# Patient Record
Sex: Female | Born: 1935 | Race: White | Hispanic: No | Smoking: Never smoker
Health system: Southern US, Community
[De-identification: ages and names within clinical notes are randomized; demographics above are authoritative.]

## PROBLEM LIST (undated history)

## (undated) DIAGNOSIS — G459 Transient cerebral ischemic attack, unspecified: Secondary | ICD-10-CM

## (undated) DIAGNOSIS — F32A Depression, unspecified: Secondary | ICD-10-CM

## (undated) DIAGNOSIS — R51 Headache: Secondary | ICD-10-CM

## (undated) DIAGNOSIS — H409 Unspecified glaucoma: Secondary | ICD-10-CM

## (undated) DIAGNOSIS — R413 Other amnesia: Secondary | ICD-10-CM

## (undated) DIAGNOSIS — K219 Gastro-esophageal reflux disease without esophagitis: Secondary | ICD-10-CM

## (undated) DIAGNOSIS — M1990 Unspecified osteoarthritis, unspecified site: Secondary | ICD-10-CM

## (undated) DIAGNOSIS — F419 Anxiety disorder, unspecified: Secondary | ICD-10-CM

## (undated) DIAGNOSIS — R269 Unspecified abnormalities of gait and mobility: Secondary | ICD-10-CM

## (undated) DIAGNOSIS — E669 Obesity, unspecified: Secondary | ICD-10-CM

## (undated) DIAGNOSIS — N809 Endometriosis, unspecified: Secondary | ICD-10-CM

## (undated) DIAGNOSIS — J45909 Unspecified asthma, uncomplicated: Secondary | ICD-10-CM

## (undated) DIAGNOSIS — H3530 Unspecified macular degeneration: Secondary | ICD-10-CM

## (undated) DIAGNOSIS — F329 Major depressive disorder, single episode, unspecified: Secondary | ICD-10-CM

## (undated) DIAGNOSIS — G20A1 Parkinson's disease without dyskinesia, without mention of fluctuations: Secondary | ICD-10-CM

## (undated) DIAGNOSIS — G20 Parkinson's disease: Secondary | ICD-10-CM

## (undated) DIAGNOSIS — I679 Cerebrovascular disease, unspecified: Secondary | ICD-10-CM

## (undated) DIAGNOSIS — I10 Essential (primary) hypertension: Secondary | ICD-10-CM

## (undated) DIAGNOSIS — E785 Hyperlipidemia, unspecified: Secondary | ICD-10-CM

## (undated) HISTORY — DX: Endometriosis, unspecified: N80.9

## (undated) HISTORY — DX: Unspecified glaucoma: H40.9

## (undated) HISTORY — DX: Parkinson's disease: G20

## (undated) HISTORY — PX: ABDOMINAL HYSTERECTOMY: SHX81

## (undated) HISTORY — DX: Major depressive disorder, single episode, unspecified: F32.9

## (undated) HISTORY — PX: CATARACT EXTRACTION, BILATERAL: SHX1313

## (undated) HISTORY — DX: Unspecified macular degeneration: H35.30

## (undated) HISTORY — PX: GALLBLADDER SURGERY: SHX652

## (undated) HISTORY — DX: Cerebrovascular disease, unspecified: I67.9

## (undated) HISTORY — DX: Hyperlipidemia, unspecified: E78.5

## (undated) HISTORY — DX: Gastro-esophageal reflux disease without esophagitis: K21.9

## (undated) HISTORY — DX: Obesity, unspecified: E66.9

## (undated) HISTORY — DX: Other amnesia: R41.3

## (undated) HISTORY — DX: Parkinson's disease without dyskinesia, without mention of fluctuations: G20.A1

## (undated) HISTORY — DX: Unspecified abnormalities of gait and mobility: R26.9

## (undated) HISTORY — PX: OTHER SURGICAL HISTORY: SHX169

## (undated) HISTORY — DX: Depression, unspecified: F32.A

## (undated) HISTORY — PX: TONSILLECTOMY: SUR1361

## (undated) HISTORY — DX: Transient cerebral ischemic attack, unspecified: G45.9

---

## 1998-10-04 ENCOUNTER — Ambulatory Visit (HOSPITAL_COMMUNITY): Admission: RE | Admit: 1998-10-04 | Discharge: 1998-10-04 | Payer: Self-pay | Admitting: Internal Medicine

## 1998-10-04 ENCOUNTER — Encounter: Payer: Self-pay | Admitting: Internal Medicine

## 1998-10-11 ENCOUNTER — Encounter: Payer: Self-pay | Admitting: Surgery

## 1998-10-11 ENCOUNTER — Ambulatory Visit (HOSPITAL_COMMUNITY): Admission: RE | Admit: 1998-10-11 | Discharge: 1998-10-11 | Payer: Self-pay | Admitting: Surgery

## 1998-10-14 ENCOUNTER — Encounter: Payer: Self-pay | Admitting: Surgery

## 1998-10-14 ENCOUNTER — Observation Stay (HOSPITAL_COMMUNITY): Admission: RE | Admit: 1998-10-14 | Discharge: 1998-10-15 | Payer: Self-pay | Admitting: Surgery

## 1998-11-01 ENCOUNTER — Emergency Department (HOSPITAL_COMMUNITY): Admission: EM | Admit: 1998-11-01 | Discharge: 1998-11-01 | Payer: Self-pay | Admitting: Emergency Medicine

## 2000-08-04 ENCOUNTER — Encounter: Payer: Self-pay | Admitting: Internal Medicine

## 2000-08-04 ENCOUNTER — Ambulatory Visit (HOSPITAL_COMMUNITY): Admission: RE | Admit: 2000-08-04 | Discharge: 2000-08-04 | Payer: Self-pay | Admitting: Internal Medicine

## 2000-11-11 ENCOUNTER — Ambulatory Visit (HOSPITAL_COMMUNITY): Admission: RE | Admit: 2000-11-11 | Discharge: 2000-11-11 | Payer: Self-pay | Admitting: Chiropractic Medicine

## 2000-11-11 ENCOUNTER — Encounter: Payer: Self-pay | Admitting: Chiropractic Medicine

## 2001-01-05 ENCOUNTER — Encounter: Payer: Self-pay | Admitting: Neurosurgery

## 2001-01-05 ENCOUNTER — Ambulatory Visit (HOSPITAL_COMMUNITY): Admission: RE | Admit: 2001-01-05 | Discharge: 2001-01-05 | Payer: Self-pay | Admitting: Neurosurgery

## 2001-01-19 ENCOUNTER — Ambulatory Visit (HOSPITAL_COMMUNITY): Admission: RE | Admit: 2001-01-19 | Discharge: 2001-01-19 | Payer: Self-pay | Admitting: Neurosurgery

## 2001-01-19 ENCOUNTER — Encounter: Payer: Self-pay | Admitting: Neurosurgery

## 2001-02-25 ENCOUNTER — Encounter (HOSPITAL_COMMUNITY): Admission: RE | Admit: 2001-02-25 | Discharge: 2001-05-26 | Payer: Self-pay | Admitting: Internal Medicine

## 2001-07-22 ENCOUNTER — Encounter: Admission: RE | Admit: 2001-07-22 | Discharge: 2001-07-22 | Payer: Self-pay | Admitting: Internal Medicine

## 2001-07-22 ENCOUNTER — Encounter: Payer: Self-pay | Admitting: Internal Medicine

## 2001-08-05 ENCOUNTER — Encounter: Payer: Self-pay | Admitting: Internal Medicine

## 2001-08-05 ENCOUNTER — Ambulatory Visit (HOSPITAL_COMMUNITY): Admission: RE | Admit: 2001-08-05 | Discharge: 2001-08-05 | Payer: Self-pay | Admitting: Internal Medicine

## 2001-12-13 ENCOUNTER — Encounter: Payer: Self-pay | Admitting: Internal Medicine

## 2001-12-13 ENCOUNTER — Encounter: Admission: RE | Admit: 2001-12-13 | Discharge: 2001-12-13 | Payer: Self-pay | Admitting: Internal Medicine

## 2002-06-15 ENCOUNTER — Ambulatory Visit (HOSPITAL_COMMUNITY): Admission: RE | Admit: 2002-06-15 | Discharge: 2002-06-15 | Payer: Self-pay | Admitting: Internal Medicine

## 2002-06-15 ENCOUNTER — Encounter: Payer: Self-pay | Admitting: Internal Medicine

## 2003-10-29 ENCOUNTER — Ambulatory Visit (HOSPITAL_COMMUNITY): Admission: RE | Admit: 2003-10-29 | Discharge: 2003-10-29 | Payer: Self-pay | Admitting: Internal Medicine

## 2003-11-06 ENCOUNTER — Encounter: Admission: RE | Admit: 2003-11-06 | Discharge: 2003-11-06 | Payer: Self-pay | Admitting: Internal Medicine

## 2003-11-15 ENCOUNTER — Ambulatory Visit (HOSPITAL_COMMUNITY): Admission: RE | Admit: 2003-11-15 | Discharge: 2003-11-15 | Payer: Self-pay | Admitting: Internal Medicine

## 2004-04-10 ENCOUNTER — Inpatient Hospital Stay (HOSPITAL_COMMUNITY): Admission: EM | Admit: 2004-04-10 | Discharge: 2004-04-14 | Payer: Self-pay | Admitting: Emergency Medicine

## 2004-04-14 ENCOUNTER — Inpatient Hospital Stay (HOSPITAL_COMMUNITY): Admission: RE | Admit: 2004-04-14 | Discharge: 2004-04-18 | Payer: Self-pay | Admitting: Psychiatry

## 2004-04-21 ENCOUNTER — Other Ambulatory Visit (HOSPITAL_COMMUNITY): Admission: RE | Admit: 2004-04-21 | Discharge: 2004-04-21 | Payer: Self-pay | Admitting: Psychiatry

## 2004-10-15 ENCOUNTER — Ambulatory Visit (HOSPITAL_COMMUNITY): Admission: RE | Admit: 2004-10-15 | Discharge: 2004-10-15 | Payer: Self-pay | Admitting: Internal Medicine

## 2004-10-18 ENCOUNTER — Inpatient Hospital Stay (HOSPITAL_COMMUNITY): Admission: EM | Admit: 2004-10-18 | Discharge: 2004-10-20 | Payer: Self-pay

## 2004-12-10 ENCOUNTER — Ambulatory Visit (HOSPITAL_COMMUNITY): Admission: RE | Admit: 2004-12-10 | Discharge: 2004-12-10 | Payer: Self-pay | Admitting: Internal Medicine

## 2005-02-09 ENCOUNTER — Emergency Department (HOSPITAL_COMMUNITY): Admission: EM | Admit: 2005-02-09 | Discharge: 2005-02-09 | Payer: Self-pay | Admitting: Emergency Medicine

## 2005-07-08 ENCOUNTER — Encounter: Admission: RE | Admit: 2005-07-08 | Discharge: 2005-10-06 | Payer: Self-pay | Admitting: Neurology

## 2005-07-16 ENCOUNTER — Ambulatory Visit (HOSPITAL_COMMUNITY): Admission: RE | Admit: 2005-07-16 | Discharge: 2005-07-16 | Payer: Self-pay | Admitting: Internal Medicine

## 2005-10-05 ENCOUNTER — Encounter: Admission: RE | Admit: 2005-10-05 | Discharge: 2005-10-05 | Payer: Self-pay | Admitting: Neurology

## 2006-03-01 ENCOUNTER — Emergency Department (HOSPITAL_COMMUNITY): Admission: EM | Admit: 2006-03-01 | Discharge: 2006-03-01 | Payer: Self-pay | Admitting: Emergency Medicine

## 2006-11-12 ENCOUNTER — Ambulatory Visit (HOSPITAL_COMMUNITY): Admission: RE | Admit: 2006-11-12 | Discharge: 2006-11-12 | Payer: Self-pay | Admitting: Internal Medicine

## 2007-06-04 ENCOUNTER — Encounter: Admission: RE | Admit: 2007-06-04 | Discharge: 2007-06-04 | Payer: Self-pay | Admitting: Neurology

## 2007-12-09 ENCOUNTER — Ambulatory Visit (HOSPITAL_COMMUNITY): Admission: RE | Admit: 2007-12-09 | Discharge: 2007-12-09 | Payer: Self-pay | Admitting: Family Medicine

## 2008-03-03 ENCOUNTER — Emergency Department (HOSPITAL_COMMUNITY): Admission: EM | Admit: 2008-03-03 | Discharge: 2008-03-03 | Payer: Self-pay | Admitting: Emergency Medicine

## 2008-12-19 ENCOUNTER — Ambulatory Visit (HOSPITAL_COMMUNITY): Admission: RE | Admit: 2008-12-19 | Discharge: 2008-12-19 | Payer: Self-pay | Admitting: Family Medicine

## 2010-01-12 ENCOUNTER — Emergency Department (HOSPITAL_COMMUNITY): Admission: EM | Admit: 2010-01-12 | Discharge: 2010-01-12 | Payer: Self-pay | Admitting: Emergency Medicine

## 2010-01-13 ENCOUNTER — Encounter: Admission: RE | Admit: 2010-01-13 | Discharge: 2010-01-13 | Payer: Self-pay | Admitting: Internal Medicine

## 2010-01-14 ENCOUNTER — Inpatient Hospital Stay (HOSPITAL_COMMUNITY): Admission: EM | Admit: 2010-01-14 | Discharge: 2010-01-17 | Payer: Self-pay | Admitting: Emergency Medicine

## 2010-01-15 ENCOUNTER — Encounter (INDEPENDENT_AMBULATORY_CARE_PROVIDER_SITE_OTHER): Payer: Self-pay | Admitting: Internal Medicine

## 2010-01-15 ENCOUNTER — Ambulatory Visit: Payer: Self-pay | Admitting: Vascular Surgery

## 2010-01-16 ENCOUNTER — Encounter (INDEPENDENT_AMBULATORY_CARE_PROVIDER_SITE_OTHER): Payer: Self-pay | Admitting: Internal Medicine

## 2010-11-22 ENCOUNTER — Encounter: Payer: Self-pay | Admitting: Internal Medicine

## 2010-11-23 ENCOUNTER — Encounter: Payer: Self-pay | Admitting: Internal Medicine

## 2011-01-25 LAB — CK TOTAL AND CKMB (NOT AT ARMC)
CK, MB: 2.6 ng/mL (ref 0.3–4.0)
Relative Index: 1.4 (ref 0.0–2.5)
Relative Index: 1.7 (ref 0.0–2.5)

## 2011-01-25 LAB — URINALYSIS, ROUTINE W REFLEX MICROSCOPIC
Bilirubin Urine: NEGATIVE
Leukocytes, UA: NEGATIVE
Nitrite: NEGATIVE
Specific Gravity, Urine: 1.02 (ref 1.005–1.030)
Urobilinogen, UA: 0.2 mg/dL (ref 0.0–1.0)

## 2011-01-25 LAB — PROTIME-INR
INR: 1.01 (ref 0.00–1.49)
Prothrombin Time: 13.2 seconds (ref 11.6–15.2)

## 2011-01-25 LAB — CBC
HCT: 34.7 % — ABNORMAL LOW (ref 36.0–46.0)
MCV: 92.3 fL (ref 78.0–100.0)
Platelets: 323 10*3/uL (ref 150–400)
Platelets: 343 10*3/uL (ref 150–400)
RBC: 3.9 MIL/uL (ref 3.87–5.11)
RDW: 13.5 % (ref 11.5–15.5)
RDW: 13.9 % (ref 11.5–15.5)
RDW: 13.9 % (ref 11.5–15.5)
WBC: 6.8 10*3/uL (ref 4.0–10.5)
WBC: 6.8 10*3/uL (ref 4.0–10.5)

## 2011-01-25 LAB — DIFFERENTIAL
Basophils Absolute: 0 10*3/uL (ref 0.0–0.1)
Basophils Relative: 1 % (ref 0–1)
Eosinophils Absolute: 0.2 10*3/uL (ref 0.0–0.7)
Eosinophils Relative: 3 % (ref 0–5)
Lymphs Abs: 1.7 10*3/uL (ref 0.7–4.0)
Monocytes Absolute: 0.3 10*3/uL (ref 0.1–1.0)
Monocytes Relative: 5 % (ref 3–12)

## 2011-01-25 LAB — COMPREHENSIVE METABOLIC PANEL
ALT: 20 U/L (ref 0–35)
ALT: 24 U/L (ref 0–35)
AST: 29 U/L (ref 0–37)
AST: 31 U/L (ref 0–37)
Albumin: 3.7 g/dL (ref 3.5–5.2)
BUN: 17 mg/dL (ref 6–23)
BUN: 21 mg/dL (ref 6–23)
Calcium: 9.7 mg/dL (ref 8.4–10.5)
Chloride: 106 mEq/L (ref 96–112)
Chloride: 107 mEq/L (ref 96–112)
Creatinine, Ser: 1.18 mg/dL (ref 0.4–1.2)
Creatinine, Ser: 1.23 mg/dL — ABNORMAL HIGH (ref 0.4–1.2)
Creatinine, Ser: 1.29 mg/dL — ABNORMAL HIGH (ref 0.4–1.2)
GFR calc non Af Amer: 40 mL/min — ABNORMAL LOW (ref >60)
GFR calc non Af Amer: 44 mL/min — ABNORMAL LOW (ref >60)
Glucose, Bld: 116 mg/dL — ABNORMAL HIGH (ref 70–99)
Potassium: 3.7 mEq/L (ref 3.5–5.1)
Potassium: 3.9 mEq/L (ref 3.5–5.1)
Potassium: 4.2 mEq/L (ref 3.5–5.1)
Sodium: 140 mEq/L (ref 135–145)
Sodium: 141 mEq/L (ref 135–145)
Total Bilirubin: 0.8 mg/dL (ref 0.3–1.2)
Total Protein: 6.4 g/dL (ref 6.0–8.3)
Total Protein: 6.7 g/dL (ref 6.0–8.3)

## 2011-01-25 LAB — TROPONIN I
Troponin I: 0.01 ng/mL (ref 0.00–0.06)
Troponin I: 0.01 ng/mL (ref 0.00–0.06)

## 2011-01-25 LAB — LIPID PANEL
LDL Cholesterol: 81 mg/dL (ref 0–99)
Total CHOL/HDL Ratio: 3.6 RATIO
Triglycerides: 108 mg/dL (ref <150)
VLDL: 22 mg/dL (ref 0–40)

## 2011-01-25 LAB — URINE CULTURE

## 2011-01-25 LAB — POCT CARDIAC MARKERS

## 2011-01-25 LAB — CARDIAC PANEL(CRET KIN+CKTOT+MB+TROPI)
Relative Index: 1.5 (ref 0.0–2.5)
Relative Index: 1.6 (ref 0.0–2.5)
Total CK: 131 U/L (ref 7–177)
Troponin I: 0.01 ng/mL (ref 0.00–0.06)

## 2011-01-25 LAB — BASIC METABOLIC PANEL
Chloride: 110 mEq/L (ref 96–112)
GFR calc Af Amer: >60 mL/min (ref >60)
Potassium: 3.4 mEq/L — ABNORMAL LOW (ref 3.5–5.1)

## 2011-01-25 LAB — URINE MICROSCOPIC-ADD ON

## 2011-01-25 LAB — APTT: aPTT: 23 seconds — ABNORMAL LOW (ref 24–37)

## 2011-03-02 ENCOUNTER — Other Ambulatory Visit (HOSPITAL_COMMUNITY): Payer: Self-pay | Admitting: Neurology

## 2011-03-02 ENCOUNTER — Other Ambulatory Visit (HOSPITAL_COMMUNITY): Payer: Self-pay

## 2011-03-02 DIAGNOSIS — R531 Weakness: Secondary | ICD-10-CM

## 2011-03-10 ENCOUNTER — Ambulatory Visit (HOSPITAL_COMMUNITY): Payer: Medicare Other | Attending: Neurology

## 2011-03-10 DIAGNOSIS — R5381 Other malaise: Secondary | ICD-10-CM | POA: Insufficient documentation

## 2011-03-10 DIAGNOSIS — G459 Transient cerebral ischemic attack, unspecified: Secondary | ICD-10-CM

## 2011-03-10 DIAGNOSIS — R531 Weakness: Secondary | ICD-10-CM

## 2011-03-11 ENCOUNTER — Encounter (HOSPITAL_COMMUNITY): Payer: Self-pay | Admitting: Neurology

## 2011-03-20 NOTE — Discharge Summary (Signed)
NAME:  [name], [name]                     ACCOUNT NO.:  [id number]   MEDICAL RECORD NO.:  [id number]                   PATIENT TYPE:  IPS   LOCATION:  0508                                 FACILITY:  [facility]   PHYSICIAN:  Geoffery Lyons, M.D.                   DATE OF BIRTH:  12/22/1929   DATE OF ADMISSION:  04/14/2004  DATE OF DISCHARGE:  04/18/2004                                 DISCHARGE SUMMARY   CHIEF COMPLAINT AND PRESENT ILLNESS:  This was the first admission to [facility] for this 77 year old married white female  voluntarily admitted.  She was transferred from [facility].  She spent five  days after intentional overdose on Xanax, her own pills, took 100.  She  stated that she was very impulsive, she acted out of stupidity.  She left  a note to family so I didn't screw up my life.  She has no specific  stressors.  She was hoping to be home with Jesus.  Husband found her.  There  were some financial worries.   PAST PSYCHIATRIC HISTORY:  This was the first time at [facility].  No other treatment.   SUBSTANCE ABUSE HISTORY:  She denied the use or abuse of any substances.   PAST MEDICAL HISTORY:  1. CVA.  2. Arterial hypertension.  3. Asthma.  4. Hypercholesterolemia.   MEDICATIONS:  1. Allegra 60 mg daily.  2. Aspirin 81 mg.  3. Clonidine 0.1 mg.  4. Combivent two puffs twice a day.  5. Lasix 20 mg daily.  6. Plavix 75 mg daily.  7. Zocor 80 mg daily.  8. Xanax 1 mg three times a day for five days.  9. Prilosec 40 mg per day.   PHYSICAL EXAMINATION:  Physical examination was performed, failed to show  any acute findings.   LABORATORY DATA:  Laboratory workup upon transfer was within normal limits.   MENTAL STATUS EXAM:  Mental status exam revealed an alert female,  cooperative.  Speech was clear.  Mood was feeling guilty, ashamed.  Affect  was sad.  Thought processes: Logical, coherent, relevant; no evidence of  delusional  ideas, no evidence of hallucinations, ashamed of what she did, no  active suicidal ideation.  Cognitive: Cognition was well preserved.   ADMISSION DIAGNOSES:   AXIS I:  Major depression.   AXIS II:  No diagnosis.   AXIS III:  1. Status post overdose.  2. Arterial hypertension.  3. Status post cerebral vascular accident.  4. Asthma.  5. Hypercholesterolemia.   AXIS IV:  Moderate.   AXIS V:  Global assessment of functioning upon admission 35, highest global  assessment of functioning in the last year 70.   HOSPITAL COURSE:  She was admitted and started in intensive individual and  group psychotherapy.  She was given Ambien for sleep.  She was maintained on  her medications.  She was  given some Librium 25 mg every six hours as  needed.  Ambien was discontinued and she was started on Lexapro 5 mg per  day.  She endorsed that she did not know why she took the overdose, could  not identify any one stressors.  She endorsed down mood, especially in the  afternoon, difficulty with sleep.  She was being given Xanax three times a  day.  She tried taking it for sleep every night.  She claimed that she would  take it a couple of times a day most of the days.  She felt that she  recovered fully after the CVA, mobility, etc., but she had experienced some  mood swings after the stroke.  She was started on Lexapro 5 mg per day.  She  was stomach sensitive, endorsing some anxiety, possibility of some  withdrawal was considered.  She was placed on taper from Librium.  Family  session she refused to attend.  She reported to be happy at times, depressed  at times, withdrawn at times, outgoing and quiet.  Apparently, the family  felt that she had been unstable and that she would not be compliant if she  did not pursue the Lexapro.  But by June 17, she was better, no further of  the GI symptoms, felt that she would not be progressing as well if she was  going to stay on the unit.  She was willing to  pursue outpatient treatment  but felt that staying on the unit was not going to help her out anymore.  She felt that she got a lot of help but it was time to go.  She endorsed no  suicidal ideas, no homicidal ideations, was willing to pursue further  outpatient treatment as well as possible medication changes.   DISCHARGE DIAGNOSES:   AXIS I:  Depressive disorder, not otherwise specified.   AXIS II:  No diagnosis.   AXIS III:  1. Status post benzodiazepine overdose.  2. Arterial hypertension.  3. Hypercholesterolemia.  4. Asthma.   AXIS IV:  Moderate.   AXIS V:  Global assessment of functioning upon discharge 50.   DISCHARGE MEDICATIONS:  1. Plavix 75 mg per day.  2. Protonix 40 mg per day.  3. Catapres 0.1 mg daily.  4. Lasix 20 mg daily.  5. Combivent inhaler two puffs three times a day.  6. Aspirin 81 mg daily.  7. Allegra 60 mg daily.  8. Zocor 40 mg two daily.  9. Librium taper.  10.      Phenergan 25 mg one half to one every six hours as needed.   FOLLOW UP:  She was to follow up with [facility].                                               Geoffery Lyons, M.D.    IL/MEDQ  D:  05/27/2004  T:  05/28/2004  Job:  [id number]

## 2011-03-20 NOTE — Discharge Summary (Signed)
NAME:  [name], [name]           ACCOUNT NO.:  [id number]   MEDICAL RECORD NO.:  [id number]          PATIENT TYPE:  INP   LOCATION:  3737                         FACILITY:  [facility]   PHYSICIAN:  Michaelyn Barter, M.D. DATE OF BIRTH:  June 21, 1930   DATE OF ADMISSION:  10/17/2004  DATE OF DISCHARGE:  10/20/2004                                 DISCHARGE SUMMARY   FINAL DIAGNOSES AT THE TIME OF DISCHARGE:  1.  Frequent falling.  2.  Hypertension.  3.  Cough.  4.  Sinus disease.   PRIMARY CARE PHYSICIAN:  Lucky Cowboy, M.D.   HISTORY OF PRESENT ILLNESS:  Ms. Sturgess is a 77 year old female who  arrived at the hospital with a chief complaint of multiple falls.  She went  on to state that she had been experiencing progressive weakness and falls  which had been increasing over the last two years, following a previous CVA  that she had experienced.  She otherwise had no complaints.  She denied  feeling weak.  No chest pain, no muscle pain, no numbness or weakness.   PAST MEDICAL HISTORY:  1.  CVA.  2.  Depression.  3.  History of asthma.  4.  Back pain - chronic.  5.  Hypercholesterolemia.  6.  Hypertension.  7.  Xanax overdose for which she ended up being admitted to [facility].  8.  Rib fracture secondary to a recent fall.   PAST SURGICAL HISTORY:  1.  Cholecystectomy.  2.  Hysterectomy.  3.  Cervical disk disease.   SOCIAL HISTORY:  Cigarettes:  The patient denied.  Alcohol:  The patient  denied.   FAMILY HISTORY:  Neurologic and cardiac diseases negative.   ALLERGIES:  No known drug allergies.   HOSPITAL COURSE:  1.  The patient was admitted to telemetry for further evaluation.  She      underwent a portable chest x-ray the results of which were 1.  Stable      cardiomegaly.  2.  Stable linear atelectasis or scarring, left lower      lung.  She also had a CT scan completed of the head with the following      impression was observed, 1.  Negative for  bleed or other acute      intracranial process.  2.  Atrophy and nonspecific white matter changes.      3.  Ethmoid and right maxillary sinus disease.  Following the CT scan of      the head, she underwent an MRI of the brain.  The results of the MRI      revealed no acute intracranial abnormalities.  Involutional changes as      described above.  Air/fluid level in the right maxillary sinus which may      be due to sinusitis or potentially a result of trauma, opacification of      the right lateral recess of the sphenoid sinus, mucosal thickening and      opacification of the multiple ethmoid sinus air cells, mild mucosal      thickening frontal sinus.  Likewise  an MRA of the brain was completed.      The final impression was overall unremarkable MRA of the brain, however,      note that there is suspicion for a very small aneurysm arising from the      proximal aspect of the right MCA branch.  Likewise the patient also had      cardiac enzymes completed.  Her troponin were 0.01, less than 0.01, and      the third troponin was less than 0.01.  Physical therapy evaluation was      placed for the evaluation of the falls.  It was believed that the      falling may have been secondary to weakness and/or deconditioning and      fall precautions were instituted.  Physical therapy recommended home or      outpatient physical therapy.  The patient was agreeable to this,      therefore, care management was consulted to help to arrange for this.  2.  Changes on CT consistent with chronic plus acute sinusitis.  The patient      subsequently was started on Augmentin for which she would take p.o.  3.  Hypertension.  The patient's blood pressure was monitored over the      course of her hospitalization and it remained stable over the course of      her hospitalization.  4.  Cough.  The patient had a repeat chest x-ray completed, on October 19, 2004, which revealed cardiomegaly, resolving left base  atelectasis.  She      was continued on her antibiotics.  On October 20, 2004, the patient      stated that she felt better and said she would like to go home today.      Her vitals at that time were a temperature of 98.5, heart rate of 86,      respirations of 20, blood pressure 152/88, and she was sating 97% on      room air.  The decision was made to discharge the patient home.   The patient was discharged home on the following medications:  1.  Augmentin 875 mg one tablet twice a day.  2.  Combivent MDI two puffs q.6h. for shortness of breath.  3.  Aspirin 81 mg one tablet every day.  4.  Wellbutrin 150 mg one tablet every 12 hours.  5.  Catapres 0.1 mg one tablet twice a day for high blood pressure.  6.  Depakote 1,000 mg one tablet once a day.  7.  Lasix 40 mg one tablet once a day for high blood pressure.  8.  Quinapril 40 mg one tablet once a day.  9.  Claritin 10 mg one tablet once a day.  10. Zocor 40 mg one tablet once a day.  11. Coumadin 5 mg one tablet once a day.  12. Xanax 1 mg one p.o. once a day.  She was instructed to take all of her medications as prescribed.   She was told to not walk without assistance, i.e., a cane or walker or other  person and to follow up with her primary care physician within 1-2 weeks.      OR/MEDQ  D:  01/09/2005  T:  01/10/2005  Job:  [id number]   cc:   Lucky Cowboy, M.D.  [address], [address]  [place], [place] [address]  Fax: [phone]

## 2011-03-20 NOTE — H&P (Signed)
NAME:  [name], [name]                     ACCOUNT NO.:  [id number]   MEDICAL RECORD NO.:  [id number]                   PATIENT TYPE:  INP   LOCATION:  2112                                 FACILITY:  [facility]   PHYSICIAN:  Burnice Logan, M.D.               DATE OF BIRTH:  Mar 16, 1930   DATE OF ADMISSION:  04/10/2004  DATE OF DISCHARGE:                                HISTORY & PHYSICAL   PRIMARY CARE PHYSICIAN:  Lucky Cowboy, M.D.   CHIEF COMPLAINT:  Brought in unresponsive.   HISTORY OF PRESENT ILLNESS:  The patient is a 77 year old Caucasian lady who  allegedly overdosed on Xanax.  She was brought in unresponsive to the  emergency room.  She was breathing spontaneously and maintaining good O2  saturation on room air.  She received Narcan in the emergency room and  seemed to wake up a little bit but remains very somnolent and unable to give  any medical history.  Details obtained from her current ED chart.  A review  of her computer records are negative for any previous transcription notes.   The patient apparently has become increasingly depressed lately, and she had  a [place] note, a copy of which is in her current ED chart.  Again, she  wakes up for brief moments and drifts back to sleep and does not give any  meaningful history.   PAST MEDICAL HISTORY PER ED CHART:  1. CVA.  2. Depression.  3. History of asthma.   CURRENT MEDICATIONS:  Per medication list in her chart:  1. Allegra 60 mg.  2. Plavix 75 mg.  3. Combivent.  4. Singulair 10 mg.  5. Zocor 40 mg.  6. Xanax 0.25 mg.  7. Prilosec 20 mg.  8. Clonidine.  9. Furosemide 40 mg.  10.      Aspirin 81 mg.   ALLERGIES:  No allergies are listed.   FAMILY HISTORY, SOCIAL HISTORY, REVIEW OF SYSTEMS:  Details unavailable.   PHYSICAL EXAMINATION:  GENERAL:  Obese Caucasian lady who appears  comfortable with spontaneous unlabored breathing and good O2 saturation.  VITAL SIGNS: Temperature 97, blood pressure  125/65, heart rate 66,  respiratory rate 18 to 20 per minute.  HEENT:  Normocephalic with no facial asymmetry.  She has round, equal size,  reactive pupils.  Conjunctivae pink.  Mucous membranes are moist.  Oropharynx unremarkable.  NECK:  Obese without adenopathy or thyromegaly.  LUNGS:  Good bilateral air movement, clear, with no added sounds.  CARDIOVASCULAR:  Heart sounds 1 and 2 regular, no murmur.  ABDOMEN:  Obese, soft, nontender with few bowel sounds heard in all  quadrants.  CNS:  Somnolent, arousable for only brief periods.  Withdraws extremities to  noxious stimuli.  EXTREMITIES:  No edema.   LABORATORY AND X-RAY DATA:  Hemoglobin 14.  Potassium 3.3, sodium 136, BUN  21.  The pH is 7.37 with PCO2 of 44.  O2 saturation on  room air 97%.  Urine  drug screen was positive for benzodiazepines.   IMPRESSION:  Benzodiazepine overdose.   PLAN:  1. The patient will be admitted to a monitored bed.  2. [place] as well as seizure precautions will be observed.  3. Will replete her marginally low potassium and hydrate her with IV fluids.  4. Once she is more awake, will allow liquids by mouth and advance as     tolerated.  5. Will consult psychiatry for management of depression and suicidal     tendencies once she is medically stable.  6. Will also request any medical records from Dr. Kathryne Sharper office to     enhance the database.                                                Burnice Logan, M.D.    ES/MEDQ  D:  04/10/2004  T:  04/11/2004  Job:  [id number]   cc:   Lucky Cowboy, M.D.  [address]  [place], [place] [address]  Fax: [phone]

## 2011-03-20 NOTE — Discharge Summary (Signed)
NAME:  [name], [name]                     ACCOUNT NO.:  [id number]   MEDICAL RECORD NO.:  [id number]                   PATIENT TYPE:  INP   LOCATION:  3702                                 FACILITY:  [facility]   PHYSICIAN:  Elliot Cousin, M.D.                 DATE OF BIRTH:  [birth year]   DATE OF ADMISSION:  04/10/2004  DATE OF DISCHARGE:  04/14/2004                           DISCHARGE SUMMARY - REFERRING   DISCHARGE DIAGNOSES:  1. Intentional drug overdose, secondary to Xanax     a. Suicide attempt.  2. Major depression.  3. Transient left-sided weakness with a history of right brain stroke in     June 2004.  4. Low back pain.  5. Hypertension.  6. Asthma.  7. Chronic peripheral edema.  8. Mild anxiety.  9. Gastroesophageal reflux disease.  10.      Obesity.   DISCHARGE MEDICATIONS:  1. Allegra 60 mg daily.  2. Plavix 75 mg daily.  3. Aspirin 81 mg daily.  4. Combivent MDI, two puffs q.4h. p.r.n.  5. Singulair 10 mg q.h.s.  6. Zocor 40 mg daily.  7. Prilosec 20 mg daily.  8. Clonidine 0.1 mg daily.  9. Furosemide 40 mg, 1/2 tab to 1 tab daily.  10.      Potassium chloride 20 mEq, 1/2 tab to 1 tab daily.   DISPOSITION:  The patient will be discharged to the [facility].  The receiving physician is Dr.  Jeanice Lim.  The patient is in an improved and stable condition  medically.   PROCEDURE PERFORMED:  1. MRI and MRA of the brain.  The results revealed mild chronic small vessel     changes.  No sign of acute stroke.  No signs of a reversible process.     The MRA revealed normal intracranial MR angiography of the large and     medium-sized vessels.  2. A CT scan of the head without contrast:  No evidence of acute     intracranial abnormality.  Mild cerebral atrophy and small vessel disease     chronically.   CONSULTATIONS:  Dr. Antonietta Breach.   HISTORY OF PRESENT ILLNESS:  The patient is a 76 year old lady with a  past  medical history significant for depression, anxiety and stroke, who  presented to the emergency room on April 10, 2004, after being found by her  husband.  The husband provided the history.  The patient's husband stated  that he went out to the store and was gone approximately one hour or two.  When he came back to the house, the patient was found unresponsive on the  floor.  She was breathing spontaneously.  When the patient arrived to the  emergency room she was given Narcan.  The Narcan appeared to decrease the  patient's somnolence just only a little.  Per the husband's account, the  patient had been depressed.  She  apparently had taken approximately #100  tablets of 1 mg Xanax.  The patient also left a suicide note which read I  love you very much all my life.  I have been a screw up.  Go on with your  life.  Remember how much I love you.  Mom   HOSPITAL COURSE:  #1 - INTENTIONAL DRUG OVERDOSE, SECONDARY TO XANAX/SUICIDE  ATTEMPT:  The initial management included supportive care with IV fluids,  oxygen therapy and neuro checks every four hours.  The patient was admitted  to the intensive care unit and a 24-hour sitter was placed in her room  throughout the hospital course.  The patient was lethargic during the first  24 hours of hospitalization.  At the time of admission the patient was able  to maintain her airway.  She was breathing spontaneously.  She was  hemodynamically stable on admission, and throughout the hospital course.  Her vital signs on admission revealed a temperature of 97 degrees, blood  pressure 125/65, heart rate 66, respirations between 18-20 per minute.  The  patient's CBC was within normal limits, with a WBC of 8.0, hemoglobin 13.8,  hematocrit 40.6, platelet count 413.  The basic metabolic panel was within  normal limits as well with a sodium of 141, potassium 3.5, chloride 106, CO2  of 26, glucose 95, BUN 17, creatinine 1.0, calcium 9.3.  For further   evaluation in the initial 24 hours of hospitalization, a CT scan of the head  was ordered.  The CT scan of the head was negative for any acute findings.  A salicylate level was ordered and found to be less than 4.  An  acetaminophen level was less than 10.  The tricyclic level was not detected.  The urine drug screen was positive for benzodiazepines only.  On the following morning the patient became much more alert.  She was  conversant.  She was oriented to herself and to the hospital; however, she  was not quite oriented to the month, the day and the year.  Throughout the  day on hospital day number two, she became much more alert, enough to eat;  however, the 24-hour sitter was still ordered for monitoring.  A psychiatric  consultation was ordered.  Dr. Jeanie Sewer provided the psychiatric  consultation.  Dr. Jeanie Sewer recommended inpatient treatment for depression  and the attempted suicide.  The patient agreed with voluntary admission to  [facility]. [facility].  Dr. Jeanie Sewer  deferred treatment with an antidepressant until the patient was further  evaluated at the [facility]. [facility].  At the time of discharge the patient was alert and oriented x3.  She was  quite appropriate.  She did have a flat affect initially; however, on the  day of discharge she had somewhat of a pleasant more appropriate affect.  She continued to agree with inpatient treatment for depression.  The patient  will be transferred to the [facility] today.  The accepting  physician will be Dr. Jeanice Lim.  The patient is medically ready for  discharge.  #2 - TRANSIENT LET-SIDED WEAKNESS:  When the patient finally became more  alert, she complained of acute left-sided weakness.  The patient had no  other neurological deficits or findings.  For further evaluation, an MRI and MRA of the brain were ordered.  The MRA was completely  negative for any type  of atherosclerotic or stenotic disease in the  large vessels.  The MRA was  negative for any acute or reversible process.  There was evidence of mild  chronic small vessel changes.  The patient has a history of an acute stroke  in June 2004.  She received therapy, and the left-sided weakness totally  resolved.  The patient did mention several times during the hospital course  that June was the anniversary of her stroke.  After the patient was informed  that the MRI and the MRA were negative, the left-sided weakness resolved.  The etiology of the patient's left-sided weakness is unclear; however, it  may have been psychosomatic in origin.  The patient was maintained on Plavix  75 mg daily during her hospital course.  The patient was maintained on  Plavix 75 mg daily during her hospital course.  #3 - ACUTE ON CHRONIC LOW BACK PAIN:  The patient complained of mild to  moderate low back pain during the hospital course.  The patient felt that it  was exacerbated by the hospital bed.  The patient does have chronic low back  pain which she treats with as-needed Tylenol and ibuprofen.  The patient was  treated with Tylenol and with ibuprofen during the hospital course.  She was  also treated with a heating pad.  Both therapies provided  good results, and the patient was free of back pain at the time of hospital  discharge.  #4 - HYPERTENSION:  The patient was maintained on clonidine 0.1 mg daily.  Her blood pressures were well-controlled during the hospital course.                                                Elliot Cousin, M.D.    DF/MEDQ  D:  04/14/2004  T:  04/14/2004  Job:  [id number]   cc:   Lucky Cowboy, M.D.  [address]  [place], [place] [address]  Fax: [phone]

## 2011-03-20 NOTE — H&P (Signed)
NAME:  [name], [name]           ACCOUNT NO.:  [id number]   MEDICAL RECORD NO.:  [id number]          PATIENT TYPE:  INP   LOCATION:  3737                         FACILITY:  [facility]   PHYSICIAN:  Renato Battles, M.D.     DATE OF BIRTH:  [birth year]   DATE OF ADMISSION:  10/17/2004  DATE OF DISCHARGE:                                HISTORY & PHYSICAL   REASON FOR ADMISSION:  Multiple falls.   HISTORY OF PRESENT ILLNESS:  The patient is a very pleasant, 77 year old,  white female who had progressive weakness and multiple falls increasing for  the last two years since her CVA from which she recovered fully.  Her  husband reported that for the last few weeks this problem has gotten so bad  that she can not safely get out of a chair without help.  The patient  presented to the emergency room for evaluation for any medical reason for  this deterioration.  The patient at this time reports no complaints.  She  says she does not feel weak but does admit to having the falls.  She denies  chest pain, denies muscle pain, denies any numbness or weakness.   PAST MEDICAL HISTORY:  1.  CVA.  2.  Depression.  3.  History of asthma.  4.  Back pain, chronic.  5.  Hypercholesterolemia.  6.  Hypertension.  7.  History of Xanax overdose for which she was admitted to [facility] and transferred to [facility], however, she was      discharged from [facility] on Xanax.  8.  Rib fracture secondary to a recent fall.   PAST SURGICAL HISTORY:  1.  Cholecystectomy.  2.  Hysterectomy.  3.  Cervical disk surgery.   SOCIAL HISTORY:  The patient does not smoke or drink.  She denies using  drugs.  She lives with her husband.  She is retired.   FAMILY HISTORY:  Negative for neurologic and cardiologic diseases.   ALLERGIES:  No known drug allergies.   CURRENT MEDICATIONS:  1.  Claritin 10 mg p.o. every day.  2.  Aspirin 81 mg p.o. every day.  3.  Clonidine 0.1 mg p.o. b.i.d.  4.   Wellbutrin SR 150 mg p.o. b.i.d.  5.  Combivent.  6.  Lasix 40 mg p.o. every day.  7.  Depakote 1,000 mg p.o. q.h.s.  8.  Zocor 40 mg p.o. every day.  9.  Quinapril 40 mg p.o. every day.  10. Xanax 1 mg p.o. b.i.d.  11. Sonata 10 mg p.o. q.h.s.  12. Warfarin 5 mg p.o. every day.  13. Vicodin 5/500 tablets, take 1-2 tablets p.o. q.4h. p.r.n.   PHYSICAL EXAMINATION:  GENERAL:  The patient is alert and oriented x 3.  No  distress.  VITAL SIGNS:  Temperature 98.5, heart rate 101, respiratory rate 24, blood  pressure 130/42, O2 sat 97% on room air.  HEENT:  Atraumatic and normocephalic.  Pupils equal, round, reactive to  light and accommodation.  Extraocular movements intact bilaterally.  NECK:  No lymphadenopathy.  No thyromegaly.  No JVD.  No carotid bruits.  CHEST:  Positive for bilateral crackles and rhonchi.  However, they mostly  cleared after multiple coughs and chest became clear.  HEART:  Regular rhythm.  Tachycardic.  No murmurs.  ABDOMEN:  Soft, nontender, nondistended.  Normoactive bowel sounds.  EXTREMITIES:  Positive for lower extremity edema bilaterally.  NEUROLOGIC:  Grossly within normal limits.  The patient was observed  walking.  She uses small steps but no ataxia.  No tendency to fall to one  side or the other.   STUDIES:  UA was negative.  Cardiac enzymes, in the emergency room, were  negative.  Valproic acid was therapeutic at 67.  Electrolytes were within  normal, except for marginally low sodium at 132.  Liver functions showed low  albumin of 3.3, otherwise normal.  CBC showed a normal white count and  platelets, hemoglobin was 11.2 with normal MCV.   Head CT showed sinus disease but no acute changes.   IMPRESSION:  1.  Weakness.  2.  Frequent falls.  3.  Hypertension.  4.  Depression.  5.  Hypercholesterolemia.  6.  Sinus disease.  7.  Multiple medications.   PLAN:  1.  Rule out MI with cardiac enzymes.  2.  Admit to telemetry to rule out  arrhythmia.  3.  Check head MRI, rule out neurologic deficits.  4.  Check TSH and vitamin B-12 to rule out causes for dementia.  5.  Check magnesium and phosphorous and CK.  6.  Hold Lasix.  7.  Adjust medications, decrease as much as possible.       SA/MEDQ  D:  10/18/2004  T:  10/18/2004  Job:  [id number]   cc:   Lucky Cowboy, M.D.  [address]  [place], [place] [address]  Fax: [phone]
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
## 2013-03-14 ENCOUNTER — Telehealth: Payer: Self-pay | Admitting: Neurology

## 2013-03-14 NOTE — Telephone Encounter (Signed)
error 

## 2013-08-24 ENCOUNTER — Telehealth: Payer: Self-pay | Admitting: Cardiology

## 2013-08-24 NOTE — Telephone Encounter (Signed)
New problem  Patients daughter would like a call back regarding medication ( Lasix ). Her father claims he was instructed to quit taking the Lasix and she would like clarification.

## 2013-08-25 ENCOUNTER — Encounter: Payer: Self-pay | Admitting: Neurology

## 2013-08-31 ENCOUNTER — Ambulatory Visit (INDEPENDENT_AMBULATORY_CARE_PROVIDER_SITE_OTHER): Payer: Medicare Other | Admitting: Neurology

## 2013-08-31 ENCOUNTER — Encounter: Payer: Self-pay | Admitting: Neurology

## 2013-08-31 VITALS — BP 146/70 | HR 82 | Wt 190.0 lb

## 2013-08-31 DIAGNOSIS — G20A1 Parkinson's disease without dyskinesia, without mention of fluctuations: Secondary | ICD-10-CM

## 2013-08-31 DIAGNOSIS — R42 Dizziness and giddiness: Secondary | ICD-10-CM

## 2013-08-31 DIAGNOSIS — G20 Parkinson's disease: Secondary | ICD-10-CM

## 2013-08-31 HISTORY — DX: Parkinson's disease: G20

## 2013-08-31 HISTORY — DX: Parkinson's disease without dyskinesia, without mention of fluctuations: G20.A1

## 2013-08-31 NOTE — Patient Instructions (Signed)
Parkinson's Disease Parkinson's disease is a disorder of the central nervous system, which includes the brain and spinal cord. A person with this disease slowly loses the ability to completely control body movements. Within the brain, there is a group of nerve cells (basal ganglia) that help control movement. The basal ganglia are damaged and do not work properly in a person with Parkinson's disease. In addition, the basal ganglia produce and use a brain chemical called dopamine. The dopamine chemical sends messages to other parts of the body to control and coordinate body movements. Dopamine levels are low in a person with Parkinson's disease. If the dopamine levels are low, then the body does not receive the correct messages it needs to move normally.  CAUSES  The exact reason why the basal ganglia get damaged is not known. Some medical researchers have thought that infection, genes, environment, and certain medicines may contribute to the cause.  SYMPTOMS   An early symptom of Parkinson's disease is often an uncontrolled shaking (tremor) of the hands. The tremor will often disappear when the affected hand is consciously used.  As the disease progresses, walking, talking, getting out of a chair, and new movements become more difficult.  Muscles get stiff and movements become slower.  Balance and coordination become harder.  Depression, trouble swallowing, urinary problems, constipation, and sleep problems can occur.  Later in the disease, memory and thought processes may deteriorate. DIAGNOSIS  There are no specific tests to diagnose Parkinson's disease. You may be referred to a neurologist for evaluation. Your caregiver will ask about your medical history, symptoms, and perform a physical exam. Blood tests and imaging tests of your brain may be performed to rule out other diseases. The imaging tests may include an MRI or a CT scan. TREATMENT  The goal of treatment is to relieve symptoms.  Medicines may be prescribed once the symptoms become troublesome. Medicine will not stop the progression of the disease, but medicine can make movement and balance better and help control tremors. Speech and occupational therapy may also be prescribed. Sometimes, surgical treatment of the brain can be done in young people. HOME CARE INSTRUCTIONS  Get regular exercise and rest periods during the day to help prevent exhaustion and depression.  If getting dressed becomes difficult, replace buttons and zippers with Velcro and elastic on your clothing.  Take all medicine as directed by your caregiver.  Install grab bars or railings in your home to prevent falls.  Go to speech or occupational therapy as directed.  Keep all follow-up visits as directed by your caregiver. SEEK MEDICAL CARE IF:  Your symptoms are not controlled with your medicine.  You fall.  You have trouble swallowing or choke on your food. MAKE SURE YOU:  Understand these instructions.  Will watch your condition.  Will get help right away if you are not doing well or get worse. Document Released: 10/16/2000 Document Revised: 04/19/2012 Document Reviewed: 11/18/2011 [employer] Patient Information 2014 [employer].  

## 2013-08-31 NOTE — Progress Notes (Signed)
Reason for visit: Parkinson's disease  [name] is an 79 y.o. female  History of present illness:  [name] is an 79 year old right-handed white female with a history of Parkinson's disease. The patient has remained on Sinemet taking the 25/250 mg tablets, one tablet 3 times daily. The patient has not been seen through this office since 2012. The patient is using a walker for ambulation, and she indicates that she has been using this for about 3 years. The patient denies any recent falls. The patient indicates that one year ago, she began having episodes of onset of dizziness that will come on with standing only, never with sitting or lying down. The patient has a lightheaded, floating sensation, with the sensation of near-syncope. The patient denies any chest pain or palpitations of the heart. The patient feels as if the legs will collapse out from under her, and she will go and sit down. The patient then notes that the sensation goes down her body into her legs, with discomfort in the legs. This will last for about 1 hour, and then she will have some mild left-sided weakness that persists for several days affecting the left arm and leg. The patient denies any numbness with the events. The patient does have slurred speech during the events, but she denies any headaches. All events are stereotypic, with the last event occurring 2 weeks ago. The patient has had 3 events within the last 6 months. The patient denies any jerking of the arms or legs, or problems controlling the bowels or the bladder. The patient denies severe neck or low back pain. The patient is sent to this office for an evaluation.  Past Medical History  Diagnosis Date  . Parkinson disease   . Cerebrovascular disease   . TIA (transient ischemic attack)   . Depression   . Dyslipidemia   . Glaucoma   . GERD (gastroesophageal reflux disease)   . Obesity   . Endometriosis   . Paralysis agitans 08/31/2013  . Macular  degeneration     bilateral    Past Surgical History  Procedure Laterality Date  . Gallbladder surgery    . Abdominal hysterectomy    . Tonsillectomy    . Cataract extraction, bilateral    . Pilonidal cyst resection      Family History  Problem Relation Age of Onset  . Arthritis Brother   . Skin cancer Brother     Social history:  reports that she has never smoked. She has never used smokeless tobacco. She reports that she does not drink alcohol or use illicit drugs.    Allergies  Allergen Reactions  . Comtan [Entacapone]   . Mirapex [Pramipexole Dihydrochloride]   . Sulfa Antibiotics     Medications:  Current Outpatient Prescriptions on File Prior to Visit  Medication Sig Dispense Refill  . ALPRAZolam (XANAX) 0.5 MG tablet Take 0.5 mg by mouth 3 (three) times daily as needed for sleep.      Marland Kitchen aspirin 81 MG tablet Take 81 mg by mouth daily.      . budesonide-formoterol (SYMBICORT) 80-4.5 MCG/ACT inhaler Inhale 2 puffs into the lungs 2 (two) times daily.      . clopidogrel (PLAVIX) 75 MG tablet Take 75 mg by mouth daily.      Marland Kitchen dicyclomine (BENTYL) 20 MG tablet Take 20 mg by mouth every 6 (six) hours.      . enalapril (VASOTEC) 10 MG tablet Take 10 mg by mouth daily.      Marland Kitchen  fenofibrate 160 MG tablet Take 160 mg by mouth daily.      . fluticasone (FLONASE) 50 MCG/ACT nasal spray Place 2 sprays into the nose daily.      . folic acid-vitamin b complex-vitamin c-selenium-zinc (DIALYVITE) 3 MG TABS tablet Take 1 tablet by mouth daily.      . Multiple Vitamin (MULTIVITAMIN) tablet Take 1 tablet by mouth daily.      . pantoprazole (PROTONIX) 40 MG tablet Take 40 mg by mouth daily.      . travoprost, benzalkonium, (TRAVATAN) 0.004 % ophthalmic solution Place 1 drop into both eyes at bedtime.       No current facility-administered medications on file prior to visit.    ROS:  Out of a complete 14 system review of symptoms, the patient complains only of the following symptoms,  and all other reviewed systems are negative.  Dizziness Gait disorder  Blood pressure 146/70, pulse 82, weight 190 lb (86.183 kg).  Blood pressure standing, right arm, is 146/80. Blood pressure sitting, right arm, is 130/82.  Physical Exam  General: The patient is alert and cooperative at the time of the examination. The patient is moderately obese.  Skin: 1+ edema of ankles is seen bilaterally.   Neurologic Exam  Mental status: The patient is oriented x 3.  Cranial nerves: Facial symmetry is present. Speech is normal, no aphasia or dysarthria is noted. Extraocular movements are full. Visual fields are full. Mild masking of the face is seen.  Motor: The patient has good strength in all 4 extremities.  Sensory examination: Soft touch sensation is symmetric on the face, arms, and legs.  Coordination: The patient has good finger-nose-finger and heel-to-shin bilaterally. No significant tremors are noted.  Gait and station: The patient has a wide-based gait. The patient is able to rise with the arms crossed. With walking, the patient has arm swing on the right, slightly decreased on the left. The patient walks with a walker. Tandem gait is slightly unsteady. Romberg is negative. No drift is seen.  Reflexes: Deep tendon reflexes are symmetric.   Assessment/Plan:  1. History of Parkinson's disease  2. Gait disorder  3. Episodes of near-syncope, left hemiparesis  4. History of cerebrovascular disease, history of pontine stroke  This patient does have a history of cerebrovascular disease, and further workup is indicated. The patient is having unusual events of dizziness, and residual left hemiparesis lasting several days. The patient has had episodes over one year, which would be unusual for true TIA events. At any rate, further evaluation will be done. The patient is not orthostatic on this evaluation. The patient has minimal symptoms of parkinsonism, with a mild masked face, and  the patient actually has a wide-based gait, unusual for Parkinson's disease. The patient will be set up for MRI of the brain, MRA of the head, and a carotid Doppler study. An EEG study will be done. The patient will followup in 3-4 months. The patient apparently is already on aspirin and Plavix combination.  Marlan Palau MD 08/31/2013 6:58 PM  [facility] [address] [place], [place] [address]  Phone [phone] Fax [phone]

## 2013-09-04 ENCOUNTER — Telehealth: Payer: Self-pay | Admitting: Neurology

## 2013-09-04 NOTE — Telephone Encounter (Signed)
Called to schedule EEG appointment with patient, she stated that her husband was diagnose with cancer and, she will call back when, things get a little better.

## 2013-09-08 ENCOUNTER — Other Ambulatory Visit: Payer: Medicare Other | Admitting: Radiology

## 2013-09-14 ENCOUNTER — Ambulatory Visit
Admission: RE | Admit: 2013-09-14 | Discharge: 2013-09-14 | Disposition: A | Discharge status: Home / Self Care | Payer: Medicare Other | Source: Ambulatory Visit | Attending: Neurology | Admitting: Neurology

## 2013-09-14 DIAGNOSIS — G20 Parkinson's disease: Secondary | ICD-10-CM

## 2013-09-14 DIAGNOSIS — R42 Dizziness and giddiness: Secondary | ICD-10-CM

## 2013-09-14 DIAGNOSIS — G459 Transient cerebral ischemic attack, unspecified: Secondary | ICD-10-CM

## 2013-09-15 ENCOUNTER — Telehealth: Payer: Self-pay | Admitting: Neurology

## 2013-09-15 NOTE — Telephone Encounter (Signed)
I called the patient. The MRI the brain shows mild small vessel disease, MRA of the head was unremarkable. There are some herniated disc centrally at C3-4 and C4-5 levels. The patient is having significant neck discomfort or arm pain, we may pursue MRI evaluation of the cervical spine as well.

## 2013-09-26 ENCOUNTER — Ambulatory Visit (INDEPENDENT_AMBULATORY_CARE_PROVIDER_SITE_OTHER): Payer: Medicare Other

## 2013-09-26 DIAGNOSIS — R42 Dizziness and giddiness: Secondary | ICD-10-CM

## 2013-09-26 DIAGNOSIS — G20 Parkinson's disease: Secondary | ICD-10-CM

## 2013-10-02 ENCOUNTER — Telehealth: Payer: Self-pay | Admitting: Neurology

## 2013-10-02 NOTE — Telephone Encounter (Signed)
I called the patient to discuss the results of the carotid Doppler study, no answer noted. We'll call back later. The carotid Doppler study did not show any significant stenosis of the carotid vessels on either side.

## 2013-10-03 NOTE — Telephone Encounter (Signed)
Left message for patient with carotid doppler results, per Dr. Anne Hahn.  Patient cancelled her EEG appointment because her husband has cancer.  She stated she will call back to reschedule.

## 2013-10-09 ENCOUNTER — Telehealth: Payer: Self-pay | Admitting: Neurology

## 2013-10-09 NOTE — Telephone Encounter (Signed)
Patient left message that she wanted to schedule her EEG.  I left message that I would forward message for her to be scheduled.

## 2013-10-31 NOTE — Telephone Encounter (Signed)
Denese Killings can schedule this patient's EEG.  I thought it was already done.

## 2013-11-09 ENCOUNTER — Telehealth: Payer: Self-pay | Admitting: Neurology

## 2013-11-09 ENCOUNTER — Ambulatory Visit (INDEPENDENT_AMBULATORY_CARE_PROVIDER_SITE_OTHER): Payer: Medicare Other | Admitting: Radiology

## 2013-11-09 DIAGNOSIS — R42 Dizziness and giddiness: Secondary | ICD-10-CM

## 2013-11-09 DIAGNOSIS — G20 Parkinson's disease: Secondary | ICD-10-CM

## 2013-11-09 NOTE — Telephone Encounter (Signed)
Patient has been scheduled

## 2013-11-09 NOTE — Procedures (Signed)
    History:  [name] is an 80 year old patient with a history of Parkinson's disease with a one-year history of episodes of dizziness, lightheaded sensations and near-syncope while standing. No definite orthostatic hypotension has been documented. The patient may have occasional episodes of slurred speech. The patient is being evaluated for these episodes.  This is a routine EEG. No skull defects are noted. Medications include alprazolam, aspirin, Sinemet, Celexa, Plavix, Bentyl, fenofibrate, Flonase, Cozaar, Singulair, multivitamins, Protonix, and Symbicort.   EEG classification: Normal awake  Description of the recording: The background rhythms of this recording consists of a fairly well modulated medium amplitude alpha rhythm of 10 Hz that is reactive to eye opening and closure. As the record progresses, the patient appears to remain in the waking state throughout the recording. Photic stimulation was performed, resulting in a bilateral and symmetric photic driving response. Hyperventilation was not performed. At no time during the recording does there appear to be evidence of spike or spike wave discharges or evidence of focal slowing. EKG monitor shows no evidence of cardiac rhythm abnormalities with a heart rate of 78.  Impression: This is a normal EEG recording in the waking state. No evidence of ictal or interictal discharges are seen.

## 2013-11-09 NOTE — Telephone Encounter (Signed)
I called patient. The patient had an EEG study today that was normal. The patient still having some events of dizziness with standing, but this is less frequent. The patient indicates that she sits down quickly, she can get over the episode very quickly. I'll see the patient in the future for a revisit.

## 2014-03-09 ENCOUNTER — Ambulatory Visit: Payer: Medicare Other | Admitting: Neurology

## 2014-04-25 ENCOUNTER — Ambulatory Visit: Payer: Medicare Other | Admitting: Neurology

## 2014-05-14 ENCOUNTER — Ambulatory Visit (INDEPENDENT_AMBULATORY_CARE_PROVIDER_SITE_OTHER): Payer: Medicare Other | Admitting: Neurology

## 2014-05-14 ENCOUNTER — Encounter: Payer: Self-pay | Admitting: Neurology

## 2014-05-14 ENCOUNTER — Encounter (INDEPENDENT_AMBULATORY_CARE_PROVIDER_SITE_OTHER): Payer: Self-pay

## 2014-05-14 VITALS — BP 116/78 | HR 70 | Wt 201.0 lb

## 2014-05-14 DIAGNOSIS — R42 Dizziness and giddiness: Secondary | ICD-10-CM

## 2014-05-14 DIAGNOSIS — G20 Parkinson's disease: Secondary | ICD-10-CM

## 2014-05-14 DIAGNOSIS — R55 Syncope and collapse: Secondary | ICD-10-CM

## 2014-05-14 NOTE — Patient Instructions (Signed)

## 2014-05-14 NOTE — Progress Notes (Signed)
Reason for visit: Parkinson's disease  [name] is an 80 y.o. female  History of present illness:  [name] is an 80 year old right-handed white female with a history of Parkinson's disease. The patient has been on Sinemet 25/250 mg tablets 3 times daily for several years. The patient has had very minimal progression over time, but she does have some increased difficulty with arising from a seated position. She is having episodes of near-syncope, and these episodes are becoming more frequent over time. She has a history of cerebrovascular disease with left-sided weakness. Recent evaluation has included MRI of the brain that shows relatively extensive small vessel ischemic changes that are stable, and these changes also affect the brainstem. MRA of the head was unremarkable, and carotid Doppler study was unremarkable. An EEG study was normal. The patient describes the events as coming on relatively suddenly, she will have to sit down or else she feels as if she might faint or fall. The patient has a pressure sensation in the chest, but no palpitations of the heart. She then gets a discomfort that spreads down into the legs. The patient indicates that she may feel spacey or cloudy headed. The patient will feel weak for about 3 days following an event. The last such episode was one month ago. The patient has never sustained a true blackout episode. She returns to this office for an evaluation.  Past Medical History  Diagnosis Date  . Parkinson disease   . Cerebrovascular disease   . TIA (transient ischemic attack)   . Depression   . Dyslipidemia   . Glaucoma   . GERD (gastroesophageal reflux disease)   . Obesity   . Endometriosis   . Paralysis agitans 08/31/2013  . Macular degeneration     bilateral    Past Surgical History  Procedure Laterality Date  . Gallbladder surgery    . Abdominal hysterectomy    . Tonsillectomy    . Cataract extraction, bilateral    .  Pilonidal cyst resection      Family History  Problem Relation Age of Onset  . Arthritis Brother   . Skin cancer Brother     Social history:  reports that she has never smoked. She has never used smokeless tobacco. She reports that she does not drink alcohol or use illicit drugs.    Allergies  Allergen Reactions  . Comtan [Entacapone]   . Mirapex [Pramipexole Dihydrochloride]   . Sulfa Antibiotics     Medications:  Current Outpatient Prescriptions on File Prior to Visit  Medication Sig Dispense Refill  . ALPRAZolam (XANAX) 0.5 MG tablet Take 0.5 mg by mouth 3 (three) times daily as needed for sleep.      Marland Kitchen aspirin 81 MG tablet Take 81 mg by mouth daily.      . budesonide-formoterol (SYMBICORT) 80-4.5 MCG/ACT inhaler Inhale 2 puffs into the lungs 2 (two) times daily.      . carbidopa-levodopa (SINEMET IR) 25-250 MG per tablet Take 1 tablet by mouth 3 (three) times daily.       . citalopram (CELEXA) 20 MG tablet Take 20 mg by mouth daily.      . clopidogrel (PLAVIX) 75 MG tablet Take 75 mg by mouth daily.      Marland Kitchen dicyclomine (BENTYL) 20 MG tablet Take 20 mg by mouth every 6 (six) hours.      . enalapril (VASOTEC) 10 MG tablet Take 10 mg by mouth daily.      . fenofibrate  160 MG tablet Take 160 mg by mouth daily.      . fluticasone (FLONASE) 50 MCG/ACT nasal spray Place 2 sprays into the nose daily.      . folic acid-vitamin b complex-vitamin c-selenium-zinc (DIALYVITE) 3 MG TABS tablet Take 1 tablet by mouth daily.      Marland Kitchen losartan (COZAAR) 25 MG tablet Take 25 mg by mouth daily.      . montelukast (SINGULAIR) 10 MG tablet Take 10 mg by mouth daily.      . Multiple Vitamin (MULTIVITAMIN) tablet Take 1 tablet by mouth daily.      . pantoprazole (PROTONIX) 40 MG tablet Take 40 mg by mouth daily.      . travoprost, benzalkonium, (TRAVATAN) 0.004 % ophthalmic solution Place 1 drop into both eyes at bedtime.       No current facility-administered medications on file prior to visit.     ROS:  Out of a complete 14 system review of symptoms, the patient complains only of the following symptoms, and all other reviewed systems are negative.  Wheezing Environmental allergies Dizziness  Blood pressure 116/78, pulse 70, weight 201 lb (91.173 kg).  Blood pressure, right arm, standing is 122/74. Blood pressure, sitting, right arm is 140/78.  Physical Exam  General: The patient is alert and cooperative at the time of the examination. The patient is moderately obese.  Skin: No significant peripheral edema is noted.   Neurologic Exam  Mental status: The patient is oriented x 3.  Cranial nerves: Facial symmetry is present. Speech is normal, no aphasia or dysarthria is noted. Extraocular movements are full. Visual fields are full. Masking of the face is seen.  Motor: The patient has good strength in all 4 extremities.  Sensory examination: Soft touch sensation is symmetric of the face, arms, and legs.  Coordination: The patient has good finger-nose-finger and heel-to-shin bilaterally.  Gait and station: The patient takes short steps, walks with a walker. Tandem gait was not attempted. Romberg is negative. No drift is seen.  Reflexes: Deep tendon reflexes are symmetric.    MRI brain 09/15/13:  IMPRESSION: Abnormal MRI scan of the brain showing moderate degree of changes of chronic microvascular ischemia and generalized cerebral atrophy. Overall minimal progression compared with previous MRI scan dated 01/13/2010    Assessment/Plan:  1. Parkinson's disease  2. Cerebrovascular disease  3. Episodic near syncope, leg discomfort , etiology unclear  The patient does have a moderate level of small vessel disease that could offer some impact on the balance with walking. The patient is having episodes of sensations of near-syncope, however. She also describes a chest pressure, cognitive alteration, and leg discomfort with these events with sensations of weakness  lasting 3 days following the event. The etiology of these episodes is unclear. The patient will undergo a prolonged cardiac monitor for about 3 weeks. She will followup through this office in about 4 months. She will continue on Sinemet 25/250 tablets 3 times daily.  Jill Alexanders MD 05/14/2014 7:49 PM  [facility] [address] [place], [place] [address]  Phone [phone] Fax [phone]

## 2014-05-16 ENCOUNTER — Telehealth: Payer: Self-pay | Admitting: Neurology

## 2014-05-16 ENCOUNTER — Encounter: Payer: Self-pay | Admitting: *Deleted

## 2014-05-16 NOTE — Telephone Encounter (Signed)
done

## 2014-05-16 NOTE — Progress Notes (Addendum)
Patient ID: [name], female   DOB: 10-Jun-1930, 80 y.o.   MRN: [id number] Patient refused 30 day cardiac event monitor.  She feels this would be too much for her to handle at this time. She is currently  caring for her husband who has dementia.

## 2014-05-17 ENCOUNTER — Telehealth: Payer: Self-pay | Admitting: Neurology

## 2014-05-17 NOTE — Telephone Encounter (Signed)
Called [name] at [facility] stated that pt refuse to have the event monitor, because of pt having a lot on her plate. [name] just wanted to make Dr. Jannifer Franklin aware. FYI

## 2014-05-17 NOTE — Telephone Encounter (Signed)
Events noted

## 2014-05-17 NOTE — Telephone Encounter (Signed)
Recent orders were not visibly clear and need to be faxed back over to Fax # [phone]  Attention Erline Levine

## 2014-05-25 ENCOUNTER — Emergency Department (HOSPITAL_COMMUNITY): Payer: Medicare Other

## 2014-05-25 ENCOUNTER — Encounter (HOSPITAL_COMMUNITY): Payer: Self-pay | Admitting: Emergency Medicine

## 2014-05-25 ENCOUNTER — Observation Stay (HOSPITAL_COMMUNITY)
Admission: EM | Admit: 2014-05-25 | Discharge: 2014-05-28 | Disposition: A | Discharge status: Nursing Home (Includes ICF) | Payer: Medicare Other | Attending: Internal Medicine | Admitting: Internal Medicine

## 2014-05-25 ENCOUNTER — Observation Stay (HOSPITAL_COMMUNITY): Payer: Medicare Other

## 2014-05-25 DIAGNOSIS — G20 Parkinson's disease: Secondary | ICD-10-CM

## 2014-05-25 DIAGNOSIS — R55 Syncope and collapse: Secondary | ICD-10-CM | POA: Insufficient documentation

## 2014-05-25 DIAGNOSIS — I10 Essential (primary) hypertension: Secondary | ICD-10-CM | POA: Insufficient documentation

## 2014-05-25 DIAGNOSIS — F3289 Other specified depressive episodes: Secondary | ICD-10-CM | POA: Insufficient documentation

## 2014-05-25 DIAGNOSIS — K219 Gastro-esophageal reflux disease without esophagitis: Secondary | ICD-10-CM | POA: Diagnosis not present

## 2014-05-25 DIAGNOSIS — I679 Cerebrovascular disease, unspecified: Secondary | ICD-10-CM | POA: Diagnosis present

## 2014-05-25 DIAGNOSIS — G459 Transient cerebral ischemic attack, unspecified: Secondary | ICD-10-CM | POA: Diagnosis not present

## 2014-05-25 DIAGNOSIS — H3530 Unspecified macular degeneration: Secondary | ICD-10-CM | POA: Diagnosis not present

## 2014-05-25 DIAGNOSIS — J45909 Unspecified asthma, uncomplicated: Secondary | ICD-10-CM | POA: Diagnosis not present

## 2014-05-25 DIAGNOSIS — F329 Major depressive disorder, single episode, unspecified: Secondary | ICD-10-CM | POA: Diagnosis not present

## 2014-05-25 DIAGNOSIS — G20A1 Parkinson's disease without dyskinesia, without mention of fluctuations: Secondary | ICD-10-CM | POA: Diagnosis present

## 2014-05-25 DIAGNOSIS — Z7982 Long term (current) use of aspirin: Secondary | ICD-10-CM | POA: Insufficient documentation

## 2014-05-25 DIAGNOSIS — R002 Palpitations: Secondary | ICD-10-CM

## 2014-05-25 DIAGNOSIS — F411 Generalized anxiety disorder: Secondary | ICD-10-CM | POA: Insufficient documentation

## 2014-05-25 DIAGNOSIS — E669 Obesity, unspecified: Secondary | ICD-10-CM | POA: Insufficient documentation

## 2014-05-25 DIAGNOSIS — R5381 Other malaise: Secondary | ICD-10-CM | POA: Diagnosis present

## 2014-05-25 DIAGNOSIS — E1169 Type 2 diabetes mellitus with other specified complication: Secondary | ICD-10-CM | POA: Diagnosis present

## 2014-05-25 DIAGNOSIS — IMO0002 Reserved for concepts with insufficient information to code with codable children: Secondary | ICD-10-CM | POA: Diagnosis not present

## 2014-05-25 DIAGNOSIS — Z7902 Long term (current) use of antithrombotics/antiplatelets: Secondary | ICD-10-CM | POA: Diagnosis not present

## 2014-05-25 DIAGNOSIS — H409 Unspecified glaucoma: Secondary | ICD-10-CM | POA: Insufficient documentation

## 2014-05-25 DIAGNOSIS — E785 Hyperlipidemia, unspecified: Secondary | ICD-10-CM

## 2014-05-25 DIAGNOSIS — Z8673 Personal history of transient ischemic attack (TIA), and cerebral infarction without residual deficits: Secondary | ICD-10-CM

## 2014-05-25 HISTORY — DX: Unspecified osteoarthritis, unspecified site: M19.90

## 2014-05-25 HISTORY — DX: Headache: R51

## 2014-05-25 HISTORY — DX: Unspecified asthma, uncomplicated: J45.909

## 2014-05-25 HISTORY — DX: Essential (primary) hypertension: I10

## 2014-05-25 HISTORY — DX: Anxiety disorder, unspecified: F41.9

## 2014-05-25 LAB — DIFFERENTIAL
Basophils Absolute: 0.1 10*3/uL (ref 0.0–0.1)
Basophils Relative: 1 % (ref 0–1)
Eosinophils Absolute: 0.2 10*3/uL (ref 0.0–0.7)
Eosinophils Relative: 2 % (ref 0–5)
LYMPHS ABS: 3.2 10*3/uL (ref 0.7–4.0)
Lymphocytes Relative: 34 % (ref 12–46)
MONOS PCT: 7 % (ref 3–12)
Monocytes Absolute: 0.7 10*3/uL (ref 0.1–1.0)
NEUTROS ABS: 5.3 10*3/uL (ref 1.7–7.7)
NEUTROS PCT: 56 % (ref 43–77)

## 2014-05-25 LAB — CBC
HCT: 39.5 % (ref 36.0–46.0)
HEMOGLOBIN: 12.7 g/dL (ref 12.0–15.0)
MCH: 30 pg (ref 26.0–34.0)
MCHC: 32.2 g/dL (ref 30.0–36.0)
MCV: 93.2 fL (ref 78.0–100.0)
PLATELETS: 354 10*3/uL (ref 150–400)
RBC: 4.24 MIL/uL (ref 3.87–5.11)
RDW: 13.5 % (ref 11.5–15.5)
WBC: 9.5 10*3/uL (ref 4.0–10.5)

## 2014-05-25 LAB — COMPREHENSIVE METABOLIC PANEL
ALT: 9 U/L (ref 0–35)
ANION GAP: 13 (ref 5–15)
AST: 26 U/L (ref 0–37)
Albumin: 4 g/dL (ref 3.5–5.2)
Alkaline Phosphatase: 40 U/L (ref 39–117)
BILIRUBIN TOTAL: 0.3 mg/dL (ref 0.3–1.2)
BUN: 25 mg/dL — AB (ref 6–23)
CHLORIDE: 102 meq/L (ref 96–112)
CO2: 26 mEq/L (ref 19–32)
Calcium: 9.9 mg/dL (ref 8.4–10.5)
Creatinine, Ser: 0.89 mg/dL (ref 0.50–1.10)
GFR calc Af Amer: 68 mL/min — ABNORMAL LOW (ref >90)
GFR calc non Af Amer: 59 mL/min — ABNORMAL LOW (ref >90)
GLUCOSE: 89 mg/dL (ref 70–99)
Potassium: 4.1 mEq/L (ref 3.7–5.3)
SODIUM: 141 meq/L (ref 137–147)
Total Protein: 7.3 g/dL (ref 6.0–8.3)

## 2014-05-25 LAB — URINALYSIS, ROUTINE W REFLEX MICROSCOPIC
Bilirubin Urine: NEGATIVE
GLUCOSE, UA: NEGATIVE mg/dL
KETONES UR: NEGATIVE mg/dL
NITRITE: NEGATIVE
Protein, ur: NEGATIVE mg/dL
Specific Gravity, Urine: 1.02 (ref 1.005–1.030)
UROBILINOGEN UA: 0.2 mg/dL (ref 0.0–1.0)
pH: 6 (ref 5.0–8.0)

## 2014-05-25 LAB — RAPID URINE DRUG SCREEN, HOSP PERFORMED
Amphetamines: NOT DETECTED
Barbiturates: NOT DETECTED
Benzodiazepines: POSITIVE — AB
Cocaine: NOT DETECTED
Opiates: NOT DETECTED
Tetrahydrocannabinol: NOT DETECTED

## 2014-05-25 LAB — URINE MICROSCOPIC-ADD ON

## 2014-05-25 LAB — I-STAT CHEM 8, ED
BUN: 26 mg/dL — AB (ref 6–23)
CALCIUM ION: 1.25 mmol/L (ref 1.13–1.30)
CHLORIDE: 103 meq/L (ref 96–112)
CREATININE: 1.1 mg/dL (ref 0.50–1.10)
GLUCOSE: 91 mg/dL (ref 70–99)
HCT: 40 % (ref 36.0–46.0)
Hemoglobin: 13.6 g/dL (ref 12.0–15.0)
Potassium: 3.9 mEq/L (ref 3.7–5.3)
Sodium: 140 mEq/L (ref 137–147)
TCO2: 25 mmol/L (ref 0–100)

## 2014-05-25 LAB — ETHANOL: Alcohol, Ethyl (B): <11 mg/dL (ref 0–11)

## 2014-05-25 LAB — PROTIME-INR
INR: 1.02 (ref 0.00–1.49)
PROTHROMBIN TIME: 13.4 s (ref 11.6–15.2)

## 2014-05-25 LAB — APTT: APTT: 22 s — AB (ref 24–37)

## 2014-05-25 LAB — I-STAT TROPONIN, ED: Troponin i, poc: 0 ng/mL (ref 0.00–0.08)

## 2014-05-25 LAB — TROPONIN I: Troponin I: <0.3 ng/mL (ref <0.30)

## 2014-05-25 MED ORDER — ASPIRIN EC 325 MG PO TBEC
325.0000 mg | DELAYED_RELEASE_TABLET | Freq: Every day | ORAL | Status: DC
  Administered 2014-05-25 – 2014-05-28 (×4): 325 mg via ORAL
  Filled 2014-05-25 (×4): qty 1

## 2014-05-25 MED ORDER — CLOPIDOGREL BISULFATE 75 MG PO TABS
75.0000 mg | ORAL_TABLET | Freq: Every day | ORAL | Status: DC
  Administered 2014-05-25 – 2014-05-27 (×3): 75 mg via ORAL
  Filled 2014-05-25 (×3): qty 1

## 2014-05-25 MED ORDER — ATORVASTATIN CALCIUM 10 MG PO TABS
20.0000 mg | ORAL_TABLET | Freq: Every day | ORAL | Status: DC
  Administered 2014-05-25: 20 mg via ORAL
  Filled 2014-05-25: qty 2

## 2014-05-25 MED ORDER — SENNOSIDES-DOCUSATE SODIUM 8.6-50 MG PO TABS
1.0000 | ORAL_TABLET | Freq: Every evening | ORAL | Status: DC | PRN
  Administered 2014-05-26: 1 via ORAL
  Filled 2014-05-25: qty 1

## 2014-05-25 MED ORDER — ALPRAZOLAM 0.5 MG PO TABS
0.5000 mg | ORAL_TABLET | Freq: Three times a day (TID) | ORAL | Status: DC
  Administered 2014-05-25 – 2014-05-28 (×9): 0.5 mg via ORAL
  Filled 2014-05-25 (×9): qty 1

## 2014-05-25 MED ORDER — PANTOPRAZOLE SODIUM 40 MG PO TBEC
40.0000 mg | DELAYED_RELEASE_TABLET | Freq: Every day | ORAL | Status: DC
  Administered 2014-05-25 – 2014-05-28 (×4): 40 mg via ORAL
  Filled 2014-05-25 (×4): qty 1

## 2014-05-25 MED ORDER — STROKE: EARLY STAGES OF RECOVERY BOOK
Freq: Once | Status: AC
  Administered 2014-05-26: 07:00:00
  Filled 2014-05-25 (×2): qty 1

## 2014-05-25 MED ORDER — CITALOPRAM HYDROBROMIDE 10 MG PO TABS
30.0000 mg | ORAL_TABLET | Freq: Every day | ORAL | Status: DC
  Administered 2014-05-25 – 2014-05-27 (×3): 30 mg via ORAL
  Filled 2014-05-25 (×3): qty 3

## 2014-05-25 MED ORDER — HEPARIN SODIUM (PORCINE) 5000 UNIT/ML IJ SOLN
5000.0000 [IU] | Freq: Three times a day (TID) | INTRAMUSCULAR | Status: DC
  Administered 2014-05-25 – 2014-05-28 (×9): 5000 [IU] via SUBCUTANEOUS
  Filled 2014-05-25 (×7): qty 1

## 2014-05-25 MED ORDER — CARBIDOPA-LEVODOPA 25-250 MG PO TABS
1.0000 | ORAL_TABLET | Freq: Three times a day (TID) | ORAL | Status: DC
  Administered 2014-05-25 – 2014-05-28 (×9): 1 via ORAL
  Filled 2014-05-25 (×9): qty 1

## 2014-05-25 NOTE — ED Notes (Signed)
Md at bedside , report called to floor

## 2014-05-25 NOTE — H&P (Signed)
Date: 05/25/2014               Patient Name:  [name] MRN: [id number]  DOB: [birth year] Age / Sex: 80 y.o., female   PCP: No primary provider on file.         Medical Service: Internal Medicine Teaching Service         Attending Physician: Dr. Karren Cobble, MD    First Contact: Dr. Lottie Mussel Pager: [phone]  Second Contact: Dr. 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 She was last seen normal around 11:30am. The patient says she remembers being on the phone with her daughter and became confused, thinking it was her sister-in-law.  She began to slur her words. EMS was called and on arrival to the scene they noted left sided hemiplegia. A code stroke was called and on arrival to the ED, she was unable to lift her left arm; however she was noted to raise her left arm and leg while undressing in the ED, and her strength seemed dependent on effort. He speech at that time was noted to be muffled but the patient was not opening her mouth fully to speak. A CT head was performed and was negative. Per report from the ED physician and from Neurology, the patient's symptoms were improving after arrival to the ED. She denies any loss of consciousness, fecal or urinary incontinence, changes in vision or hearing, chest pain, SOB, abdominal or flank pain, nausea, emesis, diarrhea, constipation, fevers, chills, or changes in weight.  Meds: Current Facility-Administered Medications  Medication Dose Route Frequency Provider Last Rate Last Dose  .   stroke: mapping our early stages of recovery book   Does not apply Once Otho Bellows, MD      . ALPRAZolam Duanne Moron) tablet 0.5 mg  0.5 mg Oral TID Otho Bellows, MD      . aspirin EC tablet 325 mg  325 mg Oral Daily Otho Bellows, MD      . atorvastatin (LIPITOR) tablet 20 mg  20 mg Oral q1800 Otho Bellows, MD      . carbidopa-levodopa (SINEMET IR) 25-250 MG per tablet immediate release 1 tablet  1 tablet Oral TID AC Otho Bellows, MD      . citalopram (CELEXA) tablet 30 mg  30 mg Oral QHS Otho Bellows, MD      . clopidogrel (PLAVIX) tablet 75 mg  75 mg Oral QHS Otho Bellows, MD      . heparin injection 5,000 Units  5,000 Units Subcutaneous 3 times per day Otho Bellows, MD      . pantoprazole (PROTONIX) EC tablet 40 mg  40 mg Oral Daily Otho Bellows, MD      . senna-docusate (Senokot-S) tablet 1 tablet  1 tablet Oral QHS PRN Otho Bellows, MD        Allergies: Allergies as of 05/25/2014 - Review Complete 05/25/2014  Allergen Reaction Noted  . Comtan [entacapone]  08/25/2013  . Mirapex [pramipexole dihydrochloride]  08/25/2013  .  Sulfa antibiotics  08/25/2013   Past Medical History  Diagnosis Date  . Parkinson disease   . Cerebrovascular disease     CVA '03  . TIA (transient ischemic attack)     Multiple  . Depression   . Dyslipidemia   . Glaucoma   . GERD (gastroesophageal reflux disease)   . Obesity   . Endometriosis   . Paralysis agitans 08/31/2013  . Macular degeneration     bilateral  . Hypertension   . Asthma   . Anxiety   . Headache(784.0)   . Arthritis    Past Surgical History  Procedure Laterality Date  . Gallbladder surgery    . Abdominal hysterectomy    . Tonsillectomy    . Cataract extraction, bilateral    . Pilonidal cyst resection     Family History  Problem Relation Age of Onset  . Arthritis Brother   . Skin cancer Brother    History   Social History  . Marital Status: Married    Spouse Name: N/A    Number of Children:  4  . Years of Education: HS   Occupational History  . Retired    Social History Main Topics  . Smoking status: Never Smoker   . Smokeless tobacco: Never Used  . Alcohol Use: No  . Drug Use: No  . Sexual Activity: No   Other Topics Concern  . Not on file   Social History Narrative  . No narrative on file    Review of Systems: A comprehensive review of systems was negative except for: as noted above in HPI  Physical Exam: Blood pressure 143/62, pulse 95, temperature 97.3 F (36.3 C), temperature source Oral, resp. rate 16, height 5' (1.524 m), weight 201 lb (91.173 kg), SpO2 95.00%. BP 143/62  Pulse 95  Temp(Src) 97.3 F (36.3 C) (Oral)  Resp 16  Ht 5' (1.524 m)  Wt 201 lb (91.173 kg)  BMI 39.26 kg/m2  SpO2 95%  General Appearance:    Alert, cooperative, no distress, appears stated age  7:    Normocephalic, without obvious abnormality, atraumatic, no droop but did not open her mouth wide when speaking, PERRL, EOMI, MMM, no sublingual icterus  Neck:   No carotit bruit or JVD  Back:     Symmetric, no curvature  Lungs:     Clear to auscultation bilaterally, respirations unlabored  Chest Wall:    No tenderness or deformity   Heart:    Regular rate and rhythm, S1 and S2 normal, no murmur, rub   or gallop  Abdomen:     Soft, non-tender, bowel sounds active all four quadrants,    no masses, no organomegaly  Extremities:   Extremities normal, atraumatic, no cyanosis or edema, mild cogwheel rigidity in upper extremities  Pulses:   2+ and symmetric radial and DP pulses  Neurologic:   CNII-XII intact, 5/5 strength on the R and L upper and lower extremities, sensation intact and symmetric, no Babinskis, intention tremor bilaterally    Lab results: Basic Metabolic Panel:  Recent Labs  05/25/14 1204 05/25/14 1216  NA 141 140  K 4.1 3.9  CL 102 103  CO2 26  --   GLUCOSE 89 91  BUN 25* 26*  CREATININE 0.89 1.10  CALCIUM 9.9  --    Liver Function Tests:  Recent  Labs  05/25/14 1204  AST 26  ALT 9  ALKPHOS 40  BILITOT 0.3  PROT 7.3  ALBUMIN 4.0   CBC:  Recent Labs  05/25/14 1204 05/25/14 1216  WBC 9.5  --   NEUTROABS 5.3  --   HGB 12.7 13.6  HCT 39.5 40.0  MCV 93.2  --   PLT 354  --    Coagulation:  Recent Labs  05/25/14 1204  LABPROT 13.4  INR 1.02   Alcohol Level:  Recent Labs  05/25/14 1204  ETH <11    Imaging results:  Ct Head Wo Contrast  05/25/2014   CLINICAL DATA:  Code stroke, left-sided paralysis  EXAM: CT HEAD WITHOUT CONTRAST  TECHNIQUE: Contiguous axial images were obtained from the base of the skull through the vertex without intravenous contrast.  COMPARISON:  MRI brain 09/14/2013  FINDINGS: There is no evidence of mass effect, midline shift, or extra-axial fluid collections. There is no evidence of a space-occupying lesion or intracranial hemorrhage. There is no evidence of a cortical-based area of acute infarction. There is generalized cerebral atrophy. There is periventricular white matter low attenuation likely secondary to microangiopathy.  The ventricles and sulci are appropriate for the patient's age. The basal cisterns are patent.  Visualized portions of the orbits are unremarkable. The visualized portions of the paranasal sinuses and mastoid air cells are unremarkable. Cerebrovascular atherosclerotic calcifications are noted.  The osseous structures are unremarkable.  IMPRESSION: No acute intracranial pathology.   Electronically Signed   By: Kathreen Devoid   On: 05/25/2014 12:20    Other results: EKG: poor quality, PR interval 224, left anterior fascicular block  Assessment & Plan by Problem: Pt is an 80yo F with PMH Parkinson's disease, Depression, grade 1 diastolic dysfunction, dyslipidemia, and previous TIA and CVA who presents from her assisted living facility with acute onset weakness and dysarthria.   #TIA (transient ischemic attack): Pt with h/o TIAs and CVA '03 with residual left sided weakness  and decreased sensation. She has had several TIA's in the past and notes the last was a "couple months ago."  She presents with acute onset weakness with dysarthria that was improving once in the ED. ED staff noted to have continued L pronator drift. Per the pt, she has had multiple TIAs and when they occur, the affect her left side causing increased weakness. CT head negative for acute intracranial process, but pt with risk factors for CVA, including previous CVA and dyslipidemia. She is currently taking ASA and Plavix due to her previous CVA history. With her improving speech and weakness, her symptoms seem to be related to a TIA vs full CVA; however we have to r/o CVA. Bedside swallow ok - Admit to IMTS to tele - MRI/MRA - 2D ECHO - Carotid dopplers - Checking A1c and lipid panel - Continue ASA and Plavix - Starting low dose statin - PT/OT/SLP - Neuro checks q2h x12h, then q4h -heart healthy diet  #Dyslipidemia: last lipid panel in the system is from 2011 with normal values. She is not on a statin at home. Repeat lipid panel on admission.  -atorvastatin 20 mg po daily  #Parkinson disease: Stable. She is followed by Dr Jannifer Franklin from neurology. Last seen on 05/14/14 with normal neuro exam but notes difficulty rising from seated position. She also reported episodes of presyncope that have gotten more frequent over time. I appreciated some mild cogwheel rigidity on exam. Pt on carbidopa-levodopa at home, which was continued on admission. -sinemet 25-250 po TIDAC  #Depression/Anxiety: Stable. Pt on Celexa and Xanax at home, which were held on admission while NPO waiting for swallow. Passed swallow test -restarted celexa  and xanax  #GERD: Stable. Pt on Protonix at home which was held on admission while NPO. Restarted once cleared after swallow. -protonix 40 mg po daily  #DVT PPx: Talent Heaprin  Dispo: Disposition is deferred at this time, awaiting improvement of current medical problems. Anticipated  discharge in approximately 1-2 day(s).   The patient does not have a current PCP (No primary provider on file.) and does not need an [facility] hospital follow-up appointment after discharge.  The patient does not have transportation limitations that hinder transportation to clinic appointments.  Signed: Kelby Aline, MD 05/25/2014, 4:44 PM

## 2014-05-25 NOTE — Code Documentation (Signed)
80yo female arriving to [facility] via [place] at 1203.  Patient from assisted living, was on the phone with her son when witnessed by nurse to have slurred speech.  LKW 1130.  EMS reports sudden onset slurred speech and left sided hemiplegia.  Code stroke activated.  Patient cleared at the bridge on arrival, labs drawn, CT completed.  NIHSS 3 on arrival, see documentation for details and times.  Patient with questionable effort on exam.  Patient will not lift arm off bed, but lifted arm when staff put on her gown.  When arm lifted for the patient, drift noted.  Patient with slow speech, but able to answer questions appropriately, name objects, read words/phrases.  Patient reports decreased sensation in left arm and leg are from previous stroke.  Dr. Nicole Kindred at bedside for assessment.  Patient is too mild to treat at this time per MD, but remains in the window to treat with tPA until 1430 should symptoms worsen.  Patient to be admitted for workup per MD.  Bedside handoff with ED RN [place].

## 2014-05-25 NOTE — Progress Notes (Signed)
MD paged and notified that patient has arrived on the unit.  Also, notified that patient has DNR in the chart. Will continue to monitor patient.

## 2014-05-25 NOTE — ED Notes (Signed)
Cbg from EMS was 109

## 2014-05-25 NOTE — Consult Note (Signed)
Referring Physician: Christy Gentles    Chief Complaint: Code stroke  HPI:                                                                                                                                         [name] is an 80 y.o. female who lives in a assisted living. Patient was on the phone with her son when staff noted she was slurring her words. EMS was called.  On scene they noted left sided hemiplegia. Code stroke was activated. On arrival to ED patient initially would not lift arm off the bed but when staff was placing her gown on, she was able to lift the left arm and leg. Speech was muffled but patient would not fully open her mouth to articulate. Initial CT head was negative for acute stroke or bleed. As patient was further examined patient showed effort dependent strength. Patient states her left arm and leg decreased sensation is not new.   Date last known well: Date: 05/25/2014 Time last known well: Time: 11:30 tPA Given: No: resolving and minimal symptoms  Past Medical History  Diagnosis Date  . Parkinson disease   . Cerebrovascular disease   . TIA (transient ischemic attack)   . Depression   . Dyslipidemia   . Glaucoma   . GERD (gastroesophageal reflux disease)   . Obesity   . Endometriosis   . Paralysis agitans 08/31/2013  . Macular degeneration     bilateral    Past Surgical History  Procedure Laterality Date  . Gallbladder surgery    . Abdominal hysterectomy    . Tonsillectomy    . Cataract extraction, bilateral    . Pilonidal cyst resection      Family History  Problem Relation Age of Onset  . Arthritis Brother   . Skin cancer Brother    Social History:  reports that she has never smoked. She has never used smokeless tobacco. She reports that she does not drink alcohol or use illicit drugs.  Allergies:  Allergies  Allergen Reactions  . Comtan [Entacapone]   . Mirapex [Pramipexole Dihydrochloride]   . Sulfa Antibiotics     Medications:                                                                                                                            No current facility-administered  medications for this encounter.   Current Outpatient Prescriptions  Medication Sig Dispense Refill  . ALPRAZolam (XANAX) 0.5 MG tablet Take 0.5 mg by mouth 3 (three) times daily.       Marland Kitchen aspirin EC 325 MG tablet Take 325 mg by mouth daily.      . carbidopa-levodopa (SINEMET IR) 25-250 MG per tablet Take 1 tablet by mouth 3 (three) times daily before meals. Patient takes at 7am, 12noon, and 5pm      . Cholecalciferol (VITAMIN D-3) 5000 UNITS TABS Take 5,000 Units by mouth daily at 12 noon.       . citalopram (CELEXA) 20 MG tablet Take 30 mg by mouth at bedtime.       . clopidogrel (PLAVIX) 75 MG tablet Take 75 mg by mouth at bedtime.       . dicyclomine (BENTYL) 20 MG tablet Take 20 mg by mouth 4 (four) times daily as needed (for IBS symptoms).       . fenofibrate 160 MG tablet Take 160 mg by mouth daily.      . fluticasone (FLONASE) 50 MCG/ACT nasal spray Place 2 sprays into the nose daily.      . folic acid-vitamin b complex-vitamin c-selenium-zinc (DIALYVITE) 3 MG TABS tablet Take 1 tablet by mouth daily.      Marland Kitchen losartan (COZAAR) 25 MG tablet Take 25 mg by mouth daily.      . montelukast (SINGULAIR) 10 MG tablet Take 10 mg by mouth daily.      . pantoprazole (PROTONIX) 40 MG tablet Take 40 mg by mouth daily.      Marland Kitchen talc (ZEASORB) powder Apply 1 application topically 2 (two) times daily as needed (for irritation).      . travoprost, benzalkonium, (TRAVATAN) 0.004 % ophthalmic solution Place 1 drop into both eyes at bedtime.         ROS:                                                                                                                                       History obtained from the patient  General ROS: negative for - chills, fatigue, fever, night sweats, weight gain or weight loss Psychological ROS: negative for - behavioral  disorder, hallucinations, memory difficulties, mood swings or suicidal ideation Ophthalmic ROS: negative for - blurry vision, double vision, eye pain or loss of vision ENT ROS: negative for - epistaxis, nasal discharge, oral lesions, sore throat, tinnitus or vertigo Allergy and Immunology ROS: negative for - hives or itchy/watery eyes Hematological and Lymphatic ROS: negative for - bleeding problems, bruising or swollen lymph nodes Endocrine ROS: negative for - galactorrhea, hair pattern changes, polydipsia/polyuria or temperature intolerance Respiratory ROS: negative for - cough, hemoptysis, shortness of breath or wheezing Cardiovascular ROS: negative for - chest pain, dyspnea on exertion, edema or irregular heartbeat Gastrointestinal ROS: negative for - abdominal pain, diarrhea, hematemesis, nausea/vomiting or  stool incontinence Genito-Urinary ROS: negative for - dysuria, hematuria, incontinence or urinary frequency/urgency Musculoskeletal ROS: negative for - joint swelling or muscular weakness Neurological ROS: as noted in HPI Dermatological ROS: negative for rash and skin lesion changes  Neurologic Examination:                                                                                                      Blood pressure 145/64, pulse 73, temperature 98.6 F (37 C), temperature source Oral, resp. rate 16, SpO2 96.00%.   General: NAD Mental Status: Alert, oriented, thought content appropriate.  Speech muffled (pateint will not open mouth fully when talking) without evidence of aphasia.  Able to follow 3 step commands without difficulty. Cranial Nerves: II: Discs flat bilaterally; Visual fields grossly normal, pupils equal, round, reactive to light and accommodation III,IV, VI: ptosis not present, extra-ocular motions intact bilaterally V,VII: smile symmetric, facial light touch sensation normal bilaterally VIII: hearing normal bilaterally IX,X: gag reflex present XI: bilateral  shoulder shrug XII: midline tongue extension without atrophy or fasciculations  Motor: Right : Upper extremity   5/5    Left:     Upper extremity   4-/5  Lower extremity   5/5     Lower extremity   4-/5 --left arm and leg was effort dependent Tone and bulk:normal tone throughout; no atrophy noted Sensory: Pinprick and light touch decreased on the left arm and leg--patient states this is old.  Deep Tendon Reflexes:  Right: Upper Extremity   Left: Upper extremity   biceps (C-5 to C-6) 2/4   biceps (C-5 to C-6) 2/4 tricep (C7) 2/4    triceps (C7) 2/4 Brachioradialis (C6) 2/4  Brachioradialis (C6) 2/4  Lower Extremity Lower Extremity  quadriceps (L-2 to L-4) 1/4   quadriceps (L-2 to L-4) 1/4 Achilles (S1) 1/4   Achilles (S1) 1/4  Plantars: Right: downgoing   Left: downgoing Cerebellar: normal finger-to-nose,  normal heel-to-shin test Gait: not tested CV: pulses palpable throughout    Lab Results: Basic Metabolic Panel:  Recent Labs Lab 05/25/14 1204 05/25/14 1216  NA 141 140  K 4.1 3.9  CL 102 103  CO2 26  --   GLUCOSE 89 91  BUN 25* 26*  CREATININE 0.89 1.10  CALCIUM 9.9  --     Liver Function Tests:  Recent Labs Lab 05/25/14 1204  AST 26  ALT 9  ALKPHOS 40  BILITOT 0.3  PROT 7.3  ALBUMIN 4.0   No results found for this basename: LIPASE, AMYLASE,  in the last 168 hours No results found for this basename: AMMONIA,  in the last 168 hours  CBC:  Recent Labs Lab 05/25/14 1204 05/25/14 1216  WBC 9.5  --   NEUTROABS 5.3  --   HGB 12.7 13.6  HCT 39.5 40.0  MCV 93.2  --   PLT 354  --     Cardiac Enzymes: No results found for this basename: CKTOTAL, CKMB, CKMBINDEX, TROPONINI,  in the last 168 hours  Lipid Panel: No results found for this basename: CHOL, TRIG, HDL, CHOLHDL, VLDL, LDLCALC,  in the last 168 hours  CBG: No results found for this basename: GLUCAP,  in the last 168 hours  Microbiology: Results for orders placed during the hospital  encounter of 01/14/10  URINE CULTURE     Status: None   Collection Time    01/14/10  3:21 PM      Result Value Ref Range Status   Specimen Description URINE, CATHETERIZED   Final   Special Requests NONE   Final   Colony Count NO GROWTH   Final   Culture NO GROWTH   Final   Report Status 01/16/2010 FINAL   Final    Coagulation Studies:  Recent Labs  05/25/14 1204  LABPROT 13.4  INR 1.02    Imaging: Ct Head Wo Contrast  05/25/2014   CLINICAL DATA:  Code stroke, left-sided paralysis  EXAM: CT HEAD WITHOUT CONTRAST  TECHNIQUE: Contiguous axial images were obtained from the base of the skull through the vertex without intravenous contrast.  COMPARISON:  MRI brain 09/14/2013  FINDINGS: There is no evidence of mass effect, midline shift, or extra-axial fluid collections. There is no evidence of a space-occupying lesion or intracranial hemorrhage. There is no evidence of a cortical-based area of acute infarction. There is generalized cerebral atrophy. There is periventricular white matter low attenuation likely secondary to microangiopathy.  The ventricles and sulci are appropriate for the patient's age. The basal cisterns are patent.  Visualized portions of the orbits are unremarkable. The visualized portions of the paranasal sinuses and mastoid air cells are unremarkable. Cerebrovascular atherosclerotic calcifications are noted.  The osseous structures are unremarkable.  IMPRESSION: No acute intracranial pathology.   Electronically Signed   By: Kathreen Devoid   On: 05/25/2014 12:20       Assessment and plan discussed with with attending physician and they are in agreement.    Etta Quill PA-C [facility] [phone]  05/25/2014, 12:55 PM   Assessment: 80 y.o. female with new onset of slurred speech and increased left sided weakness. On arrival patients symptoms were improving and NIHSS was 3.  tPA was not given due to low NIHSS and improving symptoms.  With risk factors for  stroke as well as history of CVA, recurrent stroke will need to be ruled out, as well as assessment of overall stroke risk.  Stroke Risk Factors - hyperlipidemia and CVA  1. HgbA1c, fasting lipid panel 2. MRI, MRA  of the brain without contrast 3. PT consult, OT consult, Speech consult 4. Echocardiogram 5. Carotid dopplers 6. Prophylactic therapy-current dose of ASA and Plavix 7. Risk factor modification 8. Telemetry monitoring 9. Frequent neuro checks  I personally participated in this patient's evaluation and management, including avoiding above clinical impression and management recommendations.  Rush Farmer M.D. [facility] [phone]

## 2014-05-25 NOTE — ED Provider Notes (Signed)
CSN: [id number]     Arrival date & time 05/25/14  1203 History   First MD Initiated Contact with Patient 05/25/14 1211     Chief Complaint  Patient presents with  . Code Stroke    An emergency department physician performed an initial assessment on this suspected stroke patient at 1203.  Patient is a 80 y.o. female presenting with weakness. The history is provided by the patient and the EMS personnel. The history is limited by the condition of the patient.  Weakness This is a new problem. The current episode started less than 1 hour ago. The problem occurs constantly. The problem has not changed since onset.Pertinent negatives include no chest pain, no abdominal pain and no headaches. Nothing aggravates the symptoms. Nothing relieves the symptoms. She has tried rest for the symptoms. The treatment provided no relief.  Pt presents for onset of dysarthria and left sided weakness, last known well approximately 1130am.  This was noticed by nursing home staff.  She had otherwise been at her baseline prior to this episode  Initial history limited due to acuity of condition   Past Medical History  Diagnosis Date  . Parkinson disease   . Cerebrovascular disease   . TIA (transient ischemic attack)   . Depression   . Dyslipidemia   . Glaucoma   . GERD (gastroesophageal reflux disease)   . Obesity   . Endometriosis   . Paralysis agitans 08/31/2013  . Macular degeneration     bilateral   Past Surgical History  Procedure Laterality Date  . Gallbladder surgery    . Abdominal hysterectomy    . Tonsillectomy    . Cataract extraction, bilateral    . Pilonidal cyst resection     Family History  Problem Relation Age of Onset  . Arthritis Brother   . Skin cancer Brother    History  Substance Use Topics  . Smoking status: Never Smoker   . Smokeless tobacco: Never Used  . Alcohol Use: No   OB History   Grav Para Term Preterm Abortions TAB SAB Ect Mult Living                 Review  of Systems  Unable to perform ROS: Acuity of condition  Cardiovascular: Negative for chest pain.  Gastrointestinal: Negative for abdominal pain.  Neurological: Positive for weakness. Negative for headaches.      Allergies  Comtan; Mirapex; and Sulfa antibiotics  Home Medications   Prior to Admission medications   Medication Sig Start Date End Date Taking? Authorizing Provider  ALPRAZolam Duanne Moron) 0.5 MG tablet Take 0.5 mg by mouth 3 (three) times daily as needed for sleep.    Historical Provider, MD  aspirin 81 MG tablet Take 81 mg by mouth daily.    Historical Provider, MD  budesonide-formoterol (SYMBICORT) 80-4.5 MCG/ACT inhaler Inhale 2 puffs into the lungs 2 (two) times daily.    Historical Provider, MD  carbidopa-levodopa (SINEMET IR) 25-250 MG per tablet Take 1 tablet by mouth 3 (three) times daily.  08/16/13   Historical Provider, MD  Cholecalciferol (VITAMIN D-3) 5000 UNITS TABS Take 1 tablet by mouth daily.    Historical Provider, MD  citalopram (CELEXA) 20 MG tablet Take 20 mg by mouth daily. 08/16/13   Historical Provider, MD  clopidogrel (PLAVIX) 75 MG tablet Take 75 mg by mouth daily.    Historical Provider, MD  dicyclomine (BENTYL) 20 MG tablet Take 20 mg by mouth every 6 (six) hours.    Historical  Provider, MD  enalapril (VASOTEC) 10 MG tablet Take 10 mg by mouth daily.    Historical Provider, MD  fenofibrate 160 MG tablet Take 160 mg by mouth daily.    Historical Provider, MD  fluticasone (FLONASE) 50 MCG/ACT nasal spray Place 2 sprays into the nose daily.    Historical Provider, MD  folic acid-vitamin b complex-vitamin c-selenium-zinc (DIALYVITE) 3 MG TABS tablet Take 1 tablet by mouth daily.    Historical Provider, MD  losartan (COZAAR) 25 MG tablet Take 25 mg by mouth daily. 08/16/13   Historical Provider, MD  montelukast (SINGULAIR) 10 MG tablet Take 10 mg by mouth daily. 08/16/13   Historical Provider, MD  Multiple Vitamin (MULTIVITAMIN) tablet Take 1 tablet by mouth  daily.    Historical Provider, MD  pantoprazole (PROTONIX) 40 MG tablet Take 40 mg by mouth daily.    Historical Provider, MD  travoprost, benzalkonium, (TRAVATAN) 0.004 % ophthalmic solution Place 1 drop into both eyes at bedtime.    Historical Provider, MD   BP 162/66  Pulse 78  Temp(Src) 98.6 F (37 C) (Oral)  Resp 16  SpO2 95% Physical Exam CONSTITUTIONAL: Well developed/well nourished HEAD: Normocephalic/atraumatic EYES: EOMI/PERRL ENMT: Mucous membranes moist NECK: supple no meningeal signs SPINE:entire spine nontender CV: S1/S2 noted, no murmurs/rubs/gallops noted LUNGS: Lungs are clear to auscultation bilaterally, no apparent distress ABDOMEN: soft, nontender, no rebound or guarding GU:no cva tenderness NEURO: Pt is awake/alert,mild dysarthria noted.  Mild left UE drift noted.   EXTREMITIES: pulses normal, full ROM SKIN: warm, color normal PSYCH: no abnormalities of mood noted   ED Course  Procedures 12:40 PM Pt was seen on arrival for concern for acute CVA After CT scan, pt seen by neurology dr Nicole Kindred He has cancelled code stroke (symptoms improving) and wishes to have her admitted to medicine for TIA Initial NIHSS at 3 (dysarthria, left UE drift and mild sensory deficit) Pt feels improved Will admit for stroke rule out per neurology  tPA in stroke considered but not given due to:  Rapid improvement/severity mild Code stroke cancelled by dr Wallie Char   12:56 PM D/w internal medicine service, pt will be admitted to their service for TIA Labs Review Labs Reviewed  APTT - Abnormal; Notable for the following:    aPTT 22 (*)    All other components within normal limits  I-STAT CHEM 8, ED - Abnormal; Notable for the following:    BUN 26 (*)    All other components within normal limits  PROTIME-INR  CBC  DIFFERENTIAL  ETHANOL  COMPREHENSIVE METABOLIC PANEL  URINE RAPID DRUG SCREEN ([place])  [place], ROUTINE W REFLEX MICROSCOPIC  I-STAT  [place], ED  I-STAT [place], ED    Imaging Review Ct Head Wo Contrast  05/25/2014   CLINICAL DATA:  Code stroke, left-sided paralysis  EXAM: CT HEAD WITHOUT CONTRAST  TECHNIQUE: Contiguous axial images were obtained from the base of the skull through the vertex without intravenous contrast.  COMPARISON:  MRI brain 09/14/2013  FINDINGS: There is no evidence of mass effect, midline shift, or extra-axial fluid collections. There is no evidence of a space-occupying lesion or intracranial hemorrhage. There is no evidence of a cortical-based area of acute infarction. There is generalized cerebral atrophy. There is periventricular white matter low attenuation likely secondary to microangiopathy.  The ventricles and sulci are appropriate for the patient's age. The basal cisterns are patent.  Visualized portions of the orbits are unremarkable. The visualized portions of the paranasal sinuses and mastoid  air cells are unremarkable. Cerebrovascular atherosclerotic calcifications are noted.  The osseous structures are unremarkable.  IMPRESSION: No acute intracranial pathology.   Electronically Signed   By: Kathreen Devoid   On: 05/25/2014 12:20     EKG Interpretation   Date/Time:  Friday May 25 2014 12:16:57 EDT Ventricular Rate:  78 PR Interval:  224 QRS Duration: 105 QT Interval:  424 QTC Calculation: 483 R Axis:   -62 Text Interpretation:  Sinus rhythm Prolonged PR interval Left anterior  fascicular block Consider anterior infarct No significant change since  last tracing Confirmed by Christy Gentles  MD, Elenore Rota ([id number]) on 05/25/2014  12:37:15 PM      MDM   Final diagnoses:  Transient cerebral ischemia, unspecified transient cerebral ischemia type    Nursing notes including past medical history and social history reviewed and considered in documentation Labs/vital reviewed and considered     Sharyon Cable, MD 05/25/14 1257

## 2014-05-25 NOTE — ED Notes (Signed)
Stroke team at bedside

## 2014-05-25 NOTE — Progress Notes (Signed)
Pt. Has arrived on the unit.

## 2014-05-25 NOTE — ED Notes (Signed)
Pt here from [facility] as a code stroke , pt was LSN at 44 , pt presents with left sided weakness and slurred speech , pt has hx of stroke

## 2014-05-26 ENCOUNTER — Observation Stay (HOSPITAL_COMMUNITY): Payer: Medicare Other

## 2014-05-26 DIAGNOSIS — I519 Heart disease, unspecified: Secondary | ICD-10-CM

## 2014-05-26 DIAGNOSIS — E785 Hyperlipidemia, unspecified: Secondary | ICD-10-CM

## 2014-05-26 DIAGNOSIS — R5381 Other malaise: Secondary | ICD-10-CM

## 2014-05-26 DIAGNOSIS — R5383 Other fatigue: Secondary | ICD-10-CM

## 2014-05-26 DIAGNOSIS — K219 Gastro-esophageal reflux disease without esophagitis: Secondary | ICD-10-CM

## 2014-05-26 DIAGNOSIS — F341 Dysthymic disorder: Secondary | ICD-10-CM

## 2014-05-26 LAB — BASIC METABOLIC PANEL
Anion gap: 9 (ref 5–15)
BUN: 26 mg/dL — ABNORMAL HIGH (ref 6–23)
CALCIUM: 10 mg/dL (ref 8.4–10.5)
CO2: 28 mEq/L (ref 19–32)
Chloride: 102 mEq/L (ref 96–112)
Creatinine, Ser: 1.07 mg/dL (ref 0.50–1.10)
GFR calc non Af Amer: 47 mL/min — ABNORMAL LOW (ref >90)
GFR, EST AFRICAN AMERICAN: 54 mL/min — AB (ref >90)
Glucose, Bld: 128 mg/dL — ABNORMAL HIGH (ref 70–99)
POTASSIUM: 4.6 meq/L (ref 3.7–5.3)
Sodium: 139 mEq/L (ref 137–147)

## 2014-05-26 LAB — LIPID PANEL
CHOL/HDL RATIO: 5.3 ratio
Cholesterol: 176 mg/dL (ref 0–200)
HDL: 33 mg/dL — ABNORMAL LOW (ref >39)
LDL CALC: 99 mg/dL (ref 0–99)
Triglycerides: 220 mg/dL — ABNORMAL HIGH (ref <150)
VLDL: 44 mg/dL — ABNORMAL HIGH (ref 0–40)

## 2014-05-26 LAB — VITAMIN B12: Vitamin B-12: 813 pg/mL (ref 211–911)

## 2014-05-26 LAB — TROPONIN I: Troponin I: <0.3 ng/mL (ref <0.30)

## 2014-05-26 LAB — HEMOGLOBIN A1C
Hgb A1c MFr Bld: 6.2 % — ABNORMAL HIGH (ref <5.7)
Mean Plasma Glucose: 131 mg/dL — ABNORMAL HIGH (ref <117)

## 2014-05-26 LAB — T4, FREE: FREE T4: 1.16 ng/dL (ref 0.80–1.80)

## 2014-05-26 LAB — TSH: TSH: 2.77 u[IU]/mL (ref 0.350–4.500)

## 2014-05-26 MED ORDER — FENOFIBRATE 160 MG PO TABS
160.0000 mg | ORAL_TABLET | Freq: Every day | ORAL | Status: DC
  Administered 2014-05-26 – 2014-05-28 (×3): 160 mg via ORAL
  Filled 2014-05-26 (×3): qty 1

## 2014-05-26 MED ORDER — ATORVASTATIN CALCIUM 80 MG PO TABS
80.0000 mg | ORAL_TABLET | Freq: Every day | ORAL | Status: DC
  Administered 2014-05-26: 80 mg via ORAL
  Filled 2014-05-26: qty 1

## 2014-05-26 MED ORDER — LATANOPROST 0.005 % OP SOLN
1.0000 [drp] | Freq: Every day | OPHTHALMIC | Status: DC
  Administered 2014-05-26 – 2014-05-27 (×2): 1 [drp] via OPHTHALMIC
  Filled 2014-05-26: qty 2.5

## 2014-05-26 MED ORDER — GEMFIBROZIL 600 MG PO TABS
600.0000 mg | ORAL_TABLET | Freq: Two times a day (BID) | ORAL | Status: DC
  Filled 2014-05-26 (×2): qty 1

## 2014-05-26 MED ORDER — GEMFIBROZIL 600 MG PO TABS: 600.0000 mg | ORAL_TABLET | Freq: Two times a day (BID) | ORAL | Status: DC

## 2014-05-26 NOTE — Progress Notes (Signed)
Echo Lab  2D Echocardiogram completed.  [place], RDCS 05/26/2014 10:09 AM

## 2014-05-26 NOTE — Evaluation (Addendum)
Occupational Therapy Evaluation Patient Details Name: [name] MRN: [id number] DOB: July 29, 1930 Today's Date: 05/26/2014    History of Present Illness Admitted with dysarthria and worsening L sided weakness.  PMHx-- parkinsons, CVA '03, multiple TIA's   Clinical Impression   Pt admitted with above. Pt independent with ADLs, PTA. Feel pt will benefit from acute OT to increase independence and safety prior to d/c. Recommending HHOT for d/c.     Follow Up Recommendations  Home health OT    Equipment Recommendations  None recommended by OT    Recommendations for Other Services       Precautions / Restrictions Precautions Precautions: Fall Restrictions Weight Bearing Restrictions: No      Mobility Bed Mobility             General bed mobility comments: sitting EOB  Transfers Overall transfer level: Needs assistance Equipment used: Rolling walker (2 wheeled) Transfers: Sit to/from Stand Sit to Stand: Supervision         General transfer comment: used good technique,  Mild struggle         ADL Overall ADL's : Needs assistance/impaired                     Lower Body Dressing: Supervision/safety;Sit to/from stand   Toilet Transfer: Min guard;Ambulation;RW;Comfort height toilet   Toileting- Clothing Manipulation and Hygiene: Supervision/safety;Sit to/from stand       Functional mobility during ADLs: Min guard;Rolling walker General ADL Comments: Educated on signs/symptoms of stroke and importance of getting help right away. Recommended to avoid canned foods/increased sodium as it increases chance for stroke. Educated on safety tips for home (using bag/basket on walker, sitting for LB ADLs). Recommended pt practice shower transfer with [facility] therapist as she typically goes in bathroom at home without walker. Educated on fine motor exercise she can be doing with left hand (opposition) and encouraged pt to be using left hand for activities and listed  fine motor activities she could be doing. Told pt to be careful around hot surfaces/sharp or dangerous objects due to decreased sensation in LUE. Educated on dressing technique.     Vision                     Perception     Praxis      Pertinent Vitals/Pain No pain reported.      Hand Dominance     Extremity/Trunk Assessment Upper Extremity Assessment Upper Extremity Assessment: LUE deficits/detail LUE Deficits / Details: 2+/5 shoulder flexion-residual weakness from prior stroke, but worse than normal LUE Sensation: decreased light touch LUE Coordination: decreased fine motor   Lower Extremity Assessment Lower Extremity Assessment: Defer to PT evaluation RLE Deficits / Details: general quad weakness, ham and hip flexor weakness on the "good" side. LLE Deficits / Details: quads 4-/5, hams 4-/5 and hip flexors 3+/5 LLE Sensation: decreased light touch LLE Coordination: decreased fine motor       Communication Communication Communication: No difficulties   Cognition Arousal/Alertness: Awake/alert Behavior During Therapy: WFL for tasks assessed/performed Overall Cognitive Status: Within Functional Limits for tasks assessed                     General Comments       Exercises       Shoulder Instructions      Home Living Family/patient expects to be discharged to:: Assisted living Living Arrangements: Spouse/significant other  Home Equipment: Walker - 2 wheels   Additional Comments: walk in shower, shower chair      Prior Functioning/Environment Level of Independence: Independent with assistive device(s)             OT Diagnosis: Generalized weakness; hemiparesis non-dominant side   OT Problem List: Decreased strength;Impaired sensation;Decreased knowledge of precautions;Decreased knowledge of use of DME or AE;Decreased safety awareness;Decreased coordination;Impaired UE functional use;Impaired balance  (sitting and/or standing)   OT Treatment/Interventions: Self-care/ADL training;Therapeutic exercise;DME and/or AE instruction;Therapeutic activities;Neuromuscular education;Patient/family education;Balance training    OT Goals(Current goals can be found in the care plan section) Acute Rehab OT Goals Patient Stated Goal: not stated OT Goal Formulation: With patient Time For Goal Achievement: 06/02/14 Potential to Achieve Goals: Good ADL Goals Pt Will Perform Lower Body Bathing: with modified independence;sit to/from stand Pt Will Perform Lower Body Dressing: with modified independence;sit to/from stand (including gathering supplies) Pt Will Transfer to Toilet: with modified independence;ambulating  OT Frequency: Min 2X/week   Barriers to D/C:            Co-evaluation              End of Session Equipment Utilized During Treatment: Rolling walker  Activity Tolerance: Patient tolerated treatment well Patient left: with call bell/phone within reach;in chair;with chair alarm set;with family/visitor present   Time: 6269-4854 OT Time Calculation (min): 27 min Charges:  OT General Charges $OT Visit: 1 Procedure OT Evaluation $Initial OT Evaluation Tier I: 1 Procedure OT Treatments $Therapeutic Activity: 8-22 mins G-Codes: OT G-codes **NOT FOR INPATIENT CLASS** Functional Assessment Tool Used: clinical judgment Functional Limitation: Self care Self Care Current Status (O2703): At least 1 percent but less than 20 percent impaired, limited or restricted Self Care Goal Status (J0093): 0 percent impaired, limited or restricted  Benito Mccreedy OTR/L [phone] 05/26/2014, 6:27 PM

## 2014-05-26 NOTE — Progress Notes (Signed)
SLP Cancellation Note  Patient Details Name: [name] MRN: [id number] DOB: 04/23/1930   Cancelled treatment:       Reason Eval/Treat Not Completed: SLP screened, no needs identified, will sign off. MRI without acute infarct, and pt reports her symptoms have primarily resolved except for her weakness. Husband and son present both confirm that she is at her cognitive-linguistic baseline.     Germain Osgood, M.A. CCC-SLP [phone]  Germain Osgood 05/26/2014, 11:44 AM

## 2014-05-26 NOTE — Progress Notes (Signed)
Internal Medicine Attending Admission Note Date: 05/26/2014  Patient name: [name] Medical record number: [id number] Date of birth: 03/04/1930 Age: 80 y.o. Gender: female  I saw and evaluated the patient. I reviewed the resident's note and I agree with the resident's findings and plan as documented in the resident's note.  Chief Complaint(s): Slurred speech and left upper extremity weakness  History - key components related to admission:  Ms. Batta is an 80 year old woman with a history of previous cerebrovascular accident in 2003, multiple TIAs, Parkinson's disease, depression, hyperlipidemia, and grade 1 diastolic dysfunction who presents to the emergency department with the acute onset of left-sided weakness and dysarthria. Apparently, she was talking to her daughter on the phone and became confused as well as began slurring her speech. Staff called EMS and on their arrival noted left-sided weakness. She was brought to the [facility] emergency department where she was noted to have normalized her speech and left-sided strength. She was admitted to the internal medicine teaching service for further evaluation and care.  On rounds this morning she was without complaints stating that her strength had returned to normal and she had no difficulty with her speech.  Physical Exam - key components related to admission:  Filed Vitals:   05/25/14 2200 05/26/14 0000 05/26/14 0213 05/26/14 0418  BP: 129/71 125/58 123/59 122/63  Pulse: 84 65 64 63  Temp: 97.7 F (36.5 C) 98.8 F (37.1 C) 98.6 F (37 C) 97.7 F (36.5 C)  TempSrc: Oral Oral Oral Oral  Resp: 16 14 14 16   Height:      Weight:      SpO2: 93% 92% 92% 90%   General: Well-developed, well-nourished, woman lying comfortably in bed in no acute distress. Neuro: No slurring of her speech. Cranial nerve II through XII intact. Sensation intact. Strength in all 4 extremities was 5/5 although grip in the left hand was mildly decreased  and possibly related to effort.  Lab results:  Basic Metabolic Panel:  Recent Labs  05/25/14 1204 05/25/14 1216 05/26/14 0011  NA 141 140 139  K 4.1 3.9 4.6  CL 102 103 102  CO2 26  --  28  GLUCOSE 89 91 128*  BUN 25* 26* 26*  CREATININE 0.89 1.10 1.07  CALCIUM 9.9  --  10.0   Liver Function Tests:  Recent Labs  05/25/14 1204  AST 26  ALT 9  ALKPHOS 40  BILITOT 0.3  PROT 7.3  ALBUMIN 4.0   CBC:  Recent Labs  05/25/14 1204 05/25/14 1216  WBC 9.5  --   NEUTROABS 5.3  --   HGB 12.7 13.6  HCT 39.5 40.0  MCV 93.2  --   PLT 354  --    Cardiac Enzymes:  Recent Labs  05/25/14 1828 05/26/14 0011 05/26/14 0638  TROPONINI <0.30 <0.30 <0.30   Fasting Lipid Panel:  Recent Labs  05/26/14 0011  CHOL 176  HDL 33*  LDLCALC 99  TRIG 220*  CHOLHDL 5.3   Coagulation:  Recent Labs  05/25/14 1204  INR 1.02   Urine Drug Screen:  Positive for benzodiazepines otherwise negative  Alcohol Level:  Recent Labs  05/25/14 1204  ETH <11   Urinalysis:  Moderate hemoglobin, moderate leukocytes, 3-6 red blood cells per high-power field, 7-10 white blood cells per high-power field.  Misc. Labs:  Hemoglobin A1c pending  Imaging results:  Dg Chest 2 View  05/26/2014   CLINICAL DATA:  Stroke.  EXAM: CHEST  2 VIEW  COMPARISON:  Chest x-ray 01/14/2010.  FINDINGS: Lung volumes are normal. No consolidative airspace disease. No pleural effusions. No pneumothorax. No pulmonary nodule or mass noted. Pulmonary vasculature and the cardiomediastinal silhouette are within normal limits. Atherosclerosis in the thoracic aorta.  IMPRESSION: 1.  No radiographic evidence of acute cardiopulmonary disease. 2. Atherosclerosis.   Electronically Signed   By: Vinnie Langton M.D.   On: 05/26/2014 09:39   Ct Head Wo Contrast  05/25/2014   CLINICAL DATA:  Code stroke, left-sided paralysis  EXAM: CT HEAD WITHOUT CONTRAST  TECHNIQUE: Contiguous axial images were obtained from the base  of the skull through the vertex without intravenous contrast.  COMPARISON:  MRI brain 09/14/2013  FINDINGS: There is no evidence of mass effect, midline shift, or extra-axial fluid collections. There is no evidence of a space-occupying lesion or intracranial hemorrhage. There is no evidence of a cortical-based area of acute infarction. There is generalized cerebral atrophy. There is periventricular white matter low attenuation likely secondary to microangiopathy.  The ventricles and sulci are appropriate for the patient's age. The basal cisterns are patent.  Visualized portions of the orbits are unremarkable. The visualized portions of the paranasal sinuses and mastoid air cells are unremarkable. Cerebrovascular atherosclerotic calcifications are noted.  The osseous structures are unremarkable.  IMPRESSION: No acute intracranial pathology.   Electronically Signed   By: Kathreen Devoid   On: 05/25/2014 12:20   Mr Brain Wo Contrast  05/25/2014   CLINICAL DATA:  Slurred speech. Left-sided hemiplegia. Evaluate for stroke.  EXAM: MRI HEAD WITHOUT CONTRAST  MRA HEAD WITHOUT CONTRAST  TECHNIQUE: Multiplanar, multiecho pulse sequences of the brain and surrounding structures were obtained without intravenous contrast. Angiographic images of the head were obtained using MRA technique without contrast.  COMPARISON:  Prior CT from earlier the same day as well as prior MRI from 09/14/2013  FINDINGS: MRI HEAD FINDINGS  Diffuse prominence of the CSF containing spaces is compatible with generalized cerebral atrophy. Extensive scattered and confluent T2/FLAIR hyperintensity within the periventricular and deep white matter both cerebral hemispheres is most consistent with chronic small vessel ischemic changes. Similar changes are seen within the pons.  No mass lesion, midline shift, or extra-axial fluid collection. Ventricles are normal in size without evidence of hydrocephalus.  No diffusion-weighted signal abnormality is identified  to suggest acute intracranial infarct. Gray-white matter differentiation is maintained. Normal flow voids are seen within the intracranial vasculature. No intracranial hemorrhage identified.  The cervicomedullary junction is normal. Grade 1 anterolisthesis of C3 on C4 and C4 on C5 with associated degenerative changes noted within the partially visualized upper cervical spine.  Pituitary gland is within normal limits. Pituitary stalk is midline. The globes and optic nerves demonstrate a normal appearance with normal signal intensity. The  The bone marrow signal intensity is normal. Calvarium is intact. Visualized upper cervical spine is within normal limits.  Scalp soft tissues are unremarkable.  Paranasal sinuses are clear.  No mastoid effusion.  MRA HEAD FINDINGS  The visualized portions of the distal cervical segments of the internal carotid arteries are widely patent with antegrade flow. Mild multi focal atherosclerotic irregularity present within the cavernous segments of the internal carotid arteries bilaterally without hemodynamically significant stenosis. Supra clinoid segments are widely patent and normal in appearance. The A1 segments are symmetric bilaterally. Anterior communicating artery is normal. Anterior cerebral arteries are well opacified.  M1 segments are with widely patent bilaterally without proximal branch occlusion. Short-segment stenosis of approximately 30% present within the mid left M1 segment (  series 503, image 10). MCA branches are well opacified distally. No proximal branch occlusion or aneurysm within the anterior circulation.  The right vertebral artery is dominant. The right posterior inferior cerebral artery is patent. The left posterior inferior cerebral artery is not well visualized. Vertebrobasilar junction and basilar artery are within normal limits. No basilar tip stenosis or aneurysm. Superior cerebellar arteries are symmetric bilaterally and well opacified. Fetal origin of the  right posterior cerebral artery noted. There is a short-segment stenosis of at approximately 70% within the distal right P2 segment (series 506, image 12). Left P1 and P2 segments are well opacified. No occlusive thrombus or hemodynamically significant stenosis seen within the posterior circulation.  IMPRESSION: MRI HEAD IMPRESSION:  1. No acute intracranial infarct or other abnormality identified. 2. Generalized age-related atrophy with advanced chronic microvascular ischemic changes involving the supratentorial white matter and pons.  MRA HEAD IMPRESSION:  1. No proximal branch occlusion identified within the intracranial circulation. 2. Short segment stenosis of approximately 30% within the mid -distal left M1 segment. 3. Short segment stenosis of approximately 70% within the distal right P2 segment. No other hemodynamically significant stenosis identified within the intracranial circulation. 4. Fetal origin of the right PCA.   Electronically Signed   By: Jeannine Boga M.D.   On: 05/25/2014 22:15   Mr [name] Head/brain Wo Cm  05/25/2014   CLINICAL DATA:  Slurred speech. Left-sided hemiplegia. Evaluate for stroke.  EXAM: MRI HEAD WITHOUT CONTRAST  MRA HEAD WITHOUT CONTRAST  TECHNIQUE: Multiplanar, multiecho pulse sequences of the brain and surrounding structures were obtained without intravenous contrast. Angiographic images of the head were obtained using MRA technique without contrast.  COMPARISON:  Prior CT from earlier the same day as well as prior MRI from 09/14/2013  FINDINGS: MRI HEAD FINDINGS  Diffuse prominence of the CSF containing spaces is compatible with generalized cerebral atrophy. Extensive scattered and confluent T2/FLAIR hyperintensity within the periventricular and deep white matter both cerebral hemispheres is most consistent with chronic small vessel ischemic changes. Similar changes are seen within the pons.  No mass lesion, midline shift, or extra-axial fluid collection. Ventricles  are normal in size without evidence of hydrocephalus.  No diffusion-weighted signal abnormality is identified to suggest acute intracranial infarct. Gray-white matter differentiation is maintained. Normal flow voids are seen within the intracranial vasculature. No intracranial hemorrhage identified.  The cervicomedullary junction is normal. Grade 1 anterolisthesis of C3 on C4 and C4 on C5 with associated degenerative changes noted within the partially visualized upper cervical spine.  Pituitary gland is within normal limits. Pituitary stalk is midline. The globes and optic nerves demonstrate a normal appearance with normal signal intensity. The  The bone marrow signal intensity is normal. Calvarium is intact. Visualized upper cervical spine is within normal limits.  Scalp soft tissues are unremarkable.  Paranasal sinuses are clear.  No mastoid effusion.  MRA HEAD FINDINGS  The visualized portions of the distal cervical segments of the internal carotid arteries are widely patent with antegrade flow. Mild multi focal atherosclerotic irregularity present within the cavernous segments of the internal carotid arteries bilaterally without hemodynamically significant stenosis. Supra clinoid segments are widely patent and normal in appearance. The A1 segments are symmetric bilaterally. Anterior communicating artery is normal. Anterior cerebral arteries are well opacified.  M1 segments are with widely patent bilaterally without proximal branch occlusion. Short-segment stenosis of approximately 30% present within the mid left M1 segment (series 503, image 10). MCA branches are well opacified distally. No proximal branch  occlusion or aneurysm within the anterior circulation.  The right vertebral artery is dominant. The right posterior inferior cerebral artery is patent. The left posterior inferior cerebral artery is not well visualized. Vertebrobasilar junction and basilar artery are within normal limits. No basilar tip  stenosis or aneurysm. Superior cerebellar arteries are symmetric bilaterally and well opacified. Fetal origin of the right posterior cerebral artery noted. There is a short-segment stenosis of at approximately 70% within the distal right P2 segment (series 506, image 12). Left P1 and P2 segments are well opacified. No occlusive thrombus or hemodynamically significant stenosis seen within the posterior circulation.  IMPRESSION: MRI HEAD IMPRESSION:  1. No acute intracranial infarct or other abnormality identified. 2. Generalized age-related atrophy with advanced chronic microvascular ischemic changes involving the supratentorial white matter and pons.  MRA HEAD IMPRESSION:  1. No proximal branch occlusion identified within the intracranial circulation. 2. Short segment stenosis of approximately 30% within the mid -distal left M1 segment. 3. Short segment stenosis of approximately 70% within the distal right P2 segment. No other hemodynamically significant stenosis identified within the intracranial circulation. 4. Fetal origin of the right PCA.   Electronically Signed   By: Jeannine Boga M.D.   On: 05/25/2014 22:15   Other results:  EKG: Normal sinus rhythm, first degree AV block, left anterior hemiblock, left axis deviation, poor R wave progression, no significant Q waves or LVH, no ST segment changes, T wave flattening in leads V2 through V3. No comparisons immediately available.  Assessment & Plan by Problem:  Ms. Giese is an 80 year old woman with a history of CVA, TIAs, Parkinson's disease, depression, dyslipidemia, and grade 1 diastolic dysfunction who presents with the acute onset of slurring of speech and left-sided weakness. These symptoms resolved by the time she reached the emergency department. Imaging, including MRI, failed to demonstrate an acute cerebrovascular accident. Therefore, she likely suffered a transient ischemic attack. Of note, she is on aspirin 325 mg by mouth daily and  Plavix 75 mg by mouth daily. She apparently has not been on a statin. She is currently admitted for observation and completion of her stroke workup.  1) Transient ischemic attack: No residual neurologic deficits on examination. She will be maintained on her aspirin 325 mg by mouth daily and Plavix 75 mg by mouth daily. We will add atorvastatin in order to stabilize any atherosclerotic plaques that may have contributed to the TIA. We will also assess for diabetes and a hemoglobin A1c is pending at this time. Finally, we will continue to manage her chronic hypertension. An echocardiogram has been completed and the read is pending at this time. We are also waiting for carotid Dopplers to be completed. Once this is done she is stable for discharge back to her assisted living facility as early as today. We can do so pending the results of the echo and carotid Dopplers and follow these up if they are not back by the time of discharge.  2) Disposition: Awaiting completion of the carotid Dopplers. Otherwise I anticipate transfer back to her assisted living facility today.

## 2014-05-26 NOTE — Care Management Utilization Note (Signed)
UR completed.    Saniah Schroeter [place] Theiss, RN, BSN Phone #[phone]  

## 2014-05-26 NOTE — Progress Notes (Signed)
Stroke Team Progress Note  HISTORY [name] is an 80 y.o. female who lives in assisted living. Patient was on the phone with her son when staff noted she was slurring her words. EMS was called. On scene they noted left sided hemiplegia. Code stroke was activated. On arrival to ED patient initially would not lift arm off the bed but when staff was placing her gown on, she was able to lift the left arm and leg. Speech was muffled but patient would not fully open her mouth to articulate. Initial CT head was negative for acute stroke or bleed. As patient was further examined patient showed effort dependent strength. Patient states her left arm and leg decreased sensation is not new.    Date last known well: Date: 05/25/2014  Time last known well: Time: 11:30  tPA Given: No: resolving and minimal symptoms   SUBJECTIVE The patient's son and husband are at the bedside. The patient's husband has mild to moderate dementia. The patient admits to being under a lot of stress as the primary caregiver. Her husband is having back surgery in the near future. She reports that she had a previous stroke in 2003 with residual left hemiparesis.  The patient has followed by Dr. Floyde Parkins for Parkinson's disease. She has described presyncope and TIA like symptoms to Dr. Jannifer Franklin in the past. She was sent to cardiology to have a cardionet monitor placed; however, the patient declined as she felt the wiring was too complicated.   OBJECTIVE Most recent Vital Signs: Filed Vitals:   05/26/14 0000 05/26/14 0213 05/26/14 0418 05/26/14 1050  BP: 125/58 123/59 122/63 130/50  Pulse: 65 64 63 76  Temp: 98.8 F (37.1 C) 98.6 F (37 C) 97.7 F (36.5 C) 98.8 F (37.1 C)  TempSrc: Oral Oral Oral Oral  Resp: 14 14 16 18   Height:      Weight:      SpO2: 92% 92% 90% 92%   CBG (last 3)  No results found for this basename: GLUCAP,  in the last 72 hours  IV Fluid Intake:     MEDICATIONS  . ALPRAZolam  0.5 mg  Oral TID  . aspirin EC  325 mg Oral Daily  . atorvastatin  80 mg Oral q1800  . carbidopa-levodopa  1 tablet Oral TID AC  . citalopram  30 mg Oral QHS  . clopidogrel  75 mg Oral QHS  . fenofibrate  160 mg Oral Daily  . heparin  5,000 Units Subcutaneous 3 times per day  . pantoprazole  40 mg Oral Daily   PRN:  senna-docusate  Diet:  Cardiac thin liquids Activity:  Up with assistance DVT Prophylaxis:  Subcutaneous heparin  CLINICALLY SIGNIFICANT STUDIES Basic Metabolic Panel:  Recent Labs Lab 05/25/14 1204 05/25/14 1216 05/26/14 0011  NA 141 140 139  K 4.1 3.9 4.6  CL 102 103 102  CO2 26  --  28  GLUCOSE 89 91 128*  BUN 25* 26* 26*  CREATININE 0.89 1.10 1.07  CALCIUM 9.9  --  10.0   Liver Function Tests:  Recent Labs Lab 05/25/14 1204  AST 26  ALT 9  ALKPHOS 40  BILITOT 0.3  PROT 7.3  ALBUMIN 4.0   CBC:  Recent Labs Lab 05/25/14 1204 05/25/14 1216  WBC 9.5  --   NEUTROABS 5.3  --   HGB 12.7 13.6  HCT 39.5 40.0  MCV 93.2  --   PLT 354  --    Coagulation:  Recent  Labs Lab 05/25/14 1204  LABPROT 13.4  INR 1.02   Cardiac Enzymes:  Recent Labs Lab 05/25/14 1828 05/26/14 0011 05/26/14 0638  TROPONINI <0.30 <0.30 <0.30   Urinalysis:  Recent Labs Lab 05/25/14 1300  COLORURINE YELLOW  LABSPEC 1.020  PHURINE 6.0  GLUCOSEU NEGATIVE  HGBUR MODERATE*  BILIRUBINUR NEGATIVE  KETONESUR NEGATIVE  PROTEINUR NEGATIVE  UROBILINOGEN 0.2  NITRITE NEGATIVE  LEUKOCYTESUR MODERATE*   Lipid Panel    Component Value Date/Time   CHOL 176 05/26/2014 0011   TRIG 220* 05/26/2014 0011   HDL 33* 05/26/2014 0011   CHOLHDL 5.3 05/26/2014 0011   VLDL 44* 05/26/2014 0011   LDLCALC 99 05/26/2014 0011   HgbA1C  Lab Results  Component Value Date   HGBA1C  Value: 6.7 (NOTE) The ADA recommends the following therapeutic goal for glycemic control related to Hgb A1c measurement: Goal of therapy: <6.5 Hgb A1c  Reference: American Diabetes Association: Clinical Practice  Recommendations 2010, Diabetes Care, 2010, 33: (Suppl  1).* 01/14/2010    Urine Drug Screen:     Component Value Date/Time   LABOPIA NONE DETECTED 05/25/2014 1300   COCAINSCRNUR NONE DETECTED 05/25/2014 1300   LABBENZ POSITIVE* 05/25/2014 1300   AMPHETMU NONE DETECTED 05/25/2014 1300   THCU NONE DETECTED 05/25/2014 1300   LABBARB NONE DETECTED 05/25/2014 1300    Alcohol Level:  Recent Labs Lab 05/25/14 [place] <11    Dg Chest 2 View 05/26/2014    1.  No radiographic evidence of acute cardiopulmonary disease.  2. Atherosclerosis.     Ct Head Wo Contrast 05/25/2014    No acute intracranial pathology.     Mr Brain Wo Contrast 05/25/2014    1. No acute intracranial infarct or other abnormality identified.  2. Generalized age-related atrophy with advanced chronic microvascular ischemic changes involving the supratentorial white matter and pons.    MRA HEAD IMPRESSION:   1. No proximal branch occlusion identified within the intracranial circulation.  2. Short segment stenosis of approximately 30% within the mid -distal left M1 segment.  3. Short segment stenosis of approximately 70% within the distal right P2 segment. No other hemodynamically significant stenosis identified within the intracranial circulation.  4. Fetal origin of the right PCA.    Carotid Doppler  pending  2D Echocardiogram  - Left ventricle: The cavity size was normal. Wall thickness was normal. Systolic function was normal. The estimated ejection fraction was in the range of 55% to 60%. Wall motion was normal; there were no regional wall motion abnormalities. Doppler parameters are consistent with abnormal left ventricular relaxation (grade 1 diastolic dysfunction). - Right ventricle: The cavity size was normal. Wall thickness was mildly increased. - Pulmonary arteries: Systolic pressure was mildly increased.  Impressions:  - Normal LV function; prominent epicardial fat pad, mildly elevated pulmonary  pressures.  CXR  1. No radiographic evidence of acute cardiopulmonary disease.  2. Atherosclerosis.  EKG - Sinus rhythm rate 73 beats per minute. For complete results please see formal report.   Therapy Recommendations home PT/OT  Physical Exam     Mental Status:  Alert, oriented, thought content appropriate. Speech without evidence of aphasia. Able to follow 3 step commands without difficulty.  Cranial Nerves:  II: Discs flat bilaterally; Visual fields grossly normal, pupils equal, round, reactive to light and accommodation  III,IV, VI: ptosis not present, extra-ocular motions intact bilaterally  V,VII: mild left nasolabial fold flattening, facial light touch sensation normal bilaterally  VIII: hearing normal bilaterally  IX,X: gag  reflex present  XI: bilateral shoulder shrug  XII: midline tongue extension without atrophy or fasciculations  Motor:  Right : Upper extremity 5/5 Left: Upper extremity 4/5 - drift but not pronator drift, with distraction, no drift, indicating functional component Lower extremity 5/5 Lower extremity 4/5, hoover's sign positive --left arm and leg was effort dependent, indicating functional component Tone and bulk:normal tone throughout; no atrophy noted  Sensory: Pinprick and light touch decreased on the left arm and leg--patient states this is old.  Deep Tendon Reflexes:  Right: Upper Extremity Left: Upper extremity  biceps (C-5 to C-6) 2/4 biceps (C-5 to C-6) 2/4  tricep (C7) 2/4 triceps (C7) 2/4  Brachioradialis (C6) 2/4 Brachioradialis (C6) 2/4  Lower Extremity Lower Extremity  quadriceps (L-2 to L-4) 1/4 quadriceps (L-2 to L-4) 1/4  Achilles (S1) 1/4 Achilles (S1) 1/4  Plantars:  Right: downgoing Left: downgoing  Cerebellar:  normal finger-to-nose, normal heel-to-shin test  Gait: not tested  CV: pulses palpable throughout   ASSESSMENT Ms. [name] is a 80 y.o. female presenting with transient left hemiparesis and dysarthria. T-PA  therapy was not initiated as the patient's deficits were resolving. A CT of the head and an MRI were both negative for acute findings. There is functional component on neuro exam, she also has a lot of stress recently. Felt like this episode is more stress related but not able to completely rule out TIA. Will continue stroke work up and stroke risk factor modification as well as stroke prevention.  On aspirin 325 mg orally every day and clopidogrel 75 mg orally every day prior to admission. Now on aspirin 325 mg orally every day and clopidogrel 75 mg orally every day for secondary stroke prevention. Patient with resultant resolution of deficits. Stroke work up underway.    Previous stroke in 2003 with mild residual left hemiparesis.  History of presyncope and questionable TIAs.  Dyslipidemia - currently on Lipitor 80 mg daily - cholesterol 176; LDL 99  Parkinson's disease followed by Dr. Floyde Parkins  Hypertension history  Hemoglobin A1c pending  Recent increased stress  Hospital day # 1  TREATMENT/PLAN  Continue aspirin 325 mg orally every day and clopidogrel 75 mg orally every day for secondary stroke and cardiac prevention.  Recommend loop recorder implant per cardiology Monday.  Check orthostatic blood pressures.  Continue statin for HLD and stroke prevention  Await hemoglobin A1c, and carotid Dopplers.  Await therapy evaluations.  Mikey Bussing PA-C [facility] Pager [phone] 05/26/2014, 12:11 PM   SIGNED I, the attending vascular neurologist, have personally obtained a history, examined the patient, evaluated laboratory data, individually viewed imaging studies, and formulated the assessment and plan of care.  I have made any additions or clarifications directly to the above note and agree with the findings and plan as currently documented.   Rosalin Hawking, MD PhD 05/26/2014 8:37 PM   To contact Stroke Continuity provider, please refer to  [email or web address] After hours, contact General Neurology

## 2014-05-26 NOTE — Evaluation (Signed)
Physical Therapy Evaluation Patient Details Name: [name] MRN: [id number] DOB: 29-Dec-1929 Today's Date: 05/26/2014   History of Present Illness  Admitted with dysarthria and worsening L sided weakness.  PMHx-- parkinsons, CVA '03, multiple TIA's  Clinical Impression  Pt admitted with/for s/s of stroke--MRI (-).  Pt currently limited functionally due to the problems listed below.  (see problems list.)  Pt will benefit from PT to maximize function and safety to be able to get home safely with caregiver assist .     Follow Up Recommendations Home health PT    Equipment Recommendations  None recommended by PT    Recommendations for Other Services       Precautions / Restrictions Precautions Precautions: Fall      Mobility  Bed Mobility Overal bed mobility: Modified Independent             General bed mobility comments: moved slowly and used rail, but has rail on her bed.  Transfers Overall transfer level: Needs assistance Equipment used: Rolling walker (2 wheeled) Transfers: Sit to/from Stand Sit to Stand: Supervision         General transfer comment: used good technique,  Mild struggle  Ambulation/Gait Ambulation/Gait assistance: Min guard Ambulation Distance (Feet): 100 Feet Assistive device: Rolling walker (2 wheeled) Gait Pattern/deviations: Step-through pattern   Gait velocity interpretation: Below normal speed for age/gender General Gait Details: slow, but generally steady with safe use of the RW  Stairs            Wheelchair Mobility    Modified Rankin (Stroke Patients Only) Modified Rankin (Stroke Patients Only) Pre-Morbid Rankin Score: No significant disability Modified Rankin: Moderately severe disability     Balance Overall balance assessment: Needs assistance Sitting-balance support: Feet supported;Bilateral upper extremity supported Sitting balance-Leahy Scale: Good                                        Pertinent Vitals/Pain     Home Living Family/patient expects to be discharged to:: Assisted living Living Arrangements: Spouse/significant other             Home Equipment: Walker - 2 wheels      Prior Function Level of Independence: Independent with assistive device(s)               Hand Dominance        Extremity/Trunk Assessment   Upper Extremity Assessment: Defer to OT evaluation           Lower Extremity Assessment: Overall WFL for tasks assessed;Generalized weakness;RLE deficits/detail;LLE deficits/detail RLE Deficits / Details: general quad weakness, ham and hip flexor weakness on the "good" side. LLE Deficits / Details: quads 4-/5, hams 4-/5 and hip flexors 3+/5     Communication   Communication: No difficulties  Cognition Arousal/Alertness: Awake/alert Behavior During Therapy: WFL for tasks assessed/performed Overall Cognitive Status: Within Functional Limits for tasks assessed                      General Comments      Exercises        Assessment/Plan    PT Assessment Patient needs continued PT services  PT Diagnosis Generalized weakness   PT Problem List Decreased strength;Decreased activity tolerance;Decreased balance;Decreased mobility;Decreased coordination  PT Treatment Interventions Gait training;Functional mobility training;Therapeutic activities;Cognitive remediation;Neuromuscular re-education;Balance training   PT Goals (Current goals can be found in the Care  Plan section) Acute Rehab PT Goals Patient Stated Goal: just be able to help my husband as I need to PT Goal Formulation: With patient Time For Goal Achievement: 06/02/14 Potential to Achieve Goals: Good    Frequency Min 2X/week   Barriers to discharge        Co-evaluation               End of Session   Activity Tolerance: Patient tolerated treatment well;Patient limited by fatigue Patient left: in bed;with call bell/phone within reach;with  bed alarm set Nurse Communication: Mobility status    Functional Assessment Tool Used: clinical judgement Functional Limitation: Mobility: Walking and moving around Mobility: Walking and Moving Around Current Status ([id number]): At least 1 percent but less than 20 percent impaired, limited or restricted Mobility: Walking and Moving Around Goal Status [phone]): At least 1 percent but less than 20 percent impaired, limited or restricted    Time: 1545-1610 PT Time Calculation (min): 25 min   Charges:   PT Evaluation $Initial PT Evaluation Tier I: 1 Procedure PT Treatments $Gait Training: 8-22 mins   PT G Codes:   Functional Assessment Tool Used: clinical judgement Functional Limitation: Mobility: Walking and moving around    Laurelyn Terrero, Eliseo Gum 05/26/2014, 4:24 PM 05/26/2014  [place], PT [phone]  (pager)

## 2014-05-26 NOTE — Progress Notes (Signed)
Subjective: Ms. [name] had NAEON. She complained that she got a poor night of sleep and does not like being in the hospital. She denied any recurring dysarthria, weakness, or presyncope.  Objective: Vital signs in last 24 hours: Filed Vitals:   05/25/14 2200 05/26/14 0000 05/26/14 0213 05/26/14 0418  BP: 129/71 125/58 123/59 122/63  Pulse: 84 65 64 63  Temp: 97.7 F (36.5 C) 98.8 F (37.1 C) 98.6 F (37 C) 97.7 F (36.5 C)  TempSrc: Oral Oral Oral Oral  Resp: 16 14 14 16   Height:      Weight:      SpO2: 93% 92% 92% 90%   Weight change:   Intake/Output Summary (Last 24 hours) at 05/26/14 0936 Last data filed at 05/26/14 7939  Gross per 24 hour  Intake    240 ml  Output      0 ml  Net    240 ml   BP 122/63  Pulse 63  Temp(Src) 97.7 F (36.5 C) (Oral)  Resp 16  Ht 5' (1.524 m)  Wt 201 lb (91.173 kg)  BMI 39.26 kg/m2  SpO2 90%  General Appearance:    Alert, cooperative, no distress, appears stated age  Head:    Normocephalic, without obvious abnormality, atraumatic, PERRL, EOMI, MMM  Neck:   No carotid bruit or JVD  Back:     Symmetric, no curvature  Lungs:     Clear to auscultation bilaterally, respirations unlabored  Chest Wall:    No tenderness or deformity   Heart:    Regular rate and rhythm, S1 and S2 normal, no murmur, rub   or gallop  Abdomen:     Soft, non-tender, bowel sounds active all four quadrants,    no masses, no organomegaly  Extremities:   Extremities normal, atraumatic, no cyanosis or edema  Pulses:   2+ and symmetric radial and DP pulses  Neurologic:   CNII-XII intact, decreased grip on L compared to R hand, otherwise L and R extremity strength symmetric, no Babinkski   Lab Results: Basic Metabolic Panel:  Recent Labs Lab 05/25/14 1204 05/25/14 1216 05/26/14 0011  NA 141 140 139  K 4.1 3.9 4.6  CL 102 103 102  CO2 26  --  28  GLUCOSE 89 91 128*  BUN 25* 26* 26*  CREATININE 0.89 1.10 1.07  CALCIUM 9.9  --  10.0   Liver Function  Tests:  Recent Labs Lab 05/25/14 1204  AST 26  ALT 9  ALKPHOS 40  BILITOT 0.3  PROT 7.3  ALBUMIN 4.0   No results found for this basename: LIPASE, AMYLASE,  in the last 168 hours No results found for this basename: AMMONIA,  in the last 168 hours CBC:  Recent Labs Lab 05/25/14 1204 05/25/14 1216  WBC 9.5  --   NEUTROABS 5.3  --   HGB 12.7 13.6  HCT 39.5 40.0  MCV 93.2  --   PLT 354  --    Cardiac Enzymes:  Recent Labs Lab 05/25/14 1828 05/26/14 0011 05/26/14 0638  TROPONINI <0.30 <0.30 <0.30   BNP: No results found for this basename: PROBNP,  in the last 168 hours D-Dimer: No results found for this basename: DDIMER,  in the last 168 hours CBG: No results found for this basename: GLUCAP,  in the last 168 hours Hemoglobin A1C: No results found for this basename: HGBA1C,  in the last 168 hours Fasting Lipid Panel:  Recent Labs Lab 05/26/14 0011  CHOL 176  HDL 33*  LDLCALC 99  TRIG 220*  CHOLHDL 5.3   Thyroid Function Tests: No results found for this basename: TSH, T4TOTAL, FREET4, T3FREE, THYROIDAB,  in the last 168 hours Coagulation:  Recent Labs Lab 05/25/14 1204  LABPROT 13.4  INR 1.02   Anemia Panel: No results found for this basename: VITAMINB12, FOLATE, FERRITIN, TIBC, IRON, RETICCTPCT,  in the last 168 hours Urine Drug Screen: Drugs of Abuse     Component Value Date/Time   LABOPIA NONE DETECTED 05/25/2014 1300   COCAINSCRNUR NONE DETECTED 05/25/2014 1300   LABBENZ POSITIVE* 05/25/2014 1300   AMPHETMU NONE DETECTED 05/25/2014 1300   THCU NONE DETECTED 05/25/2014 1300   LABBARB NONE DETECTED 05/25/2014 1300    Alcohol Level:  Recent Labs Lab 05/25/14 1204  ETH <11   Urinalysis:  Recent Labs Lab 05/25/14 1300  COLORURINE YELLOW  LABSPEC 1.020  PHURINE 6.0  GLUCOSEU NEGATIVE  HGBUR MODERATE*  BILIRUBINUR NEGATIVE  KETONESUR NEGATIVE  PROTEINUR NEGATIVE  UROBILINOGEN 0.2  NITRITE NEGATIVE  LEUKOCYTESUR MODERATE*   Micro  Results: No results found for this or any previous visit (from the past 240 hour(s)). Studies/Results: Ct Head Wo Contrast  05/25/2014   CLINICAL DATA:  Code stroke, left-sided paralysis  EXAM: CT HEAD WITHOUT CONTRAST  TECHNIQUE: Contiguous axial images were obtained from the base of the skull through the vertex without intravenous contrast.  COMPARISON:  MRI brain 09/14/2013  FINDINGS: There is no evidence of mass effect, midline shift, or extra-axial fluid collections. There is no evidence of a space-occupying lesion or intracranial hemorrhage. There is no evidence of a cortical-based area of acute infarction. There is generalized cerebral atrophy. There is periventricular white matter low attenuation likely secondary to microangiopathy.  The ventricles and sulci are appropriate for the patient's age. The basal cisterns are patent.  Visualized portions of the orbits are unremarkable. The visualized portions of the paranasal sinuses and mastoid air cells are unremarkable. Cerebrovascular atherosclerotic calcifications are noted.  The osseous structures are unremarkable.  IMPRESSION: No acute intracranial pathology.   Electronically Signed   By: Kathreen Devoid   On: 05/25/2014 12:20   Mr Brain Wo Contrast  05/25/2014   CLINICAL DATA:  Slurred speech. Left-sided hemiplegia. Evaluate for stroke.  EXAM: MRI HEAD WITHOUT CONTRAST  MRA HEAD WITHOUT CONTRAST  TECHNIQUE: Multiplanar, multiecho pulse sequences of the brain and surrounding structures were obtained without intravenous contrast. Angiographic images of the head were obtained using MRA technique without contrast.  COMPARISON:  Prior CT from earlier the same day as well as prior MRI from 09/14/2013  FINDINGS: MRI HEAD FINDINGS  Diffuse prominence of the CSF containing spaces is compatible with generalized cerebral atrophy. Extensive scattered and confluent T2/FLAIR hyperintensity within the periventricular and deep white matter both cerebral hemispheres is  most consistent with chronic small vessel ischemic changes. Similar changes are seen within the pons.  No mass lesion, midline shift, or extra-axial fluid collection. Ventricles are normal in size without evidence of hydrocephalus.  No diffusion-weighted signal abnormality is identified to suggest acute intracranial infarct. Gray-white matter differentiation is maintained. Normal flow voids are seen within the intracranial vasculature. No intracranial hemorrhage identified.  The cervicomedullary junction is normal. Grade 1 anterolisthesis of C3 on C4 and C4 on C5 with associated degenerative changes noted within the partially visualized upper cervical spine.  Pituitary gland is within normal limits. Pituitary stalk is midline. The globes and optic nerves demonstrate a normal appearance with normal signal intensity. The  The  bone marrow signal intensity is normal. Calvarium is intact. Visualized upper cervical spine is within normal limits.  Scalp soft tissues are unremarkable.  Paranasal sinuses are clear.  No mastoid effusion.  MRA HEAD FINDINGS  The visualized portions of the distal cervical segments of the internal carotid arteries are widely patent with antegrade flow. Mild multi focal atherosclerotic irregularity present within the cavernous segments of the internal carotid arteries bilaterally without hemodynamically significant stenosis. Supra clinoid segments are widely patent and normal in appearance. The A1 segments are symmetric bilaterally. Anterior communicating artery is normal. Anterior cerebral arteries are well opacified.  M1 segments are with widely patent bilaterally without proximal branch occlusion. Short-segment stenosis of approximately 30% present within the mid left M1 segment (series 503, image 10). MCA branches are well opacified distally. No proximal branch occlusion or aneurysm within the anterior circulation.  The right vertebral artery is dominant. The right posterior inferior cerebral  artery is patent. The left posterior inferior cerebral artery is not well visualized. Vertebrobasilar junction and basilar artery are within normal limits. No basilar tip stenosis or aneurysm. Superior cerebellar arteries are symmetric bilaterally and well opacified. Fetal origin of the right posterior cerebral artery noted. There is a short-segment stenosis of at approximately 70% within the distal right P2 segment (series 506, image 12). Left P1 and P2 segments are well opacified. No occlusive thrombus or hemodynamically significant stenosis seen within the posterior circulation.  IMPRESSION: MRI HEAD IMPRESSION:  1. No acute intracranial infarct or other abnormality identified. 2. Generalized age-related atrophy with advanced chronic microvascular ischemic changes involving the supratentorial white matter and pons.  MRA HEAD IMPRESSION:  1. No proximal branch occlusion identified within the intracranial circulation. 2. Short segment stenosis of approximately 30% within the mid -distal left M1 segment. 3. Short segment stenosis of approximately 70% within the distal right P2 segment. No other hemodynamically significant stenosis identified within the intracranial circulation. 4. Fetal origin of the right PCA.   Electronically Signed   By: Jeannine Boga M.D.   On: 05/25/2014 22:15   Mr [name] Head/brain Wo Cm  05/25/2014   CLINICAL DATA:  Slurred speech. Left-sided hemiplegia. Evaluate for stroke.  EXAM: MRI HEAD WITHOUT CONTRAST  MRA HEAD WITHOUT CONTRAST  TECHNIQUE: Multiplanar, multiecho pulse sequences of the brain and surrounding structures were obtained without intravenous contrast. Angiographic images of the head were obtained using MRA technique without contrast.  COMPARISON:  Prior CT from earlier the same day as well as prior MRI from 09/14/2013  FINDINGS: MRI HEAD FINDINGS  Diffuse prominence of the CSF containing spaces is compatible with generalized cerebral atrophy. Extensive scattered and  confluent T2/FLAIR hyperintensity within the periventricular and deep white matter both cerebral hemispheres is most consistent with chronic small vessel ischemic changes. Similar changes are seen within the pons.  No mass lesion, midline shift, or extra-axial fluid collection. Ventricles are normal in size without evidence of hydrocephalus.  No diffusion-weighted signal abnormality is identified to suggest acute intracranial infarct. Gray-white matter differentiation is maintained. Normal flow voids are seen within the intracranial vasculature. No intracranial hemorrhage identified.  The cervicomedullary junction is normal. Grade 1 anterolisthesis of C3 on C4 and C4 on C5 with associated degenerative changes noted within the partially visualized upper cervical spine.  Pituitary gland is within normal limits. Pituitary stalk is midline. The globes and optic nerves demonstrate a normal appearance with normal signal intensity. The  The bone marrow signal intensity is normal. Calvarium is intact. Visualized upper cervical spine  is within normal limits.  Scalp soft tissues are unremarkable.  Paranasal sinuses are clear.  No mastoid effusion.  MRA HEAD FINDINGS  The visualized portions of the distal cervical segments of the internal carotid arteries are widely patent with antegrade flow. Mild multi focal atherosclerotic irregularity present within the cavernous segments of the internal carotid arteries bilaterally without hemodynamically significant stenosis. Supra clinoid segments are widely patent and normal in appearance. The A1 segments are symmetric bilaterally. Anterior communicating artery is normal. Anterior cerebral arteries are well opacified.  M1 segments are with widely patent bilaterally without proximal branch occlusion. Short-segment stenosis of approximately 30% present within the mid left M1 segment (series 503, image 10). MCA branches are well opacified distally. No proximal branch occlusion or aneurysm  within the anterior circulation.  The right vertebral artery is dominant. The right posterior inferior cerebral artery is patent. The left posterior inferior cerebral artery is not well visualized. Vertebrobasilar junction and basilar artery are within normal limits. No basilar tip stenosis or aneurysm. Superior cerebellar arteries are symmetric bilaterally and well opacified. Fetal origin of the right posterior cerebral artery noted. There is a short-segment stenosis of at approximately 70% within the distal right P2 segment (series 506, image 12). Left P1 and P2 segments are well opacified. No occlusive thrombus or hemodynamically significant stenosis seen within the posterior circulation.  IMPRESSION: MRI HEAD IMPRESSION:  1. No acute intracranial infarct or other abnormality identified. 2. Generalized age-related atrophy with advanced chronic microvascular ischemic changes involving the supratentorial white matter and pons.  MRA HEAD IMPRESSION:  1. No proximal branch occlusion identified within the intracranial circulation. 2. Short segment stenosis of approximately 30% within the mid -distal left M1 segment. 3. Short segment stenosis of approximately 70% within the distal right P2 segment. No other hemodynamically significant stenosis identified within the intracranial circulation. 4. Fetal origin of the right PCA.   Electronically Signed   By: Jeannine Boga M.D.   On: 05/25/2014 22:15   Medications: I have reviewed the patient's current medications. Scheduled Meds: . ALPRAZolam  0.5 mg Oral TID  . aspirin EC  325 mg Oral Daily  . atorvastatin  80 mg Oral q1800  . carbidopa-levodopa  1 tablet Oral TID AC  . citalopram  30 mg Oral QHS  . clopidogrel  75 mg Oral QHS  . gemfibrozil  600 mg Oral BID AC  . heparin  5,000 Units Subcutaneous 3 times per day  . pantoprazole  40 mg Oral Daily   Continuous Infusions:  PRN Meds:.senna-docusate Assessment/Plan: Principal Problem:   TIA (transient  ischemic attack) Active Problems:   Dyslipidemia   Parkinson disease   H/O: CVA (cerebrovascular accident)  1. TIA (transient ischemic attack): Pt with h/o TIAs and CVA '03 with residual left sided weakness and decreased sensation. She has had several TIA's in the past. Her acute onset weakness has resolved and her exam is only notable at this time for decreased L hand grip strength. Her MRI/MRA was notable for no acute intracranial infarct or other abnormality; short segment stenosis of approx 30% within mid distal L M1 segment, short segment stenosis of approx 70% within distal R P2 segment. She is currently taking ASA and Plavix due to her previous CVA history. Lipid panel notable for hypertriglyceridemia. - 2D ECHO  - Carotid dopplers  - f/u HgbA1c - Continue ASA, Plavix , atorvastatin - PT/OT/SLP  - Neuro checks q2h x12h, then q4h  -heart healthy diet  -telemetry  2. Parkinson disease:  Stable. She is followed by Dr Jannifer Franklin from neurology. Last seen on 05/14/14 with normal neuro exam but notes difficulty rising from seated position. She also reported episodes of presyncope that have gotten more frequent over time. I appreciated some mild cogwheel rigidity on admission exam. Pt on carbidopa-levodopa at home, which was continued on admission.  -cont sinemet 25-250 po TIDAC   3. Dyslipidemia: Current lipid panel notable for triglycerides 220.  She is not on a statin at home.  -increase atorvastatin to 80 mg po daily  -start gemfibrozil 600 mg po BIDAC  4. Depression/Anxiety: No current complaints. Pt on Celexa and Xanax at home -cont celexa and xanax   #GERD: Stable. Pt on Protonix at home which was held on admission while NPO. -protonix 40 mg po daily  Dispo: Disposition is deferred at this time, awaiting improvement of current medical problems.  Anticipated discharge in approximately 0-1 day(s).   The patient does not know have a current PCP (Dr Jannifer Franklin neurology) (No primary provider  on file.) and does need an [facility] hospital follow-up appointment after discharge.  The patient does not know have transportation limitations that hinder transportation to clinic appointments.  .Services Needed at time of discharge: Y = Yes, Blank = No PT:   OT:   RN:   Equipment:   Other:     LOS: 1 day   Kelby Aline, MD 05/26/2014, 9:36 AM

## 2014-05-27 LAB — CBC
HEMATOCRIT: 37.7 % (ref 36.0–46.0)
Hemoglobin: 12.1 g/dL (ref 12.0–15.0)
MCH: 29.8 pg (ref 26.0–34.0)
MCHC: 32.1 g/dL (ref 30.0–36.0)
MCV: 92.9 fL (ref 78.0–100.0)
PLATELETS: 349 10*3/uL (ref 150–400)
RBC: 4.06 MIL/uL (ref 3.87–5.11)
RDW: 13.5 % (ref 11.5–15.5)
WBC: 6.8 10*3/uL (ref 4.0–10.5)

## 2014-05-27 LAB — BASIC METABOLIC PANEL
ANION GAP: 12 (ref 5–15)
BUN: 24 mg/dL — AB (ref 6–23)
CHLORIDE: 104 meq/L (ref 96–112)
CO2: 28 meq/L (ref 19–32)
Calcium: 9.7 mg/dL (ref 8.4–10.5)
Creatinine, Ser: 1.09 mg/dL (ref 0.50–1.10)
GFR calc non Af Amer: 46 mL/min — ABNORMAL LOW (ref >90)
GFR, EST AFRICAN AMERICAN: 53 mL/min — AB (ref >90)
Glucose, Bld: 104 mg/dL — ABNORMAL HIGH (ref 70–99)
Potassium: 4.4 mEq/L (ref 3.7–5.3)
Sodium: 144 mEq/L (ref 137–147)

## 2014-05-27 MED ORDER — ATORVASTATIN CALCIUM 40 MG PO TABS
40.0000 mg | ORAL_TABLET | Freq: Every day | ORAL | Status: DC
  Administered 2014-05-27: 40 mg via ORAL
  Filled 2014-05-27: qty 1

## 2014-05-27 MED ORDER — POLYETHYLENE GLYCOL 3350 17 G PO PACK
17.0000 g | PACK | Freq: Every day | ORAL | Status: DC
  Administered 2014-05-27 – 2014-05-28 (×2): 17 g via ORAL
  Filled 2014-05-27 (×2): qty 1

## 2014-05-27 NOTE — Progress Notes (Signed)
Subjective: NAEON. Pt very much wanting to go home today. Having no new neurological symptoms including new weakness, dysarthria, dysphagia, HA, or blurry vision.   Objective: Vital signs in last 24 hours: Filed Vitals:   05/26/14 1733 05/26/14 2134 05/27/14 0215 05/27/14 0623  BP: 117/50 133/57 118/52 144/58  Pulse: 76 73 67 68  Temp: 99.1 F (37.3 C) 97.9 F (36.6 C) 98.1 F (36.7 C) 97.7 F (36.5 C)  TempSrc: Oral Oral Oral Oral  Resp: 18 18 18 18   Height:      Weight:      SpO2: 94% 97% 93% 95%   Weight change:   Intake/Output Summary (Last 24 hours) at 05/27/14 0715 Last data filed at 05/26/14 1841  Gross per 24 hour  Intake    960 ml  Output      0 ml  Net    960 ml   BP 144/58  Pulse 68  Temp(Src) 97.7 F (36.5 C) (Oral)  Resp 18  Ht 5' (1.524 m)  Wt 201 lb (91.173 kg)  BMI 39.26 kg/m2  SpO2 95%  General Appearance:    Alert, cooperative, no distress, appears stated age  Head:    Normocephalic, without obvious abnormality, atraumatic, PERRL, EOMI, MMM  Neck:   No carotid bruit or JVD  Back:     Symmetric, no curvature  Lungs:     Clear to auscultation bilaterally, no crackles/wheezes/rhonchi, respirations unlabored  Chest Wall:    No tenderness or deformity   Heart:    Regular rate and rhythm, S1 and S2 normal, no murmur, rub   or gallop  Abdomen:     Soft, non-tender, bowel sounds active all four quadrants,    no masses, no organomegaly  Extremities:   Extremities normal, atraumatic, no cyanosis or edema  Pulses:   2+ and symmetric radial and DP pulses  Neurologic:   CNII-XII intact, decreased grip on L compared to R hand, otherwise L and R extremity strength symmetric, no Babinkski   Lab Results: Basic Metabolic Panel:  Recent Labs Lab 05/26/14 0011 05/27/14 0605  NA 139 144  K 4.6 4.4  CL 102 104  CO2 28 28  GLUCOSE 128* 104*  BUN 26* 24*  CREATININE 1.07 1.09  CALCIUM 10.0 9.7   Liver Function Tests:  Recent Labs Lab 05/25/14 1204   AST 26  ALT 9  ALKPHOS 40  BILITOT 0.3  PROT 7.3  ALBUMIN 4.0   CBC:  Recent Labs Lab 05/25/14 1204 05/25/14 1216 05/27/14 0605  WBC 9.5  --  6.8  NEUTROABS 5.3  --   --   HGB 12.7 13.6 12.1  HCT 39.5 40.0 37.7  MCV 93.2  --  92.9  PLT 354  --  349   Cardiac Enzymes:  Recent Labs Lab 05/25/14 1828 05/26/14 0011 05/26/14 0638  TROPONINI <0.30 <0.30 <0.30   Hemoglobin A1C:  Recent Labs Lab 05/26/14 0011  HGBA1C 6.2*   Fasting Lipid Panel:  Recent Labs Lab 05/26/14 0011  CHOL 176  HDL 33*  LDLCALC 99  TRIG 220*  CHOLHDL 5.3   Thyroid Function Tests:  Recent Labs Lab 05/26/14 0800  TSH 2.770  FREET4 1.16   Coagulation:  Recent Labs Lab 05/25/14 1204  LABPROT 13.4  INR 1.02   Anemia Panel:  Recent Labs Lab 05/26/14 0800  VITAMINB12 813   Urine Drug Screen: Drugs of Abuse     Component Value Date/Time   LABOPIA NONE DETECTED 05/25/2014 1300  COCAINSCRNUR NONE DETECTED 05/25/2014 1300   LABBENZ POSITIVE* 05/25/2014 1300   AMPHETMU NONE DETECTED 05/25/2014 1300   THCU NONE DETECTED 05/25/2014 1300   LABBARB NONE DETECTED 05/25/2014 1300    Alcohol Level:  Recent Labs Lab 05/25/14 1204  ETH <11   Urinalysis:  Recent Labs Lab 05/25/14 1300  COLORURINE YELLOW  LABSPEC 1.020  PHURINE 6.0  GLUCOSEU NEGATIVE  HGBUR MODERATE*  BILIRUBINUR NEGATIVE  KETONESUR NEGATIVE  PROTEINUR NEGATIVE  UROBILINOGEN 0.2  NITRITE NEGATIVE  LEUKOCYTESUR MODERATE*   Studies/Results: Dg Chest 2 View  05/26/2014   CLINICAL DATA:  Stroke.  EXAM: CHEST  2 VIEW  COMPARISON:  Chest x-ray 01/14/2010.  FINDINGS: Lung volumes are normal. No consolidative airspace disease. No pleural effusions. No pneumothorax. No pulmonary nodule or mass noted. Pulmonary vasculature and the cardiomediastinal silhouette are within normal limits. Atherosclerosis in the thoracic aorta.  IMPRESSION: 1.  No radiographic evidence of acute cardiopulmonary disease. 2.  Atherosclerosis.   Electronically Signed   By: Vinnie Langton M.D.   On: 05/26/2014 09:39   Ct Head Wo Contrast  05/25/2014   CLINICAL DATA:  Code stroke, left-sided paralysis  EXAM: CT HEAD WITHOUT CONTRAST  TECHNIQUE: Contiguous axial images were obtained from the base of the skull through the vertex without intravenous contrast.  COMPARISON:  MRI brain 09/14/2013  FINDINGS: There is no evidence of mass effect, midline shift, or extra-axial fluid collections. There is no evidence of a space-occupying lesion or intracranial hemorrhage. There is no evidence of a cortical-based area of acute infarction. There is generalized cerebral atrophy. There is periventricular white matter low attenuation likely secondary to microangiopathy.  The ventricles and sulci are appropriate for the patient's age. The basal cisterns are patent.  Visualized portions of the orbits are unremarkable. The visualized portions of the paranasal sinuses and mastoid air cells are unremarkable. Cerebrovascular atherosclerotic calcifications are noted.  The osseous structures are unremarkable.  IMPRESSION: No acute intracranial pathology.   Electronically Signed   By: Kathreen Devoid   On: 05/25/2014 12:20   Mr Brain Wo Contrast  05/25/2014   CLINICAL DATA:  Slurred speech. Left-sided hemiplegia. Evaluate for stroke.  EXAM: MRI HEAD WITHOUT CONTRAST  MRA HEAD WITHOUT CONTRAST  TECHNIQUE: Multiplanar, multiecho pulse sequences of the brain and surrounding structures were obtained without intravenous contrast. Angiographic images of the head were obtained using MRA technique without contrast.  COMPARISON:  Prior CT from earlier the same day as well as prior MRI from 09/14/2013  FINDINGS: MRI HEAD FINDINGS  Diffuse prominence of the CSF containing spaces is compatible with generalized cerebral atrophy. Extensive scattered and confluent T2/FLAIR hyperintensity within the periventricular and deep white matter both cerebral hemispheres is most  consistent with chronic small vessel ischemic changes. Similar changes are seen within the pons.  No mass lesion, midline shift, or extra-axial fluid collection. Ventricles are normal in size without evidence of hydrocephalus.  No diffusion-weighted signal abnormality is identified to suggest acute intracranial infarct. Gray-white matter differentiation is maintained. Normal flow voids are seen within the intracranial vasculature. No intracranial hemorrhage identified.  The cervicomedullary junction is normal. Grade 1 anterolisthesis of C3 on C4 and C4 on C5 with associated degenerative changes noted within the partially visualized upper cervical spine.  Pituitary gland is within normal limits. Pituitary stalk is midline. The globes and optic nerves demonstrate a normal appearance with normal signal intensity. The  The bone marrow signal intensity is normal. Calvarium is intact. Visualized upper cervical spine is within  normal limits.  Scalp soft tissues are unremarkable.  Paranasal sinuses are clear.  No mastoid effusion.  MRA HEAD FINDINGS  The visualized portions of the distal cervical segments of the internal carotid arteries are widely patent with antegrade flow. Mild multi focal atherosclerotic irregularity present within the cavernous segments of the internal carotid arteries bilaterally without hemodynamically significant stenosis. Supra clinoid segments are widely patent and normal in appearance. The A1 segments are symmetric bilaterally. Anterior communicating artery is normal. Anterior cerebral arteries are well opacified.  M1 segments are with widely patent bilaterally without proximal branch occlusion. Short-segment stenosis of approximately 30% present within the mid left M1 segment (series 503, image 10). MCA branches are well opacified distally. No proximal branch occlusion or aneurysm within the anterior circulation.  The right vertebral artery is dominant. The right posterior inferior cerebral  artery is patent. The left posterior inferior cerebral artery is not well visualized. Vertebrobasilar junction and basilar artery are within normal limits. No basilar tip stenosis or aneurysm. Superior cerebellar arteries are symmetric bilaterally and well opacified. Fetal origin of the right posterior cerebral artery noted. There is a short-segment stenosis of at approximately 70% within the distal right P2 segment (series 506, image 12). Left P1 and P2 segments are well opacified. No occlusive thrombus or hemodynamically significant stenosis seen within the posterior circulation.  IMPRESSION: MRI HEAD IMPRESSION:  1. No acute intracranial infarct or other abnormality identified. 2. Generalized age-related atrophy with advanced chronic microvascular ischemic changes involving the supratentorial white matter and pons.  MRA HEAD IMPRESSION:  1. No proximal branch occlusion identified within the intracranial circulation. 2. Short segment stenosis of approximately 30% within the mid -distal left M1 segment. 3. Short segment stenosis of approximately 70% within the distal right P2 segment. No other hemodynamically significant stenosis identified within the intracranial circulation. 4. Fetal origin of the right PCA.   Electronically Signed   By: Jeannine Boga M.D.   On: 05/25/2014 22:15   Mr [name] Head/brain Wo Cm  05/25/2014   CLINICAL DATA:  Slurred speech. Left-sided hemiplegia. Evaluate for stroke.  EXAM: MRI HEAD WITHOUT CONTRAST  MRA HEAD WITHOUT CONTRAST  TECHNIQUE: Multiplanar, multiecho pulse sequences of the brain and surrounding structures were obtained without intravenous contrast. Angiographic images of the head were obtained using MRA technique without contrast.  COMPARISON:  Prior CT from earlier the same day as well as prior MRI from 09/14/2013  FINDINGS: MRI HEAD FINDINGS  Diffuse prominence of the CSF containing spaces is compatible with generalized cerebral atrophy. Extensive scattered and  confluent T2/FLAIR hyperintensity within the periventricular and deep white matter both cerebral hemispheres is most consistent with chronic small vessel ischemic changes. Similar changes are seen within the pons.  No mass lesion, midline shift, or extra-axial fluid collection. Ventricles are normal in size without evidence of hydrocephalus.  No diffusion-weighted signal abnormality is identified to suggest acute intracranial infarct. Gray-white matter differentiation is maintained. Normal flow voids are seen within the intracranial vasculature. No intracranial hemorrhage identified.  The cervicomedullary junction is normal. Grade 1 anterolisthesis of C3 on C4 and C4 on C5 with associated degenerative changes noted within the partially visualized upper cervical spine.  Pituitary gland is within normal limits. Pituitary stalk is midline. The globes and optic nerves demonstrate a normal appearance with normal signal intensity. The  The bone marrow signal intensity is normal. Calvarium is intact. Visualized upper cervical spine is within normal limits.  Scalp soft tissues are unremarkable.  Paranasal sinuses are clear.  No mastoid effusion.  MRA HEAD FINDINGS  The visualized portions of the distal cervical segments of the internal carotid arteries are widely patent with antegrade flow. Mild multi focal atherosclerotic irregularity present within the cavernous segments of the internal carotid arteries bilaterally without hemodynamically significant stenosis. Supra clinoid segments are widely patent and normal in appearance. The A1 segments are symmetric bilaterally. Anterior communicating artery is normal. Anterior cerebral arteries are well opacified.  M1 segments are with widely patent bilaterally without proximal branch occlusion. Short-segment stenosis of approximately 30% present within the mid left M1 segment (series 503, image 10). MCA branches are well opacified distally. No proximal branch occlusion or aneurysm  within the anterior circulation.  The right vertebral artery is dominant. The right posterior inferior cerebral artery is patent. The left posterior inferior cerebral artery is not well visualized. Vertebrobasilar junction and basilar artery are within normal limits. No basilar tip stenosis or aneurysm. Superior cerebellar arteries are symmetric bilaterally and well opacified. Fetal origin of the right posterior cerebral artery noted. There is a short-segment stenosis of at approximately 70% within the distal right P2 segment (series 506, image 12). Left P1 and P2 segments are well opacified. No occlusive thrombus or hemodynamically significant stenosis seen within the posterior circulation.  IMPRESSION: MRI HEAD IMPRESSION:  1. No acute intracranial infarct or other abnormality identified. 2. Generalized age-related atrophy with advanced chronic microvascular ischemic changes involving the supratentorial white matter and pons.  MRA HEAD IMPRESSION:  1. No proximal branch occlusion identified within the intracranial circulation. 2. Short segment stenosis of approximately 30% within the mid -distal left M1 segment. 3. Short segment stenosis of approximately 70% within the distal right P2 segment. No other hemodynamically significant stenosis identified within the intracranial circulation. 4. Fetal origin of the right PCA.   Electronically Signed   By: Jeannine Boga M.D.   On: 05/25/2014 22:15   Medications: I have reviewed the patient's current medications. Scheduled Meds: . ALPRAZolam  0.5 mg Oral TID  . aspirin EC  325 mg Oral Daily  . atorvastatin  80 mg Oral q1800  . carbidopa-levodopa  1 tablet Oral TID AC  . citalopram  30 mg Oral QHS  . clopidogrel  75 mg Oral QHS  . fenofibrate  160 mg Oral Daily  . heparin  5,000 Units Subcutaneous 3 times per day  . latanoprost  1 drop Both Eyes QHS  . 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 She has had several TIA's in the past. Her acute onset weakness has resolved and her exam is only notable at this time for decreased L hand grip strength. Her MRI/MRA was notable for no acute intracranial infarct or other abnormality; short segment stenosis of approx 30% within mid distal L M1 segment, short segment stenosis of approx 70% within distal R P2 segment. She is currently taking ASA and Plavix due to her previous CVA history. . Risk stratification included: 2D ECHO showed normal EF 55-60% and no WMA or PFO, Lipid panel notable for hypertriglyceridemia - Carotid dopplers still pending - Continue ASA, Plavix , atorvastatin -heart healthy diet  -telemetry -neurology following greatly appreciate recs (anticipate loop recorder placement at some point)  # Parkinson disease: Stable. She is followed by Dr Jannifer Franklin from neurology. Last seen on 05/14/14 with normal neuro exam but notes difficulty rising from seated position. She  also reported episodes of presyncope that have gotten more frequent over time. I appreciated some mild cogwheel rigidity on admission exam. Pt on carbidopa-levodopa at home, which was continued on admission.  -cont sinemet 25-250 po TIDAC   # Dyslipidemia: Current lipid panel notable for triglycerides 220.  She is not on a statin at home.  -increase atorvastatin to 80 mg po daily  -start gemfibrozil 600 mg po BIDAC  # Depression/Anxiety: No current complaints. Pt on Celexa and Xanax at home -cont celexa and xanax   #GERD: Stable. Pt on Protonix at home which was held on admission while NPO. -protonix 40 mg po daily  Dispo: Disposition is deferred at this time, awaiting improvement of current medical problems.  Anticipated discharge in approximately 0-1 day(s).   The patient does not know have a current PCP (Dr Jannifer Franklin neurology) and does  need an [facility] hospital follow-up appointment after discharge.  The patient does not know have transportation limitations that hinder transportation to clinic appointments.  .Services Needed at time of discharge: Y = Yes, Blank = No PT:   OT:   RN:   Equipment:   Other:     LOS: 2 days   Clinton Gallant, MD 05/27/2014, 7:15 AM

## 2014-05-27 NOTE — Progress Notes (Signed)
Utilization review completed.  

## 2014-05-27 NOTE — Progress Notes (Signed)
VASCULAR LAB PRELIMINARY  PRELIMINARY  PRELIMINARY  PRELIMINARY  Carotid Dopplers completed.    Preliminary report:  1-39% ICA stenosis.  Vertebral artery flow is antegrade.   Cleveland Paiz, RVT 05/27/2014, 11:47 AM

## 2014-05-27 NOTE — Progress Notes (Signed)
Stroke Team Progress Note  HISTORY [name] is an 80 y.o. female who lives in assisted living. Patient was on the phone with her son when staff noted she was slurring her words. EMS was called. On scene they noted left sided hemiplegia. Code stroke was activated. On arrival to ED patient initially would not lift arm off the bed but when staff was placing her gown on, she was able to lift the left arm and leg. Speech was muffled but patient would not fully open her mouth to articulate. Initial CT head was negative for acute stroke or bleed. As patient was further examined patient showed effort dependent strength. Patient states her left arm and leg decreased sensation is not new.    Date last known well: Date: 05/25/2014  Time last known well: Time: 11:30  tPA Given: No: resolving and minimal symptoms   SUBJECTIVE No family members present today. The patient voices no complaints. She agrees with loop recorder placement.  OBJECTIVE Most recent Vital Signs: Filed Vitals:   05/27/14 1003 05/27/14 1006 05/27/14 1116 05/27/14 1358  BP: 143/58 132/58 122/72 145/63  Pulse: 80 91 67 69  Temp:    98 F (36.7 C)  TempSrc:    Oral  Resp:      Height:      Weight:      SpO2:    97%   CBG (last 3)  No results found for this basename: GLUCAP,  in the last 72 hours  IV Fluid Intake:     MEDICATIONS  . ALPRAZolam  0.5 mg Oral TID  . aspirin EC  325 mg Oral Daily  . atorvastatin  40 mg Oral q1800  . carbidopa-levodopa  1 tablet Oral TID AC  . citalopram  30 mg Oral QHS  . clopidogrel  75 mg Oral QHS  . fenofibrate  160 mg Oral Daily  . heparin  5,000 Units Subcutaneous 3 times per day  . latanoprost  1 drop Both Eyes QHS  . pantoprazole  40 mg Oral Daily   PRN:  senna-docusate  Diet:  Cardiac thin liquids Activity:  Up with assistance DVT Prophylaxis:  Subcutaneous heparin  CLINICALLY SIGNIFICANT STUDIES Basic Metabolic Panel:   Recent Labs Lab 05/26/14 0011  05/27/14 0605  NA 139 144  K 4.6 4.4  CL 102 104  CO2 28 28  GLUCOSE 128* 104*  BUN 26* 24*  CREATININE 1.07 1.09  CALCIUM 10.0 9.7   Liver Function Tests:   Recent Labs Lab 05/25/14 1204  AST 26  ALT 9  ALKPHOS 40  BILITOT 0.3  PROT 7.3  ALBUMIN 4.0   CBC:   Recent Labs Lab 05/25/14 1204 05/25/14 1216 05/27/14 0605  WBC 9.5  --  6.8  NEUTROABS 5.3  --   --   HGB 12.7 13.6 12.1  HCT 39.5 40.0 37.7  MCV 93.2  --  92.9  PLT 354  --  349   Coagulation:   Recent Labs Lab 05/25/14 1204  LABPROT 13.4  INR 1.02   Cardiac Enzymes:   Recent Labs Lab 05/25/14 1828 05/26/14 0011 05/26/14 0638  TROPONINI <0.30 <0.30 <0.30   Urinalysis:   Recent Labs Lab 05/25/14 1300  COLORURINE YELLOW  LABSPEC 1.020  PHURINE 6.0  GLUCOSEU NEGATIVE  HGBUR MODERATE*  BILIRUBINUR NEGATIVE  KETONESUR NEGATIVE  PROTEINUR NEGATIVE  UROBILINOGEN 0.2  NITRITE NEGATIVE  LEUKOCYTESUR MODERATE*   Lipid Panel    Component Value Date/Time   CHOL 176 05/26/2014 0011  TRIG 220* 05/26/2014 0011   HDL 33* 05/26/2014 0011   CHOLHDL 5.3 05/26/2014 0011   VLDL 44* 05/26/2014 0011   LDLCALC 99 05/26/2014 0011   HgbA1C  Lab Results  Component Value Date   HGBA1C 6.2* 05/26/2014    Urine Drug Screen:     Component Value Date/Time   LABOPIA NONE DETECTED 05/25/2014 1300   COCAINSCRNUR NONE DETECTED 05/25/2014 1300   LABBENZ POSITIVE* 05/25/2014 1300   AMPHETMU NONE DETECTED 05/25/2014 1300   THCU NONE DETECTED 05/25/2014 1300   LABBARB NONE DETECTED 05/25/2014 1300    Alcohol Level:   Recent Labs Lab 05/25/14 [place] <11    Dg Chest 2 View 05/26/2014    1.  No radiographic evidence of acute cardiopulmonary disease.  2. Atherosclerosis.     Ct Head Wo Contrast 05/25/2014    No acute intracranial pathology.     Mr Brain Wo Contrast 05/25/2014    1. No acute intracranial infarct or other abnormality identified.  2. Generalized age-related atrophy with advanced chronic  microvascular ischemic changes involving the supratentorial white matter and pons.    MRA HEAD IMPRESSION:   1. No proximal branch occlusion identified within the intracranial circulation.  2. Short segment stenosis of approximately 30% within the mid -distal left M1 segment.  3. Short segment stenosis of approximately 70% within the distal right P2 segment. No other hemodynamically significant stenosis identified within the intracranial circulation.  4. Fetal origin of the right PCA.    Carotid Doppler - Preliminary report: 1-39% ICA stenosis. Vertebral artery flow is antegrade.   2D Echocardiogram  - Left ventricle: The cavity size was normal. Wall thickness was normal. Systolic function was normal. The estimated ejection fraction was in the range of 55% to 60%. Wall motion was normal; there were no regional wall motion abnormalities. Doppler parameters are consistent with abnormal left ventricular relaxation (grade 1 diastolic dysfunction). - Right ventricle: The cavity size was normal. Wall thickness was mildly increased. - Pulmonary arteries: Systolic pressure was mildly increased.  Impressions:  - Normal LV function; prominent epicardial fat pad, mildly elevated pulmonary pressures.  CXR  1. No radiographic evidence of acute cardiopulmonary disease.  2. Atherosclerosis.  EKG - Sinus rhythm rate 73 beats per minute. For complete results please see formal report.   Therapy Recommendations home PT/OT  Physical Exam     Mental Status:  Alert, oriented, thought content appropriate. Speech without evidence of aphasia. Able to follow 3 step commands without difficulty.  Cranial Nerves:  II: Discs flat bilaterally; Visual fields grossly normal, pupils equal, round, reactive to light and accommodation  III,IV, VI: ptosis not present, extra-ocular motions intact bilaterally  V,VII: mild left nasolabial fold flattening, facial light touch sensation normal bilaterally  VIII: hearing  normal bilaterally  IX,X: gag reflex present  XI: bilateral shoulder shrug  XII: midline tongue extension without atrophy or fasciculations  Motor:  Right : Upper extremity 5/5 Left: Upper extremity 4/5 - drift but not pronator drift, with distraction, no drift, indicating functional component Lower extremity 5/5 Lower extremity 4/5, hoover's sign positive --left arm and leg was effort dependent, indicating functional component Tone and bulk:normal tone throughout; no atrophy noted  Sensory: Pinprick and light touch decreased on the left arm and leg--patient states this is old.  Deep Tendon Reflexes:  Right: Upper Extremity Left: Upper extremity  biceps (C-5 to C-6) 2/4 biceps (C-5 to C-6) 2/4  tricep (C7) 2/4 triceps (C7) 2/4  Brachioradialis (C6) 2/4  Brachioradialis (C6) 2/4  Lower Extremity Lower Extremity  quadriceps (L-2 to L-4) 1/4 quadriceps (L-2 to L-4) 1/4  Achilles (S1) 1/4 Achilles (S1) 1/4  Plantars:  Right: downgoing Left: downgoing  Cerebellar:  normal finger-to-nose, normal heel-to-shin test  Gait: not tested  CV: pulses palpable throughout   ASSESSMENT Ms. [name] is a 80 y.o. female presenting with transient left hemiparesis and dysarthria. T-PA therapy was not initiated as the patient's deficits were resolving. A CT of the head and an MRI were both negative for acute findings. There is functional component on neuro exam, she also has a lot of stress recently. Felt like this episode is more stress related but not able to completely rule out TIA. Will continue stroke work up and stroke risk factor modification as well as stroke prevention.  On aspirin 325 mg orally every day and clopidogrel 75 mg orally every day prior to admission. Now on aspirin 325 mg orally every day and clopidogrel 75 mg orally every day for secondary stroke prevention. Patient with resultant resolution of deficits. Stroke work up underway.    Previous stroke in 2003 with mild residual  left hemiparesis.  History of presyncope and questionable TIAs.  Dyslipidemia - currently on Lipitor 80 mg daily - cholesterol 176; LDL 99  Parkinson's disease followed by Dr. Floyde Parkins  Hypertension history  Hemoglobin A1c 6.2  Recent increased stress  Hospital day # 2  TREATMENT/PLAN  Continue aspirin 325 mg orally every day and clopidogrel 75 mg orally every day for secondary stroke and cardiac prevention.  Pt refused cardionet earlier this month.   Recommend loop recorder implant per cardiology Monday. Pt and teaching service are in agreement.  Orthostatic blood pressures documented - no dramatic changes.  Continue statin for HLD and stroke prevention  Home health PT and OT recommended  Continue to follow up with Dr Jannifer Franklin in [facility] clinic.  Mikey Bussing PA-C [facility] Pager [phone] 05/27/2014, 2:15 PM   SIGNED I, the attending vascular neurologist, have personally obtained a history, examined the patient, evaluated laboratory data, individually viewed imaging studies, and formulated the assessment and plan of care.  I have made any additions or clarifications directly to the above note and agree with the findings and plan as currently documented.   Rosalin Hawking, MD PhD 05/27/2014 10:03 PM  To contact Stroke Continuity provider, please refer to [email or web address] After hours, contact General Neurology

## 2014-05-27 NOTE — Care Management Note (Addendum)
  Page 2 of 2   05/28/2014     3:37:42 PM CARE MANAGEMENT NOTE 05/28/2014  Patient:  [name]   Account Number:  [id number]  Date Initiated:  05/27/2014  Documentation initiated by:  [facility]  Subjective/Objective Assessment:   adm: Acute dysarthria with weakness     Action/Plan:   discharge planning   Anticipated DC Date:  05/27/2014   Anticipated DC Plan:  ASSISTED LIVING / [place]  CM consult      Choice offered to / List presented to:          [facility] arranged  HH-2 PT  HH-3 OT  HH-1 RN  [place]   Status of service:  Completed, signed off Medicare Important Message given?   (If response is "NO", the following Medicare IM given date fields will be blank) Date Medicare IM given:   Medicare IM given by:   Date Additional Medicare IM given:   Additional Medicare IM given by:    Discharge Disposition:  ASSISTED LIVING  Per UR Regulation:  Reviewed for med. necessity/level of care/duration of stay  If discussed at [place] of Stay Meetings, dates discussed:    Comments:  05/28/14 Leslie, MSN, CM- Spoke with [name] with [name] to notify that home health orders have placed and patient will be discharging back to ALF today.   05/27/14 09:00 CM received call from [name] stating pt to Abbott back to ALF  and please arrange PT/OT.  CM texted [name] notification of need for HHPT/OT.  CM called [name] (PT/OT tech) who requested CM fax Orders to [phone].  Request for orders have been made.  CM waiting for orders.  [name], BSN, [place].

## 2014-05-27 NOTE — Progress Notes (Signed)
Internal Medicine Attending  Date: 05/27/2014  Patient name: [name] Medical record number: [id number] Date of birth: [birth year] Age: 80 y.o. Gender: female  I saw and evaluated the patient. I developed the assessment and plan with the housestaff and reviewed the resident's note by [name].  I agree with the resident's findings and plans as documented in her progress note.  [name] was without complaints this morning when I saw her on rounds. She still had a mild decrease in her left hand grip strength but was otherwise unchanged. Neurology would like implantation of a loop recorder which is scheduled for tomorrow. Once this is done she should be ready for discharge back to her assisted living facility. We will continue chronic comorbidity medical management as we have been doing.

## 2014-05-28 ENCOUNTER — Encounter (HOSPITAL_COMMUNITY): Payer: Self-pay | Admitting: *Deleted

## 2014-05-28 ENCOUNTER — Encounter (HOSPITAL_COMMUNITY)
Admission: EM | Disposition: A | Discharge status: Nursing Home (Includes ICF) | Payer: Self-pay | Source: Home / Self Care | Attending: Internal Medicine

## 2014-05-28 DIAGNOSIS — R55 Syncope and collapse: Secondary | ICD-10-CM

## 2014-05-28 DIAGNOSIS — R002 Palpitations: Secondary | ICD-10-CM

## 2014-05-28 HISTORY — PX: TILT TABLE STUDY: SHX6124

## 2014-05-28 HISTORY — PX: LOOP RECORDER IMPLANT: SHX5954

## 2014-05-28 HISTORY — PX: TILT TABLE STUDY: SHX5493

## 2014-05-28 HISTORY — PX: LOOP RECORDER IMPLANT: SHX5477

## 2014-05-28 SURGERY — TILT TABLE STUDY
Anesthesia: LOCAL

## 2014-05-28 MED ORDER — SODIUM CHLORIDE 0.9 % IV SOLN
INTRAVENOUS | Status: DC
  Administered 2014-05-28: 10:00:00 via INTRAVENOUS

## 2014-05-28 MED ORDER — LIDOCAINE-EPINEPHRINE 1 %-1:100000 IJ SOLN
INTRAMUSCULAR | Status: AC
  Filled 2014-05-28: qty 1

## 2014-05-28 MED ORDER — LABETALOL HCL 5 MG/ML IV SOLN
INTRAVENOUS | Status: AC
  Filled 2014-05-28: qty 4

## 2014-05-28 MED ORDER — ATORVASTATIN CALCIUM 40 MG PO TABS: 40.0000 mg | ORAL_TABLET | Freq: Every day | ORAL | Status: DC

## 2014-05-28 NOTE — CV Procedure (Signed)
Pre op Dx  palpiations and presyncope Post op Dx  same  Procedure  Loop Recorder implantation  After routine prep and drape of the left parasternal area, a small incision was created. A [employer]onic LINQ Reveal Loop Recorder  Serial Number  [id number] was inserted.    SteriStrip dressing was  applied.  The patient tolerated the procedure without apparent complication.

## 2014-05-28 NOTE — Progress Notes (Signed)
Stroke Team Progress Note  HISTORY [name] is an 80 y.o. female who lives in assisted living. Patient was on the phone with her son when staff noted she was slurring her words. EMS was called. On scene they noted left sided hemiplegia. Code stroke was activated. On arrival to ED patient initially would not lift arm off the bed but when staff was placing her gown on, she was able to lift the left arm and leg. Speech was muffled but patient would not fully open her mouth to articulate. Initial CT head was negative for acute stroke or bleed. As patient was further examined patient showed effort dependent strength. Patient states her left arm and leg decreased sensation is not new.   Date last known well: Date: 05/25/2014  Time last known well: Time: 11:30  tPA Given: No: resolving and minimal symptoms  SUBJECTIVE Patient just finished working with PT. She feels she did well. She shared that she and her husband live at [facility] in [facility]. Her husband has dementia for the past 5-10 years. They moved there for him/them to get nursing help. They have a condo locally that is being rented. She agrees stress may have played a large part of her current hospitalization. She is currently feeling less stress and is anxious to get back to her husband.   OBJECTIVE Most recent Vital Signs: Filed Vitals:   05/27/14 1658 05/27/14 2217 05/28/14 0044 05/28/14 0515  BP: 150/61 145/66 148/68 129/64  Pulse: 67 75 82 67  Temp: 97.8 F (36.6 C) 97.4 F (36.3 C) 97.7 F (36.5 C) 97.5 F (36.4 C)  TempSrc: Oral Oral Oral Oral  Resp: 18 18 18 18   Height:      Weight:      SpO2: 100% 97% 96% 96%   CBG (last 3)  No results found for this basename: GLUCAP,  in the last 72 hours  IV Fluid Intake:   . sodium chloride 100 mL/hr at 05/28/14 1020    MEDICATIONS  . ALPRAZolam  0.5 mg Oral TID  . aspirin EC  325 mg Oral Daily  . atorvastatin  40 mg Oral q1800  . carbidopa-levodopa  1  tablet Oral TID AC  . citalopram  30 mg Oral QHS  . clopidogrel  75 mg Oral QHS  . fenofibrate  160 mg Oral Daily  . heparin  5,000 Units Subcutaneous 3 times per day  . latanoprost  1 drop Both Eyes QHS  . pantoprazole  40 mg Oral Daily  . polyethylene glycol  17 g Oral Daily   PRN:  senna-docusate  Diet:  NPO  Activity:  Up with assistance DVT Prophylaxis:  Subcutaneous heparin  CLINICALLY SIGNIFICANT STUDIES Basic Metabolic Panel:   Recent Labs Lab 05/26/14 0011 05/27/14 0605  NA 139 144  K 4.6 4.4  CL 102 104  CO2 28 28  GLUCOSE 128* 104*  BUN 26* 24*  CREATININE 1.07 1.09  CALCIUM 10.0 9.7   Liver Function Tests:   Recent Labs Lab 05/25/14 1204  AST 26  ALT 9  ALKPHOS 40  BILITOT 0.3  PROT 7.3  ALBUMIN 4.0   CBC:   Recent Labs Lab 05/25/14 1204 05/25/14 1216 05/27/14 0605  WBC 9.5  --  6.8  NEUTROABS 5.3  --   --   HGB 12.7 13.6 12.1  HCT 39.5 40.0 37.7  MCV 93.2  --  92.9  PLT 354  --  349   Coagulation:  Recent Labs Lab 05/25/14 1204  LABPROT 13.4  INR 1.02   Cardiac Enzymes:   Recent Labs Lab 05/25/14 1828 05/26/14 0011 05/26/14 0638  TROPONINI <0.30 <0.30 <0.30   Urinalysis:   Recent Labs Lab 05/25/14 1300  COLORURINE YELLOW  LABSPEC 1.020  PHURINE 6.0  GLUCOSEU NEGATIVE  HGBUR MODERATE*  BILIRUBINUR NEGATIVE  KETONESUR NEGATIVE  PROTEINUR NEGATIVE  UROBILINOGEN 0.2  NITRITE NEGATIVE  LEUKOCYTESUR MODERATE*   Lipid Panel    Component Value Date/Time   CHOL 176 05/26/2014 0011   TRIG 220* 05/26/2014 0011   HDL 33* 05/26/2014 0011   CHOLHDL 5.3 05/26/2014 0011   VLDL 44* 05/26/2014 0011   LDLCALC 99 05/26/2014 0011   HgbA1C  Lab Results  Component Value Date   HGBA1C 6.2* 05/26/2014    Urine Drug Screen:     Component Value Date/Time   LABOPIA NONE DETECTED 05/25/2014 1300   COCAINSCRNUR NONE DETECTED 05/25/2014 1300   LABBENZ POSITIVE* 05/25/2014 1300   AMPHETMU NONE DETECTED 05/25/2014 1300   THCU NONE  DETECTED 05/25/2014 1300   LABBARB NONE DETECTED 05/25/2014 1300    Alcohol Level:   Recent Labs Lab 05/25/14 [place] <11    Dg Chest 2 View 05/26/2014    1.  No radiographic evidence of acute cardiopulmonary disease.  2. Atherosclerosis.     Ct Head Wo Contrast 05/25/2014    No acute intracranial pathology.     Mr Brain Wo Contrast 05/25/2014    1. No acute intracranial infarct or other abnormality identified.  2. Generalized age-related atrophy with advanced chronic microvascular ischemic changes involving the supratentorial white matter and pons.    MRA HEAD    1. No proximal branch occlusion identified within the intracranial circulation.  2. Short segment stenosis of approximately 30% within the mid -distal left M1 segment.  3. Short segment stenosis of approximately 70% within the distal right P2 segment. No other hemodynamically significant stenosis identified within the intracranial circulation.  4. Fetal origin of the right PCA.    Carotid Doppler - Preliminary report: 1-39% ICA stenosis. Vertebral artery flow is antegrade.   2D Echocardiogram  - Left ventricle: The cavity size was normal. Wall thickness was normal. Systolic function was normal. The estimated ejection fraction was in the range of 55% to 60%. Wall motion was normal; there were no regional wall motion abnormalities. Doppler parameters are consistent with abnormal left ventricular relaxation (grade 1 diastolic dysfunction). - Right ventricle: The cavity size was normal. Wall thickness was mildly increased. - Pulmonary arteries: Systolic pressure was mildly increased. Impressions: - Normal LV function; prominent epicardial fat pad, mildly elevated pulmonary pressures.  CXR  1. No radiographic evidence of acute cardiopulmonary disease. 2. Atherosclerosis.  EKG - Sinus rhythm rate 73 beats per minute. For complete results please see formal report.   Therapy Recommendations home PT/OT  Physical Exam     Mental Status:  Alert, oriented, thought content appropriate. Speech without evidence of aphasia. Able to follow 3 step commands without difficulty.  Cranial Nerves:  II: Can count fingers in each visual field. pupils equal, round III,IV, VI: ptosis not present, extra-ocular motions intact bilaterally  V,VII: mild left nasolabial fold flattening, facial light touch sensation normal bilaterally  VIII: hearing normal bilaterally  XII: midline tongue extension without atrophy or fasciculations  Motor:  Right : Upper extremity 5/5. Lower extremity 5/5  Left: Upper extremity 5/5. Lower extremity 4/5  No upper drift  Tone and bulk:normal tone throughout; no  atrophy noted  Sensory: light touch decreased on the left arm and leg--patient states this is old.  Cerebellar:  normal finger-to-nose CV: pulses palpable throughout   ASSESSMENT Ms. [name] is a 80 y.o. female presenting with transient left hemiparesis and dysarthria. T-PA therapy was not initiated as the patient's deficits were resolving. A CT of the head and an MRI were both negative for acute findings. There is functional component on neuro exam, she also has a lot of stress recently. Feel this episode is more stress related but not able to completely rule out right brain TIA. On aspirin 325 mg orally every day and clopidogrel 75 mg orally every day prior to admission. Now on aspirin 325 mg orally every day and clopidogrel 75 mg orally every day for secondary stroke prevention. Patient with resultant resolution of deficits.    Previous stroke in 2003 with mild residual left hemiparesis.  History of presyncope and questionable TIAs.  Dyslipidemia - currently on Lipitor 80 mg daily - cholesterol 176; LDL 99  Parkinson's disease followed by Dr. Floyde Parkins  Hypertension history  Hemoglobin A1c 6.2  Recent increased stress due to husband's dementia  Dr. Caryl Comes saw this am. Plans tilt table, loop. [place] for discharge  following.  Hospital day # 3  TREATMENT/PLAN  Continue aspirin 325 mg orally every day and clopidogrel 75 mg orally every day for secondary stroke and cardiac prevention.  Patient for tilt table and loop implant per cardiology today.   Continue statin for HLD and stroke prevention  [facility] health PT and OT recommended at [facility] for discharge from neuro standpoint  Follow up with Dr Jannifer Franklin in [facility] clinic in 2 months or as scheduled.  Burnetta Sabin, MSN, RN, ANVP-BC, ANP-BC, [facility] Pager: [phone] 05/28/2014 10:42 AM  SIGNED Antony Contras, MD  To contact Stroke Continuity provider, please refer to [email or web address] After hours, contact General Neurology

## 2014-05-28 NOTE — Progress Notes (Signed)
Utilization review completed.  

## 2014-05-28 NOTE — Progress Notes (Signed)
  PROGRESS NOTE MEDICINE TEACHING ATTENDING   Day 3 of stay Patient name: [name]   Medical record number: [id number] Date of birth: 28-Sep-1930    I met with [name] to evaluate her on the day of possible discharge. She appears to be doing well and has no new complaints. Her vitals are stable. I have reviewed the exam findings of Dr Ethelene Hal in his note from today and I concur with his documentation. The patient has been evaluated by cardiology and neurology both this admission. Appreciate inputs. She will be set up for a loop implant per cardiology. She is working with PT. Agree with overall management.   I have discussed the care of this patient with my IM team residents. Please see the resident note for details.  Madilyn Fireman 05/28/2014, 2:38 PM.

## 2014-05-28 NOTE — CV Procedure (Signed)
[name] [id number]  [id number]  Preop Dx: palpitations and presyncope Postop Dx same/   Procedure:tILT TABLE TEST AND LOOP RECORDER ISNERTION   Cx: None pT WAS TILTED UP FOR 20 MIN BP 160>>220 with prolonged standing  Imprression Orthostatic HYPERTENSION  [name] [id number]  [id number]  .

## 2014-05-28 NOTE — Progress Notes (Signed)
Physical Therapy Treatment Patient Details Name: [name] MRN: [id number] DOB: [birth year] Today's Date: 05/28/2014    History of Present Illness Admitted with dysarthria and worsening L sided weakness.  PMHx-- parkinsons, CVA '03, multiple TIA's    PT Comments    Progressing well.  Noted start of shuffled steps on L with fatigue.  Emphasized transfer safety from chair, toilet and generally better use of the RW.  Follow Up Recommendations  Home health PT     Equipment Recommendations  None recommended by PT    Recommendations for Other Services       Precautions / Restrictions Precautions Precautions: Fall    Mobility  Bed Mobility                  Transfers     Transfers: Sit to/from Stand;Stand Pivot Transfers Sit to Stand: Supervision Stand pivot transfers: Supervision       General transfer comment: Needed to reinforce hand placement and keeping the RW closer by and my rationale.  Pt though is going to do as she wants  Ambulation/Gait Ambulation/Gait assistance: Supervision Ambulation Distance (Feet): 540 Feet Assistive device: Rolling walker (2 wheeled) Gait Pattern/deviations: Step-through pattern Gait velocity: can increase speed to cue, but prefers slower   General Gait Details: slow and steady, but starts shuffling on Left with fatigue after about 300 feet.  Notice a slowing of transition with directional or activity changes/   Stairs            Wheelchair Mobility    Modified Rankin (Stroke Patients Only) Modified Rankin (Stroke Patients Only) Pre-Morbid Rankin Score: No significant disability Modified Rankin: Moderate disability     Balance Overall balance assessment: Needs assistance Sitting-balance support: Feet supported;No upper extremity supported Sitting balance-Leahy Scale: Good     Standing balance support: No upper extremity supported Standing balance-Leahy Scale: Fair                       Cognition Arousal/Alertness: Awake/alert Behavior During Therapy: WFL for tasks assessed/performed Overall Cognitive Status: Within Functional Limits for tasks assessed                      Exercises      General Comments General comments (skin integrity, edema, etc.): Emphasized transfer safety today      Pertinent Vitals/Pain     Home Living                      Prior Function            PT Goals (current goals can now be found in the care plan section) Acute Rehab PT Goals PT Goal Formulation: With patient Time For Goal Achievement: 06/02/14 Potential to Achieve Goals: Good Progress towards PT goals: Progressing toward goals    Frequency  Min 2X/week    PT Plan Current plan remains appropriate    Co-evaluation             End of Session   Activity Tolerance: Patient tolerated treatment well Patient left: in chair;with call bell/phone within reach     Time: 1025-1048 PT Time Calculation (min): 23 min  Charges:  $Gait Training: 8-22 mins $Therapeutic Activity: 8-22 mins                    G Codes:      Khyle Goodell, Eliseo Gum 05/28/2014, 11:03 AM 05/28/2014  [place] Bing, PT [phone]  (  pager)

## 2014-05-28 NOTE — Progress Notes (Signed)
Report given to [place].

## 2014-05-28 NOTE — Progress Notes (Signed)
Subjective: [name]. Pt expressing that she wants to leave to care for her husband with dementia but knows she cant take care of him if she doesn't take care of herself. She denies any new neurological symptoms including new weakness, dysarthria, dysphagia, HA, or blurry vision.   Objective: Vital signs in last 24 hours: Filed Vitals:   05/27/14 1658 05/27/14 2217 05/28/14 0044 05/28/14 0515  BP: 150/61 145/66 148/68 129/64  Pulse: 67 75 82 67  Temp: 97.8 F (36.6 C) 97.4 F (36.3 C) 97.7 F (36.5 C) 97.5 F (36.4 C)  TempSrc: Oral Oral Oral Oral  Resp: 18 18 18 18   Height:      Weight:      SpO2: 100% 97% 96% 96%   Weight change:  No intake or output data in the 24 hours ending 05/28/14 1039 BP 129/64  Pulse 67  Temp(Src) 97.5 F (36.4 C) (Oral)  Resp 18  Ht 5' (1.524 m)  Wt 201 lb (91.173 kg)  BMI 39.26 kg/m2  SpO2 96%  General Appearance:    Alert, cooperative, no distress, appears stated age  Head:    Normocephalic, without obvious abnormality, atraumatic, PERRL, EOMI, MMM  Neck:   No carotid bruit or JVD  Back:     Symmetric, no curvature  Lungs:     Clear to auscultation bilaterally, no crackles/wheezes/rhonchi, respirations unlabored  Chest Wall:    No tenderness or deformity   Heart:    Regular rate and rhythm, S1 and S2 normal, no murmur, rub   or gallop  Abdomen:     Soft, non-tender, bowel sounds active all four quadrants,    no masses, no organomegaly  Extremities:   Extremities normal, atraumatic, no cyanosis or edema  Pulses:   2+ and symmetric radial and DP pulses  Neurologic:   CNII-XII intact, decreased grip on L compared to R hand (stable), otherwise L and R extremity strength symmetric, no Babinkski   Lab Results: Basic Metabolic Panel:  Recent Labs Lab 05/26/14 0011 05/27/14 0605  NA 139 144  K 4.6 4.4  CL 102 104  CO2 28 28  GLUCOSE 128* 104*  BUN 26* 24*  CREATININE 1.07 1.09  CALCIUM 10.0 9.7   Liver Function Tests:  Recent  Labs Lab 05/25/14 1204  AST 26  ALT 9  ALKPHOS 40  BILITOT 0.3  PROT 7.3  ALBUMIN 4.0   CBC:  Recent Labs Lab 05/25/14 1204 05/25/14 1216 05/27/14 0605  WBC 9.5  --  6.8  NEUTROABS 5.3  --   --   HGB 12.7 13.6 12.1  HCT 39.5 40.0 37.7  MCV 93.2  --  92.9  PLT 354  --  349   Cardiac Enzymes:  Recent Labs Lab 05/25/14 1828 05/26/14 0011 05/26/14 0638  TROPONINI <0.30 <0.30 <0.30   Hemoglobin A1C:  Recent Labs Lab 05/26/14 0011  HGBA1C 6.2*   Fasting Lipid Panel:  Recent Labs Lab 05/26/14 0011  CHOL 176  HDL 33*  LDLCALC 99  TRIG 220*  CHOLHDL 5.3   Thyroid Function Tests:  Recent Labs Lab 05/26/14 0800  TSH 2.770  FREET4 1.16   Coagulation:  Recent Labs Lab 05/25/14 1204  LABPROT 13.4  INR 1.02   Anemia Panel:  Recent Labs Lab 05/26/14 0800  VITAMINB12 813   Urine Drug Screen: Drugs of Abuse     Component Value Date/Time   LABOPIA NONE DETECTED 05/25/2014 1300   COCAINSCRNUR NONE DETECTED 05/25/2014 1300   LABBENZ POSITIVE*  05/25/2014 1300   AMPHETMU NONE DETECTED 05/25/2014 1300   THCU NONE DETECTED 05/25/2014 1300   LABBARB NONE DETECTED 05/25/2014 1300    Alcohol Level:  Recent Labs Lab 05/25/14 1204  ETH <11   Urinalysis:  Recent Labs Lab 05/25/14 1300  COLORURINE YELLOW  LABSPEC 1.020  PHURINE 6.0  GLUCOSEU NEGATIVE  HGBUR MODERATE*  BILIRUBINUR NEGATIVE  KETONESUR NEGATIVE  PROTEINUR NEGATIVE  UROBILINOGEN 0.2  NITRITE NEGATIVE  LEUKOCYTESUR MODERATE*   7/25 Echo Study Conclusions  - Left ventricle: The cavity size was normal. Wall thickness was normal. Systolic function was normal. The estimated ejection fraction was in the range of 55% to 60%. Wall motion was normal; there were no regional wall motion abnormalities. Doppler parameters are consistent with abnormal left ventricular relaxation (grade 1 diastolic dysfunction). - Right ventricle: The cavity size was normal. Wall thickness was mildly  increased. - Pulmonary arteries: Systolic pressure was mildly increased.    Studies/Results: No results found. Medications: I have reviewed the patient's current medications. Scheduled Meds: . ALPRAZolam  0.5 mg Oral TID  . aspirin EC  325 mg Oral Daily  . atorvastatin  40 mg Oral q1800  . carbidopa-levodopa  1 tablet Oral TID AC  . citalopram  30 mg Oral QHS  . clopidogrel  75 mg Oral QHS  . fenofibrate  160 mg Oral Daily  . heparin  5,000 Units Subcutaneous 3 times per day  . latanoprost  1 drop Both Eyes QHS  . pantoprazole  40 mg Oral Daily  . polyethylene glycol  17 g Oral Daily   Continuous Infusions: . sodium chloride 100 mL/hr at 05/28/14 1020   PRN Meds:.senna-docusate Assessment/Plan: # TIA: Pt with h/o TIAs and CVA '03 with residual left sided weakness and decreased sensation. She has had several TIA's in the past. Her acute onset weakness has resolved and her exam is only notable at this time for decreased L hand grip strength. Her MRI/MRA was notable for no acute intracranial infarct or other abnormality; short segment stenosis of approx 30% within mid distal L M1 segment, short segment stenosis of approx 70% within distal R P2 segment. She is currently taking ASA and Plavix due to her previous CVA history. . Risk stratification included: 2D ECHO showed normal EF 55-60% and no WMA or PFO, Lipid panel notable for hypertriglyceridemia. Neurology would like loop recorder implanted. This was discussed w patient as outpatient but previously deferred. She has agreed to placement today. -appreciate neurology, EP -tilt testing, loop recorder implantation per EP/neuro -carotid doppler pending -Continue ASA, Plavix , atorvastatin -heart healthy diet  -telemetry  # Parkinson disease: Stable. She is followed by Dr Jannifer Franklin from neurology. Last seen on 05/14/14 with normal neuro exam but notes difficulty rising from seated position. She also reported episodes of presyncope that have  gotten more frequent over time. I appreciated some mild cogwheel rigidity on admission exam. Pt on carbidopa-levodopa at home, which was continued on admission.  -cont sinemet 25-250 po TIDAC   # Dyslipidemia: Current lipid panel notable for triglycerides 220.  She is not on a statin at home.  -increase atorvastatin to 80 mg po daily  -start gemfibrozil 600 mg po BIDAC  # Depression/Anxiety: No current complaints. Pt on Celexa and Xanax at home -cont celexa and xanax   #GERD: Stable. Pt on Protonix at home which was held on admission while NPO. -protonix 40 mg po daily  Dispo: Disposition is deferred at this time, awaiting improvement of current medical  problems.  Anticipated discharge in approximately 0-1 day(s).   The patient does not know have a current PCP (Dr Jannifer Franklin neurology) and does need an [facility] hospital follow-up appointment after discharge.  The patient does not know have transportation limitations that hinder transportation to clinic appointments.  .Services Needed at time of discharge: Y = Yes, Blank = No PT:   OT:   RN:   Equipment:   Other:     LOS: 3 days   Kelby Aline, MD 05/28/2014, 10:39 AM

## 2014-05-28 NOTE — Discharge Summary (Signed)
Name: [name] MRN: [id number] DOB: 1930/03/08 80 y.o. PCP: No primary provider on file.  Date of Admission: 05/25/2014 12:03 PM Date of Discharge: 05/28/2014 Attending Physician: Madilyn Fireman, MD  Discharge Diagnosis:  Principal Problem:   TIA (transient ischemic attack) Active Problems:   Dyslipidemia   Parkinson disease   H/O: CVA (cerebrovascular accident)   Palpitations  Discharge Medications:   Medication List    ASK your doctor about these medications       ALPRAZolam 0.5 MG tablet  Commonly known as:  XANAX  Take 0.5 mg by mouth 3 (three) times daily.     aspirin EC 325 MG tablet  Take 325 mg by mouth daily.     carbidopa-levodopa 25-250 MG per tablet  Commonly known as:  SINEMET IR  Take 1 tablet by mouth 3 (three) times daily before meals. Patient takes at 7am, 12noon, and 5pm     citalopram 20 MG tablet  Commonly known as:  CELEXA  Take 30 mg by mouth at bedtime.     clopidogrel 75 MG tablet  Commonly known as:  PLAVIX  Take 75 mg by mouth at bedtime.     dicyclomine 20 MG tablet  Commonly known as:  BENTYL  Take 20 mg by mouth 4 (four) times daily as needed (for IBS symptoms).     fenofibrate 160 MG tablet  Take 160 mg by mouth daily.     fluticasone 50 MCG/ACT nasal spray  Commonly known as:  FLONASE  Place 2 sprays into the nose daily.     folic acid-vitamin b complex-vitamin c-selenium-zinc 3 MG Tabs tablet  Take 1 tablet by mouth daily.     losartan 25 MG tablet  Commonly known as:  COZAAR  Take 25 mg by mouth daily.     montelukast 10 MG tablet  Commonly known as:  SINGULAIR  Take 10 mg by mouth daily.     pantoprazole 40 MG tablet  Commonly known as:  PROTONIX  Take 40 mg by mouth daily.     travoprost (benzalkonium) 0.004 % ophthalmic solution  Commonly known as:  TRAVATAN  Place 1 drop into both eyes at bedtime.     Vitamin D-3 5000 UNITS Tabs  Take 5,000 Units by mouth daily at 12 noon.     ZEASORB powder    Generic drug:  talc  Apply 1 application topically 2 (two) times daily as needed (for irritation).        Disposition and follow-up:   Ms.[name] was discharged from [facility] in Good condition.  At the hospital follow up visit please address:  1.  Loop recorder, any new neurologic symptoms or deficits  2.  Labs / imaging needed at time of follow-up: none  3.  Pending labs/ test needing follow-up: loop recorder, table tilt results  Follow-up Appointments: Follow-up Information   Follow up with Xu,Jindong, MD. Schedule an appointment as soon as possible for a visit in 2 months.   Specialty:  Neurology   Contact information:   [address] [place] [address] [phone]       Follow up with [facility]. Schedule an appointment as soon as possible for a visit in 1 week.   Specialty:  Family Medicine   Contact information:   [place] [address] [phone]     Cardiology will schedule your wound check and follow-up appointment.   Consultations: Treatment Team:  Md Stroke, MD Deboraha Sprang, MD  Procedures Performed:  Dg Chest 2 View  05/26/2014   CLINICAL DATA:  Stroke.  EXAM: CHEST  2 VIEW  COMPARISON:  Chest x-ray 01/14/2010.  FINDINGS: Lung volumes are normal. No consolidative airspace disease. No pleural effusions. No pneumothorax. No pulmonary nodule or mass noted. Pulmonary vasculature and the cardiomediastinal silhouette are within normal limits. Atherosclerosis in the thoracic aorta.  IMPRESSION: 1.  No radiographic evidence of acute cardiopulmonary disease. 2. Atherosclerosis.   Electronically Signed   By: Vinnie Langton M.D.   On: 05/26/2014 09:39   Ct Head Wo Contrast  05/25/2014   CLINICAL DATA:  Code stroke, left-sided paralysis  EXAM: CT HEAD WITHOUT CONTRAST  TECHNIQUE: Contiguous axial images were obtained from the base of the skull through the vertex  without intravenous contrast.  COMPARISON:  MRI brain 09/14/2013  FINDINGS: There is no evidence of mass effect, midline shift, or extra-axial fluid collections. There is no evidence of a space-occupying lesion or intracranial hemorrhage. There is no evidence of a cortical-based area of acute infarction. There is generalized cerebral atrophy. There is periventricular white matter low attenuation likely secondary to microangiopathy.  The ventricles and sulci are appropriate for the patient's age. The basal cisterns are patent.  Visualized portions of the orbits are unremarkable. The visualized portions of the paranasal sinuses and mastoid air cells are unremarkable. Cerebrovascular atherosclerotic calcifications are noted.  The osseous structures are unremarkable.  IMPRESSION: No acute intracranial pathology.   Electronically Signed   By: Kathreen Devoid   On: 05/25/2014 12:20   Mr [name] Head/brain Wo Cm  05/25/2014   CLINICAL DATA:  Slurred speech. Left-sided hemiplegia. Evaluate for stroke.  EXAM: MRI HEAD WITHOUT CONTRAST  MRA HEAD WITHOUT CONTRAST  TECHNIQUE: Multiplanar, multiecho pulse sequences of the brain and surrounding structures were obtained without intravenous contrast. Angiographic images of the head were obtained using MRA technique without contrast.  COMPARISON:  Prior CT from earlier the same day as well as prior MRI from 09/14/2013  FINDINGS: MRI HEAD FINDINGS  Diffuse prominence of the CSF containing spaces is compatible with generalized cerebral atrophy. Extensive scattered and confluent T2/FLAIR hyperintensity within the periventricular and deep white matter both cerebral hemispheres is most consistent with chronic small vessel ischemic changes. Similar changes are seen within the pons.  No mass lesion, midline shift, or extra-axial fluid collection. Ventricles are normal in size without evidence of hydrocephalus.  No diffusion-weighted signal abnormality is identified to suggest acute  intracranial infarct. Gray-white matter differentiation is maintained. Normal flow voids are seen within the intracranial vasculature. No intracranial hemorrhage identified.  The cervicomedullary junction is normal. Grade 1 anterolisthesis of C3 on C4 and C4 on C5 with associated degenerative changes noted within the partially visualized upper cervical spine.  Pituitary gland is within normal limits. Pituitary stalk is midline. The globes and optic nerves demonstrate a normal appearance with normal signal intensity. The  The bone marrow signal intensity is normal. Calvarium is intact. Visualized upper cervical spine is within normal limits.  Scalp soft tissues are unremarkable.  Paranasal sinuses are clear.  No mastoid effusion.  MRA HEAD FINDINGS  The visualized portions of the distal cervical segments of the internal carotid arteries are widely patent with antegrade flow. Mild multi focal atherosclerotic irregularity present within the cavernous segments of the internal carotid arteries bilaterally without hemodynamically significant stenosis. Supra clinoid segments are widely patent and normal in appearance. The A1 segments are symmetric bilaterally. Anterior communicating  artery is normal. Anterior cerebral arteries are well opacified.  M1 segments are with widely patent bilaterally without proximal branch occlusion. Short-segment stenosis of approximately 30% present within the mid left M1 segment (series 503, image 10). MCA branches are well opacified distally. No proximal branch occlusion or aneurysm within the anterior circulation.  The right vertebral artery is dominant. The right posterior inferior cerebral artery is patent. The left posterior inferior cerebral artery is not well visualized. Vertebrobasilar junction and basilar artery are within normal limits. No basilar tip stenosis or aneurysm. Superior cerebellar arteries are symmetric bilaterally and well opacified. Fetal origin of the right posterior  cerebral artery noted. There is a short-segment stenosis of at approximately 70% within the distal right P2 segment (series 506, image 12). Left P1 and P2 segments are well opacified. No occlusive thrombus or hemodynamically significant stenosis seen within the posterior circulation.  IMPRESSION: MRI HEAD IMPRESSION:  1. No acute intracranial infarct or other abnormality identified. 2. Generalized age-related atrophy with advanced chronic microvascular ischemic changes involving the supratentorial white matter and pons.  MRA HEAD IMPRESSION:  1. No proximal branch occlusion identified within the intracranial circulation. 2. Short segment stenosis of approximately 30% within the mid -distal left M1 segment. 3. Short segment stenosis of approximately 70% within the distal right P2 segment. No other hemodynamically significant stenosis identified within the intracranial circulation. 4. Fetal origin of the right PCA.   Electronically Signed   By: Jeannine Boga M.D.   On: 05/25/2014 22:15    2D Echo 05/26/14: Study Conclusions  - Left ventricle: The cavity size was normal. Wall thickness was normal. Systolic function was normal. The estimated ejection fraction was in the range of 55% to 60%. Wall motion was normal; there were no regional wall motion abnormalities. Doppler parameters are consistent with abnormal left ventricular relaxation (grade 1 diastolic dysfunction). - Right ventricle: The cavity size was normal. Wall thickness was mildly increased. - Pulmonary arteries: Systolic pressure was mildly increased.  Carotid Doppler 05/27/14: Preliminary report: 1-39% ICA stenosis. Vertebral artery flow is antegrade.  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 She was last seen normal around 11:30am. The patient says she remembers being on the phone with her daughter and became confused, thinking it was her sister-in-law. She began to slur her words. EMS was called and on arrival to the scene they noted left sided hemiplegia. A code stroke was called and on arrival to the ED, she was unable to lift her left arm; however she was noted to raise her left arm and leg while undressing in the ED, and her strength seemed dependent on effort. He speech at that time was noted to be muffled but the patient was not opening her mouth fully to speak. A CT head was performed and was negative. Per report from the ED physician and from Neurology, the patient's symptoms were improving after arrival to the ED. She denies any loss of consciousness, fecal or urinary incontinence, changes in vision or hearing, chest pain, SOB, abdominal or flank pain, nausea, emesis, diarrhea, constipation, fevers, chills, or changes in weight.  Hospital Course by problem list: Principal Problem:   TIA (transient ischemic attack) Active Problems:  Dyslipidemia   Parkinson disease   H/O: CVA (cerebrovascular accident)   Palpitations   # TIA: Mrs. Breault has a history of TIAs and CVA '03 with residual left sided weakness and decreased sensation. She has had several TIA's in the past. Her acute onset weakness resolved in the ED and her exam is only notable for decreased L hand grip strength and mild bilateral cogwheel rigidity. Her CT was negative and MRI/MRA was notable for no acute intracranial infarct or other abnormality; short segment stenosis of approx 30% within mid distal L M1 segment, short segment stenosis of approx 70% within distal R P2 segment.Risk stratification included: 2D ECHO showed normal EF 55-60% and no WMA or PFO, carotid dopplers 1-39% ICA stenosis, vertebral artery flow is antegrade, lipid  panel notable for hypertriglyceridemia, normal vitamin B12, TSH, free T4, HgbA1c 6.2.  She is currently taking ASA and Plavix due to her previous CVA history. She was kept on cardiac monitoring during her stay. She was started on atorvastatin 40 mg po daily and fenofibrate 160 mg po daily. She was seen by neurology who recommended cardiology evaluation for loop recorder implantation. This was discussed with Mrs. Alford Highland as outpatient but previously deferred as it sounded complicated. The morning of discharge she was seen by electrophysiology. Electrophysiology planned to implant a loop recorder and perform a tilt test. They will schedule a wound check in ~10 days and a second follow-up appointment where the results will be discussed.  She has agreed to placement today.    # Parkinson disease: Mrs. Axelson is followed by Dr Jannifer Franklin from neurology. She was last seen on 05/14/14 with normal neuro exam but difficulty rising from seated position noted in the record. She also reported episodes of presyncope that have gotten more frequent over time. I appreciated some mild cogwheel rigidity on admission exam. Pt on carbidopa-levodopa 25-250 po TIDAC at home, which was continued on admission. Her Parkinson disease was stable during her stay. She was followed by neurology.  # Dyslipidemia: Mrs. Arico's present lipid panel was notable for triglycerides of 220. She is not on a statin at home so she was started on atorvastatin 40 mg po daily and gemfibrozil 600 mg po BIDAC.  # Depression/Anxiety: Mrs. Kronk was maintained on her home Celexa 30 mg po qhs and Xanax 0.5 mg po TID. She shared with [place] several times that she felt caregiver burden as her husband has severe dementia. She felt she had to leave here as soon as medically safe to provide care to him but also acknowledged she can't take care of him unless she takes care of herself.  #GERD: This issue was stable and she was maintained on her home protonix  40 mg po daily  Discharge Vitals:   BP 134/65  Pulse 74  Temp(Src) 97.9 F (36.6 C) (Oral)  Resp 18  Ht 5' (1.524 m)  Wt 201 lb (91.173 kg)  BMI 39.26 kg/m2  SpO2 93%  Discharge Physical Exam: General Appearance: Alert, cooperative, no distress, appears stated age  Head: Normocephalic, without obvious abnormality, atraumatic, PERRL, EOMI, MMM  Neck: No carotid bruit or JVD  Back: Symmetric, no curvature  Lungs: Clear to auscultation bilaterally, no crackles/wheezes/rhonchi, respirations unlabored  Chest Wall: No tenderness or deformity  Heart: Regular rate and rhythm, S1 and S2 normal, no murmur, rub or gallop  Abdomen: Soft, non-tender, bowel sounds active all four quadrants, no masses, no organomegaly  Extremities: Extremities normal, atraumatic, no cyanosis or edema  Pulses: 2+  and symmetric radial and DP pulses  Neurologic: A&O x 3, CNII-XII intact, decreased grip on L compared to R hand (stable), otherwise L and R extremity strength symmetric, no Babinkski    Discharge Labs:  No results found for this or any previous visit (from the past 24 hour(s)). Basic Metabolic Panel:  Recent Labs Lab 05/26/14 0011 05/27/14 0605  NA 139 144  K 4.6 4.4  CL 102 104  CO2 28 28  GLUCOSE 128* 104*  BUN 26* 24*  CREATININE 1.07 1.09  CALCIUM 10.0 9.7   Liver Function Tests:  Recent Labs Lab 05/25/14 1204  AST 26  ALT 9  ALKPHOS 40  BILITOT 0.3  PROT 7.3  ALBUMIN 4.0   No results found for this basename: LIPASE, AMYLASE,  in the last 168 hours No results found for this basename: AMMONIA,  in the last 168 hours CBC:  Recent Labs Lab 05/25/14 1204 05/25/14 1216 05/27/14 0605  WBC 9.5  --  6.8  NEUTROABS 5.3  --   --   HGB 12.7 13.6 12.1  HCT 39.5 40.0 37.7  MCV 93.2  --  92.9  PLT 354  --  349   Cardiac Enzymes:  Recent Labs Lab 05/25/14 1828 05/26/14 0011 05/26/14 0638  TROPONINI <0.30 <0.30 <0.30   BNP: No results found for this basename: PROBNP,   in the last 168 hours D-Dimer: No results found for this basename: DDIMER,  in the last 168 hours CBG: No results found for this basename: GLUCAP,  in the last 168 hours Hemoglobin A1C:  Recent Labs Lab 05/26/14 0011  HGBA1C 6.2*   Fasting Lipid Panel:  Recent Labs Lab 05/26/14 0011  CHOL 176  HDL 33*  LDLCALC 99  TRIG 220*  CHOLHDL 5.3   Thyroid Function Tests:  Recent Labs Lab 05/26/14 0800  TSH 2.770  FREET4 1.16   Coagulation:  Recent Labs Lab 05/25/14 1204  LABPROT 13.4  INR 1.02   Anemia Panel:  Recent Labs Lab 05/26/14 0800  VITAMINB12 813   Urine Drug Screen: Drugs of Abuse     Component Value Date/Time   LABOPIA NONE DETECTED 05/25/2014 1300   COCAINSCRNUR NONE DETECTED 05/25/2014 1300   LABBENZ POSITIVE* 05/25/2014 1300   AMPHETMU NONE DETECTED 05/25/2014 1300   THCU NONE DETECTED 05/25/2014 1300   LABBARB NONE DETECTED 05/25/2014 1300    Alcohol Level:  Recent Labs Lab 05/25/14 1204  ETH <11   Urinalysis:  Recent Labs Lab 05/25/14 1300  COLORURINE YELLOW  LABSPEC 1.020  PHURINE 6.0  GLUCOSEU NEGATIVE  HGBUR MODERATE*  BILIRUBINUR NEGATIVE  KETONESUR NEGATIVE  PROTEINUR NEGATIVE  UROBILINOGEN 0.2  NITRITE NEGATIVE  LEUKOCYTESUR MODERATE*  few bacteria on microscopy  Signed: Kelby Aline, MD 05/28/2014, 2:06 PM    Services Ordered on Discharge: none Equipment Ordered on Discharge: none

## 2014-05-28 NOTE — Clinical Social Work Note (Signed)
Previous covering CSW completed assessment. This CSW assisting with discharge.   CSW has faxed FL2 to [facility]. Discharge packet is complete and placed on pt's shadow chart. CSW left message with pt's husband, Zeta Bucy, regarding pt's discharge. EMS [facility]) has been arranged. Covering RN updated and provided with number for report.  Lubertha Sayres, MSW, [facility] Licensed Clinical Social Worker [phone] and [phone]

## 2014-05-28 NOTE — Consult Note (Signed)
ELECTROPHYSIOLOGY CONSULT NOTE    Patient ID: [name] MRN: [id number], DOB/AGE: Mar 17, 1930 80 y.o.  Admit date: 05/25/2014 Date of Consult: 05-28-14  Primary Physician: No primary provider on file. Primary Cardiologist: new to [facility]  Reason for Consultation: TIA - evaluate for implantable loop recorder  HPI:  [name] is a 80 y.o. female with a past medical history significant for hyperlipidemia, hypertension, Parkinson's disease, prior CVA, and multiple subsequent TIA's.  She was admitted on 05-25-14 with slurred speech and left sided hemiplegia.  Imaging did not demonstrate acute findings.  She has been monitored on telemetry which has demonstrated sinus rhythm with PAC's, but no sustained atrial arrhythmias.  EP has been asked to evaluate for placement of implantable loop recorder.   She also describes episodes of abrupt onset pre-syncope associated with lower extremity weakness and leg pain. These episodes occur every few weeks in various positions.  The majority of them are while standing, but they have also occurred seated.  She has not had frank syncope.  She denies palpitations, chest pain, recent fevers or chills, nausea or vomiting. She has chronic dependent lower extremity edema and shortness of breath with exertion.    Sje notes spells of palpitations which are assoc with dizziness which in her mind is the same dizziness as her primary complaint  An event monitor had been ordered as an outpatient by neurology to evaluate pre-syncopal/TIA spells, but the patient did not feel that she could handle the monitor as she cares for her husband with dementia.   Echo this admission demonstrated EF 40-98%, grade 1 diastolic dysfunction, prominent epicardial fat pad, mildly elevated pulmonary pressures, LA 25.   Past Medical History  Diagnosis Date  . Parkinson disease   . Cerebrovascular disease     CVA '03  . TIA (transient ischemic attack)     Multiple    . Depression   . Dyslipidemia   . Glaucoma   . GERD (gastroesophageal reflux disease)   . Obesity   . Endometriosis   . Paralysis agitans 08/31/2013  . Macular degeneration     bilateral  . Hypertension   . Asthma   . Anxiety   . Headache(784.0)   . Arthritis      Surgical History:  Past Surgical History  Procedure Laterality Date  . Gallbladder surgery    . Abdominal hysterectomy    . Tonsillectomy    . Cataract extraction, bilateral    . Pilonidal cyst resection       Prescriptions prior to admission  Medication Sig Dispense Refill  . ALPRAZolam (XANAX) 0.5 MG tablet Take 0.5 mg by mouth 3 (three) times daily.       Marland Kitchen aspirin EC 325 MG tablet Take 325 mg by mouth daily.      . carbidopa-levodopa (SINEMET IR) 25-250 MG per tablet Take 1 tablet by mouth 3 (three) times daily before meals. Patient takes at 7am, 12noon, and 5pm      . Cholecalciferol (VITAMIN D-3) 5000 UNITS TABS Take 5,000 Units by mouth daily at 12 noon.       . citalopram (CELEXA) 20 MG tablet Take 30 mg by mouth at bedtime.       . clopidogrel (PLAVIX) 75 MG tablet Take 75 mg by mouth at bedtime.       . dicyclomine (BENTYL) 20 MG tablet Take 20 mg by mouth 4 (four) times daily as needed (for IBS symptoms).       Marland Kitchen  fenofibrate 160 MG tablet Take 160 mg by mouth daily.      . fluticasone (FLONASE) 50 MCG/ACT nasal spray Place 2 sprays into the nose daily.      . folic acid-vitamin b complex-vitamin c-selenium-zinc (DIALYVITE) 3 MG TABS tablet Take 1 tablet by mouth daily.      Marland Kitchen losartan (COZAAR) 25 MG tablet Take 25 mg by mouth daily.      . montelukast (SINGULAIR) 10 MG tablet Take 10 mg by mouth daily.      . pantoprazole (PROTONIX) 40 MG tablet Take 40 mg by mouth daily.      Marland Kitchen talc (ZEASORB) powder Apply 1 application topically 2 (two) times daily as needed (for irritation).      . travoprost, benzalkonium, (TRAVATAN) 0.004 % ophthalmic solution Place 1 drop into both eyes at bedtime.         Inpatient Medications:  . ALPRAZolam  0.5 mg Oral TID  . aspirin EC  325 mg Oral Daily  . atorvastatin  40 mg Oral q1800  . carbidopa-levodopa  1 tablet Oral TID AC  . citalopram  30 mg Oral QHS  . clopidogrel  75 mg Oral QHS  . fenofibrate  160 mg Oral Daily  . heparin  5,000 Units Subcutaneous 3 times per day  . latanoprost  1 drop Both Eyes QHS  . pantoprazole  40 mg Oral Daily  . polyethylene glycol  17 g Oral Daily    Allergies:  Allergies  Allergen Reactions  . Comtan [Entacapone]   . Mirapex [Pramipexole Dihydrochloride]   . Sulfa Antibiotics     History   Social History  . Marital Status: Married    Spouse Name: N/A    Number of Children: 4  . Years of Education: HS   Occupational History  . Retired    Social History Main Topics  . Smoking status: Never Smoker   . Smokeless tobacco: Never Used  . Alcohol Use: No  . Drug Use: No  . Sexual Activity: No   Other Topics Concern  . Not on file   Social History Narrative  . No narrative on file     Family History  Problem Relation Age of Onset  . Arthritis Brother   . Skin cancer Brother   . Heart attack Mother     BP 129/64  Pulse 67  Temp(Src) 97.5 F (36.4 C) (Oral)  Resp 18  Ht 5' (1.524 m)  Wt 201 lb (91.173 kg)  BMI 39.26 kg/m2  SpO2 96%  Physical Exam: Alert and oriented in no acute distress HENT- normal Eyes- EOMI, without scleral icterus Skin- warm and dry; without rashes LN-neg Neck- supple without thyromegaly, JVP-flat, carotids brisk and full without bruits Back-without CVAT or kyphosis Lungs-clear to auscultation CV-Regular rate and rhythm, nl S1 and S2, no murmurs gallops or rubs, S4-absent Abd-soft with active bowel sounds; no midline pulsation or hepatomegaly Pulses-intact femoral and distal MKS-without gross deformity Neuro- Ax O, CN3-12 intact, grossly normal motor and sensory function Affect engaging  Labs:   Lab Results  Component Value Date   WBC 6.8  05/27/2014   HGB 12.1 05/27/2014   HCT 37.7 05/27/2014   MCV 92.9 05/27/2014   PLT 349 05/27/2014    Recent Labs Lab 05/25/14 1204  05/27/14 0605  NA 141  < > 144  K 4.1  < > 4.4  CL 102  < > 104  CO2 26  < > 28  BUN 25*  < >  24*  CREATININE 0.89  < > 1.09  CALCIUM 9.9  < > 9.7  PROT 7.3  --   --   BILITOT 0.3  --   --   ALKPHOS 40  --   --   ALT 9  --   --   AST 26  --   --   GLUCOSE 89  < > 104*  < > = values in this interval not displayed.   Radiology/Studies: Dg Chest 2 View 05/26/2014   CLINICAL DATA:  Stroke.  EXAM: CHEST  2 VIEW  COMPARISON:  Chest x-ray 01/14/2010.  FINDINGS: Lung volumes are normal. No consolidative airspace disease. No pleural effusions. No pneumothorax. No pulmonary nodule or mass noted. Pulmonary vasculature and the cardiomediastinal silhouette are within normal limits. Atherosclerosis in the thoracic aorta.  IMPRESSION: 1.  No radiographic evidence of acute cardiopulmonary disease. 2. Atherosclerosis.   Electronically Signed   By: Vinnie Langton M.D.   On: 05/26/2014 09:39   Ct Head Wo Contrast 05/25/2014   CLINICAL DATA:  Code stroke, left-sided paralysis  EXAM: CT HEAD WITHOUT CONTRAST  TECHNIQUE: Contiguous axial images were obtained from the base of the skull through the vertex without intravenous contrast.  COMPARISON:  MRI brain 09/14/2013  FINDINGS: There is no evidence of mass effect, midline shift, or extra-axial fluid collections. There is no evidence of a space-occupying lesion or intracranial hemorrhage. There is no evidence of a cortical-based area of acute infarction. There is generalized cerebral atrophy. There is periventricular white matter low attenuation likely secondary to microangiopathy.  The ventricles and sulci are appropriate for the patient's age. The basal cisterns are patent.  Visualized portions of the orbits are unremarkable. The visualized portions of the paranasal sinuses and mastoid air cells are unremarkable. Cerebrovascular  atherosclerotic calcifications are noted.  The osseous structures are unremarkable.  IMPRESSION: No acute intracranial pathology.   Electronically Signed   By: Kathreen Devoid   On: 05/25/2014 12:20   Mr Brain Wo Contrast 05/25/2014   CLINICAL DATA:  Slurred speech. Left-sided hemiplegia. Evaluate for stroke.  EXAM: MRI HEAD WITHOUT CONTRAST  MRA HEAD WITHOUT CONTRAST  TECHNIQUE: Multiplanar, multiecho pulse sequences of the brain and surrounding structures were obtained without intravenous contrast. Angiographic images of the head were obtained using MRA technique without contrast.  COMPARISON:  Prior CT from earlier the same day as well as prior MRI from 09/14/2013  FINDINGS: MRI HEAD FINDINGS  Diffuse prominence of the CSF containing spaces is compatible with generalized cerebral atrophy. Extensive scattered and confluent T2/FLAIR hyperintensity within the periventricular and deep white matter both cerebral hemispheres is most consistent with chronic small vessel ischemic changes. Similar changes are seen within the pons.  No mass lesion, midline shift, or extra-axial fluid collection. Ventricles are normal in size without evidence of hydrocephalus.  No diffusion-weighted signal abnormality is identified to suggest acute intracranial infarct. Gray-white matter differentiation is maintained. Normal flow voids are seen within the intracranial vasculature. No intracranial hemorrhage identified.  The cervicomedullary junction is normal. Grade 1 anterolisthesis of C3 on C4 and C4 on C5 with associated degenerative changes noted within the partially visualized upper cervical spine.  Pituitary gland is within normal limits. Pituitary stalk is midline. The globes and optic nerves demonstrate a normal appearance with normal signal intensity. The  The bone marrow signal intensity is normal. Calvarium is intact. Visualized upper cervical spine is within normal limits.  Scalp soft tissues are unremarkable.  Paranasal sinuses  are clear.  No mastoid effusion.  MRA HEAD FINDINGS  The visualized portions of the distal cervical segments of the internal carotid arteries are widely patent with antegrade flow. Mild multi focal atherosclerotic irregularity present within the cavernous segments of the internal carotid arteries bilaterally without hemodynamically significant stenosis. Supra clinoid segments are widely patent and normal in appearance. The A1 segments are symmetric bilaterally. Anterior communicating artery is normal. Anterior cerebral arteries are well opacified.  M1 segments are with widely patent bilaterally without proximal branch occlusion. Short-segment stenosis of approximately 30% present within the mid left M1 segment (series 503, image 10). MCA branches are well opacified distally. No proximal branch occlusion or aneurysm within the anterior circulation.  The right vertebral artery is dominant. The right posterior inferior cerebral artery is patent. The left posterior inferior cerebral artery is not well visualized. Vertebrobasilar junction and basilar artery are within normal limits. No basilar tip stenosis or aneurysm. Superior cerebellar arteries are symmetric bilaterally and well opacified. Fetal origin of the right posterior cerebral artery noted. There is a short-segment stenosis of at approximately 70% within the distal right P2 segment (series 506, image 12). Left P1 and P2 segments are well opacified. No occlusive thrombus or hemodynamically significant stenosis seen within the posterior circulation.  IMPRESSION: MRI HEAD IMPRESSION:  1. No acute intracranial infarct or other abnormality identified. 2. Generalized age-related atrophy with advanced chronic microvascular ischemic changes involving the supratentorial white matter and pons.  MRA HEAD IMPRESSION:  1. No proximal branch occlusion identified within the intracranial circulation. 2. Short segment stenosis of approximately 30% within the mid -distal left M1  segment. 3. Short segment stenosis of approximately 70% within the distal right P2 segment. No other hemodynamically significant stenosis identified within the intracranial circulation. 4. Fetal origin of the right PCA.   Electronically Signed   By: Jeannine Boga M.D.   On: 05/25/2014 22:15   EKG: sinus rhythm, PR 224, otherwise normal intervals  TELEMETRY:  Sinus rhythm with PAC's   Principal Problem:   TIA (transient ischemic attack) Active Problems:   Dyslipidemia   Parkinson disease   H/O: CVA (cerebrovascular accident)   Palpitations   The pts spells are unusual with their post event confusion and fatigue that lasts a few days.  The fogginess of thought may be a clue as to a neurological event   Vasomotor events are also frequently assoc with recovery fatiguew that can last days and, esp in the context of her parkinsons, raises the possiblity as to an autonomic cause  The palpitations- irregular and not fast-- assoc with dizziness also raise a different potential triggering mechanism  I would favor tilt testing and then loop recorder insertion to look at these different possibilities   Should be able to go home from my point of view later today

## 2014-05-28 NOTE — Progress Notes (Signed)
Discharge order in. Pt will be discharged after loop recorder and tilt table testing.

## 2014-05-29 ENCOUNTER — Encounter (HOSPITAL_COMMUNITY): Payer: Self-pay | Admitting: *Deleted

## 2014-06-01 ENCOUNTER — Other Ambulatory Visit: Payer: Self-pay | Admitting: *Deleted

## 2014-06-01 NOTE — Discharge Summary (Signed)
INTERNAL MEDICINE ATTENDING DISCHARGE COSIGN   I evaluated the patient on the day of discharge and discussed the discharge plan with my resident team. I agree with the discharge documentation and disposition.   Madilyn Fireman 06/01/2014, 12:56 PM

## 2014-06-01 NOTE — Telephone Encounter (Signed)
[name] with [facility] called to get ok to start 5 weeks OT teaching. Talked with Dr Lynnae January - approved due to hospital notes. PCP none at this time. VO given to start home OT per Dr Lynnae January. Hilda Blades Sirius Woodford RN 06/01/14 4:25PM

## 2014-06-06 ENCOUNTER — Ambulatory Visit: Payer: Medicare Other

## 2014-06-06 ENCOUNTER — Ambulatory Visit (INDEPENDENT_AMBULATORY_CARE_PROVIDER_SITE_OTHER): Payer: Medicare Other | Admitting: *Deleted

## 2014-06-06 DIAGNOSIS — G459 Transient cerebral ischemic attack, unspecified: Secondary | ICD-10-CM

## 2014-06-06 LAB — MDC_IDC_ENUM_SESS_TYPE_INCLINIC
Date Time Interrogation Session: 20150805103816
MDC IDC SET ZONE DETECTION INTERVAL: 410 ms
Zone Setting Detection Interval: 2000 ms
Zone Setting Detection Interval: 3000 ms

## 2014-06-06 NOTE — Progress Notes (Signed)
Wound check in clinic s/p linq implant.  Wound well healed without redness or edema. Pt with 0 tachy episodes; 0 brady episodes; 0 asystole; 3 symptom episodes when pt felt that her legs were "giving out on her"---SR for all. Plan to follow up via [facility] and with SK on 10-1 @ 945am.

## 2014-06-27 ENCOUNTER — Ambulatory Visit (INDEPENDENT_AMBULATORY_CARE_PROVIDER_SITE_OTHER): Payer: Medicare Other | Admitting: *Deleted

## 2014-06-27 DIAGNOSIS — G459 Transient cerebral ischemic attack, unspecified: Secondary | ICD-10-CM

## 2014-06-28 ENCOUNTER — Encounter: Payer: Self-pay | Admitting: Internal Medicine

## 2014-06-29 ENCOUNTER — Encounter: Payer: Self-pay | Admitting: Internal Medicine

## 2014-07-05 NOTE — Progress Notes (Signed)
Loop recorder 

## 2014-07-10 ENCOUNTER — Encounter: Payer: Self-pay | Admitting: Internal Medicine

## 2014-07-17 LAB — MDC_IDC_ENUM_SESS_TYPE_REMOTE
Date Time Interrogation Session: 20150822144114
MDC IDC SET ZONE DETECTION INTERVAL: 3000 ms
Zone Setting Detection Interval: 2000 ms
Zone Setting Detection Interval: 410 ms

## 2014-07-27 ENCOUNTER — Ambulatory Visit (INDEPENDENT_AMBULATORY_CARE_PROVIDER_SITE_OTHER): Payer: Medicare Other | Admitting: *Deleted

## 2014-07-27 DIAGNOSIS — R55 Syncope and collapse: Secondary | ICD-10-CM

## 2014-07-31 ENCOUNTER — Encounter: Payer: Self-pay | Admitting: *Deleted

## 2014-08-02 ENCOUNTER — Encounter: Payer: Medicare Other | Admitting: Internal Medicine

## 2014-08-10 LAB — MDC_IDC_ENUM_SESS_TYPE_REMOTE
Date Time Interrogation Session: 20150927123342
MDC IDC SET ZONE DETECTION INTERVAL: 2000 ms
Zone Setting Detection Interval: 3000 ms
Zone Setting Detection Interval: 410 ms

## 2014-08-10 NOTE — Progress Notes (Signed)
Loop recorder 

## 2014-08-17 ENCOUNTER — Encounter: Payer: Self-pay | Admitting: Internal Medicine

## 2014-08-27 ENCOUNTER — Ambulatory Visit (INDEPENDENT_AMBULATORY_CARE_PROVIDER_SITE_OTHER): Payer: Medicare Other | Admitting: *Deleted

## 2014-08-27 DIAGNOSIS — R55 Syncope and collapse: Secondary | ICD-10-CM

## 2014-08-31 NOTE — Progress Notes (Signed)
Loop recorder 

## 2014-09-06 LAB — MDC_IDC_ENUM_SESS_TYPE_REMOTE
Date Time Interrogation Session: 20150928123451
Zone Setting Detection Interval: 2000 ms
Zone Setting Detection Interval: 3000 ms
Zone Setting Detection Interval: 410 ms

## 2014-09-13 ENCOUNTER — Encounter: Payer: Self-pay | Admitting: Internal Medicine

## 2014-09-19 ENCOUNTER — Ambulatory Visit (INDEPENDENT_AMBULATORY_CARE_PROVIDER_SITE_OTHER): Payer: Medicare Other | Admitting: Neurology

## 2014-09-19 ENCOUNTER — Encounter: Payer: Self-pay | Admitting: Neurology

## 2014-09-19 VITALS — BP 134/73 | HR 74 | Ht 60.0 in | Wt 203.8 lb

## 2014-09-19 DIAGNOSIS — G20 Parkinson's disease: Secondary | ICD-10-CM

## 2014-09-19 DIAGNOSIS — R42 Dizziness and giddiness: Secondary | ICD-10-CM

## 2014-09-19 NOTE — Progress Notes (Signed)
Reason for visit: parkinsonism  [name] is an 80 y.o. 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 The patient is on Sinemet, and she seems to be relatively stable with her ability to ambulate. The patient denies any falls, and she walks with a walker. The patient is having a lot of shortness of breath with her walking, and she may have to sit down and rest if she is walking long distances. She tries to exercise regularly. She remains on Sinemet, tolerating the medication well. She denies any alteration in her functional level since last seen. She was admitted to the hospital in July 2015 with an episode of left-sided weakness. Evaluation for cerebrovascular disease did not reveal evidence of a stroke. A loop recorder was placed, and she is followed by Dr. Caryl Comes for this. The patient returns to this office for an evaluation.  Past Medical History  Diagnosis Date  . Parkinson disease   . Cerebrovascular disease     CVA '03  . TIA (transient ischemic attack)     Multiple  . Depression   . Dyslipidemia   . Glaucoma   . GERD (gastroesophageal reflux disease)   . Obesity   . Endometriosis   . Paralysis agitans 08/31/2013  . Macular degeneration     bilateral  . Hypertension   . Asthma   . Anxiety   . Headache(784.0)   . Arthritis     Past Surgical History  Procedure Laterality Date  . Gallbladder surgery    . Abdominal hysterectomy    . Tonsillectomy    . Cataract extraction, bilateral    . Pilonidal cyst resection    . Tilt table study  05-28-14    orthostatic hypertension  . Loop recorder implant  05-28-14    MDT LINQ implanted by Dr Caryl Comes for cryptogenic stroke/palpitations    Family History  Problem Relation Age of Onset  . Arthritis Brother   . Skin cancer Brother   . Heart attack Mother     Social history:  reports that she  has never smoked. She has never used smokeless tobacco. She reports that she does not drink alcohol or use illicit drugs.    Allergies  Allergen Reactions  . Comtan [Entacapone]   . Mirapex [Pramipexole Dihydrochloride]   . Sulfa Antibiotics     Medications:  Current Outpatient Prescriptions on File Prior to Visit  Medication Sig Dispense Refill  . ALPRAZolam (XANAX) 0.5 MG tablet Take 0.5 mg by mouth 3 (three) times daily.     . Alum & Mag Hydroxide-Simeth (ANTACID ANTI-GAS PO) Take by mouth as needed.    Marland Kitchen aspirin EC 325 MG tablet Take 325 mg by mouth daily.    . carbidopa-levodopa (SINEMET IR) 25-250 MG per tablet Take 1 tablet by mouth 3 (three) times daily before meals. Patient takes at 7am, 12noon, and 5pm    . Cholecalciferol (VITAMIN D-3) 5000 UNITS TABS Take 5,000 Units by mouth daily at 12 noon.     . citalopram (CELEXA) 20 MG tablet Take 30 mg by mouth at bedtime.     . clopidogrel (PLAVIX) 75 MG tablet Take 75 mg by mouth at bedtime.     Marland Kitchen dextromethorphan-guaiFENesin (ROBITUSSIN-DM) 10-100 MG/5ML liquid Take 10 mLs by mouth every 4 (four) hours as needed for cough.    . dicyclomine (BENTYL) 20 MG tablet Take 20  mg by mouth 4 (four) times daily as needed (for IBS symptoms).     . fenofibrate 160 MG tablet Take 160 mg by mouth daily.    . fluticasone (FLONASE) 50 MCG/ACT nasal spray Place 2 sprays into the nose daily.    . folic acid-vitamin b complex-vitamin c-selenium-zinc (DIALYVITE) 3 MG TABS tablet Take 1 tablet by mouth daily.    Marland Kitchen guaiFENesin (MUCINEX) 600 MG 12 hr tablet Take 600 mg by mouth 2 (two) times daily as needed.    Marland Kitchen losartan (COZAAR) 25 MG tablet Take 25 mg by mouth daily.    . montelukast (SINGULAIR) 10 MG tablet Take 10 mg by mouth daily.    . naproxen sodium (ANAPROX) 220 MG tablet Take 220 mg by mouth 2 (two) times daily with a meal.    . pantoprazole (PROTONIX) 40 MG tablet Take 40 mg by mouth daily.    . polyethylene glycol (MIRALAX / GLYCOLAX) packet  Take 17 g by mouth as needed.    . talc (ZEASORB) powder Apply 1 application topically 2 (two) times daily as needed (for irritation).    . travoprost, benzalkonium, (TRAVATAN) 0.004 % ophthalmic solution Place 1 drop into both eyes at bedtime.     No current facility-administered medications on file prior to visit.    ROS:  Out of a complete 14 system review of symptoms, the patient complains only of the following symptoms, and all other reviewed systems are negative.  Excessive sweating Ringing in the ears, runny nose, drooling Light sensitivity Cough, wheezing Restless legs Environmental allergies Joint pain, joint swelling, muscle cramps Moles, itching  Blood pressure 134/73, pulse 74, height 5' (1.524 m), weight 203 lb 12.8 oz (92.443 kg).  Physical Exam  General: The patient is alert and cooperative at the time of the examination. The patient is moderately to markedly obese.  Skin: No significant peripheral edema is noted.   Neurologic Exam  Mental status: The patient is oriented x 3.  Cranial nerves: Facial symmetry is present. Speech is normal, no aphasia or dysarthria is noted. Extraocular movements are full. Visual fields are full. Mild masking of the face is seen.  Motor: The patient has good strength in all 4 extremities.  Sensory examination: soft touch sensation on the face, arms, and legs is symmetric.  Coordination: The patient has good finger-nose-finger and heel-to-shin bilaterally.  Gait and station: The patient has a some difficulty arising from a seated position. The patient usually uses a walker for ambulation. Gait is slightly wide-based, and the patient does have some arm swing bilaterally that appears to be symmetric. No tremor is seen. Romberg is negative. No drift is seen.  Reflexes: Deep tendon reflexes are symmetric.   MRI brain, MRA head 05/25/2014:  IMPRESSION: MRI HEAD IMPRESSION:  1. No acute intracranial infarct or other  abnormality identified. 2. Generalized age-related atrophy with advanced chronic microvascular ischemic changes involving the supratentorial white matter and pons.  MRA HEAD IMPRESSION:  1. No proximal branch occlusion identified within the intracranial circulation. 2. Short segment stenosis of approximately 30% within the mid -distal left M1 segment. 3. Short segment stenosis of approximately 70% within the distal right P2 segment. No other hemodynamically significant stenosis identified within the intracranial circulation. 4. Fetal origin of the right PCA.   Assessment/Plan:  1. Parkinsonism  2. Gait disorder  The patient appears to have some features of Parkinson's disease, but her walking pattern does not appear to be completely consistent with true Parkinson's disease. The  patient has parkinsonism. She is on Sinemet, and she has remained fairly stable, I will not alter treatment at this time. The patient will follow-up in 4 or 5 months. The patient does have a moderate level small vessel disease by MRI, some of her gait issues may be associated with this.  Jill Alexanders MD 09/19/2014 6:32 PM  [facility] [address] [place],  [address]  Phone [phone] Fax [phone]

## 2014-09-19 NOTE — Patient Instructions (Signed)
Parkinson Disease Parkinson disease is a disorder of the central nervous system, which includes the brain and spinal cord. A person with this disease slowly loses the ability to completely control body movements. Within the brain, there is a group of nerve cells (basal ganglia) that help control movement. The basal ganglia are damaged and do not work properly in a person with Parkinson disease. In addition, the basal ganglia produce and use a brain chemical called dopamine. The dopamine chemical sends messages to other parts of the body to control and coordinate body movements. Dopamine levels are low in a person with Parkinson disease. If the dopamine levels are low, then the body does not receive the correct messages it needs to move normally.  CAUSES  The exact reason why the basal ganglia get damaged is not known. Some medical researchers have thought that infection, genes, environment, and certain medicines may contribute to the cause.  SYMPTOMS   An early symptom of Parkinson disease is often an uncontrolled shaking (tremor) of the hands. The tremor will often disappear when the affected hand is consciously used.  As the disease progresses, walking, talking, getting out of a chair, and new movements become more difficult.  Muscles get stiff and movements become slower.  Balance and coordination become harder.  Depression, trouble swallowing, urinary problems, constipation, and sleep problems can occur.  Later in the disease, memory and thought processes may deteriorate. DIAGNOSIS  There are no specific tests to diagnose Parkinson disease. You may be referred to a neurologist for evaluation. Your caregiver will ask about your medical history, symptoms, and perform a physical exam. Blood tests and imaging tests of your brain may be performed to rule out other diseases. The imaging tests may include an MRI or a CT scan. TREATMENT  The goal of treatment is to relieve symptoms. Medicines may be  prescribed once the symptoms become troublesome. Medicine will not stop the progression of the disease, but medicine can make movement and balance better and help control tremors. Speech and occupational therapy may also be prescribed. Sometimes, surgical treatment of the brain can be done in young people. HOME CARE INSTRUCTIONS  Get regular exercise and rest periods during the day to help prevent exhaustion and depression.  If getting dressed becomes difficult, replace buttons and zippers with Velcro and elastic on your clothing.  Take all medicine as directed by your caregiver.  Install grab bars or railings in your home to prevent falls.  Go to speech or occupational therapy as directed.  Keep all follow-up visits as directed by your caregiver. SEEK MEDICAL CARE IF:  Your symptoms are not controlled with your medicine.  You fall.  You have trouble swallowing or choke on your food. MAKE SURE YOU:  Understand these instructions.  Will watch your condition.  Will get help right away if you are not doing well or get worse. Document Released: 10/16/2000 Document Revised: 02/13/2013 Document Reviewed: 11/18/2011 [employer] Patient Information 2015 [employer]. This information is not intended to replace advice given to you by your health care provider. Make sure you discuss any questions you have with your health care provider.  

## 2014-09-24 ENCOUNTER — Ambulatory Visit (INDEPENDENT_AMBULATORY_CARE_PROVIDER_SITE_OTHER): Payer: Medicare Other | Admitting: *Deleted

## 2014-09-24 DIAGNOSIS — R55 Syncope and collapse: Secondary | ICD-10-CM

## 2014-09-24 LAB — MDC_IDC_ENUM_SESS_TYPE_INCLINIC

## 2014-09-24 NOTE — Progress Notes (Signed)
Loop check in clinic.  Pt with 0 tachy episodes; 0 brady episodes; 0 asystole.  1 accidental patient recording.  Follow up via [facility] and with Dr. Caryl Comes in July

## 2014-09-25 ENCOUNTER — Ambulatory Visit (INDEPENDENT_AMBULATORY_CARE_PROVIDER_SITE_OTHER): Payer: Medicare Other | Admitting: *Deleted

## 2014-09-25 DIAGNOSIS — R55 Syncope and collapse: Secondary | ICD-10-CM

## 2014-09-25 LAB — MDC_IDC_ENUM_SESS_TYPE_REMOTE

## 2014-09-26 ENCOUNTER — Encounter: Payer: Self-pay | Admitting: Cardiology

## 2014-10-03 NOTE — Progress Notes (Signed)
Loop recorder 

## 2014-10-11 ENCOUNTER — Encounter (HOSPITAL_COMMUNITY): Payer: Self-pay | Admitting: Internal Medicine

## 2014-10-25 ENCOUNTER — Ambulatory Visit (INDEPENDENT_AMBULATORY_CARE_PROVIDER_SITE_OTHER): Payer: Medicare Other | Admitting: *Deleted

## 2014-10-25 DIAGNOSIS — R55 Syncope and collapse: Secondary | ICD-10-CM

## 2014-10-25 LAB — MDC_IDC_ENUM_SESS_TYPE_REMOTE

## 2014-10-31 ENCOUNTER — Encounter: Payer: Self-pay | Admitting: Internal Medicine

## 2014-10-31 NOTE — Progress Notes (Signed)
Loop recorder 

## 2014-11-23 ENCOUNTER — Ambulatory Visit (INDEPENDENT_AMBULATORY_CARE_PROVIDER_SITE_OTHER): Payer: Self-pay | Admitting: *Deleted

## 2014-11-23 DIAGNOSIS — R55 Syncope and collapse: Secondary | ICD-10-CM

## 2014-11-29 ENCOUNTER — Encounter: Payer: Self-pay | Admitting: Internal Medicine

## 2014-11-29 NOTE — Progress Notes (Signed)
Loop recorder 

## 2014-12-10 ENCOUNTER — Encounter: Payer: Self-pay | Admitting: Internal Medicine

## 2014-12-16 LAB — MDC_IDC_ENUM_SESS_TYPE_REMOTE
Date Time Interrogation Session: 20160126150745
MDC IDC SET ZONE DETECTION INTERVAL: 2000 ms
Zone Setting Detection Interval: 3000 ms
Zone Setting Detection Interval: 410 ms

## 2014-12-24 ENCOUNTER — Ambulatory Visit (INDEPENDENT_AMBULATORY_CARE_PROVIDER_SITE_OTHER): Payer: Medicare Other | Admitting: *Deleted

## 2014-12-24 DIAGNOSIS — R55 Syncope and collapse: Secondary | ICD-10-CM

## 2014-12-25 NOTE — Progress Notes (Signed)
Loop recorder 

## 2015-01-09 LAB — MDC_IDC_ENUM_SESS_TYPE_REMOTE
Date Time Interrogation Session: 20160210101945
MDC IDC SET ZONE DETECTION INTERVAL: 2000 ms
MDC IDC SET ZONE DETECTION INTERVAL: 410 ms
Zone Setting Detection Interval: 3000 ms

## 2015-01-18 ENCOUNTER — Encounter: Payer: Self-pay | Admitting: Internal Medicine

## 2015-01-23 ENCOUNTER — Ambulatory Visit (INDEPENDENT_AMBULATORY_CARE_PROVIDER_SITE_OTHER): Payer: Medicare Other | Admitting: *Deleted

## 2015-01-23 DIAGNOSIS — R55 Syncope and collapse: Secondary | ICD-10-CM | POA: Diagnosis not present

## 2015-01-23 LAB — MDC_IDC_ENUM_SESS_TYPE_REMOTE
MDC IDC SESS DTM: 20160321231818
MDC IDC SET ZONE DETECTION INTERVAL: 3000 ms
Zone Setting Detection Interval: 2000 ms
Zone Setting Detection Interval: 410 ms

## 2015-01-29 ENCOUNTER — Encounter: Payer: Self-pay | Admitting: Internal Medicine

## 2015-01-29 NOTE — Progress Notes (Signed)
Loop recorder 

## 2015-02-05 ENCOUNTER — Ambulatory Visit (INDEPENDENT_AMBULATORY_CARE_PROVIDER_SITE_OTHER): Payer: Medicare Other | Admitting: Neurology

## 2015-02-05 ENCOUNTER — Encounter: Payer: Self-pay | Admitting: Neurology

## 2015-02-05 VITALS — BP 133/67 | HR 78 | Ht 60.0 in | Wt 205.8 lb

## 2015-02-05 DIAGNOSIS — R42 Dizziness and giddiness: Secondary | ICD-10-CM | POA: Diagnosis not present

## 2015-02-05 DIAGNOSIS — R269 Unspecified abnormalities of gait and mobility: Secondary | ICD-10-CM | POA: Diagnosis not present

## 2015-02-05 DIAGNOSIS — G20 Parkinson's disease: Secondary | ICD-10-CM

## 2015-02-05 HISTORY — DX: Unspecified abnormalities of gait and mobility: R26.9

## 2015-02-05 NOTE — Progress Notes (Signed)
Reason for visit: Parkinson's disease  [name] is an 81 y.o. female  History of present illness:  [name] is an 81 year old right-handed white female with a history of parkinsonism. The patient has been placed on Sinemet, and she believes that she has gained some benefit with the medication. She was falling frequently prior to going on the drug, but now she has not fallen any since being on the Sinemet. The patient is remaining active, she walks on a daily basis, and she uses a stationary bike for exercise. She is trying to lose weight. She has a husband with dementia, and she indicates that this is getting worse over time, and it is taking more of her energy to care for him. The patient denies any significant memory issues per se. She returns to this office for an evaluation. Since last seen, she does not believe that she has lost any physical abilities.  Past Medical History  Diagnosis Date  . Parkinson disease   . Cerebrovascular disease     CVA '03  . TIA (transient ischemic attack)     Multiple  . Depression   . Dyslipidemia   . Glaucoma   . GERD (gastroesophageal reflux disease)   . Obesity   . Endometriosis   . Paralysis agitans 08/31/2013  . Macular degeneration     bilateral  . Hypertension   . Asthma   . Anxiety   . Headache(784.0)   . Arthritis     Past Surgical History  Procedure Laterality Date  . Gallbladder surgery    . Abdominal hysterectomy    . Tonsillectomy    . Cataract extraction, bilateral    . Pilonidal cyst resection    . Tilt table study  05-28-14    orthostatic hypertension  . Loop recorder implant  05-28-14    MDT LINQ implanted by Dr Caryl Comes for cryptogenic stroke/palpitations  . Tilt table study N/A 05/28/2014    Procedure: TILT TABLE STUDY;  Surgeon: Deboraha Sprang, MD;  Location[facility] CATH LAB;  Service: Cardiovascular;  Laterality: N/A;  . Loop recorder implant N/A 05/28/2014    Procedure: LOOP RECORDER IMPLANT;  Surgeon:  Deboraha Sprang, MD;  Location: [facility] CATH LAB;  Service: Cardiovascular;  Laterality: N/A;    Family History  Problem Relation Age of Onset  . Arthritis Brother   . Skin cancer Brother   . Heart attack Mother     Social history:  reports that she has never smoked. She has never used smokeless tobacco. She reports that she does not drink alcohol or use illicit drugs.    Allergies  Allergen Reactions  . Comtan [Entacapone]   . Mirapex [Pramipexole Dihydrochloride]   . Sulfa Antibiotics     Medications:  Prior to Admission medications   Medication Sig Start Date End Date Taking? Authorizing Provider  ALPRAZolam Duanne Moron) 0.5 MG tablet Take 0.5 mg by mouth 3 (three) times daily.    Yes Historical Provider, MD  alum & mag hydroxide-simeth (GELUSIL) 200-200-25 MG suspension Chew 1 tablet by mouth every 6 (six) hours as needed for indigestion or heartburn.   Yes Historical Provider, MD  aspirin EC 325 MG tablet Take 325 mg by mouth daily.   Yes Historical Provider, MD  busPIRone (BUSPAR) 5 MG tablet Take 5 mg by mouth every 6 (six) hours as needed.   Yes Historical Provider, MD  carbidopa-levodopa (SINEMET IR) 25-250 MG per tablet Take 1 tablet by mouth 3 (three) times daily  before meals. Patient takes at 7am, 12noon, and 5pm 08/16/13  Yes Historical Provider, MD  Cholecalciferol (VITAMIN D-3) 5000 UNITS TABS Take 5,000 Units by mouth daily at 12 noon.    Yes Historical Provider, MD  citalopram (CELEXA) 20 MG tablet Take 30 mg by mouth at bedtime.  08/16/13  Yes Historical Provider, MD  clopidogrel (PLAVIX) 75 MG tablet Take 75 mg by mouth at bedtime.    Yes Historical Provider, MD  dextromethorphan-guaiFENesin (ROBITUSSIN-DM) 10-100 MG/5ML liquid Take 10 mLs by mouth every 4 (four) hours as needed for cough.   Yes Historical Provider, MD  dicyclomine (BENTYL) 20 MG tablet Take 20 mg by mouth 4 (four) times daily as needed (for IBS symptoms).    Yes Historical Provider, MD  fenofibrate 160 MG  tablet Take 160 mg by mouth daily.   Yes Historical Provider, MD  fluticasone (FLONASE) 50 MCG/ACT nasal spray Place 2 sprays into the nose daily.   Yes Historical Provider, MD  folic acid-vitamin b complex-vitamin c-selenium-zinc (DIALYVITE) 3 MG TABS tablet Take 1 tablet by mouth daily.   Yes Historical Provider, MD  guaiFENesin (MUCINEX) 600 MG 12 hr tablet Take 600 mg by mouth 2 (two) times daily as needed.   Yes Historical Provider, MD  guaiFENesin (MUCINEX) 600 MG 12 hr tablet Take 600 mg by mouth 2 (two) times daily.   Yes Historical Provider, MD  losartan (COZAAR) 25 MG tablet Take 25 mg by mouth daily. 08/16/13  Yes Historical Provider, MD  miconazole (MICOTIN) 2 % powder Apply topically as needed for itching.   Yes Historical Provider, MD  montelukast (SINGULAIR) 10 MG tablet Take 10 mg by mouth daily. 08/16/13  Yes Historical Provider, MD  Multiple Vitamin (MULTIVITAMIN) tablet Take 1 tablet by mouth daily.   Yes Historical Provider, MD  naproxen sodium (ANAPROX) 220 MG tablet Take 220 mg by mouth 2 (two) times daily as needed.    Yes Historical Provider, MD  pantoprazole (PROTONIX) 40 MG tablet Take 40 mg by mouth daily.   Yes Historical Provider, MD  polyethylene glycol (MIRALAX / GLYCOLAX) packet Take 17 g by mouth as needed.   Yes Historical Provider, MD  Propylhexedrine [facility] NA) Place into the nose every 6 (six) hours as needed.   Yes Historical Provider, MD  simethicone (MYLICON) 237 MG chewable tablet Chew 125 mg by mouth every 6 (six) hours as needed for flatulence.   Yes Historical Provider, MD  travoprost, benzalkonium, (TRAVATAN) 0.004 % ophthalmic solution Place 1 drop into both eyes at bedtime.   Yes Historical Provider, MD    ROS:  Out of a complete 14 system review of symptoms, the patient complains only of the following symptoms, and all other reviewed systems are negative.  Ringing in the ears Wheezing Swollen abdomen Frequent infections  Blood pressure  133/67, pulse 78, height 5' (1.524 m), weight 205 lb 12.8 oz (93.35 kg).  Physical Exam  General: The patient is alert and cooperative at the time of the examination. The patient is markedly obese.  Skin: No significant peripheral edema is noted.   Neurologic Exam  Mental status: The patient is alert and oriented x 3 at the time of the examination. The patient has apparent normal recent and remote memory, with an apparently normal attention span and concentration ability. Mini-Mental Status Examination done today shows a total score of 30/30.   Cranial nerves: Facial symmetry is present. Speech is normal, no aphasia or dysarthria is noted. Extraocular movements are full. Visual  fields are full.  Motor: The patient has good strength in all 4 extremities.  Sensory examination: Soft touch sensation is symmetric on the face, arms, and legs.  Coordination: The patient has good finger-nose-finger and heel-to-shin bilaterally. No tremors are seen.  Gait and station: The patient has a good stride with walking, using a walker. She has good turns. Tandem gait is unsteady. Romberg is negative. No drift is seen.  Reflexes: Deep tendon reflexes are symmetric.   Assessment/Plan:  1. Parkinsonism  2. Gait disorder  The patient is stable with her underlying neurologic conditions. She is trying to exercise on a regular basis. I have encouraged this. She will continue her current Sinemet dosing taking 1 tablet 3 times daily. The patient believes that she has gained benefit with the medication, this would lead one towards the diagnosis of true Parkinson's disease. She will follow-up in about 5 months.  Jill Alexanders MD 02/05/2015 8:57 PM  [facility] [address] [place], [place] [address]  Phone [phone] Fax [phone]

## 2015-02-05 NOTE — Patient Instructions (Signed)
Parkinson Disease Parkinson disease is a disorder of the central nervous system, which includes the brain and spinal cord. A person with this disease slowly loses the ability to completely control body movements. Within the brain, there is a group of nerve cells (basal ganglia) that help control movement. The basal ganglia are damaged and do not work properly in a person with Parkinson disease. In addition, the basal ganglia produce and use a brain chemical called dopamine. The dopamine chemical sends messages to other parts of the body to control and coordinate body movements. Dopamine levels are low in a person with Parkinson disease. If the dopamine levels are low, then the body does not receive the correct messages it needs to move normally.  CAUSES  The exact reason why the basal ganglia get damaged is not known. Some medical researchers have thought that infection, genes, environment, and certain medicines may contribute to the cause.  SYMPTOMS   An early symptom of Parkinson disease is often an uncontrolled shaking (tremor) of the hands. The tremor will often disappear when the affected hand is consciously used.  As the disease progresses, walking, talking, getting out of a chair, and new movements become more difficult.  Muscles get stiff and movements become slower.  Balance and coordination become harder.  Depression, trouble swallowing, urinary problems, constipation, and sleep problems can occur.  Later in the disease, memory and thought processes may deteriorate. DIAGNOSIS  There are no specific tests to diagnose Parkinson disease. You may be referred to a neurologist for evaluation. Your caregiver will ask about your medical history, symptoms, and perform a physical exam. Blood tests and imaging tests of your brain may be performed to rule out other diseases. The imaging tests may include an MRI or a CT scan. TREATMENT  The goal of treatment is to relieve symptoms. Medicines may be  prescribed once the symptoms become troublesome. Medicine will not stop the progression of the disease, but medicine can make movement and balance better and help control tremors. Speech and occupational therapy may also be prescribed. Sometimes, surgical treatment of the brain can be done in young people. HOME CARE INSTRUCTIONS  Get regular exercise and rest periods during the day to help prevent exhaustion and depression.  If getting dressed becomes difficult, replace buttons and zippers with Velcro and elastic on your clothing.  Take all medicine as directed by your caregiver.  Install grab bars or railings in your home to prevent falls.  Go to speech or occupational therapy as directed.  Keep all follow-up visits as directed by your caregiver. SEEK MEDICAL CARE IF:  Your symptoms are not controlled with your medicine.  You fall.  You have trouble swallowing or choke on your food. MAKE SURE YOU:  Understand these instructions.  Will watch your condition.  Will get help right away if you are not doing well or get worse. Document Released: 10/16/2000 Document Revised: 02/13/2013 Document Reviewed: 11/18/2011 [employer] Patient Information 2015 [employer]. This information is not intended to replace advice given to you by your health care provider. Make sure you discuss any questions you have with your health care provider.  

## 2015-02-22 ENCOUNTER — Ambulatory Visit (INDEPENDENT_AMBULATORY_CARE_PROVIDER_SITE_OTHER): Payer: Medicare Other | Admitting: *Deleted

## 2015-02-22 DIAGNOSIS — R55 Syncope and collapse: Secondary | ICD-10-CM | POA: Diagnosis not present

## 2015-02-27 NOTE — Progress Notes (Signed)
Loop recorder 

## 2015-03-05 ENCOUNTER — Encounter: Payer: Self-pay | Admitting: Internal Medicine

## 2015-03-25 ENCOUNTER — Ambulatory Visit (INDEPENDENT_AMBULATORY_CARE_PROVIDER_SITE_OTHER): Payer: Medicare Other | Admitting: *Deleted

## 2015-03-25 DIAGNOSIS — R55 Syncope and collapse: Secondary | ICD-10-CM

## 2015-03-26 LAB — CUP PACEART REMOTE DEVICE CHECK
MDC IDC SESS DTM: 20160424172411
MDC IDC SET ZONE DETECTION INTERVAL: 2000 ms
MDC IDC SET ZONE DETECTION INTERVAL: 3000 ms
MDC IDC SET ZONE DETECTION INTERVAL: 410 ms

## 2015-03-26 NOTE — Progress Notes (Signed)
Loop recorder 

## 2015-04-05 LAB — CUP PACEART REMOTE DEVICE CHECK: MDC IDC SESS DTM: 20160603122559

## 2015-04-23 ENCOUNTER — Ambulatory Visit (INDEPENDENT_AMBULATORY_CARE_PROVIDER_SITE_OTHER): Payer: Medicare Other | Admitting: *Deleted

## 2015-04-23 ENCOUNTER — Encounter: Payer: Self-pay | Admitting: Internal Medicine

## 2015-04-23 DIAGNOSIS — R55 Syncope and collapse: Secondary | ICD-10-CM | POA: Diagnosis not present

## 2015-04-24 NOTE — Progress Notes (Signed)
Loop recorder 

## 2015-04-29 ENCOUNTER — Other Ambulatory Visit: Payer: Self-pay

## 2015-04-30 LAB — CUP PACEART REMOTE DEVICE CHECK: MDC IDC SESS DTM: 20160628102417

## 2015-05-23 ENCOUNTER — Ambulatory Visit: Payer: Medicare Other

## 2015-05-28 ENCOUNTER — Encounter: Payer: Self-pay | Admitting: Internal Medicine

## 2015-06-05 ENCOUNTER — Emergency Department (HOSPITAL_COMMUNITY): Payer: Medicare Other

## 2015-06-05 ENCOUNTER — Emergency Department (HOSPITAL_COMMUNITY)
Admission: EM | Admit: 2015-06-05 | Discharge: 2015-06-05 | Disposition: A | Discharge status: Home / Self Care | Payer: Medicare Other | Attending: Emergency Medicine | Admitting: Emergency Medicine

## 2015-06-05 ENCOUNTER — Encounter (HOSPITAL_COMMUNITY): Payer: Self-pay

## 2015-06-05 DIAGNOSIS — E785 Hyperlipidemia, unspecified: Secondary | ICD-10-CM | POA: Insufficient documentation

## 2015-06-05 DIAGNOSIS — E669 Obesity, unspecified: Secondary | ICD-10-CM | POA: Diagnosis not present

## 2015-06-05 DIAGNOSIS — W01198A Fall on same level from slipping, tripping and stumbling with subsequent striking against other object, initial encounter: Secondary | ICD-10-CM | POA: Diagnosis not present

## 2015-06-05 DIAGNOSIS — I10 Essential (primary) hypertension: Secondary | ICD-10-CM | POA: Insufficient documentation

## 2015-06-05 DIAGNOSIS — Z8673 Personal history of transient ischemic attack (TIA), and cerebral infarction without residual deficits: Secondary | ICD-10-CM | POA: Diagnosis not present

## 2015-06-05 DIAGNOSIS — Y939 Activity, unspecified: Secondary | ICD-10-CM | POA: Diagnosis not present

## 2015-06-05 DIAGNOSIS — G20 Parkinson's disease: Secondary | ICD-10-CM | POA: Diagnosis not present

## 2015-06-05 DIAGNOSIS — Y999 Unspecified external cause status: Secondary | ICD-10-CM | POA: Insufficient documentation

## 2015-06-05 DIAGNOSIS — M1990 Unspecified osteoarthritis, unspecified site: Secondary | ICD-10-CM | POA: Insufficient documentation

## 2015-06-05 DIAGNOSIS — Y92199 Unspecified place in other specified residential institution as the place of occurrence of the external cause: Secondary | ICD-10-CM | POA: Diagnosis not present

## 2015-06-05 DIAGNOSIS — F419 Anxiety disorder, unspecified: Secondary | ICD-10-CM | POA: Insufficient documentation

## 2015-06-05 DIAGNOSIS — Z79899 Other long term (current) drug therapy: Secondary | ICD-10-CM | POA: Diagnosis not present

## 2015-06-05 DIAGNOSIS — H409 Unspecified glaucoma: Secondary | ICD-10-CM | POA: Insufficient documentation

## 2015-06-05 DIAGNOSIS — J45909 Unspecified asthma, uncomplicated: Secondary | ICD-10-CM | POA: Insufficient documentation

## 2015-06-05 DIAGNOSIS — S4991XA Unspecified injury of right shoulder and upper arm, initial encounter: Secondary | ICD-10-CM | POA: Diagnosis present

## 2015-06-05 DIAGNOSIS — Z7982 Long term (current) use of aspirin: Secondary | ICD-10-CM | POA: Diagnosis not present

## 2015-06-05 DIAGNOSIS — Z8742 Personal history of other diseases of the female genital tract: Secondary | ICD-10-CM | POA: Diagnosis not present

## 2015-06-05 DIAGNOSIS — F329 Major depressive disorder, single episode, unspecified: Secondary | ICD-10-CM | POA: Insufficient documentation

## 2015-06-05 DIAGNOSIS — K219 Gastro-esophageal reflux disease without esophagitis: Secondary | ICD-10-CM | POA: Diagnosis not present

## 2015-06-05 DIAGNOSIS — S42301A Unspecified fracture of shaft of humerus, right arm, initial encounter for closed fracture: Secondary | ICD-10-CM | POA: Diagnosis not present

## 2015-06-05 DIAGNOSIS — Z7902 Long term (current) use of antithrombotics/antiplatelets: Secondary | ICD-10-CM | POA: Diagnosis not present

## 2015-06-05 DIAGNOSIS — W19XXXA Unspecified fall, initial encounter: Secondary | ICD-10-CM

## 2015-06-05 LAB — CBC WITH DIFFERENTIAL/PLATELET
BASOS PCT: 0 % (ref 0–1)
Basophils Absolute: 0 10*3/uL (ref 0.0–0.1)
EOS PCT: 1 % (ref 0–5)
Eosinophils Absolute: 0.1 10*3/uL (ref 0.0–0.7)
HCT: 38.9 % (ref 36.0–46.0)
Hemoglobin: 12.6 g/dL (ref 12.0–15.0)
LYMPHS PCT: 16 % (ref 12–46)
Lymphs Abs: 1.8 10*3/uL (ref 0.7–4.0)
MCH: 30.4 pg (ref 26.0–34.0)
MCHC: 32.4 g/dL (ref 30.0–36.0)
MCV: 93.7 fL (ref 78.0–100.0)
Monocytes Absolute: 0.3 10*3/uL (ref 0.1–1.0)
Monocytes Relative: 3 % (ref 3–12)
NEUTROS ABS: 8.9 10*3/uL — AB (ref 1.7–7.7)
Neutrophils Relative %: 80 % — ABNORMAL HIGH (ref 43–77)
Platelets: 378 10*3/uL (ref 150–400)
RBC: 4.15 MIL/uL (ref 3.87–5.11)
RDW: 13.4 % (ref 11.5–15.5)
WBC: 11.2 10*3/uL — AB (ref 4.0–10.5)

## 2015-06-05 LAB — PROTIME-INR
INR: 1.13 (ref 0.00–1.49)
PROTHROMBIN TIME: 14.6 s (ref 11.6–15.2)

## 2015-06-05 LAB — BASIC METABOLIC PANEL
ANION GAP: 8 (ref 5–15)
BUN: 31 mg/dL — AB (ref 6–20)
CALCIUM: 9.9 mg/dL (ref 8.9–10.3)
CHLORIDE: 105 mmol/L (ref 101–111)
CO2: 26 mmol/L (ref 22–32)
Creatinine, Ser: 1.17 mg/dL — ABNORMAL HIGH (ref 0.44–1.00)
GFR calc non Af Amer: 42 mL/min — ABNORMAL LOW (ref >60)
GFR, EST AFRICAN AMERICAN: 49 mL/min — AB (ref >60)
GLUCOSE: 155 mg/dL — AB (ref 65–99)
POTASSIUM: 4.3 mmol/L (ref 3.5–5.1)
Sodium: 139 mmol/L (ref 135–145)

## 2015-06-05 LAB — APTT: aPTT: 23 seconds — ABNORMAL LOW (ref 24–37)

## 2015-06-05 MED ORDER — FENTANYL CITRATE (PF) 100 MCG/2ML IJ SOLN
25.0000 ug | Freq: Once | INTRAMUSCULAR | Status: AC
  Administered 2015-06-05: 25 ug via INTRAVENOUS
  Filled 2015-06-05: qty 2

## 2015-06-05 MED ORDER — HYDROCODONE-ACETAMINOPHEN 5-325 MG PO TABS: 1.0000 | ORAL_TABLET | Freq: Four times a day (QID) | ORAL | Status: DC | PRN

## 2015-06-05 NOTE — ED Provider Notes (Signed)
CSN: [id number]     Arrival date & time 06/05/15  0901 History   First MD Initiated Contact with Patient 06/05/15 [phone]     Chief Complaint  Patient presents with  . Fall    HPI 81 year old female who presents with fall. She has previous history of CVA with residual left-sided weakness and takes Plavix. She also has a history of Parkinson's disease and at times have gait instability secondary to this. She was doing her laundry this morning, small hang up her laundry, she turned, lost balance, and fell against the wall and onto the floor. She did hit her head but did not have loss of consciousness. She she attempted to break her fall with her right hand, in sustained a severe right arm pain after her fall. She denies any headache, vision changes, speech changes, new focal weakness or numbness, chest pain, difficulty breathing, abdominal pain, or any other injuries.    Past Medical History  Diagnosis Date  . Parkinson disease   . Cerebrovascular disease     CVA '03  . TIA (transient ischemic attack)     Multiple  . Depression   . Dyslipidemia   . Glaucoma   . GERD (gastroesophageal reflux disease)   . Obesity   . Endometriosis   . Paralysis agitans 08/31/2013  . Macular degeneration     bilateral  . Hypertension   . Asthma   . Anxiety   . Headache(784.0)   . Arthritis   . Abnormality of gait 02/05/2015   Past Surgical History  Procedure Laterality Date  . Gallbladder surgery    . Abdominal hysterectomy    . Tonsillectomy    . Cataract extraction, bilateral    . Pilonidal cyst resection    . Tilt table study  05-28-14    orthostatic hypertension  . Loop recorder implant  05-28-14    MDT LINQ implanted by Dr Caryl Comes for cryptogenic stroke/palpitations  . Tilt table study N/A 05/28/2014    Procedure: TILT TABLE STUDY;  Surgeon: Deboraha Sprang, MD;  Location: [facility] CATH LAB;  Service: Cardiovascular;  Laterality: N/A;  . Loop recorder implant N/A 05/28/2014    Procedure: LOOP RECORDER  IMPLANT;  Surgeon: Deboraha Sprang, MD;  Location: [facility] CATH LAB;  Service: Cardiovascular;  Laterality: N/A;   Family History  Problem Relation Age of Onset  . Arthritis Brother   . Skin cancer Brother   . Heart attack Mother    History  Substance Use Topics  . Smoking status: Never Smoker   . Smokeless tobacco: Never Used  . Alcohol Use: No   OB History    No data available     Review of Systems 10/14 systems reviewed and are negative other than those stated in the HPI  Allergies  Comtan; Mirapex; and Sulfa antibiotics  Home Medications   Prior to Admission medications   Medication Sig Start Date End Date Taking? Authorizing Provider  ALPRAZolam Duanne Moron) 0.5 MG tablet Take 0.5 mg by mouth 3 (three) times daily.    Yes Historical Provider, MD  alum & mag hydroxide-simeth (MINTOX) 200-200-20 MG/5ML suspension Take 30 mLs by mouth 4 (four) times daily as needed for indigestion or heartburn.   Yes Historical Provider, MD  aspirin EC 325 MG tablet Take 325 mg by mouth daily.   Yes Historical Provider, MD  Calcium Carbonate Antacid (CALCIUM ANTACID EXTRA STRENGTH PO) Take 300-600 mg by mouth daily as needed (for heartburn).   Yes Historical Provider, MD  carbidopa-levodopa (SINEMET IR) 25-250 MG per tablet Take 1 tablet by mouth 3 (three) times daily before meals. Patient takes at 7am, 12noon, and 5pm 08/16/13  Yes Historical Provider, MD  Cholecalciferol (VITAMIN D-3) 5000 UNITS TABS Take 5,000 Units by mouth daily at 12 noon.    Yes Historical Provider, MD  citalopram (CELEXA) 20 MG tablet Take 30 mg by mouth at bedtime.  08/16/13  Yes Historical Provider, MD  clopidogrel (PLAVIX) 75 MG tablet Take 75 mg by mouth at bedtime.    Yes Historical Provider, MD  dicyclomine (BENTYL) 20 MG tablet Take 20 mg by mouth 4 (four) times daily as needed (for IBS symptoms).    Yes Historical Provider, MD  fenofibrate 160 MG tablet Take 160 mg by mouth daily.   Yes Historical Provider, MD   fluticasone (FLONASE) 50 MCG/ACT nasal spray Place 2 sprays into the nose daily.   Yes Historical Provider, MD  folic acid-vitamin b complex-vitamin c-selenium-zinc (DIALYVITE) 3 MG TABS tablet Take 1 tablet by mouth daily.   Yes Historical Provider, MD  guaiFENesin (MUCINEX) 600 MG 12 hr tablet Take 600 mg by mouth 2 (two) times daily.   Yes Historical Provider, MD  hydrocortisone cream (HYDROCORTISONE MAX ST) 1 % Apply 1 application topically 2 (two) times daily as needed for itching.   Yes Historical Provider, MD  losartan (COZAAR) 25 MG tablet Take 25 mg by mouth daily. 08/16/13  Yes Historical Provider, MD  montelukast (SINGULAIR) 10 MG tablet Take 10 mg by mouth daily. 08/16/13  Yes Historical Provider, MD  Multiple Vitamin (TAB-A-VITE) TABS Take 1 tablet by mouth daily.   Yes Historical Provider, MD  naproxen sodium (ANAPROX) 220 MG tablet Take 220 mg by mouth 2 (two) times daily as needed (for pain).    Yes Historical Provider, MD  pantoprazole (PROTONIX) 40 MG tablet Take 40 mg by mouth daily.   Yes Historical Provider, MD  polyethylene glycol (MIRALAX / GLYCOLAX) packet Take 17 g by mouth daily.    Yes Historical Provider, MD  promethazine (PHENERGAN) 12.5 MG suppository Place 12.5 mg rectally every 6 (six) hours as needed for nausea or vomiting.   Yes Historical Provider, MD  talc (ZEASORB) powder Apply 1 application topically 2 (two) times daily as needed (for irritation).   Yes Historical Provider, MD  travoprost, benzalkonium, (TRAVATAN) 0.004 % ophthalmic solution Place 1 drop into both eyes at bedtime.   Yes Historical Provider, MD  busPIRone (BUSPAR) 5 MG tablet Take 5 mg by mouth every 6 (six) hours as needed.    Historical Provider, MD  dextromethorphan-guaiFENesin (ROBITUSSIN-DM) 10-100 MG/5ML liquid Take 10 mLs by mouth every 4 (four) hours as needed for cough.    Historical Provider, MD  HYDROcodone-acetaminophen (NORCO/VICODIN) 5-325 MG per tablet Take 1 tablet by mouth every  6 (six) hours as needed for severe pain. 06/05/15   Forde Dandy, MD  Propylhexedrine [facility] NA) Place into the nose every 6 (six) hours as needed.    Historical Provider, MD  simethicone (MYLICON) 683 MG chewable tablet Chew 125 mg by mouth every 6 (six) hours as needed for flatulence.    Historical Provider, MD   BP 103/57 mmHg  Pulse 77  Temp(Src) 98.2 F (36.8 C) (Oral)  Resp 20  SpO2 93% Physical Exam Physical Exam  Constitutional: Well developed, well nourished, non-toxic, and in no acute distress. Comfortably conversant at bedside. Head: Normocephalic and atraumatic.  Mouth/Throat: Oropharynx is clear and moist.  Neck: Normal range of motion.  Neck supple. No cervical spine tenderness Cardiovascular: Normal rate and regular rhythm.   +2 radial and DP pulses bilaterally. Pulmonary/Chest: Effort normal and breath sounds normal. 6 no chest wall tenderness Abdominal: Soft. There is no tenderness. There is no rebound and no guarding.  Musculoskeletal: Mild deformity of the right upper humerus with inability to range of motion due to pain. No pelvic tenderness. No tenderness of the CTLS spine. Neurological: Alert, oriented to person, place, time, and situation, no facial droop, fluent speech, moves all extremities symmetrically, full strength dorsi/plantar flexion, knee and hip flexion/extension, and hand grip bilaterally. In tact sensation in all four extremities. Focused exam and of the right upper extremity reveals intact innervation involving the axillary, radial, median, and ulnar nerves. Skin: Skin is warm and dry. Small hematoma noted over forehead. Psychiatric: Cooperative  ED Course  Procedures (including critical care time) Labs Review Labs Reviewed  CBC WITH DIFFERENTIAL/PLATELET - Abnormal; Notable for the following:    WBC 11.2 (*)    Neutrophils Relative % 80 (*)    Neutro Abs 8.9 (*)    All other components within normal limits  BASIC METABOLIC PANEL - Abnormal;  Notable for the following:    Glucose, Bld 155 (*)    BUN 31 (*)    Creatinine, Ser 1.17 (*)    GFR calc non Af Amer 42 (*)    GFR calc Af Amer 49 (*)    All other components within normal limits  APTT - Abnormal; Notable for the following:    aPTT 23 (*)    All other components within normal limits  PROTIME-INR    Imaging Review Dg Shoulder Right  06/05/2015   CLINICAL DATA:  Fall today.  Shoulder pain  EXAM: RIGHT SHOULDER - 2+ VIEW  COMPARISON:  None.  FINDINGS: Comminuted fracture right humerus. Fracture involves the greater tuberosity as well as the femoral neck. There is comminuted fracture of the proximal humeral shaft with angulation and displacement. No dislocation. AC joint intact. No rib fracture  IMPRESSION: Comminuted displaced and angulated fracture proximal humerus.   Electronically Signed   By: Franchot Gallo M.D.   On: 06/05/2015 10:40   Dg Elbow 2 Views Right  06/05/2015   CLINICAL DATA:  Pain following fall  EXAM: RIGHT ELBOW - 2 VIEW  COMPARISON:  None.  FINDINGS: Frontal and lateral views were obtained. There is no demonstrable fracture or dislocation. No joint effusion. No appreciable joint space narrowing.  IMPRESSION: No demonstrable fracture or dislocation. No appreciable arthropathic change.   Electronically Signed   By: Lowella Grip III M.D.   On: 06/05/2015 10:39   Ct Head Wo Contrast  06/05/2015   CLINICAL DATA:  Pain following fall  EXAM: CT HEAD WITHOUT CONTRAST  CT CERVICAL SPINE WITHOUT CONTRAST  TECHNIQUE: Multidetector CT imaging of the head and cervical spine was performed following the standard protocol without intravenous contrast. Multiplanar CT image reconstructions of the cervical spine were also generated.  COMPARISON:  Cervical spine CT Mar 03, 2008; head CT May 25, 2014; brain MRI May 25, 2014  FINDINGS: CT HEAD FINDINGS  Moderate diffuse atrophy is stable. There is no intracranial mass, hemorrhage, extra-axial fluid collection, or midline shift.  There is stable small vessel disease throughout the centra semiovale bilaterally. No new gray-white compartment lesions are identified. No acute infarct is evident. The bony calvarium appears intact. The mastoid air cells are clear. There is a right frontal scalp hematoma.  CT CERVICAL SPINE FINDINGS  There is a stable apparent postsurgical defect involving the lamina of C6 on the left. There is no demonstrable acute fracture. There is 2 mm of anterolisthesis of C3 on C4. There is 3 mm of anterolisthesis of C4 on C5. No other spondylolisthesis is appreciable. Prevertebral soft tissues and predental space regions are normal. There is severe disc space narrowing at C5-6 and C6-7. There is moderate narrowing at C3-4 and mild narrowing at C4-5. There is a stable focus of concavity along the anterior superior aspect of the T2 vertebral body.  There is facet osteoarthritic change at multiple levels. No frank disc extrusion or stenosis. Thyroid appears normal. Lung apices are clear. There are foci of carotid artery calcification bilaterally, more on the left than on the right.  IMPRESSION: CT head: Atrophy with mild periventricular small vessel disease. No intracranial mass, hemorrhage, or extra-axial fluid collection. No acute infarct evident. Right frontal scalp hematoma present.  CT cervical spine: No acute fracture. Stable mild concavity along the superior anterior aspect of the T2 vertebral body, likely due to old trauma. Areas of mild spondylolisthesis are felt to be due to underlying spondylosis. There is multilevel arthropathy. There is an apparent postoperative defect in the lamina of C6 on the left. There are foci of carotid artery calcification, more on the left than on the right.   Electronically Signed   By: Lowella Grip III M.D.   On: 06/05/2015 10:25   Ct Cervical Spine Wo Contrast  06/05/2015   CLINICAL DATA:  Pain following fall  EXAM: CT HEAD WITHOUT CONTRAST  CT CERVICAL SPINE WITHOUT CONTRAST   TECHNIQUE: Multidetector CT imaging of the head and cervical spine was performed following the standard protocol without intravenous contrast. Multiplanar CT image reconstructions of the cervical spine were also generated.  COMPARISON:  Cervical spine CT Mar 03, 2008; head CT May 25, 2014; brain MRI May 25, 2014  FINDINGS: CT HEAD FINDINGS  Moderate diffuse atrophy is stable. There is no intracranial mass, hemorrhage, extra-axial fluid collection, or midline shift. There is stable small vessel disease throughout the centra semiovale bilaterally. No new gray-white compartment lesions are identified. No acute infarct is evident. The bony calvarium appears intact. The mastoid air cells are clear. There is a right frontal scalp hematoma.  CT CERVICAL SPINE FINDINGS  There is a stable apparent postsurgical defect involving the lamina of C6 on the left. There is no demonstrable acute fracture. There is 2 mm of anterolisthesis of C3 on C4. There is 3 mm of anterolisthesis of C4 on C5. No other spondylolisthesis is appreciable. Prevertebral soft tissues and predental space regions are normal. There is severe disc space narrowing at C5-6 and C6-7. There is moderate narrowing at C3-4 and mild narrowing at C4-5. There is a stable focus of concavity along the anterior superior aspect of the T2 vertebral body.  There is facet osteoarthritic change at multiple levels. No frank disc extrusion or stenosis. Thyroid appears normal. Lung apices are clear. There are foci of carotid artery calcification bilaterally, more on the left than on the right.  IMPRESSION: CT head: Atrophy with mild periventricular small vessel disease. No intracranial mass, hemorrhage, or extra-axial fluid collection. No acute infarct evident. Right frontal scalp hematoma present.  CT cervical spine: No acute fracture. Stable mild concavity along the superior anterior aspect of the T2 vertebral body, likely due to old trauma. Areas of mild spondylolisthesis  are felt to be due to underlying spondylosis. There is multilevel arthropathy. There is an  apparent postoperative defect in the lamina of C6 on the left. There are foci of carotid artery calcification, more on the left than on the right.   Electronically Signed   By: Lowella Grip III M.D.   On: 06/05/2015 10:25   Dg Humerus Right  06/05/2015   CLINICAL DATA:  [place] today at assisted living.  Pain.  EXAM: RIGHT HUMERUS - 2+ VIEW  COMPARISON:  Shoulder and elbow reported separately.  FINDINGS: There is a comminuted fracture involving much of the upper 1/2 of the humerus. The fracture begins across the humeral neck, and extends downward along the humeral shaft. There is moderate angulation.  IMPRESSION: Comminuted and angulated fracture involving much of the upper half humerus.   Electronically Signed   By: Staci Righter M.D.   On: 06/05/2015 10:43     EKG Interpretation None      MDM   Final diagnoses:  Fall  Humerus fracture, right, closed, initial encounter  Parkinson's disease  Antiplatelet or antithrombotic long-term use    In short this is an 81 year old female with history of CVA on Plavix and Parkinson's disease who presents with right shoulder pain after mechanical fall. She is nontoxic and in no acute distress on presentation. She had received 150 g of fentanyl prior to arrival was noted to have decreased SPO2 in the 80s requiring nasal cannula. When I saw this patient she was alert, oriented, neurologically intact. She had evidence of a small hematoma noted over her forehead as well as significant pain in her right upper arm with notable deformity. The remainder of her exam revealed no additional traumatic injuries. Given her antiplatelet usage, a CT of her head and cervical spine were performed. These imaging studies are visualized and revealed no acute intracranial or cervical spine injuries. X-ray of her right shoulder, humerus, elbows were obtained and visualized. There is  evidence of a comminuted extra-axial right proximal humerus fracture with angulation. Given the complex nature of this fracture I discussed this patient with orthopedic surgery, Dr Rhona Raider who recommended one week outpatient follow-up with sling immobilization.   On my reevaluation of this patient, she is able to be weaned off of nasal cannula on to room air with normal oxygenation. She is able to ambulate with assistance after a sling was applied to her right upper extremity. Husband was at bedside and reports that him and his son's eye can take care of her in her assisted living facility and that there are also certified nursing assistant and I will be able to assist this patient at home. Her primary care physician was also notified who will arrange this patient with a scooter at home as well as home physical therapy. I feel the patient has adequate resources at home and feels comfortable managing her injury from home. Strict return follow-up instructions were reviewed. She and her family express understanding of all discharge instructions and felt comfortable with the plan of care.  Forde Dandy, MD 06/05/15 [phone]

## 2015-06-05 NOTE — ED Notes (Signed)
After assuring that pt. B/p did not drop after Fentanyl administration; pt. Is d/c'd with her son and husband.

## 2015-06-05 NOTE — ED Notes (Signed)
Bed: MD47 Expected date:  Expected time:  Means of arrival:  Comments: EMS- 81yo F, fall, humeral fracture

## 2015-06-05 NOTE — Discharge Instructions (Signed)
Fall Prevention and Home Safety Falls cause injuries and can affect all age groups. It is possible to use preventive measures to significantly decrease the likelihood of falls. There are many simple measures which can make your home safer and prevent falls. OUTDOORS  Repair cracks and edges of walkways and driveways.  Remove high doorway thresholds.  Trim shrubbery on the main path into your home.  Have good outside lighting.  Clear walkways of tools, rocks, debris, and clutter.  Check that handrails are not broken and are securely fastened. Both sides of steps should have handrails.  Have leaves, snow, and ice cleared regularly.  Use sand or salt on walkways during winter months.  In the garage, clean up grease or oil spills. BATHROOM  Install night lights.  Install grab bars by the toilet and in the tub and shower.  Use non-skid mats or decals in the tub or shower.  Place a plastic non-slip stool in the shower to sit on, if needed.  Keep floors dry and clean up all water on the floor immediately.  Remove soap buildup in the tub or shower on a regular basis.  Secure bath mats with non-slip, double-sided rug tape.  Remove throw rugs and tripping hazards from the floors. BEDROOMS  Install night lights.  Make sure a bedside light is easy to reach.  Do not use oversized bedding.  Keep a telephone by your bedside.  Have a firm chair with side arms to use for getting dressed.  Remove throw rugs and tripping hazards from the floor. KITCHEN  Keep handles on pots and pans turned toward the center of the stove. Use back burners when possible.  Clean up spills quickly and allow time for drying.  Avoid walking on wet floors.  Avoid hot utensils and knives.  Position shelves so they are not too high or low.  Place commonly used objects within easy reach.  If necessary, use a sturdy step stool with a grab bar when reaching.  Keep electrical cables out of the  way.  Do not use floor polish or wax that makes floors slippery. If you must use wax, use non-skid floor wax.  Remove throw rugs and tripping hazards from the floor. STAIRWAYS  Never leave objects on stairs.  Place handrails on both sides of stairways and use them. Fix any loose handrails. Make sure handrails on both sides of the stairways are as long as the stairs.  Check carpeting to make sure it is firmly attached along stairs. Make repairs to worn or loose carpet promptly.  Avoid placing throw rugs at the top or bottom of stairways, or properly secure the rug with carpet tape to prevent slippage. Get rid of throw rugs, if possible.  Have an electrician put in a light switch at the top and bottom of the stairs. OTHER FALL PREVENTION TIPS  Wear low-heel or rubber-soled shoes that are supportive and fit well. Wear closed toe shoes.  When using a stepladder, make sure it is fully opened and both spreaders are firmly locked. Do not climb a closed stepladder.  Add color or contrast paint or tape to grab bars and handrails in your home. Place contrasting color strips on first and last steps.  Learn and use mobility aids as needed. Install an electrical emergency response system.  Turn on lights to avoid dark areas. Replace light bulbs that burn out immediately. Get light switches that glow.  Arrange furniture to create clear pathways. Keep furniture in the same place.  Firmly attach carpet with non-skid or double-sided tape.  Eliminate uneven floor surfaces.  Select a carpet pattern that does not visually hide the edge of steps.  Be aware of all pets. OTHER HOME SAFETY TIPS  Set the water temperature for 120 F (48.8 C).  Keep emergency numbers on or near the telephone.  Keep smoke detectors on every level of the home and near sleeping areas. Document Released: 10/09/2002 Document Revised: 04/19/2012 Document Reviewed: 01/08/2012 [facility] Patient Information 2015  [place], [place]. This information is not intended to replace advice given to you by your health care provider. Make sure you discuss any questions you have with your health care provider.  Humerus Fracture, Treated with Immobilization The humerus is the large bone in the upper arm. A broken (fractured) humerus is often treated by wearing a cast, splint, or sling (immobilization). This holds the broken pieces in place so they can heal.  HOME CARE  Put ice on the injured area.  Put ice in a plastic bag.  Place a towel between your skin and the bag.  Leave the ice on for 15-20 minutes, 03-04 times a day.  If you are given a cast:  Do not scratch the skin under the cast.  Check the skin around the cast every day. You may put lotion on any red or sore areas.  Keep the cast dry and clean.  If you are given a splint:  Wear the splint as told.  Keep the splint clean and dry.  Loosen the elastic around the splint if your fingers become numb, cold, tingle, or turn blue.  If you are given a sling:  Wear the sling as told.  Do not put pressure on any part of the cast or splint until it is fully hardened.  The cast or splint must be protected with a plastic bag during bathing. Do not lower the cast or splint into water.  Only take medicine as told by your doctor.  Do exercises as told by your doctor.  Follow up as told by your doctor. GET HELP RIGHT AWAY IF:   Your skin or fingernails turn blue or gray.  Your arm feels cold or numb.  You have very bad pain in the injured arm.  You are having problems with the medicines you were given. MAKE SURE YOU:   Understand these instructions.  Will watch your condition.  Will get help right away if you are not doing well or get worse. Document Released: 04/06/2008 Document Revised: 01/11/2012 Document Reviewed: 12/03/2010 [facility] Patient Information 2015 [place], [place]. This information is not intended to replace advice given to  you by your health care provider. Make sure you discuss any questions you have with your health care provider.

## 2015-06-05 NOTE — ED Notes (Signed)
It was reported by staff at [facility]. That pt. "fell against a wall" while ambulating. She c/o pain at right upper arm, in which she arrived in a sling.  CMS intact all fingers bilat. She is drowsy, having received 190mcg of Fentanyl IV en route to hospital.

## 2015-06-05 NOTE — Progress Notes (Signed)
Orthopedic Tech Progress Note Patient Details:  [name] 1930/10/04 [id number]  Ortho Devices Type of Ortho Device: Arm sling   Cammer, Theodoro Parma 06/05/2015, 6:20 PM

## 2015-06-11 ENCOUNTER — Encounter: Payer: Self-pay | Admitting: *Deleted

## 2015-06-14 LAB — CUP PACEART REMOTE DEVICE CHECK: MDC IDC SESS DTM: 20160812153729

## 2015-06-19 ENCOUNTER — Encounter: Payer: Self-pay | Admitting: Internal Medicine

## 2015-06-21 ENCOUNTER — Ambulatory Visit (INDEPENDENT_AMBULATORY_CARE_PROVIDER_SITE_OTHER): Payer: Medicare Other | Admitting: *Deleted

## 2015-06-21 DIAGNOSIS — R42 Dizziness and giddiness: Secondary | ICD-10-CM | POA: Diagnosis not present

## 2015-06-21 DIAGNOSIS — R002 Palpitations: Secondary | ICD-10-CM

## 2015-06-21 DIAGNOSIS — G459 Transient cerebral ischemic attack, unspecified: Secondary | ICD-10-CM | POA: Diagnosis not present

## 2015-06-28 ENCOUNTER — Encounter: Payer: Self-pay | Admitting: *Deleted

## 2015-07-01 LAB — CUP PACEART REMOTE DEVICE CHECK: MDC IDC SESS DTM: 20160829115658

## 2015-07-01 NOTE — Progress Notes (Signed)
Carelink summary report received.  Battery status OK.  Normal device function.  No new symptom, tachy, brady, or pause episodes.  No new AF episodes, +ASA 325mg  and Plavix.  Monthly summary reports and ROV with SK PRN.

## 2015-07-11 ENCOUNTER — Ambulatory Visit: Payer: Medicare Other | Admitting: Neurology

## 2015-07-22 ENCOUNTER — Ambulatory Visit (INDEPENDENT_AMBULATORY_CARE_PROVIDER_SITE_OTHER): Payer: Medicare Other | Admitting: *Deleted

## 2015-07-22 ENCOUNTER — Encounter: Payer: Self-pay | Admitting: Internal Medicine

## 2015-07-22 DIAGNOSIS — R55 Syncope and collapse: Secondary | ICD-10-CM | POA: Diagnosis not present

## 2015-07-24 NOTE — Progress Notes (Signed)
Loop recorder 

## 2015-07-27 LAB — CUP PACEART REMOTE DEVICE CHECK: MDC IDC SESS DTM: 20160919223602

## 2015-07-27 NOTE — Progress Notes (Signed)
Carelink summary report received. Battery status OK. Normal device function. No new symptom episodes, tachy episodes, brady, or pause episodes. No new AF episodes. Monthly summary reports and ROV overdue with SK.

## 2015-07-31 ENCOUNTER — Encounter: Payer: Self-pay | Admitting: *Deleted

## 2015-08-07 ENCOUNTER — Encounter: Payer: Medicare Other | Admitting: Internal Medicine

## 2015-08-12 ENCOUNTER — Emergency Department (HOSPITAL_COMMUNITY): Payer: Medicare Other

## 2015-08-12 ENCOUNTER — Observation Stay (HOSPITAL_COMMUNITY)
Admission: EM | Admit: 2015-08-12 | Discharge: 2015-08-13 | Disposition: A | Discharge status: Home / Self Care | Payer: Medicare Other | Attending: Internal Medicine | Admitting: Internal Medicine

## 2015-08-12 ENCOUNTER — Encounter (HOSPITAL_COMMUNITY): Payer: Self-pay | Admitting: Emergency Medicine

## 2015-08-12 DIAGNOSIS — Z7982 Long term (current) use of aspirin: Secondary | ICD-10-CM | POA: Diagnosis not present

## 2015-08-12 DIAGNOSIS — K219 Gastro-esophageal reflux disease without esophagitis: Secondary | ICD-10-CM | POA: Diagnosis not present

## 2015-08-12 DIAGNOSIS — R040 Epistaxis: Secondary | ICD-10-CM | POA: Diagnosis present

## 2015-08-12 DIAGNOSIS — Z888 Allergy status to other drugs, medicaments and biological substances status: Secondary | ICD-10-CM | POA: Insufficient documentation

## 2015-08-12 DIAGNOSIS — I2510 Atherosclerotic heart disease of native coronary artery without angina pectoris: Secondary | ICD-10-CM | POA: Insufficient documentation

## 2015-08-12 DIAGNOSIS — F419 Anxiety disorder, unspecified: Secondary | ICD-10-CM | POA: Insufficient documentation

## 2015-08-12 DIAGNOSIS — R000 Tachycardia, unspecified: Secondary | ICD-10-CM | POA: Insufficient documentation

## 2015-08-12 DIAGNOSIS — I10 Essential (primary) hypertension: Secondary | ICD-10-CM | POA: Insufficient documentation

## 2015-08-12 DIAGNOSIS — F329 Major depressive disorder, single episode, unspecified: Secondary | ICD-10-CM | POA: Diagnosis not present

## 2015-08-12 DIAGNOSIS — Z7902 Long term (current) use of antithrombotics/antiplatelets: Secondary | ICD-10-CM | POA: Diagnosis not present

## 2015-08-12 DIAGNOSIS — J45909 Unspecified asthma, uncomplicated: Secondary | ICD-10-CM | POA: Diagnosis not present

## 2015-08-12 DIAGNOSIS — G20 Parkinson's disease: Secondary | ICD-10-CM | POA: Insufficient documentation

## 2015-08-12 DIAGNOSIS — H3530 Unspecified macular degeneration: Secondary | ICD-10-CM | POA: Diagnosis not present

## 2015-08-12 DIAGNOSIS — E785 Hyperlipidemia, unspecified: Secondary | ICD-10-CM | POA: Insufficient documentation

## 2015-08-12 DIAGNOSIS — Z882 Allergy status to sulfonamides status: Secondary | ICD-10-CM | POA: Insufficient documentation

## 2015-08-12 DIAGNOSIS — Z90710 Acquired absence of both cervix and uterus: Secondary | ICD-10-CM | POA: Diagnosis not present

## 2015-08-12 DIAGNOSIS — Z8673 Personal history of transient ischemic attack (TIA), and cerebral infarction without residual deficits: Secondary | ICD-10-CM | POA: Insufficient documentation

## 2015-08-12 DIAGNOSIS — R269 Unspecified abnormalities of gait and mobility: Secondary | ICD-10-CM | POA: Insufficient documentation

## 2015-08-12 DIAGNOSIS — E669 Obesity, unspecified: Secondary | ICD-10-CM | POA: Diagnosis not present

## 2015-08-12 LAB — URINE MICROSCOPIC-ADD ON

## 2015-08-12 LAB — CBC WITH DIFFERENTIAL/PLATELET
Basophils Absolute: 0.1 10*3/uL (ref 0.0–0.1)
Basophils Relative: 1 %
Eosinophils Absolute: 0.3 10*3/uL (ref 0.0–0.7)
Eosinophils Relative: 3 %
HEMATOCRIT: 37.6 % (ref 36.0–46.0)
HEMOGLOBIN: 12.1 g/dL (ref 12.0–15.0)
LYMPHS ABS: 3.1 10*3/uL (ref 0.7–4.0)
LYMPHS PCT: 34 %
MCH: 30 pg (ref 26.0–34.0)
MCHC: 32.2 g/dL (ref 30.0–36.0)
MCV: 93.3 fL (ref 78.0–100.0)
Monocytes Absolute: 0.7 10*3/uL (ref 0.1–1.0)
Monocytes Relative: 7 %
NEUTROS ABS: 5.1 10*3/uL (ref 1.7–7.7)
NEUTROS PCT: 55 %
PLATELETS: 418 10*3/uL — AB (ref 150–400)
RBC: 4.03 MIL/uL (ref 3.87–5.11)
RDW: 13.5 % (ref 11.5–15.5)
WBC: 9.1 10*3/uL (ref 4.0–10.5)

## 2015-08-12 LAB — URINALYSIS, ROUTINE W REFLEX MICROSCOPIC
BILIRUBIN URINE: NEGATIVE
Glucose, UA: NEGATIVE mg/dL
Ketones, ur: NEGATIVE mg/dL
Leukocytes, UA: NEGATIVE
NITRITE: POSITIVE — AB
PH: 6 (ref 5.0–8.0)
Protein, ur: NEGATIVE mg/dL
SPECIFIC GRAVITY, URINE: 1.018 (ref 1.005–1.030)
Urobilinogen, UA: 0.2 mg/dL (ref 0.0–1.0)

## 2015-08-12 LAB — BASIC METABOLIC PANEL
ANION GAP: 9 (ref 5–15)
BUN: 27 mg/dL — ABNORMAL HIGH (ref 6–20)
CHLORIDE: 105 mmol/L (ref 101–111)
CO2: 25 mmol/L (ref 22–32)
Calcium: 10.1 mg/dL (ref 8.9–10.3)
Creatinine, Ser: 1 mg/dL (ref 0.44–1.00)
GFR calc Af Amer: 59 mL/min — ABNORMAL LOW (ref >60)
GFR calc non Af Amer: 51 mL/min — ABNORMAL LOW (ref >60)
GLUCOSE: 136 mg/dL — AB (ref 65–99)
POTASSIUM: 3.8 mmol/L (ref 3.5–5.1)
Sodium: 139 mmol/L (ref 135–145)

## 2015-08-12 LAB — I-STAT TROPONIN, ED: Troponin i, poc: 0 ng/mL (ref 0.00–0.08)

## 2015-08-12 LAB — BRAIN NATRIURETIC PEPTIDE: B Natriuretic Peptide: 18.8 pg/mL (ref 0.0–100.0)

## 2015-08-12 LAB — D-DIMER, QUANTITATIVE (NOT AT ARMC): D DIMER QUANT: 0.54 ug{FEU}/mL — AB (ref 0.00–0.48)

## 2015-08-12 MED ORDER — NAPROXEN SODIUM 220 MG PO TABS: 220.0000 mg | ORAL_TABLET | Freq: Two times a day (BID) | ORAL | Status: DC | PRN

## 2015-08-12 MED ORDER — ALPRAZOLAM 0.5 MG PO TABS
0.5000 mg | ORAL_TABLET | Freq: Three times a day (TID) | ORAL | Status: DC
  Administered 2015-08-13 (×2): 0.5 mg via ORAL
  Filled 2015-08-12 (×2): qty 1

## 2015-08-12 MED ORDER — CLOPIDOGREL BISULFATE 75 MG PO TABS
75.0000 mg | ORAL_TABLET | Freq: Every day | ORAL | Status: DC
  Filled 2015-08-12: qty 1

## 2015-08-12 MED ORDER — FENOFIBRATE 160 MG PO TABS
160.0000 mg | ORAL_TABLET | Freq: Every day | ORAL | Status: DC
  Administered 2015-08-13: 160 mg via ORAL
  Filled 2015-08-12: qty 1

## 2015-08-12 MED ORDER — CARBIDOPA-LEVODOPA 25-250 MG PO TABS
1.0000 | ORAL_TABLET | Freq: Three times a day (TID) | ORAL | Status: DC
  Administered 2015-08-13 (×2): 1 via ORAL
  Filled 2015-08-12 (×3): qty 1

## 2015-08-12 MED ORDER — OXYMETAZOLINE HCL 0.05 % NA SOLN
1.0000 | Freq: Once | NASAL | Status: DC
  Filled 2015-08-12: qty 15

## 2015-08-12 MED ORDER — NAPROXEN 250 MG PO TABS
250.0000 mg | ORAL_TABLET | Freq: Two times a day (BID) | ORAL | Status: DC | PRN
  Filled 2015-08-12: qty 1

## 2015-08-12 MED ORDER — POLYETHYLENE GLYCOL 3350 17 G PO PACK
17.0000 g | PACK | Freq: Every day | ORAL | Status: DC | PRN
  Filled 2015-08-12: qty 1

## 2015-08-12 MED ORDER — DULOXETINE HCL 20 MG PO CPEP
20.0000 mg | ORAL_CAPSULE | Freq: Every day | ORAL | Status: DC
  Administered 2015-08-13: 20 mg via ORAL
  Filled 2015-08-12: qty 1

## 2015-08-12 MED ORDER — PANTOPRAZOLE SODIUM 40 MG PO TBEC
40.0000 mg | DELAYED_RELEASE_TABLET | Freq: Every day | ORAL | Status: DC
  Administered 2015-08-13: 40 mg via ORAL
  Filled 2015-08-12: qty 1

## 2015-08-12 MED ORDER — ASPIRIN EC 325 MG PO TBEC
325.0000 mg | DELAYED_RELEASE_TABLET | Freq: Every day | ORAL | Status: DC
  Administered 2015-08-13: 325 mg via ORAL
  Filled 2015-08-12: qty 1

## 2015-08-12 MED ORDER — ACETAMINOPHEN 325 MG PO TABS
650.0000 mg | ORAL_TABLET | ORAL | Status: DC | PRN
  Administered 2015-08-13 (×2): 650 mg via ORAL
  Filled 2015-08-12 (×2): qty 2

## 2015-08-12 MED ORDER — MONTELUKAST SODIUM 10 MG PO TABS
10.0000 mg | ORAL_TABLET | Freq: Every day | ORAL | Status: DC
  Administered 2015-08-13: 10 mg via ORAL
  Filled 2015-08-12: qty 1

## 2015-08-12 MED ORDER — SODIUM CHLORIDE 0.9 % IV BOLUS (SEPSIS)
1000.0000 mL | Freq: Once | INTRAVENOUS | Status: AC
  Administered 2015-08-12: 1000 mL via INTRAVENOUS

## 2015-08-12 MED ORDER — LOSARTAN POTASSIUM 25 MG PO TABS
25.0000 mg | ORAL_TABLET | Freq: Every day | ORAL | Status: DC
  Administered 2015-08-13: 25 mg via ORAL
  Filled 2015-08-12: qty 1

## 2015-08-12 MED ORDER — VITAMIN D3 25 MCG (1000 UNIT) PO TABS
5000.0000 [IU] | ORAL_TABLET | Freq: Every day | ORAL | Status: DC
  Administered 2015-08-13: 5000 [IU] via ORAL
  Filled 2015-08-12: qty 5

## 2015-08-12 MED ORDER — FLUTICASONE PROPIONATE 50 MCG/ACT NA SUSP
2.0000 | Freq: Every day | NASAL | Status: DC
  Filled 2015-08-12: qty 16

## 2015-08-12 MED ORDER — DICYCLOMINE HCL 20 MG PO TABS
20.0000 mg | ORAL_TABLET | Freq: Four times a day (QID) | ORAL | Status: DC | PRN
  Filled 2015-08-12: qty 1

## 2015-08-12 MED ORDER — GUAIFENESIN ER 600 MG PO TB12
600.0000 mg | ORAL_TABLET | Freq: Two times a day (BID) | ORAL | Status: DC
  Administered 2015-08-13: 600 mg via ORAL
  Filled 2015-08-12 (×2): qty 1

## 2015-08-12 MED ORDER — PROMETHAZINE HCL 25 MG RE SUPP
12.5000 mg | Freq: Four times a day (QID) | RECTAL | Status: DC | PRN
  Filled 2015-08-12: qty 1

## 2015-08-12 NOTE — ED Provider Notes (Signed)
CSN: [id number]     Arrival date & time 08/12/15  1350 History   First MD Initiated Contact with Patient 08/12/15 1506     Chief Complaint  Patient presents with  . Epistaxis     (Consider location/radiation/quality/duration/timing/severity/associated sxs/prior Treatment) HPI Comments: 81yo F w/ PMH including HTN, GERD, parkinson's disease, CVA who p/w epistaxis. Pt states that 40 min PTA just after eating lunch, she began having b/l epistaxis that continued despite holding pressure. Because it was so heavy, they called EMS and pt was given afrin en route. Pt had a similar episode many years ago. No trauma to nose or recent cough/cold symptoms. Currently, it has stopped bleeding. Pt also incidentally notes that she has had heart racing for the past month which is constant and associated with mild SOB with exertion. No fevers, vomiting, abdominal or chest pain, or recent illness. Normal urination. She thinks that she is having worsening exertional symptoms.  The history is provided by the patient.    Past Medical History  Diagnosis Date  . Parkinson disease ([place])   . Cerebrovascular disease     CVA '03  . TIA (transient ischemic attack)     Multiple  . Depression   . Dyslipidemia   . Glaucoma   . GERD (gastroesophageal reflux disease)   . Obesity   . Endometriosis   . Paralysis agitans ([place]) 08/31/2013  . Macular degeneration     bilateral  . Hypertension   . Asthma   . Anxiety   . Headache(784.0)   . Arthritis   . Abnormality of gait 02/05/2015   Past Surgical History  Procedure Laterality Date  . Gallbladder surgery    . Abdominal hysterectomy    . Tonsillectomy    . Cataract extraction, bilateral    . Pilonidal cyst resection    . Tilt table study  05-28-14    orthostatic hypertension  . Loop recorder implant  05-28-14    MDT LINQ implanted by Dr Caryl Comes for cryptogenic stroke/palpitations  . Tilt table study N/A 05/28/2014    Procedure: TILT TABLE STUDY;  Surgeon:  Deboraha Sprang, MD;  Location: [facility] CATH LAB;  Service: Cardiovascular;  Laterality: N/A;  . Loop recorder implant N/A 05/28/2014    Procedure: LOOP RECORDER IMPLANT;  Surgeon: Deboraha Sprang, MD;  Location: [facility] CATH LAB;  Service: Cardiovascular;  Laterality: N/A;   Family History  Problem Relation Age of Onset  . Arthritis Brother   . Skin cancer Brother   . Heart attack Mother    Social History  Substance Use Topics  . Smoking status: Never Smoker   . Smokeless tobacco: Never Used  . Alcohol Use: No   OB History    No data available     Review of Systems 10 Systems reviewed and are negative for acute change except as noted in the HPI.    Allergies  Comtan; Mirapex; and Sulfa antibiotics  Home Medications   Prior to Admission medications   Medication Sig Start Date End Date Taking? Authorizing Provider  acetaminophen (TYLENOL) 325 MG tablet Take 650 mg by mouth every 4 (four) hours as needed for moderate pain.   Yes Historical Provider, MD  ALPRAZolam Duanne Moron) 0.5 MG tablet Take 0.5 mg by mouth 3 (three) times daily.    Yes Historical Provider, MD  aspirin EC 325 MG tablet Take 325 mg by mouth daily.   Yes Historical Provider, MD  Calcium Carbonate Antacid (CALCIUM ANTACID EXTRA STRENGTH PO) Take 300-600 mg  by mouth daily as needed (for heartburn).   Yes Historical Provider, MD  carbidopa-levodopa (SINEMET IR) 25-250 MG per tablet Take 1 tablet by mouth 3 (three) times daily before meals. Patient takes at 7am, 12noon, and 5pm 08/16/13  Yes Historical Provider, MD  Cholecalciferol (VITAMIN D-3) 5000 UNITS TABS Take 5,000 Units by mouth daily.    Yes Historical Provider, MD  clopidogrel (PLAVIX) 75 MG tablet Take 75 mg by mouth at bedtime.    Yes Historical Provider, MD  dicyclomine (BENTYL) 20 MG tablet Take 20 mg by mouth 4 (four) times daily as needed (for IBS symptoms).    Yes Historical Provider, MD  DULoxetine (CYMBALTA) 20 MG capsule Take 20 mg by mouth daily.   Yes  Historical Provider, MD  fenofibrate 160 MG tablet Take 160 mg by mouth daily.   Yes Historical Provider, MD  fluticasone (FLONASE) 50 MCG/ACT nasal spray Place 2 sprays into the nose daily.   Yes Historical Provider, MD  guaiFENesin (MUCINEX) 600 MG 12 hr tablet Take 600 mg by mouth 2 (two) times daily.   Yes Historical Provider, MD  HYDROcodone-acetaminophen (NORCO/VICODIN) 5-325 MG per tablet Take 1 tablet by mouth every 6 (six) hours as needed for severe pain. Patient taking differently: Take 1 tablet by mouth 4 (four) times daily.  06/05/15  Yes Forde Dandy, MD  hydrocortisone cream (HYDROCORTISONE MAX ST) 1 % Apply 1 application topically 2 (two) times daily as needed for itching.   Yes Historical Provider, MD  losartan (COZAAR) 25 MG tablet Take 25 mg by mouth daily. 08/16/13  Yes Historical Provider, MD  montelukast (SINGULAIR) 10 MG tablet Take 10 mg by mouth daily. 08/16/13  Yes Historical Provider, MD  Multiple Vitamin (TAB-A-VITE) TABS Take 1 tablet by mouth daily.   Yes Historical Provider, MD  naproxen sodium (ANAPROX) 220 MG tablet Take 220 mg by mouth 2 (two) times daily as needed (for pain).    Yes Historical Provider, MD  pantoprazole (PROTONIX) 40 MG tablet Take 40 mg by mouth daily.   Yes Historical Provider, MD  polyethylene glycol (MIRALAX / GLYCOLAX) packet Take 17 g by mouth daily as needed for moderate constipation.    Yes Historical Provider, MD  promethazine (PHENERGAN) 12.5 MG suppository Place 12.5 mg rectally every 6 (six) hours as needed for nausea or vomiting.   Yes Historical Provider, MD  Propylhexedrine (BENZEDREX NA) Place 1 spray into the nose every 4 (four) hours as needed (nasal congestion).    Yes Historical Provider, MD  simethicone (MYLICON) 268 MG chewable tablet Chew 125 mg by mouth every 6 (six) hours as needed for flatulence.   Yes Historical Provider, MD  talc (ZEASORB) powder Apply 1 application topically 2 (two) times daily as needed (for irritation).    Yes Historical Provider, MD  travoprost, benzalkonium, (TRAVATAN) 0.004 % ophthalmic solution Place 1 drop into both eyes at bedtime.   Yes Historical Provider, MD   BP 170/80 mmHg  Pulse 88  Temp(Src) 98.1 F (36.7 C) (Oral)  Resp 21  SpO2 96% Physical Exam  Constitutional: She is oriented to person, place, and time. She appears well-developed and well-nourished. No distress.  Dried blood on shirt  HENT:  Head: Normocephalic and atraumatic.  Moist mucous membranes; dried blood b/l nares  Eyes: Conjunctivae are normal. Pupils are equal, round, and reactive to light.  Neck: Neck supple.  Cardiovascular: Regular rhythm and normal heart sounds.   No murmur heard. tachycardic  Pulmonary/Chest: Effort normal and breath  sounds normal. No respiratory distress.  Abdominal: Soft. Bowel sounds are normal. She exhibits no distension. There is no tenderness.  Musculoskeletal: She exhibits no edema.  R arm in sling  Neurological: She is alert and oriented to person, place, and time.  Fluent speech  Skin: Skin is warm and dry.  Psychiatric: She has a normal mood and affect. Judgment normal.  Nursing note and vitals reviewed.   ED Course  Procedures (including critical care time) Labs Review Labs Reviewed  BASIC METABOLIC PANEL - Abnormal; Notable for the following:    Glucose, Bld 136 (*)    BUN 27 (*)    GFR calc non Af Amer 51 (*)    GFR calc Af Amer 59 (*)    All other components within normal limits  CBC WITH DIFFERENTIAL/PLATELET - Abnormal; Notable for the following:    Platelets 418 (*)    All other components within normal limits  URINALYSIS, ROUTINE W REFLEX MICROSCOPIC (NOT AT [facility]) - Abnormal; Notable for the following:    Hgb urine dipstick LARGE (*)    Nitrite POSITIVE (*)    All other components within normal limits  D-DIMER, QUANTITATIVE (NOT AT [facility]) - Abnormal; Notable for the following:    D-Dimer, Quant 0.54 (*)    All other components within normal limits   URINE MICROSCOPIC-ADD ON - Abnormal; Notable for the following:    Bacteria, UA MANY (*)    All other components within normal limits  BRAIN NATRIURETIC PEPTIDE  I-STAT TROPOININ, ED    Imaging Review Dg Chest 2 View  08/12/2015   CLINICAL DATA:  Epistaxis, tachycardia.  EXAM: CHEST  2 VIEW  COMPARISON:  05/26/2014 and prior study  FINDINGS: The cardiomediastinal silhouette is unremarkable.  Mild right hemidiaphragm elevation is again noted.  There is no evidence of focal airspace disease, pulmonary edema, suspicious pulmonary nodule/mass, pleural effusion, or pneumothorax.  A right humeral neck fracture is identified of uncertain chronicity.  IMPRESSION: No evidence of acute cardiopulmonary disease.  Age indeterminate right humeral neck fracture.   Electronically Signed   By: Margarette Canada M.D.   On: 08/12/2015 17:44   I have personally reviewed and evaluated these lab results as part of my medical decision-making.   EKG Interpretation   Date/Time:  Monday August 12 2015 16:54:52 EDT Ventricular Rate:  124 PR Interval:  42 QRS Duration: 93 QT Interval:  430 QTC Calculation: 618 R Axis:   -106 Text Interpretation:  Ectopic atrial tachycardia, unifocal Consider  dextrocardia Baseline wander in lead(s) V6 no evidence of dextrocardia on  previous Since last tracing rate faster Confirmed by LITTLE MD, RACHEL  ([id number]) on 08/12/2015 5:00:17 PM      MDM   Final diagnoses:  Tachycardia  epistaxis, resolved   81yo F who p/w episode of epistaxis that began earlier today and incidentally noted tachycardia for a month. Pt awake, alert, NAD at presentation. No active bleeding from nose. VS notable for tachycardia at 116. EKG showed sinus tachycardia w/ no evidence of ischemia. Regarding epistaxis, it has been hemostatic for 1.5 hr and no further management required at this time. Instructed on nasal saline and nose blowing precautions.   Obtained labs and CXR to evaluate tachycardia.  Orthostatics notable for significant increase in HR with standing. Gave pt IVF bolus although no evidence of dehydration on exam. Labs show normal CBC and no electrolyte abnormalities. Obtained d-dimer to r/o PE; d-dimer if age adjusted is wnl. CXR no acute findings. UA is  pending. After fluid bolus, pt continues to have significant tachycardia with standing and any movement. On re-examination, she admits that her symptoms have been going on for a few months and she is having progressive exertional tolerance. I am concerned about her ongoing symptoms with no obvious arhythmia or metabolic derangement to explain sx. Discussed w/ patient and she has been admitted to general medicine for further workup.    Sharlett Iles, MD 08/13/15 [phone]

## 2015-08-12 NOTE — ED Notes (Signed)
Patient ambulated to restroom and back to stretcher. Pt tolerated it well with assistance.

## 2015-08-12 NOTE — ED Notes (Signed)
Per EMS, pt c/o heavy epistaxis with clots onset 40 minutes ago whilst eating lunch. EMS administered 2 sprays of Afrin to right nare and 1 spray Afrin to left nare, currently pt is bleeding through right nare only with clots. Pt reports Hx of the same many years ago.

## 2015-08-12 NOTE — ED Notes (Signed)
Bed: [facility] Expected date:  Expected time:  Means of arrival:  Comments: EMS- 81yo F, epistaxis

## 2015-08-12 NOTE — ED Notes (Signed)
Bed: WA19 Expected date:  Expected time:  Means of arrival:  Comments: [facility] 

## 2015-08-12 NOTE — ED Notes (Signed)
Pt ambulated to the restroom in attempt to collect a urine sample, however unsuccessful

## 2015-08-12 NOTE — ED Notes (Signed)
Patient transported to X-ray 

## 2015-08-12 NOTE — ED Notes (Signed)
Pt has decided to stay for admission. Pt's son spoke with provider and feels like patient needs to be admitted. Explained about the admission process.

## 2015-08-12 NOTE — ED Notes (Signed)
Provided patient a pillow for comfort to her left arm. IV fluids continue to infuse.

## 2015-08-12 NOTE — ED Notes (Signed)
Pt's son, AVYANA, PUFFENBARGER phone number: [phone].

## 2015-08-12 NOTE — H&P (Signed)
History and Physical  [name] NWG:[id number] DOB: October 14, 1930 DOA: 08/12/2015  PCP: Reymundo Poll, MD   Chief Complaint: epistaxis  History of Present Illness:  Patient is a 81 year old female with history of PD, HTN,TIA who presented initially with epistaxis that was completely resolved after getting afrin. Then she became tachycardic on standing up with mild dizziness per ER. Patient denies any symptoms.She has an implantable loop recorder from July 2015.   Review of Systems:  CONSTITUTIONAL:  No night sweats.  No fatigue, malaise, lethargy.  No fever or chills. Eyes:  No visual changes.  No eye pain.  No eye discharge.   ENT:    No epistaxis.  No sinus pain.  No sore throat.  No ear pain.  No congestion. RESPIRATORY:  No cough.  No wheeze.  No hemoptysis.  No shortness of breath. CARDIOVASCULAR:  No chest pains.  No palpitations. GASTROINTESTINAL:  No abdominal pain.  No nausea or vomiting.  No diarrhea or constipation.  No hematemesis.  No hematochezia.  No melena. GENITOURINARY:  No urgency.  No frequency.  No dysuria.  No hematuria.  No obstructive symptoms.  No discharge.  No pain.  No significant abnormal bleeding. MUSCULOSKELETAL:  No musculoskeletal pain.  No joint swelling.  No arthritis. NEUROLOGICAL:  No confusion.  No weakness. No headache. No seizure. PSYCHIATRIC:  No depression. No anxiety. No suicidal ideation. SKIN:  No rashes.  No lesions.  No wounds. ENDOCRINE:  No unexplained weight loss.  No polydipsia.  No polyuria.  No polyphagia. HEMATOLOGIC:  No anemia.  No purpura.  No petechiae.  No bleeding.  ALLERGIC AND IMMUNOLOGIC:  No pruritus.  No swelling Other:  Past Medical and Surgical History:   Past Medical History  Diagnosis Date  . Parkinson disease ([place])   . Cerebrovascular disease     CVA '03  . TIA (transient ischemic attack)     Multiple  . Depression   . Dyslipidemia   . Glaucoma   . GERD (gastroesophageal reflux disease)   .  Obesity   . Endometriosis   . Paralysis agitans ([place]) 08/31/2013  . Macular degeneration     bilateral  . Hypertension   . Asthma   . Anxiety   . Headache(784.0)   . Arthritis   . Abnormality of gait 02/05/2015   Past Surgical History  Procedure Laterality Date  . Gallbladder surgery    . Abdominal hysterectomy    . Tonsillectomy    . Cataract extraction, bilateral    . Pilonidal cyst resection    . Tilt table study  05-28-14    orthostatic hypertension  . Loop recorder implant  05-28-14    MDT LINQ implanted by Dr Caryl Comes for cryptogenic stroke/palpitations  . Tilt table study N/A 05/28/2014    Procedure: TILT TABLE STUDY;  Surgeon: Deboraha Sprang, MD;  Location: [facility] CATH LAB;  Service: Cardiovascular;  Laterality: N/A;  . Loop recorder implant N/A 05/28/2014    Procedure: LOOP RECORDER IMPLANT;  Surgeon: Deboraha Sprang, MD;  Location: [facility] CATH LAB;  Service: Cardiovascular;  Laterality: N/A;    Social History:   reports that she has never smoked. She has never used smokeless tobacco. She reports that she does not drink alcohol or use illicit drugs.  Allergies  Allergen Reactions  . Comtan [Entacapone]   . Mirapex [Pramipexole Dihydrochloride]   . Sulfa Antibiotics Nausea And Vomiting    Family History  Problem Relation Age of Onset  . Arthritis  Brother   . Skin cancer Brother   . Heart attack Mother      Prior to Admission medications   Medication Sig Start Date End Date Taking? Authorizing Provider  acetaminophen (TYLENOL) 325 MG tablet Take 650 mg by mouth every 4 (four) hours as needed for moderate pain.   Yes Historical Provider, MD  ALPRAZolam Duanne Moron) 0.5 MG tablet Take 0.5 mg by mouth 3 (three) times daily.    Yes Historical Provider, MD  aspirin EC 325 MG tablet Take 325 mg by mouth daily.   Yes Historical Provider, MD  Calcium Carbonate Antacid (CALCIUM ANTACID EXTRA STRENGTH PO) Take 300-600 mg by mouth daily as needed (for heartburn).   Yes Historical Provider, MD   carbidopa-levodopa (SINEMET IR) 25-250 MG per tablet Take 1 tablet by mouth 3 (three) times daily before meals. Patient takes at 7am, 12noon, and 5pm 08/16/13  Yes Historical Provider, MD  Cholecalciferol (VITAMIN D-3) 5000 UNITS TABS Take 5,000 Units by mouth daily.    Yes Historical Provider, MD  clopidogrel (PLAVIX) 75 MG tablet Take 75 mg by mouth at bedtime.    Yes Historical Provider, MD  dicyclomine (BENTYL) 20 MG tablet Take 20 mg by mouth 4 (four) times daily as needed (for IBS symptoms).    Yes Historical Provider, MD  DULoxetine (CYMBALTA) 20 MG capsule Take 20 mg by mouth daily.   Yes Historical Provider, MD  fenofibrate 160 MG tablet Take 160 mg by mouth daily.   Yes Historical Provider, MD  fluticasone (FLONASE) 50 MCG/ACT nasal spray Place 2 sprays into the nose daily.   Yes Historical Provider, MD  guaiFENesin (MUCINEX) 600 MG 12 hr tablet Take 600 mg by mouth 2 (two) times daily.   Yes Historical Provider, MD  HYDROcodone-acetaminophen (NORCO/VICODIN) 5-325 MG per tablet Take 1 tablet by mouth every 6 (six) hours as needed for severe pain. Patient taking differently: Take 1 tablet by mouth 4 (four) times daily.  06/05/15  Yes Forde Dandy, MD  hydrocortisone cream (HYDROCORTISONE MAX ST) 1 % Apply 1 application topically 2 (two) times daily as needed for itching.   Yes Historical Provider, MD  losartan (COZAAR) 25 MG tablet Take 25 mg by mouth daily. 08/16/13  Yes Historical Provider, MD  montelukast (SINGULAIR) 10 MG tablet Take 10 mg by mouth daily. 08/16/13  Yes Historical Provider, MD  Multiple Vitamin (TAB-A-VITE) TABS Take 1 tablet by mouth daily.   Yes Historical Provider, MD  naproxen sodium (ANAPROX) 220 MG tablet Take 220 mg by mouth 2 (two) times daily as needed (for pain).    Yes Historical Provider, MD  pantoprazole (PROTONIX) 40 MG tablet Take 40 mg by mouth daily.   Yes Historical Provider, MD  polyethylene glycol (MIRALAX / GLYCOLAX) packet Take 17 g by mouth daily as  needed for moderate constipation.    Yes Historical Provider, MD  promethazine (PHENERGAN) 12.5 MG suppository Place 12.5 mg rectally every 6 (six) hours as needed for nausea or vomiting.   Yes Historical Provider, MD  Propylhexedrine (BENZEDREX NA) Place 1 spray into the nose every 4 (four) hours as needed (nasal congestion).    Yes Historical Provider, MD  simethicone (MYLICON) 811 MG chewable tablet Chew 125 mg by mouth every 6 (six) hours as needed for flatulence.   Yes Historical Provider, MD  talc (ZEASORB) powder Apply 1 application topically 2 (two) times daily as needed (for irritation).   Yes Historical Provider, MD  travoprost, benzalkonium, (TRAVATAN) 0.004 % ophthalmic solution  Place 1 drop into both eyes at bedtime.   Yes Historical Provider, MD    Physical Exam: BP 184/74 mmHg  Pulse 94  Temp(Src) 98.1 F (36.7 C) (Oral)  Resp 16  SpO2 96%  GENERAL : Well developed, well nourished, alert and cooperative, and appears to be in no acute distress. HEAD: normocephalic. EYES: PERRL, EOMI.  EARS:  hearing grossly intact. NOSE: No nasal discharge. THROAT: Oral cavity and pharynx normal.  NECK: Neck supple. CARDIAC: Normal S1 and S2. No S3, S4 or murmurs. Rhythm is regular. LUNGS: Clear to auscultation. ABDOMEN: Positive bowel sounds. Soft, nondistended, nontender. No guarding or rebound. No masses. NEUROLOGICAL: The mental examination revealed the patient was oriented to person, place, and time.CN II-XII intact. Strength and sensation symmetric and intact throughout. Reflexes 2+ throughout. Cerebellar testing normal. SKIN: Skin normal color, texture and turgor with no lesions or eruptions. PSYCHIATRIC:  The patient was able to demonstrate good judgement and reason, without hallucinations, abnormal affect or abnormal behaviors during the examination. Patient is not suicidal.          Labs on Admission:  Reviewed.   Radiological Exams on Admission: Dg Chest 2  View  08/12/2015   CLINICAL DATA:  Epistaxis, tachycardia.  EXAM: CHEST  2 VIEW  COMPARISON:  05/26/2014 and prior study  FINDINGS: The cardiomediastinal silhouette is unremarkable.  Mild right hemidiaphragm elevation is again noted.  There is no evidence of focal airspace disease, pulmonary edema, suspicious pulmonary nodule/mass, pleural effusion, or pneumothorax.  A right humeral neck fracture is identified of uncertain chronicity.  IMPRESSION: No evidence of acute cardiopulmonary disease.  Age indeterminate right humeral neck fracture.   Electronically Signed   By: Margarette Canada M.D.   On: 08/12/2015 17:44    EKG:  Independently reviewed. PACs.  Assessment/Plan  Tachycardia: Sinus with PACs Patient to stay on tele overnight  Since she has a loop recorder,she likely can go home with outpatient f/u but she has no ride!   PD: continue home medication.  DVT prophylaxis: SCDs. Code Status: Full code Disposition Plan: DC home in am.    Gennaro Africa M.D [facility]

## 2015-08-13 ENCOUNTER — Encounter: Payer: Self-pay | Admitting: Internal Medicine

## 2015-08-13 ENCOUNTER — Encounter (HOSPITAL_COMMUNITY): Payer: Self-pay | Admitting: *Deleted

## 2015-08-13 DIAGNOSIS — G20 Parkinson's disease: Secondary | ICD-10-CM

## 2015-08-13 DIAGNOSIS — R040 Epistaxis: Secondary | ICD-10-CM

## 2015-08-13 DIAGNOSIS — I2510 Atherosclerotic heart disease of native coronary artery without angina pectoris: Secondary | ICD-10-CM

## 2015-08-13 DIAGNOSIS — R42 Dizziness and giddiness: Secondary | ICD-10-CM | POA: Diagnosis not present

## 2015-08-13 DIAGNOSIS — R000 Tachycardia, unspecified: Secondary | ICD-10-CM | POA: Diagnosis not present

## 2015-08-13 MED ORDER — OXYMETAZOLINE HCL 0.05 % NA SOLN: 1.0000 | Freq: Once | NASAL | Status: DC

## 2015-08-13 NOTE — Progress Notes (Signed)
Reviewed discharge information with patient and sister in law. Answered all questions. Patient/caregiver able to teach back medications and reasons to contact MD/911. PCP f/u appt set with Dr[facility]Haber at [facility].  Barbee Shropshire. Brigitte Pulse, RN

## 2015-08-13 NOTE — Discharge Instructions (Signed)

## 2015-08-13 NOTE — Care Management Note (Signed)
Case Management Note  Patient Details  Name: BENTLEIGH WAREN MRN: [id number] Date of Birth: [birth year]  Subjective/Objective:   81 y/o f admitted w/Tachycardia.[place] ordered.TC [facility]-spoke to [facility] have their own [place].[facility].Will fax [place] orders to #[phone].PT cons-await recc.CSW following.                 Action/Plan:d/c plan HHC.   Expected Discharge Date:                  Expected Discharge Plan:  Assisted Living / Rest Home  In-House Referral:  Clinical Social Work  Discharge planning Services  CM Consult  Post Acute Care Choice:    Choice offered to:     DME Arranged:    DME Agency:     HH Arranged:  RN, PT, OT HH Agency:   [facility] Technology Officer for [facility]  Status of Service:  In process, will continue to follow  Medicare Important Message Given:    Date Medicare IM Given:    Medicare IM give by:    Date Additional Medicare IM Given:    Additional Medicare Important Message give by:     If discussed at [place] of Stay Meetings, dates discussed:    Additional Comments:  Dessa Phi, RN 08/13/2015, 11:47 AM

## 2015-08-13 NOTE — Evaluation (Signed)
Physical Therapy Evaluation Patient Details Name: [name] MRN: [id number] DOB: 1930-08-03 Today's Date: 08/13/2015   History of Present Illness  81 yo female admitted with tachycardia. Hx of recent R shoulder fx 06/2014-follow up with MD 09/02/15, TIA, HTN, Parkinson's disease, macular degeneration, CVA  Clinical Impression  On eval, pt was Min guard assist for mobility-walked ~20 feet in room with quad cane. Pt tolerated activity well. Performed well with quad cane this session.No LOB during session. Pt denied dizziness/lightheadedness.Pt reports she has been managing fairly well, at ALF since fall/shoulder injury in 06/2015, with help from family (husband, sister n law) mostly and small amount of assist from ALF staff. Pt has been using wheelchair (husband pushing) for long distances. Pt has been walking short distance in room by holding on to furniture, grab bars. Discussed use of quad cane but pt declines this. She states MD doesn't want her using device (feels this "will cause her to trip and fall"). Pt reports she has participated in and completed HHPT at ALF since fall/injury. Pt should be able to return to ALF with continued assist from family and ALF staff.     Follow Up Recommendations No PT follow up; 24 hour supervision/assist    Equipment Recommendations  None recommended by PT    Recommendations for Other Services       Precautions / Restrictions Precautions Precautions: Fall Precaution Comments: NWB R UE Required Braces or Orthoses: Sling (R UE) Restrictions Weight Bearing Restrictions: Yes RUE Weight Bearing: Non weight bearing      Mobility  Bed Mobility Overal bed mobility: Needs Assistance Bed Mobility: Supine to Sit     Supine to sit: HOB elevated;Min guard     General bed mobility comments: close guard for safety. Increased time. Pt's bed is adjustable at ALF so elevated HOB  Transfers Overall transfer level: Needs assistance Equipment used:  Quad cane Transfers: Sit to/from Stand Sit to Stand: Min guard         General transfer comment: close guard for safety. VCs safety, technique, hand placement.   Ambulation/Gait Ambulation/Gait assistance: Min guard Ambulation Distance (Feet): 20 Feet Assistive device: Quad cane Gait Pattern/deviations: Step-to pattern     General Gait Details: close guard for safety. Pt did well with quad cane however she does not wish to use it (MD doesn't want her to use quad cane per pt).   Stairs            Wheelchair Mobility    Modified Rankin (Stroke Patients Only)       Balance Overall balance assessment: History of Falls;Needs assistance         Standing balance support: During functional activity Standing balance-Leahy Scale: Fair                               Pertinent Vitals/Pain Pain Assessment: No/denies pain    Home Living Family/patient expects to be discharged to:: Assisted living Living Arrangements: Spouse/significant other   Type of Home: Assisted living [facility]) Home Access: Level entry     Home Layout: One level Home Equipment: Environmental consultant - 4 wheels      Prior Function Level of Independence: Needs assistance   Gait / Transfers Assistance Needed: husband has been wheeling pt in wheelchair for long distances. Pt states she holds onto door/grabbars to walk into bathroom.   ADL's / Homemaking Assistance Needed: sister n law has been helping with ADLS  2x/week  Comments: At baseline (prior to fall) pt used RW for ambulation.      Hand Dominance        Extremity/Trunk Assessment   Upper Extremity Assessment: RUE deficits/detail RUE Deficits / Details: sling R UE RUE: Unable to fully assess due to immobilization       Lower Extremity Assessment: Generalized weakness      Cervical / Trunk Assessment: Kyphotic  Communication   Communication: No difficulties  Cognition Arousal/Alertness: Awake/alert Behavior During  Therapy: WFL for tasks assessed/performed Overall Cognitive Status: Within Functional Limits for tasks assessed                      General Comments      Exercises        Assessment/Plan    PT Assessment Patent does not need any further PT services  PT Diagnosis     PT Problem List    PT Treatment Interventions     PT Goals (Current goals can be found in the Care Plan section) Acute Rehab PT Goals Patient Stated Goal: return to ALF PT Goal Formulation: All assessment and education complete, DC therapy    Frequency     Barriers to discharge        Co-evaluation               End of Session Equipment Utilized During Treatment: Gait belt Activity Tolerance: Patient tolerated treatment well Patient left: in chair;with call bell/phone within reach;with nursing/sitter in room      Functional Assessment Tool Used: clinical judgement Functional Limitation: Mobility: Walking and moving around Mobility: Walking and Moving Around Current Status (J6283): At least 1 percent but less than 20 percent impaired, limited or restricted Mobility: Walking and Moving Around Goal Status [phone]): At least 1 percent but less than 20 percent impaired, limited or restricted Mobility: Walking and Moving Around Discharge Status [phone]): At least 1 percent but less than 20 percent impaired, limited or restricted    Time: 7106-2694 PT Time Calculation (min) (ACUTE ONLY): 21 min   Charges:   PT Evaluation $Initial PT Evaluation Tier I: 1 Procedure     PT G Codes:   PT G-Codes **NOT FOR INPATIENT CLASS** Functional Assessment Tool Used: clinical judgement Functional Limitation: Mobility: Walking and moving around Mobility: Walking and Moving Around Current Status (W5462): At least 1 percent but less than 20 percent impaired, limited or restricted Mobility: Walking and Moving Around Goal Status [phone]): At least 1 percent but less than 20 percent impaired, limited or  restricted Mobility: Walking and Moving Around Discharge Status [phone]): At least 1 percent but less than 20 percent impaired, limited or restricted    Weston Anna, MPT Pager: [phone]

## 2015-08-13 NOTE — ED Notes (Signed)
Pt refused her Mucinex. She stated she only wanted something for her to sleep and pain.

## 2015-08-13 NOTE — Discharge Summary (Signed)
Physician Discharge Summary  [name] XTK:[id number] DOB: 1930-09-18 DOA: 08/12/2015  PCP: Reymundo Poll, MD  Admit date: 08/12/2015 Discharge date: 08/13/2015  Recommendations for Outpatient Follow-up:  1. Patient advised to stop using aspirin if there is any sign of bleed.  Discharge Diagnoses:  Active Problems:   Tachycardia    Discharge Condition: stable   Diet recommendation: as tolerated   History of present illness:  81 year old female with past medical history of hypertension, anxiety and depression, Parkinson's disease, dyslipidemia who presented with nosebleeds that has completely resolved with Afrin nasal spray. Hospital course complicated with development of tachycardia while standing up and mild dizziness. Patient has implantable loop recorder since July 2012.  Hospital Course:   Principal problem: Epistaxis - Probably triggered by aspirin and Plavix although it has spontaneously resolved with Afrin spray. - Patient may resume aspirin. Hemoglobin is 12.1. She was instructed to stop aspirin if she notices any sign of bleed.  Active Problems:   Tachycardia / dizziness - Likely from epistaxis versus high blood pressure versus Parkinson's disease - Current heart rate 106. - Patient instructed to follow-up with primary care physician who can make a recommendation for adding medication for heart rate control. Currently patient is asymptomatic. - We will obtain physical therapy prior to discharge. Home health orders for PT, OT and RN all placed.  Parkinson's disease - Continue Sinemet as per prior to this admission  Coronary artery disease - May continue aspirin and Plavix with instruction to stop it if there is any sign of bleed    Signed:  Leisa Lenz, MD  [facility] 08/13/2015, 9:43 AM  Pager #: [phone]  Time spent in minutes: more than 30 minutes  Procedures:  None   Consultations:  PT   Discharge Exam: Filed Vitals:   08/13/15 0550  BP: 174/81  Pulse: 106  Temp: 97.7 F (36.5 C)  Resp: 20   Filed Vitals:   08/13/15 0031 08/13/15 0058 08/13/15 0100 08/13/15 0550  BP: 139/68  178/86 174/81  Pulse: 96  98 106  Temp:   97.8 F (36.6 C) 97.7 F (36.5 C)  TempSrc:   Oral Oral  Resp: 15  20 20   Height:  5' (1.524 m)    Weight:  90.9 kg (200 lb 6.4 oz)    SpO2: 95% 96% 96% 98%    General: Pt is alert, follows commands appropriately, not in acute distress Cardiovascular: Regular rate and rhythm, S1/S2 +, no murmurs Respiratory: Clear to auscultation bilaterally, no wheezing, no crackles, no rhonchi Abdominal: Soft, non tender, non distended, bowel sounds +, no guarding Extremities: no edema, no cyanosis, pulses palpable bilaterally DP and PT Neuro: Grossly nonfocal  Discharge Instructions  Discharge Instructions    Call MD for:  difficulty breathing, headache or visual disturbances    Complete by:  As directed      Call MD for:  persistant dizziness or light-headedness    Complete by:  As directed      Call MD for:  persistant nausea and vomiting    Complete by:  As directed      Call MD for:  severe uncontrolled pain    Complete by:  As directed      Diet - low sodium heart healthy    Complete by:  As directed      Discharge instructions    Complete by:  As directed   1. Please stop aspirin if you notice any signs of bleed.  Increase activity slowly    Complete by:  As directed             Medication List    STOP taking these medications        naproxen sodium 220 MG tablet  Commonly known as:  ANAPROX      TAKE these medications        acetaminophen 325 MG tablet  Commonly known as:  TYLENOL  Take 650 mg by mouth every 4 (four) hours as needed for moderate pain.     ALPRAZolam 0.5 MG tablet  Commonly known as:  XANAX  Take 0.5 mg by mouth 3 (three) times daily.     aspirin EC 325 MG tablet  Take 325 mg by mouth daily.     BENZEDREX NA  Place 1 spray into the nose  every 4 (four) hours as needed (nasal congestion).     CALCIUM ANTACID EXTRA STRENGTH PO  Take 300-600 mg by mouth daily as needed (for heartburn).     carbidopa-levodopa 25-250 MG tablet  Commonly known as:  SINEMET IR  Take 1 tablet by mouth 3 (three) times daily before meals. Patient takes at 7am, 12noon, and 5pm     clopidogrel 75 MG tablet  Commonly known as:  PLAVIX  Take 75 mg by mouth at bedtime.     dicyclomine 20 MG tablet  Commonly known as:  BENTYL  Take 20 mg by mouth 4 (four) times daily as needed (for IBS symptoms).     DULoxetine 20 MG capsule  Commonly known as:  CYMBALTA  Take 20 mg by mouth daily.     fenofibrate 160 MG tablet  Take 160 mg by mouth daily.     fluticasone 50 MCG/ACT nasal spray  Commonly known as:  FLONASE  Place 2 sprays into the nose daily.     guaiFENesin 600 MG 12 hr tablet  Commonly known as:  MUCINEX  Take 600 mg by mouth 2 (two) times daily.     HYDROcodone-acetaminophen 5-325 MG tablet  Commonly known as:  NORCO/VICODIN  Take 1 tablet by mouth every 6 (six) hours as needed for severe pain.     HYDROCORTISONE MAX ST 1 %  Generic drug:  hydrocortisone cream  Apply 1 application topically 2 (two) times daily as needed for itching.     losartan 25 MG tablet  Commonly known as:  COZAAR  Take 25 mg by mouth daily.     montelukast 10 MG tablet  Commonly known as:  SINGULAIR  Take 10 mg by mouth daily.     oxymetazoline 0.05 % nasal spray  Commonly known as:  AFRIN  Place 1 spray into both nostrils once.     pantoprazole 40 MG tablet  Commonly known as:  PROTONIX  Take 40 mg by mouth daily.     polyethylene glycol packet  Commonly known as:  MIRALAX / GLYCOLAX  Take 17 g by mouth daily as needed for moderate constipation.     promethazine 12.5 MG suppository  Commonly known as:  PHENERGAN  Place 12.5 mg rectally every 6 (six) hours as needed for nausea or vomiting.     simethicone 125 MG chewable tablet  Commonly  known as:  MYLICON  Chew 619 mg by mouth every 6 (six) hours as needed for flatulence.     TAB-A-VITE Tabs  Take 1 tablet by mouth daily.     travoprost (benzalkonium) 0.004 % ophthalmic solution  Commonly known as:  TRAVATAN  Place 1 drop into both eyes at bedtime.     Vitamin D-3 5000 UNITS Tabs  Take 5,000 Units by mouth daily.     ZEASORB powder  Generic drug:  talc  Apply 1 application topically 2 (two) times daily as needed (for irritation).           Follow-up Information    Follow up with Reymundo Poll, MD. Schedule an appointment as soon as possible for a visit in 1 week.   Specialty:  Family Medicine   Why:  Follow up appt after recent hospitalization   Contact information:   [address] [place] [place] [address] [phone]        The results of significant diagnostics from this hospitalization (including imaging, microbiology, ancillary and laboratory) are listed below for reference.    Significant Diagnostic Studies: Dg Chest 2 View  08/12/2015   CLINICAL DATA:  Epistaxis, tachycardia.  EXAM: CHEST  2 VIEW  COMPARISON:  05/26/2014 and prior study  FINDINGS: The cardiomediastinal silhouette is unremarkable.  Mild right hemidiaphragm elevation is again noted.  There is no evidence of focal airspace disease, pulmonary edema, suspicious pulmonary nodule/mass, pleural effusion, or pneumothorax.  A right humeral neck fracture is identified of uncertain chronicity.  IMPRESSION: No evidence of acute cardiopulmonary disease.  Age indeterminate right humeral neck fracture.   Electronically Signed   By: Margarette Canada M.D.   On: 08/12/2015 17:44    Microbiology: No results found for this or any previous visit (from the past 240 hour(s)).   Labs: Basic Metabolic Panel:  Recent Labs Lab 08/12/15 1713  NA 139  K 3.8  CL 105  CO2 25  GLUCOSE 136*  BUN 27*  CREATININE 1.00  CALCIUM 10.1   Liver Function Tests: No results for input(s): AST, ALT,  ALKPHOS, BILITOT, PROT, ALBUMIN in the last 168 hours. No results for input(s): LIPASE, AMYLASE in the last 168 hours. No results for input(s): AMMONIA in the last 168 hours. CBC:  Recent Labs Lab 08/12/15 1713  WBC 9.1  NEUTROABS 5.1  HGB 12.1  HCT 37.6  MCV 93.3  PLT 418*   Cardiac Enzymes: No results for input(s): CKTOTAL, CKMB, CKMBINDEX, TROPONINI in the last 168 hours. BNP: BNP (last 3 results)  Recent Labs  08/12/15 1713  BNP 18.8    ProBNP (last 3 results) No results for input(s): PROBNP in the last 8760 hours.  CBG: No results for input(s): GLUCAP in the last 168 hours.

## 2015-08-14 ENCOUNTER — Telehealth: Payer: Self-pay | Admitting: *Deleted

## 2015-08-14 NOTE — Telephone Encounter (Signed)
Called patient regarding tachy episode on LINQ transmission from 08/12/15.  Patient states she "just got out of the hospital yesterday for a fast heart beat" and that "one of the doctors started me on a medication that's supposed to help control it".  Patient states this medication was just started today and she is not sure what the name of the medication is.  She states she feels "weak", but that she is doing "alright".  Per discharge note, patient was to see PCP, Dr. Fredderick Phenix, for possible addition of rate control medication.  Patient lives at [place] per Care Management note.  Attempted to call Dr. Marjie Skiff office at phone number listed on discharge summary, but phone number states "call can not be completed as dialed".  Called [facility] and spoke with patient's caregiver Sharyn Lull), who states that patient's PCP is Dr. Marylynn Pearson.  Sharyn Lull states that Dr. Dillard Essex started the patient on diltiazem 24hr ER 120mg  every 12 hr beginning today.  Medication added to patient's med list in Epic.  Patient has an appointment with Dr. Caryl Comes on 09/09/15 at 1:30pm.  Will review episode with Dr. Caryl Comes and call patient if Dr. Caryl Comes has any additional recommendations.

## 2015-08-16 NOTE — Telephone Encounter (Signed)
Reviewed episode with Dr. Caryl Comes, no additional recommendations at this time.

## 2015-08-20 ENCOUNTER — Encounter: Payer: Self-pay | Admitting: Internal Medicine

## 2015-08-21 ENCOUNTER — Ambulatory Visit (INDEPENDENT_AMBULATORY_CARE_PROVIDER_SITE_OTHER): Payer: Medicare Other | Admitting: *Deleted

## 2015-08-21 ENCOUNTER — Telehealth: Payer: Self-pay

## 2015-08-21 DIAGNOSIS — R55 Syncope and collapse: Secondary | ICD-10-CM

## 2015-08-21 NOTE — Telephone Encounter (Signed)
Patient's sister-in-law called and stated that the patient is having increased difficulty walking. She felt like the patient needed to come in to see Dr. Jannifer Franklin. Appointment scheduled for tomorrow (10/20) at 8 AM.

## 2015-08-22 ENCOUNTER — Encounter: Payer: Self-pay | Admitting: Internal Medicine

## 2015-08-22 ENCOUNTER — Ambulatory Visit (INDEPENDENT_AMBULATORY_CARE_PROVIDER_SITE_OTHER): Payer: Medicare Other | Admitting: Neurology

## 2015-08-22 ENCOUNTER — Encounter: Payer: Self-pay | Admitting: Neurology

## 2015-08-22 VITALS — BP 122/74 | HR 100 | Ht 60.0 in

## 2015-08-22 DIAGNOSIS — R269 Unspecified abnormalities of gait and mobility: Secondary | ICD-10-CM

## 2015-08-22 DIAGNOSIS — G20 Parkinson's disease: Secondary | ICD-10-CM | POA: Diagnosis not present

## 2015-08-22 MED ORDER — CARBIDOPA-LEVODOPA-ENTACAPONE 25-100-200 MG PO TABS: 1.0000 | ORAL_TABLET | Freq: Three times a day (TID) | ORAL | Status: DC

## 2015-08-22 NOTE — Progress Notes (Signed)
LOOP RECORDER  

## 2015-08-22 NOTE — Progress Notes (Signed)
Reason for visit: Parkinson's disease  [name] is an 81 y.o. female  History of present illness:  [name] is an 81 year old right-handed white female with a history of Parkinson's disease. The patient also has some mild memory issues. The patient is residing in an extended care facility in assisted living, . The patient lives with her husband who is suffering from dementia. The patient had a fall on 06/05/2015. The patient fractured her right arm, she has not required surgery yet, but she may in the future. The patient has not been ambulating much since that time. The patient indicates that prior to the fall, she was walking fairly well, she has a walker for ambulation. She believes that the addition of Sinemet did improve her ability to ambulate significantly. The patient remains on 25/100 mg tablets 3 times daily. The patient is getting some occupational therapy, but she has not been walking much since August. The patient was just in the hospital with epistaxis. She comes back to the office today for an evaluation.  Past Medical History  Diagnosis Date  . Parkinson disease ([place])   . Cerebrovascular disease     CVA '03  . TIA (transient ischemic attack)     Multiple  . Depression   . Dyslipidemia   . Glaucoma   . GERD (gastroesophageal reflux disease)   . Obesity   . Endometriosis   . Paralysis agitans ([place]) 08/31/2013  . Macular degeneration     bilateral  . Hypertension   . Asthma   . Anxiety   . Headache(784.0)   . Arthritis   . Abnormality of gait 02/05/2015    Past Surgical History  Procedure Laterality Date  . Gallbladder surgery    . Abdominal hysterectomy    . Tonsillectomy    . Cataract extraction, bilateral    . Pilonidal cyst resection    . Tilt table study  05-28-14    orthostatic hypertension  . Loop recorder implant  05-28-14    MDT LINQ implanted by Dr Caryl Comes for cryptogenic stroke/palpitations  . Tilt table study N/A 05/28/2014     Procedure: TILT TABLE STUDY;  Surgeon: Deboraha Sprang, MD;  Location: [facility] CATH LAB;  Service: Cardiovascular;  Laterality: N/A;  . Loop recorder implant N/A 05/28/2014    Procedure: LOOP RECORDER IMPLANT;  Surgeon: Deboraha Sprang, MD;  Location: [facility] CATH LAB;  Service: Cardiovascular;  Laterality: N/A;    Family History  Problem Relation Age of Onset  . Arthritis Brother   . Skin cancer Brother   . Heart attack Mother     Social history:  reports that she has never smoked. She has never used smokeless tobacco. She reports that she does not drink alcohol or use illicit drugs.    Allergies  Allergen Reactions  . Comtan [Entacapone]   . Mirapex [Pramipexole Dihydrochloride]   . Sulfa Antibiotics Nausea And Vomiting  . Ciprofloxacin     Made her feel crazy    Medications:  Prior to Admission medications   Medication Sig Start Date End Date Taking? Authorizing Provider  acetaminophen (TYLENOL) 325 MG tablet Take 650 mg by mouth every 4 (four) hours as needed for moderate pain.    Historical Provider, MD  ALPRAZolam Duanne Moron) 0.5 MG tablet Take 0.5 mg by mouth 3 (three) times daily.     Historical Provider, MD  aspirin EC 325 MG tablet Take 325 mg by mouth daily.    Historical Provider, MD  Calcium Carbonate Antacid (CALCIUM ANTACID EXTRA STRENGTH PO) Take 300-600 mg by mouth daily as needed (for heartburn).    Historical Provider, MD  carbidopa-levodopa (SINEMET IR) 25-250 MG per tablet Take 1 tablet by mouth 3 (three) times daily before meals. Patient takes at 7am, 12noon, and 5pm 08/16/13   Historical Provider, MD  Cholecalciferol (VITAMIN D-3) 5000 UNITS TABS Take 5,000 Units by mouth daily.     Historical Provider, MD  clopidogrel (PLAVIX) 75 MG tablet Take 75 mg by mouth at bedtime.     Historical Provider, MD  dicyclomine (BENTYL) 20 MG tablet Take 20 mg by mouth 4 (four) times daily as needed (for IBS symptoms).     Historical Provider, MD  diltiazem (TIAZAC) 120 MG 24 hr capsule  Take 120 mg by mouth 2 (two) times daily.    Jasmine Pearson, MD  DULoxetine (CYMBALTA) 20 MG capsule Take 20 mg by mouth daily.    Historical Provider, MD  fenofibrate 160 MG tablet Take 160 mg by mouth daily.    Historical Provider, MD  fluticasone (FLONASE) 50 MCG/ACT nasal spray Place 2 sprays into the nose daily.    Historical Provider, MD  guaiFENesin (MUCINEX) 600 MG 12 hr tablet Take 600 mg by mouth 2 (two) times daily.    Historical Provider, MD  HYDROcodone-acetaminophen (NORCO/VICODIN) 5-325 MG per tablet Take 1 tablet by mouth every 6 (six) hours as needed for severe pain. Patient taking differently: Take 1 tablet by mouth 4 (four) times daily.  06/05/15   Forde Dandy, MD  hydrocortisone cream (HYDROCORTISONE MAX ST) 1 % Apply 1 application topically 2 (two) times daily as needed for itching.    Historical Provider, MD  losartan (COZAAR) 25 MG tablet Take 25 mg by mouth daily. 08/16/13   Historical Provider, MD  montelukast (SINGULAIR) 10 MG tablet Take 10 mg by mouth daily. 08/16/13   Historical Provider, MD  Multiple Vitamin (TAB-A-VITE) TABS Take 1 tablet by mouth daily.    Historical Provider, MD  oxymetazoline (AFRIN) 0.05 % nasal spray Place 1 spray into both nostrils once. 08/13/15   Robbie Lis, MD  pantoprazole (PROTONIX) 40 MG tablet Take 40 mg by mouth daily.    Historical Provider, MD  polyethylene glycol (MIRALAX / GLYCOLAX) packet Take 17 g by mouth daily as needed for moderate constipation.     Historical Provider, MD  promethazine (PHENERGAN) 12.5 MG suppository Place 12.5 mg rectally every 6 (six) hours as needed for nausea or vomiting.    Historical Provider, MD  Propylhexedrine [facility] NA) Place 1 spray into the nose every 4 (four) hours as needed (nasal congestion).     Historical Provider, MD  simethicone (MYLICON) 259 MG chewable tablet Chew 125 mg by mouth every 6 (six) hours as needed for flatulence.    Historical Provider, MD  talc (ZEASORB) powder Apply 1  application topically 2 (two) times daily as needed (for irritation).    Historical Provider, MD  travoprost, benzalkonium, (TRAVATAN) 0.004 % ophthalmic solution Place 1 drop into both eyes at bedtime.    Historical Provider, MD    ROS:  Out of a complete 14 system review of symptoms, the patient complains only of the following symptoms, and all other reviewed systems are negative.  Gait disorder Memory problems  Blood pressure 122/74, pulse 100, height 5' (1.524 m).  Physical Exam  General: The patient is alert and cooperative at the time of the examination. The patient is moderately obese.  Skin: No  significant peripheral edema is noted.   Neurologic Exam  Mental status: The patient is alert and oriented x 2 at the time of the examination (not oriented to date). The Mini-Mental Status Examination done today shows a total score 27/30. The patient is able to name 7 animals in 30 seconds.   Cranial nerves: Facial symmetry is present. Speech is normal, no aphasia or dysarthria is noted. Extraocular movements are full. Visual fields are full.  Motor: The patient has good strength in all 4 extremities, but the right arm could not be fully tested. The right arm is in a sling.  Sensory examination: Soft touch sensation is symmetric on the face, arms, and legs.  Coordination: The patient has good finger-nose-finger and heel-to-shin bilaterally, with exception that the right arm could not be tested.  Gait and station: The patient requires assistance with standing. Once up, the patient is able to walk a short distance with assistance. She has some instability with turns. Romberg is negative. Tandem gait was not attempted.  Reflexes: Deep tendon reflexes are symmetric.   Assessment/Plan:  1. Parkinson's disease  2. Gait disorder  3. Memory disorder  The patient has had a setback with her ability to walk. She will need physical therapy at some point when the right arm issue has  resolved. The patient had been using a walker to get around, and she was doing well with this. The patient will be taken off of Sinemet, switched to Stalevo at the 25/100/200 mg tablets, taking 1 tablet 3 times daily. She will follow-up in 4 months.  Jill Alexanders MD 08/22/2015 9:04 PM  [facility] [address] [place], [place] [address]  Phone [phone] Fax [phone]

## 2015-08-22 NOTE — Patient Instructions (Addendum)
We will stop the Sinemet, and change to Stalevo taking one tablet three times a day. Physical therapy for the walking will be needed at some point.   Fall Prevention in the Home  Falls can cause injuries and can affect people from all age groups. There are many simple things that you can do to make your home safe and to help prevent falls. WHAT CAN I DO ON THE OUTSIDE OF MY HOME?  Regularly repair the edges of walkways and driveways and fix any cracks.  Remove high doorway thresholds.  Trim any shrubbery on the main path into your home.  Use bright outdoor lighting.  Clear walkways of debris and clutter, including tools and rocks.  Regularly check that handrails are securely fastened and in good repair. Both sides of any steps should have handrails.  Install guardrails along the edges of any raised decks or porches.  Have leaves, snow, and ice cleared regularly.  Use sand or salt on walkways during winter months.  In the garage, clean up any spills right away, including grease or oil spills. WHAT CAN I DO IN THE BATHROOM?  Use night lights.  Install grab bars by the toilet and in the tub and shower. Do not use towel bars as grab bars.  Use non-skid mats or decals on the floor of the tub or shower.  If you need to sit down while you are in the shower, use a plastic, non-slip stool.Marland Kitchen  Keep the floor dry. Immediately clean up any water that spills on the floor.  Remove soap buildup in the tub or shower on a regular basis.  Attach bath mats securely with double-sided non-slip rug tape.  Remove throw rugs and other tripping hazards from the floor. WHAT CAN I DO IN THE BEDROOM?  Use night lights.  Make sure that a bedside light is easy to reach.  Do not use oversized bedding that drapes onto the floor.  Have a firm chair that has side arms to use for getting dressed.  Remove throw rugs and other tripping hazards from the floor. WHAT CAN I DO IN THE KITCHEN?    Clean up any spills right away.  Avoid walking on wet floors.  Place frequently used items in easy-to-reach places.  If you need to reach for something above you, use a sturdy step stool that has a grab bar.  Keep electrical cables out of the way.  Do not use floor polish or wax that makes floors slippery. If you have to use wax, make sure that it is non-skid floor wax.  Remove throw rugs and other tripping hazards from the floor. WHAT CAN I DO IN THE STAIRWAYS?  Do not leave any items on the stairs.  Make sure that there are handrails on both sides of the stairs. Fix handrails that are broken or loose. Make sure that handrails are as long as the stairways.  Check any carpeting to make sure that it is firmly attached to the stairs. Fix any carpet that is loose or worn.  Avoid having throw rugs at the top or bottom of stairways, or secure the rugs with carpet tape to prevent them from moving.  Make sure that you have a light switch at the top of the stairs and the bottom of the stairs. If you do not have them, have them installed. WHAT ARE SOME OTHER FALL PREVENTION TIPS?  Wear closed-toe shoes that fit well and support your feet. Wear shoes that have rubber soles  or low heels.  When you use a stepladder, make sure that it is completely opened and that the sides are firmly locked. Have someone hold the ladder while you are using it. Do not climb a closed stepladder.  Add color or contrast paint or tape to grab bars and handrails in your home. Place contrasting color strips on the first and last steps.  Use mobility aids as needed, such as canes, walkers, scooters, and crutches.  Turn on lights if it is dark. Replace any light bulbs that burn out.  Set up furniture so that there are clear paths. Keep the furniture in the same spot.  Fix any uneven floor surfaces.  Choose a carpet design that does not hide the edge of steps of a stairway.  Be aware of any and all  pets.  Review your medicines with your healthcare provider. Some medicines can cause dizziness or changes in blood pressure, which increase your risk of falling. Talk with your health care provider about other ways that you can decrease your risk of falls. This may include working with a physical therapist or trainer to improve your strength, balance, and endurance.   This information is not intended to replace advice given to you by your health care provider. Make sure you discuss any questions you have with your health care provider.   Document Released: 10/09/2002 Document Revised: 03/05/2015 Document Reviewed: 11/23/2014 Elsevier Interactive Patient Education [employer].

## 2015-08-26 ENCOUNTER — Telehealth: Payer: Self-pay | Admitting: Neurology

## 2015-08-26 NOTE — Telephone Encounter (Signed)
I called the patient. She stated that the NP or MD at the facility she lives said she is allergic to Stalevo. It looks like it is the Comtan component. The patient is not sure what happened when she took this and she seems to be a little confused as to whether she has taken any of the Stalevo since Dr. Jannifer Franklin prescribed it. She would like to know from Dr. Jannifer Franklin if there is anything else she can take for her Parkinson's.

## 2015-08-26 NOTE — Telephone Encounter (Signed)
Pt called sts PCP thinks she is having allergic reaction to last medication Dr Jannifer Franklin prescribed at last OV (carbidopa-levodopa-entacapone (STALEVO 100) 25-100-200 MG tablet ?-she did not know name of medication) She does not know what symptoms the PCP were talking about. She said [place]CP [place] now. Please call and advise at [phone].

## 2015-08-26 NOTE — Telephone Encounter (Signed)
I called the patient. The patient has a sensitivity to Comtan, we will stop the Stalevo. The patient was on the Sinemet 25/250 mg tablets 3 times daily. We will go up slightly on the medication. The patient will call back with the fax number that I can fax the prescription to tomorrow.

## 2015-08-27 MED ORDER — CARBIDOPA-LEVODOPA 25-100 MG PO TABS: 0.5000 | ORAL_TABLET | Freq: Three times a day (TID) | ORAL | Status: DC

## 2015-08-27 NOTE — Telephone Encounter (Addendum)
I called patient. I will fax order to [facility] at [phone]. We will go up on the Sinemet 25/250 tablets. I will add one half of a 25/100 mg tablet to each dose. I will fax a prescription over. We will continue the Sinemet 25/250 mg tablet, one tablet 3 times daily. I will have them disregard the order for Stalevo.

## 2015-09-04 ENCOUNTER — Emergency Department (HOSPITAL_COMMUNITY)
Admission: EM | Admit: 2015-09-04 | Discharge: 2015-09-04 | Disposition: A | Discharge status: Home / Self Care | Payer: Medicare Other | Attending: Emergency Medicine | Admitting: Emergency Medicine

## 2015-09-04 ENCOUNTER — Emergency Department (HOSPITAL_COMMUNITY): Payer: Medicare Other

## 2015-09-04 ENCOUNTER — Encounter (HOSPITAL_COMMUNITY): Payer: Self-pay | Admitting: Emergency Medicine

## 2015-09-04 DIAGNOSIS — K219 Gastro-esophageal reflux disease without esophagitis: Secondary | ICD-10-CM | POA: Insufficient documentation

## 2015-09-04 DIAGNOSIS — Y9289 Other specified places as the place of occurrence of the external cause: Secondary | ICD-10-CM | POA: Diagnosis not present

## 2015-09-04 DIAGNOSIS — H409 Unspecified glaucoma: Secondary | ICD-10-CM | POA: Diagnosis not present

## 2015-09-04 DIAGNOSIS — E669 Obesity, unspecified: Secondary | ICD-10-CM | POA: Insufficient documentation

## 2015-09-04 DIAGNOSIS — Y9389 Activity, other specified: Secondary | ICD-10-CM | POA: Diagnosis not present

## 2015-09-04 DIAGNOSIS — Y998 Other external cause status: Secondary | ICD-10-CM | POA: Insufficient documentation

## 2015-09-04 DIAGNOSIS — Z7982 Long term (current) use of aspirin: Secondary | ICD-10-CM | POA: Diagnosis not present

## 2015-09-04 DIAGNOSIS — I10 Essential (primary) hypertension: Secondary | ICD-10-CM | POA: Insufficient documentation

## 2015-09-04 DIAGNOSIS — Z7902 Long term (current) use of antithrombotics/antiplatelets: Secondary | ICD-10-CM | POA: Insufficient documentation

## 2015-09-04 DIAGNOSIS — R040 Epistaxis: Secondary | ICD-10-CM | POA: Insufficient documentation

## 2015-09-04 DIAGNOSIS — G20 Parkinson's disease: Secondary | ICD-10-CM | POA: Diagnosis not present

## 2015-09-04 DIAGNOSIS — W01198A Fall on same level from slipping, tripping and stumbling with subsequent striking against other object, initial encounter: Secondary | ICD-10-CM | POA: Insufficient documentation

## 2015-09-04 DIAGNOSIS — J45909 Unspecified asthma, uncomplicated: Secondary | ICD-10-CM | POA: Insufficient documentation

## 2015-09-04 DIAGNOSIS — Z79899 Other long term (current) drug therapy: Secondary | ICD-10-CM | POA: Insufficient documentation

## 2015-09-04 DIAGNOSIS — M1990 Unspecified osteoarthritis, unspecified site: Secondary | ICD-10-CM | POA: Insufficient documentation

## 2015-09-04 DIAGNOSIS — Z8742 Personal history of other diseases of the female genital tract: Secondary | ICD-10-CM | POA: Insufficient documentation

## 2015-09-04 DIAGNOSIS — Z8673 Personal history of transient ischemic attack (TIA), and cerebral infarction without residual deficits: Secondary | ICD-10-CM | POA: Diagnosis not present

## 2015-09-04 DIAGNOSIS — F419 Anxiety disorder, unspecified: Secondary | ICD-10-CM | POA: Diagnosis not present

## 2015-09-04 DIAGNOSIS — F329 Major depressive disorder, single episode, unspecified: Secondary | ICD-10-CM | POA: Insufficient documentation

## 2015-09-04 DIAGNOSIS — R42 Dizziness and giddiness: Secondary | ICD-10-CM | POA: Insufficient documentation

## 2015-09-04 DIAGNOSIS — R269 Unspecified abnormalities of gait and mobility: Secondary | ICD-10-CM

## 2015-09-04 LAB — CBC
HCT: 36.9 % (ref 36.0–46.0)
HEMOGLOBIN: 12 g/dL (ref 12.0–15.0)
MCH: 30.3 pg (ref 26.0–34.0)
MCHC: 32.5 g/dL (ref 30.0–36.0)
MCV: 93.2 fL (ref 78.0–100.0)
Platelets: 419 10*3/uL — ABNORMAL HIGH (ref 150–400)
RBC: 3.96 MIL/uL (ref 3.87–5.11)
RDW: 13.6 % (ref 11.5–15.5)
WBC: 8.5 10*3/uL (ref 4.0–10.5)

## 2015-09-04 LAB — BASIC METABOLIC PANEL
ANION GAP: 8 (ref 5–15)
BUN: 26 mg/dL — ABNORMAL HIGH (ref 6–20)
CALCIUM: 9.9 mg/dL (ref 8.9–10.3)
CHLORIDE: 105 mmol/L (ref 101–111)
CO2: 27 mmol/L (ref 22–32)
CREATININE: 0.91 mg/dL (ref 0.44–1.00)
GFR calc non Af Amer: 57 mL/min — ABNORMAL LOW (ref >60)
Glucose, Bld: 120 mg/dL — ABNORMAL HIGH (ref 65–99)
Potassium: 3.8 mmol/L (ref 3.5–5.1)
SODIUM: 140 mmol/L (ref 135–145)

## 2015-09-04 MED ORDER — SILVER NITRATE-POT NITRATE 75-25 % EX MISC
CUTANEOUS | Status: AC
  Filled 2015-09-04: qty 1

## 2015-09-04 MED ORDER — OXYMETAZOLINE HCL 0.05 % NA SOLN: 1.0000 | Freq: Once | NASAL | Status: DC

## 2015-09-04 NOTE — ED Provider Notes (Signed)
CSN: [id number]     Arrival date & time 09/04/15  0134 History  By signing my name below, I, [name], attest that this documentation has been prepared under the direction and in the presence of Jasmine Etienne, DO. Electronically Signed: Eustaquio Abbott, ED Scribe. 09/04/2015. 2:01 AM.  Chief Complaint  Patient presents with  . Epistaxis  . Fall   The history is provided by the patient. No language interpreter was used.     HPI Comments: [name] is a 81 y.o. female brought in by ambulance, with hx parkinson disease, CVA, TIA, and HTN who presents to the Emergency Department complaining of sudden onset, epistaxis in left naris that occurred earlier tonight. Bleeding is controlled. Denies any injury that could have caused the nose blood. Pt states she feels "woozy" but is otherwise complaint free. Family member states that pt has been having frequent nose bleeds for the past couple of months. They also report that pt fell last night and hit her head but did not come to the ED to be evaluated. Pt states that she lost her balance from her Parkinson's and fell. No LOC. Denies chest pain, shortness of breath, back pain, neck pain, abdominal pain, or any other associated symptoms. Pt is currently on Plavix.   Past Medical History  Diagnosis Date  . Parkinson disease ([place])   . Cerebrovascular disease     CVA '03  . TIA (transient ischemic attack)     Multiple  . Depression   . Dyslipidemia   . Glaucoma   . GERD (gastroesophageal reflux disease)   . Obesity   . Endometriosis   . Paralysis agitans ([place]) 08/31/2013  . Macular degeneration     bilateral  . Hypertension   . Asthma   . Anxiety   . Headache(784.0)   . Arthritis   . Abnormality of gait 02/05/2015   Past Surgical History  Procedure Laterality Date  . Gallbladder surgery    . Abdominal hysterectomy    . Tonsillectomy    . Cataract extraction, bilateral    . Pilonidal cyst resection    . Tilt table study  05-28-14     orthostatic hypertension  . Loop recorder implant  05-28-14    MDT LINQ implanted by Dr Caryl Comes for cryptogenic stroke/palpitations  . Tilt table study N/A 05/28/2014    Procedure: TILT TABLE STUDY;  Surgeon: Deboraha Sprang, MD;  Location: [facility] CATH LAB;  Service: Cardiovascular;  Laterality: N/A;  . Loop recorder implant N/A 05/28/2014    Procedure: LOOP RECORDER IMPLANT;  Surgeon: Deboraha Sprang, MD;  Location: [facility] CATH LAB;  Service: Cardiovascular;  Laterality: N/A;   Family History  Problem Relation Age of Onset  . Arthritis Brother   . Skin cancer Brother   . Heart attack Mother    Social History  Substance Use Topics  . Smoking status: Never Smoker   . Smokeless tobacco: Never Used  . Alcohol Use: No   OB History    No data available     Review of Systems  Constitutional: Negative for fever and chills.  HENT: Positive for nosebleeds. Negative for congestion and rhinorrhea.   Eyes: Negative for redness and visual disturbance.  Respiratory: Negative for shortness of breath and wheezing.   Cardiovascular: Negative for chest pain and palpitations.  Gastrointestinal: Negative for nausea and vomiting.  Genitourinary: Negative for dysuria and urgency.  Musculoskeletal: Negative for myalgias and arthralgias.  Skin: Negative for pallor and wound.  Neurological:  Positive for light-headedness. Negative for dizziness and headaches.   Allergies  Comtan; Mirapex; Sulfa antibiotics; and Ciprofloxacin  Home Medications   Prior to Admission medications   Medication Sig Start Date End Date Taking? Authorizing Provider  acetaminophen (TYLENOL) 325 MG tablet Take 650 mg by mouth every 4 (four) hours as needed for moderate pain.   Yes Historical Provider, MD  ALPRAZolam Duanne Moron) 0.5 MG tablet Take 0.5 mg by mouth 3 (three) times daily.    Yes Historical Provider, MD  alum & mag hydroxide-simeth (MINTOX) 200-200-20 MG/5ML suspension Take 30 mLs by mouth every 6 (six) hours as needed for  indigestion or heartburn.   Yes Historical Provider, MD  aspirin EC 325 MG tablet Take 325 mg by mouth daily.   Yes Historical Provider, MD  Calcium Carbonate Antacid (CALCIUM ANTACID EXTRA STRENGTH PO) Take 300-600 mg by mouth daily as needed (for heartburn).   Yes Historical Provider, MD  carbidopa-levodopa (SINEMET IR) 25-100 MG tablet Take 0.5 tablets by mouth 3 (three) times daily. 08/27/15  Yes Kathrynn Ducking, MD  carbidopa-levodopa (SINEMET IR) 25-250 MG tablet Take 1 tablet by mouth 3 (three) times daily.   Yes Historical Provider, MD  Cholecalciferol (VITAMIN D-3) 5000 UNITS TABS Take 5,000 Units by mouth daily.    Yes Historical Provider, MD  clopidogrel (PLAVIX) 75 MG tablet Take 75 mg by mouth at bedtime.    Yes Historical Provider, MD  dicyclomine (BENTYL) 20 MG tablet Take 20 mg by mouth 4 (four) times daily as needed (for IBS symptoms).    Yes Historical Provider, MD  diltiazem (TIAZAC) 120 MG 24 hr capsule Take 120 mg by mouth 2 (two) times daily.   Yes Marylynn Pearson, MD  DULoxetine (CYMBALTA) 20 MG capsule Take 20 mg by mouth daily.   Yes Historical Provider, MD  fenofibrate 160 MG tablet Take 160 mg by mouth daily.   Yes Historical Provider, MD  guaiFENesin (MUCINEX) 600 MG 12 hr tablet Take 600 mg by mouth 2 (two) times daily.   Yes Historical Provider, MD  HYDROcodone-acetaminophen (NORCO/VICODIN) 5-325 MG per tablet Take 1 tablet by mouth every 6 (six) hours as needed for severe pain. Patient taking differently: Take 1 tablet by mouth 4 (four) times daily.  06/05/15  Yes Forde Dandy, MD  hydrocortisone cream (HYDROCORTISONE MAX ST) 1 % Apply 1 application topically 2 (two) times daily as needed for itching.   Yes Historical Provider, MD  loperamide (IMODIUM) 2 MG capsule Take 2 mg by mouth as needed for diarrhea or loose stools.   Yes Historical Provider, MD  losartan (COZAAR) 25 MG tablet Take 25 mg by mouth daily. 08/16/13  Yes Historical Provider, MD  montelukast (SINGULAIR)  10 MG tablet Take 10 mg by mouth daily. 08/16/13  Yes Historical Provider, MD  Multiple Vitamin (TAB-A-VITE) TABS Take 1 tablet by mouth daily.   Yes Historical Provider, MD  oxymetazoline (AFRIN) 0.05 % nasal spray Place 1 spray into both nostrils once. 08/13/15  Yes Robbie Lis, MD  pantoprazole (PROTONIX) 40 MG tablet Take 40 mg by mouth daily.   Yes Historical Provider, MD  polyethylene glycol (MIRALAX / GLYCOLAX) packet Take 17 g by mouth daily as needed for moderate constipation.    Yes Historical Provider, MD  promethazine (PHENERGAN) 12.5 MG suppository Place 12.5 mg rectally every 6 (six) hours as needed for nausea or vomiting.   Yes Historical Provider, MD  Propylhexedrine (BENZEDREX NA) Place 1 spray into the nose every 4 (  four) hours as needed (nasal congestion).    Yes Historical Provider, MD  simethicone (MYLICON) 924 MG chewable tablet Chew 125 mg by mouth every 6 (six) hours as needed for flatulence.   Yes Historical Provider, MD  talc (ZEASORB) powder Apply 1 application topically 2 (two) times daily as needed (for irritation).   Yes Historical Provider, MD  travoprost, benzalkonium, (TRAVATAN) 0.004 % ophthalmic solution Place 1 drop into both eyes at bedtime.   Yes Historical Provider, MD   Triage Vitals: BP 140/60 mmHg  Pulse 85  Temp(Src) 98.1 F (36.7 C)  Resp 17  Ht 5' (1.524 m)  Wt 201 lb (91.173 kg)  BMI 39.26 kg/m2  SpO2 95%   Physical Exam  Constitutional: She is oriented to person, place, and time. She appears well-developed and well-nourished. No distress.  HENT:  Head: Normocephalic and atraumatic.  Area of increased vascularity to Kiesselbach's plexus Old blood in oropharynx without any continued evident bleeding  Eyes: EOM are normal. Pupils are equal, round, and reactive to light.  Neck: Normal range of motion. Neck supple.  Cardiovascular: Normal rate and regular rhythm.  Exam reveals no gallop and no friction rub.   No murmur heard. Pulmonary/Chest:  Effort normal. She has no wheezes. She has no rales.  Abdominal: Soft. She exhibits no distension. There is no tenderness.  Musculoskeletal: She exhibits no edema or tenderness.  Neurological: She is alert and oriented to person, place, and time.  Skin: Skin is warm and dry. She is not diaphoretic.  Psychiatric: She has a normal mood and affect. Her behavior is normal.  Nursing note and vitals reviewed.   ED Course  .Epistaxis Management Date/Time: 09/04/2015 4:39 AM Performed by: Tyrone Nine Marna Weniger Authorized by: [name] Consent: Verbal consent obtained. Risks and benefits: risks, benefits and alternatives were discussed Consent given by: patient Required items: required blood products, implants, devices, and special equipment available Patient identity confirmed: verbally with patient Time out: Immediately prior to procedure a "time out" was called to verify the correct patient, procedure, equipment, support staff and site/side marked as required. Anesthesia: local infiltration Treatment site: left anterior Repair method: silver nitrate Treatment complexity: simple Patient tolerance: Patient tolerated the procedure well with no immediate complications   (including critical care time)  DIAGNOSTIC STUDIES: Oxygen Saturation is 98% on RA, normal by my interpretation.    COORDINATION OF CARE: 1:56 AM-Discussed treatment plan which includes CT Head, Protime INR, CBC, and cauterization with pt at bedside and pt agreed to plan.   Labs Review Labs Reviewed  CBC - Abnormal; Notable for the following:    Platelets 419 (*)    All other components within normal limits  BASIC METABOLIC PANEL - Abnormal; Notable for the following:    Glucose, Bld 120 (*)    BUN 26 (*)    GFR calc non Af Amer 57 (*)    All other components within normal limits    Imaging Review Ct Head Wo Contrast  09/04/2015  CLINICAL DATA:  Patient fell on 09/03/2015. Lost balance due to Parkinson's. No loss of  consciousness. Bleeding in the left side of the nose. EXAM: CT HEAD WITHOUT CONTRAST TECHNIQUE: Contiguous axial images were obtained from the base of the skull through the vertex without intravenous contrast. COMPARISON:  06/05/2015 FINDINGS: Diffuse cerebral atrophy. Mild ventricular dilatation consistent with central atrophy. Patchy low-attenuation changes in the deep white matter consistent with small vessel ischemia. No mass effect or midline shift. No abnormal extra-axial fluid collections. Gray-white  matter junctions are distinct. Basal cisterns are not effaced. No evidence of acute intracranial hemorrhage. No depressed skull fractures. Visualized paranasal sinuses and mastoid air cells are not opacified. Nasal bone deformities consistent with old fractures. No change since previous study. Vascular calcifications. IMPRESSION: No acute intracranial abnormalities. Chronic atrophy and small vessel ischemic changes. Old nasal bone fractures. Electronically Signed   By: Lucienne Capers M.D.   On: 09/04/2015 03:33   I have personally reviewed and evaluated these images and lab results as part of my medical decision-making.   EKG Interpretation None      MDM   Final diagnoses:  Epistaxis, recurrent  Abnormality of gait     81 yo F with a chief complaint of epistaxis. This started earlier today. Patient fell yesterday backwards onto her head. Patient has had multiple falls in the past due to Parkinson's disease. She states this is the same issue. Felt like she lost a significant amount of blood during the epistaxis. Is also been a recurrent issue.  Small area of increased vascularity to the left Kiesselbach's plexus. Silver nitrate applied. No continued bleeding on the ED. CT of the head negative. Laboratory evaluation unremarkable. Discharge home.   I personally performed the services described in this documentation, which was scribed in my presence. The recorded information has been reviewed and  is accurate.  4:43 AM:  I have discussed the diagnosis/risks/treatment options with the patient and family and believe the pt to be eligible for discharge home to follow-up with PCP. We also discussed returning to the ED immediately if new or worsening sx occur. We discussed the sx which are most concerning (e.g., sudden worsening pain, fever, ams) that necessitate immediate return. Medications administered to the patient during their visit and any new prescriptions provided to the patient are listed below.  Medications given during this visit Medications  silver nitrate applicators 06-26 % applicator (not administered)    Discharge Medication List as of 09/04/2015  3:43 AM      The patient appears reasonably screen and/or stabilized for discharge and I doubt any other medical condition or other [facility] requiring further screening, evaluation, or treatment in the ED at this time prior to discharge.       Jasmine Etienne, DO 09/04/15 [phone]

## 2015-09-04 NOTE — ED Notes (Signed)
Patient transported to CT 

## 2015-09-04 NOTE — ED Notes (Signed)
Bed: SX11 Expected date:  Expected time:  Means of arrival:  Comments: EMS nosebleed

## 2015-09-04 NOTE — ED Notes (Addendum)
Pt from [facility] via [facility] with nose bleed more in left nare that began while laying in bed. Staff unable to control with afrin HX of nose bleed x1 month ago. Pt had a fall at 9 pm while standing "states she fell straight back from standing position. Has hemartoma, denies LOC but has mild neck pain. Pt is taking Plavix. Bleeding controlled at triage.    Vitals 158/74 84 HR 20 Resp 98% RA

## 2015-09-04 NOTE — ED Notes (Signed)
MD at bedside. 

## 2015-09-04 NOTE — ED Notes (Signed)
Pt with sling present at triage. Pt states the right arm is broken in seven places from fall. Sling removed due to presence of blood, gown thrown away per patients request.

## 2015-09-04 NOTE — Discharge Instructions (Signed)
Fall Prevention in the Home  Falls can cause injuries and can affect people from all age groups. There are many simple things that you can do to make your home safe and to help prevent falls. WHAT CAN I DO ON THE OUTSIDE OF MY HOME?  Regularly repair the edges of walkways and driveways and fix any cracks.  Remove high doorway thresholds.  Trim any shrubbery on the main path into your home.  Use bright outdoor lighting.  Clear walkways of debris and clutter, including tools and rocks.  Regularly check that handrails are securely fastened and in good repair. Both sides of any steps should have handrails.  Install guardrails along the edges of any raised decks or porches.  Have leaves, snow, and ice cleared regularly.  Use sand or salt on walkways during winter months.  In the garage, clean up any spills right away, including grease or oil spills. WHAT CAN I DO IN THE BATHROOM?  Use night lights.  Install grab bars by the toilet and in the tub and shower. Do not use towel bars as grab bars.  Use non-skid mats or decals on the floor of the tub or shower.  If you need to sit down while you are in the shower, use a plastic, non-slip stool..  Keep the floor dry. Immediately clean up any water that spills on the floor.  Remove soap buildup in the tub or shower on a regular basis.  Attach bath mats securely with double-sided non-slip rug tape.  Remove throw rugs and other tripping hazards from the floor. WHAT CAN I DO IN THE BEDROOM?  Use night lights.  Make sure that a bedside light is easy to reach.  Do not use oversized bedding that drapes onto the floor.  Have a firm chair that has side arms to use for getting dressed.  Remove throw rugs and other tripping hazards from the floor. WHAT CAN I DO IN THE KITCHEN?   Clean up any spills right away.  Avoid walking on wet floors.  Place frequently used items in easy-to-reach places.  If you need to reach for something  above you, use a sturdy step stool that has a grab bar.  Keep electrical cables out of the way.  Do not use floor polish or wax that makes floors slippery. If you have to use wax, make sure that it is non-skid floor wax.  Remove throw rugs and other tripping hazards from the floor. WHAT CAN I DO IN THE STAIRWAYS?  Do not leave any items on the stairs.  Make sure that there are handrails on both sides of the stairs. Fix handrails that are broken or loose. Make sure that handrails are as long as the stairways.  Check any carpeting to make sure that it is firmly attached to the stairs. Fix any carpet that is loose or worn.  Avoid having throw rugs at the top or bottom of stairways, or secure the rugs with carpet tape to prevent them from moving.  Make sure that you have a light switch at the top of the stairs and the bottom of the stairs. If you do not have them, have them installed. WHAT ARE SOME OTHER FALL PREVENTION TIPS?  Wear closed-toe shoes that fit well and support your feet. Wear shoes that have rubber soles or low heels.  When you use a stepladder, make sure that it is completely opened and that the sides are firmly locked. Have someone hold the ladder while you   are using it. Do not climb a closed stepladder.  Add color or contrast paint or tape to grab bars and handrails in your home. Place contrasting color strips on the first and last steps.  Use mobility aids as needed, such as canes, walkers, scooters, and crutches.  Turn on lights if it is dark. Replace any light bulbs that burn out.  Set up furniture so that there are clear paths. Keep the furniture in the same spot.  Fix any uneven floor surfaces.  Choose a carpet design that does not hide the edge of steps of a stairway.  Be aware of any and all pets.  Review your medicines with your healthcare provider. Some medicines can cause dizziness or changes in blood pressure, which increase your risk of falling. Talk  with your health care provider about other ways that you can decrease your risk of falls. This may include working with a physical therapist or trainer to improve your strength, balance, and endurance.   This information is not intended to replace advice given to you by your health care provider. Make sure you discuss any questions you have with your health care provider.   Document Released: 10/09/2002 Document Revised: 03/05/2015 Document Reviewed: 11/23/2014 Elsevier Interactive Patient Education 2016 [employer]  

## 2015-09-04 NOTE — ED Notes (Signed)
Patient care performed by this RN. Son at bedside.

## 2015-09-06 NOTE — Telephone Encounter (Signed)
error 

## 2015-09-09 ENCOUNTER — Encounter: Payer: Self-pay | Admitting: Internal Medicine

## 2015-09-09 ENCOUNTER — Ambulatory Visit (INDEPENDENT_AMBULATORY_CARE_PROVIDER_SITE_OTHER): Payer: Medicare Other | Admitting: Internal Medicine

## 2015-09-09 ENCOUNTER — Telehealth: Payer: Self-pay | Admitting: Neurology

## 2015-09-09 VITALS — BP 112/70 | HR 100 | Ht 60.0 in | Wt 201.0 lb

## 2015-09-09 DIAGNOSIS — I639 Cerebral infarction, unspecified: Secondary | ICD-10-CM

## 2015-09-09 DIAGNOSIS — Z959 Presence of cardiac and vascular implant and graft, unspecified: Secondary | ICD-10-CM

## 2015-09-09 DIAGNOSIS — G459 Transient cerebral ischemic attack, unspecified: Secondary | ICD-10-CM

## 2015-09-09 LAB — CUP PACEART INCLINIC DEVICE CHECK: Date Time Interrogation Session: 20161107142743

## 2015-09-09 NOTE — Progress Notes (Signed)
Patient Care Team: Marylynn Pearson, MD as PCP - General (Internal Medicine) Marylynn Pearson, MD as Referring Physician (Internal Medicine)   HPI  [name] is a 81 y.o. female Seen in follow-up for loop recorder implanted for cryptogenic stroke and multiple TIAs. Telemetry demonstrated sinus rhythm with PACs.   This regard, she was seen in the emergency room 10/16 with tachypalpitations; again PACs were noted on the monitor.   she is an intercurrent problems with nosebleeds. She is currently not ambulatory.  Echocardiogram 7/15 demonstrated normal LV function  Records and Results Reviewed As above   Past Medical History  Diagnosis Date  . Parkinson disease ([place])   . Cerebrovascular disease     CVA '03  . TIA (transient ischemic attack)     Multiple  . Depression   . Dyslipidemia   . Glaucoma   . GERD (gastroesophageal reflux disease)   . Obesity   . Endometriosis   . Paralysis agitans ([place]) 08/31/2013  . Macular degeneration     bilateral  . Hypertension   . Asthma   . Anxiety   . Headache(784.0)   . Arthritis   . Abnormality of gait 02/05/2015    Past Surgical History  Procedure Laterality Date  . Gallbladder surgery    . Abdominal hysterectomy    . Tonsillectomy    . Cataract extraction, bilateral    . Pilonidal cyst resection    . Tilt table study  05-28-14    orthostatic hypertension  . Loop recorder implant  05-28-14    MDT LINQ implanted by Dr Caryl Comes for cryptogenic stroke/palpitations  . Tilt table study N/A 05/28/2014    Procedure: TILT TABLE STUDY;  Surgeon: Deboraha Sprang, MD;  Location: [facility] CATH LAB;  Service: Cardiovascular;  Laterality: N/A;  . Loop recorder implant N/A 05/28/2014    Procedure: LOOP RECORDER IMPLANT;  Surgeon: Deboraha Sprang, MD;  Location: [facility] CATH LAB;  Service: Cardiovascular;  Laterality: N/A;    Current Outpatient Prescriptions  Medication Sig Dispense Refill  . acetaminophen (TYLENOL) 325 MG tablet Take 650 mg  by mouth every 4 (four) hours as needed for moderate pain.    Marland Kitchen ALPRAZolam (XANAX) 0.5 MG tablet Take 0.5 mg by mouth 3 (three) times daily.     Marland Kitchen alum & mag hydroxide-simeth (MINTOX) 884-166-06 MG/5ML suspension Take 30 mLs by mouth every 6 (six) hours as needed for indigestion or heartburn.    Marland Kitchen aspirin EC 325 MG tablet Take 325 mg by mouth daily.    . Calcium Carbonate Antacid (CALCIUM ANTACID EXTRA STRENGTH PO) Take 300-600 mg by mouth daily as needed (for heartburn).    . carbidopa-levodopa (SINEMET IR) 25-100 MG tablet Take 0.5 tablets by mouth 3 (three) times daily. 50 tablet 1  . carbidopa-levodopa (SINEMET IR) 25-250 MG tablet Take 1 tablet by mouth 3 (three) times daily.    . Cholecalciferol (VITAMIN D-3) 5000 UNITS TABS Take 5,000 Units by mouth daily.     . clopidogrel (PLAVIX) 75 MG tablet Take 75 mg by mouth at bedtime.     . dicyclomine (BENTYL) 20 MG tablet Take 20 mg by mouth 4 (four) times daily as needed (for IBS symptoms).     Marland Kitchen diltiazem (TIAZAC) 120 MG 24 hr capsule Take 120 mg by mouth 2 (two) times daily.    . DULoxetine (CYMBALTA) 20 MG capsule Take 20 mg by mouth daily.    . fenofibrate 160 MG tablet Take 160 mg  by mouth daily.    Marland Kitchen guaiFENesin (MUCINEX) 600 MG 12 hr tablet Take 600 mg by mouth 2 (two) times daily.    Marland Kitchen HYDROcodone-acetaminophen (NORCO/VICODIN) 5-325 MG per tablet Take 1 tablet by mouth every 6 (six) hours as needed for severe pain. (Patient taking differently: Take 1 tablet by mouth 4 (four) times daily. ) 6 tablet 0  . hydrocortisone cream (HYDROCORTISONE MAX ST) 1 % Apply 1 application topically 2 (two) times daily as needed for itching.    . loperamide (IMODIUM) 2 MG capsule Take 2 mg by mouth as needed for diarrhea or loose stools.    Marland Kitchen losartan (COZAAR) 25 MG tablet Take 25 mg by mouth daily.    . montelukast (SINGULAIR) 10 MG tablet Take 10 mg by mouth daily.    . Multiple Vitamin (TAB-A-VITE) TABS Take 1 tablet by mouth daily.    Marland Kitchen oxymetazoline  (AFRIN) 0.05 % nasal spray Place 1 spray into both nostrils once. 30 mL 0  . pantoprazole (PROTONIX) 40 MG tablet Take 40 mg by mouth daily.    . polyethylene glycol (MIRALAX / GLYCOLAX) packet Take 17 g by mouth daily as needed for moderate constipation.     . promethazine (PHENERGAN) 12.5 MG suppository Place 12.5 mg rectally every 6 (six) hours as needed for nausea or vomiting.    Marland Kitchen Propylhexedrine (BENZEDREX NA) Place 1 spray into the nose every 4 (four) hours as needed (nasal congestion).     . simethicone (MYLICON) 202 MG chewable tablet Chew 125 mg by mouth every 6 (six) hours as needed for flatulence.    . talc (ZEASORB) powder Apply 1 application topically 2 (two) times daily as needed (for irritation).    . travoprost, benzalkonium, (TRAVATAN) 0.004 % ophthalmic solution Place 1 drop into both eyes at bedtime.     No current facility-administered medications for this visit.    Allergies  Allergen Reactions  . Comtan [Entacapone]   . Mirapex [Pramipexole Dihydrochloride]   . Sulfa Antibiotics Nausea And Vomiting  . Ciprofloxacin     Made her feel crazy      Review of Systems negative except from HPI and PMH  Physical Exam BP 112/70 mmHg  Pulse 100  Ht 5' (1.524 m)  Wt 201 lb (91.173 kg)  BMI 39.26 kg/m2 Well developed and well nourished in no acute distress sitting in wheel chair HENT normal E scleral and icterus clear Neck Supple JVP flat; carotids brisk and full Clear to ausculation  *Regular rate and rhythm, no murmurs gallops or rub Soft with active bowel sounds No clubbing cyanosis  Edema Alert and oriented, grossly normal motor and sensory function Skin Warm and Dry  ECG  Sinus 100 22/10/35  Assessment and  Plan  Cryptogenic Stroke  HTN  ILR    No intercurrent atrial fibrillation  BP well controlled  Device interrogated and stable

## 2015-09-09 NOTE — Patient Instructions (Signed)
Medication Instructions: - no changes  Labwork: - none  Procedures/Testing: - none  Follow-Up: - Dr. Klein will see you back on an as needed basis.  Any Additional Special Instructions Will Be Listed Below (If Applicable). - none  

## 2015-09-09 NOTE — Telephone Encounter (Signed)
Dr. Dillard Essex called. The patient has had a decline in her functional status, she has had several falls, not able to walk, there is some question of facial droop. The patient has had a CT scan of the brain done on 09/13/2015 following a fall, no acute changes were seen. 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

## 2015-09-10 ENCOUNTER — Ambulatory Visit (INDEPENDENT_AMBULATORY_CARE_PROVIDER_SITE_OTHER): Payer: Medicare Other | Admitting: Neurology

## 2015-09-10 ENCOUNTER — Encounter: Payer: Self-pay | Admitting: Neurology

## 2015-09-10 VITALS — BP 124/71 | HR 87 | Ht 60.0 in

## 2015-09-10 DIAGNOSIS — R42 Dizziness and giddiness: Secondary | ICD-10-CM | POA: Diagnosis not present

## 2015-09-10 DIAGNOSIS — R269 Unspecified abnormalities of gait and mobility: Secondary | ICD-10-CM | POA: Diagnosis not present

## 2015-09-10 DIAGNOSIS — G20 Parkinson's disease: Secondary | ICD-10-CM

## 2015-09-10 LAB — CUP PACEART REMOTE DEVICE CHECK: Date Time Interrogation Session: 20161019223756

## 2015-09-10 NOTE — Progress Notes (Signed)
Carelink summary report received. Battery status OK. Normal device function. No new symptom, brady, pause, or AF episodes. 1 tachy episode, SVT, previously reviewed by SK, patient started on diltiazem by PCP. Monthly summary reports and ROV with SK PRN.

## 2015-09-10 NOTE — Progress Notes (Signed)
Reason for visit: Parkinson's disease  [name] is an 81 y.o. female  History of present illness:  [name] is an 81 year old right-handed white female with a history of Parkinson's disease. The patient fell on 06/05/2015, and fractured the right arm. Since that time, she has not been ambulating much. The patient was able to ambulate independently prior to this. The patient is now mainly in a wheelchair, she has not been receiving any physical therapy. She believes that her ability to walk has gradually declined since the fall in August 2016. The patient fell about 2 weeks ago, she went to the emergency room and underwent a CT scan of brain. The CT does show a moderate level of small vessel ischemic changes. The patient has had small vessel disease documented on MRI done in July 2015. The patient denies any sudden weakness or numbness on the face, arms, or legs. She may have had some slight speech alteration over time. The patient has a tendency to lean backwards when she tries to stand up. She is on Sinemet taking the 25/250 mg tablets, taking 1 tablet 3 times daily. She also takes a half of a 25/100 mg tablet 3 times a day. The patient does report some difficulty with swallowing at times. She has a baseline mild memory problem. The patient also reports some discomfort in the right knee since the fall in August 2016. She comes to this office for an evaluation.  Past Medical History  Diagnosis Date  . Parkinson disease ([place])   . Cerebrovascular disease     CVA '03  . TIA (transient ischemic attack)     Multiple  . Depression   . Dyslipidemia   . Glaucoma   . GERD (gastroesophageal reflux disease)   . Obesity   . Endometriosis   . Paralysis agitans ([place]) 08/31/2013  . Macular degeneration     bilateral  . Hypertension   . Asthma   . Anxiety   . Headache(784.0)   . Arthritis   . Abnormality of gait 02/05/2015    Past Surgical History  Procedure Laterality Date  .  Gallbladder surgery    . Abdominal hysterectomy    . Tonsillectomy    . Cataract extraction, bilateral    . Pilonidal cyst resection    . Tilt table study  05-28-14    orthostatic hypertension  . Loop recorder implant  05-28-14    MDT LINQ implanted by Dr Caryl Comes for cryptogenic stroke/palpitations  . Tilt table study N/A 05/28/2014    Procedure: TILT TABLE STUDY;  Surgeon: Deboraha Sprang, MD;  Location: [facility] CATH LAB;  Service: Cardiovascular;  Laterality: N/A;  . Loop recorder implant N/A 05/28/2014    Procedure: LOOP RECORDER IMPLANT;  Surgeon: Deboraha Sprang, MD;  Location: [facility] CATH LAB;  Service: Cardiovascular;  Laterality: N/A;    Family History  Problem Relation Age of Onset  . Arthritis Brother   . Skin cancer Brother   . Heart attack Mother     Social history:  reports that she has never smoked. She has never used smokeless tobacco. She reports that she does not drink alcohol or use illicit drugs.    Allergies  Allergen Reactions  . Comtan [Entacapone]   . Mirapex [Pramipexole Dihydrochloride]   . Sulfa Antibiotics Nausea And Vomiting  . Ciprofloxacin     Made her feel crazy    Medications:  Prior to Admission medications   Medication Sig Start Date End Date Taking?  Authorizing Provider  acetaminophen (TYLENOL) 325 MG tablet Take 650 mg by mouth every 4 (four) hours as needed for moderate pain.   Yes Historical Provider, MD  ALPRAZolam Duanne Moron) 0.5 MG tablet Take 0.5 mg by mouth 3 (three) times daily.    Yes Historical Provider, MD  alum & mag hydroxide-simeth (MINTOX) 200-200-20 MG/5ML suspension Take 30 mLs by mouth every 6 (six) hours as needed for indigestion or heartburn.   Yes Historical Provider, MD  aspirin EC 325 MG tablet Take 325 mg by mouth daily.   Yes Historical Provider, MD  Calcium Carbonate Antacid (CALCIUM ANTACID EXTRA STRENGTH PO) Take 300-600 mg by mouth daily as needed (for heartburn).   Yes Historical Provider, MD  carbidopa-levodopa (SINEMET IR)  25-100 MG tablet Take 0.5 tablets by mouth 3 (three) times daily. 08/27/15  Yes Kathrynn Ducking, MD  carbidopa-levodopa (SINEMET IR) 25-250 MG tablet Take 1 tablet by mouth 3 (three) times daily.   Yes Historical Provider, MD  Cholecalciferol (VITAMIN D-3) 5000 UNITS TABS Take 5,000 Units by mouth daily.    Yes Historical Provider, MD  clopidogrel (PLAVIX) 75 MG tablet Take 75 mg by mouth at bedtime.    Yes Historical Provider, MD  dicyclomine (BENTYL) 20 MG tablet Take 20 mg by mouth 4 (four) times daily as needed (for IBS symptoms).    Yes Historical Provider, MD  diltiazem (TIAZAC) 120 MG 24 hr capsule Take 120 mg by mouth 2 (two) times daily.   Yes Marylynn Pearson, MD  DULoxetine (CYMBALTA) 20 MG capsule Take 20 mg by mouth daily.   Yes Historical Provider, MD  fenofibrate 160 MG tablet Take 160 mg by mouth daily.   Yes Historical Provider, MD  guaiFENesin (MUCINEX) 600 MG 12 hr tablet Take 600 mg by mouth 2 (two) times daily.   Yes Historical Provider, MD  HYDROcodone-acetaminophen (NORCO/VICODIN) 5-325 MG per tablet Take 1 tablet by mouth every 6 (six) hours as needed for severe pain. Patient taking differently: Take 1 tablet by mouth 4 (four) times daily.  06/05/15  Yes Forde Dandy, MD  hydrocortisone cream (HYDROCORTISONE MAX ST) 1 % Apply 1 application topically 2 (two) times daily as needed for itching.   Yes Historical Provider, MD  loperamide (IMODIUM) 2 MG capsule Take 2 mg by mouth as needed for diarrhea or loose stools.   Yes Historical Provider, MD  losartan (COZAAR) 25 MG tablet Take 25 mg by mouth daily. 08/16/13  Yes Historical Provider, MD  montelukast (SINGULAIR) 10 MG tablet Take 10 mg by mouth daily. 08/16/13  Yes Historical Provider, MD  Multiple Vitamin (TAB-A-VITE) TABS Take 1 tablet by mouth daily.   Yes Historical Provider, MD  oxymetazoline (AFRIN) 0.05 % nasal spray Place 1 spray into both nostrils once. 08/13/15  Yes Robbie Lis, MD  pantoprazole (PROTONIX) 40 MG  tablet Take 40 mg by mouth daily.   Yes Historical Provider, MD  polyethylene glycol (MIRALAX / GLYCOLAX) packet Take 17 g by mouth daily as needed for moderate constipation.    Yes Historical Provider, MD  promethazine (PHENERGAN) 12.5 MG suppository Place 12.5 mg rectally every 6 (six) hours as needed for nausea or vomiting.   Yes Historical Provider, MD  Propylhexedrine (BENZEDREX NA) Place 1 spray into the nose every 4 (four) hours as needed (nasal congestion).    Yes Historical Provider, MD  simethicone (MYLICON) 570 MG chewable tablet Chew 125 mg by mouth every 6 (six) hours as needed for flatulence.   Yes  Historical Provider, MD  talc (ZEASORB) powder Apply 1 application topically 2 (two) times daily as needed (for irritation).   Yes Historical Provider, MD  travoprost, benzalkonium, (TRAVATAN) 0.004 % ophthalmic solution Place 1 drop into both eyes at bedtime.   Yes Historical Provider, MD    ROS:  Out of a complete 14 system review of symptoms, the patient complains only of the following symptoms, and all other reviewed systems are negative.  Difficulty swallowing, drooling Incontinence of the bladder Walking difficulty Weakness  Blood pressure 124/71, pulse 87, height 5' (1.524 m).  Physical Exam  General: The patient is alert and cooperative at the time of the examination.  Skin: 1+ edema at ankles is noted bilaterally.   Neurologic Exam  Mental status: The patient is alert and oriented x 3 at the time of the examination. The patient has apparent normal recent and remote memory, with an apparently normal attention span and concentration ability.   Cranial nerves: Facial symmetry is present. Speech is normal, no aphasia or dysarthria is noted. Extraocular movements are full. Visual fields are full.  Motor: The patient has good strength in all 4 extremities, but the right upper extremity could not be fully tested.  Sensory examination: Soft touch sensation is symmetric on  the face, arms, and legs.  Coordination: The patient has good finger-nose-finger and heel-to-shin bilaterally, with exception that the right upper extremity could not be tested.  Gait and station: The patient requires assistance with standing. Once up, she does have a tendency to lean backwards. She is able to take a few steps with assistance, gait is somewhat wide-based.  Reflexes: Deep tendon reflexes are symmetric.   Assessment/Plan:  1. Parkinson's disease  2. Gait disorder  3. Memory disorder  The patient has had a change in her underlying ability to ambulate. In part, it is felt that the change has occurred because the patient has been less active, she has not been walking on a regular basis. I see no focal findings on clinical examination that would suggest a stroke event. The patient does have small vessel disease on CT and MRI brain evaluation. She will be set up for a repeat MRI of the brain, this will be compared to July 2015. The patient will be kept on her current dose of Sinemet, and I have requested that physical therapy be started, the patient is to ambulate regularly. She will follow-up for her next scheduled revisit.   Jill Alexanders MD 09/10/2015 7:03 PM  [facility] [address] [place], [place] [address]  Phone [phone] Fax [phone]

## 2015-09-20 ENCOUNTER — Ambulatory Visit (INDEPENDENT_AMBULATORY_CARE_PROVIDER_SITE_OTHER): Payer: Medicare Other | Admitting: *Deleted

## 2015-09-20 DIAGNOSIS — R55 Syncope and collapse: Secondary | ICD-10-CM | POA: Diagnosis not present

## 2015-09-23 NOTE — Progress Notes (Signed)
LOOP RECORDER  

## 2015-09-25 ENCOUNTER — Ambulatory Visit
Admission: RE | Admit: 2015-09-25 | Discharge: 2015-09-25 | Disposition: A | Discharge status: Home / Self Care | Payer: Medicare Other | Source: Ambulatory Visit | Attending: Neurology | Admitting: Neurology

## 2015-09-25 ENCOUNTER — Telehealth: Payer: Self-pay | Admitting: Neurology

## 2015-09-25 DIAGNOSIS — R42 Dizziness and giddiness: Secondary | ICD-10-CM

## 2015-09-25 DIAGNOSIS — R269 Unspecified abnormalities of gait and mobility: Secondary | ICD-10-CM

## 2015-09-25 DIAGNOSIS — G20 Parkinson's disease: Secondary | ICD-10-CM

## 2015-09-25 NOTE — Telephone Encounter (Signed)
I called the patient. No change from last MRI. She is in PT, but she is still falling. She is to use a walker in the house.   MRI brain 09/25/15:  IMPRESSION: Abnormal MRI scan of the brain showing moderate changes of chronic microvascular ischemia and mild changes of generalized cerebral atrophy and chronic paranasal sinusitis. Overall no significant change compared with previous MRI scan dated 05/25/2014

## 2015-10-19 ENCOUNTER — Encounter (HOSPITAL_COMMUNITY): Payer: Self-pay | Admitting: Emergency Medicine

## 2015-10-19 ENCOUNTER — Emergency Department (HOSPITAL_COMMUNITY)
Admission: EM | Admit: 2015-10-19 | Discharge: 2015-10-19 | Disposition: A | Discharge status: Home / Self Care | Payer: Medicare Other | Attending: Emergency Medicine | Admitting: Emergency Medicine

## 2015-10-19 DIAGNOSIS — Z79899 Other long term (current) drug therapy: Secondary | ICD-10-CM | POA: Diagnosis not present

## 2015-10-19 DIAGNOSIS — H409 Unspecified glaucoma: Secondary | ICD-10-CM | POA: Insufficient documentation

## 2015-10-19 DIAGNOSIS — Z7982 Long term (current) use of aspirin: Secondary | ICD-10-CM | POA: Diagnosis not present

## 2015-10-19 DIAGNOSIS — E669 Obesity, unspecified: Secondary | ICD-10-CM | POA: Diagnosis not present

## 2015-10-19 DIAGNOSIS — Z8742 Personal history of other diseases of the female genital tract: Secondary | ICD-10-CM | POA: Diagnosis not present

## 2015-10-19 DIAGNOSIS — K219 Gastro-esophageal reflux disease without esophagitis: Secondary | ICD-10-CM | POA: Diagnosis not present

## 2015-10-19 DIAGNOSIS — F329 Major depressive disorder, single episode, unspecified: Secondary | ICD-10-CM | POA: Insufficient documentation

## 2015-10-19 DIAGNOSIS — G20 Parkinson's disease: Secondary | ICD-10-CM | POA: Insufficient documentation

## 2015-10-19 DIAGNOSIS — Z8673 Personal history of transient ischemic attack (TIA), and cerebral infarction without residual deficits: Secondary | ICD-10-CM | POA: Diagnosis not present

## 2015-10-19 DIAGNOSIS — Z7902 Long term (current) use of antithrombotics/antiplatelets: Secondary | ICD-10-CM | POA: Diagnosis not present

## 2015-10-19 DIAGNOSIS — M1990 Unspecified osteoarthritis, unspecified site: Secondary | ICD-10-CM | POA: Insufficient documentation

## 2015-10-19 DIAGNOSIS — R040 Epistaxis: Secondary | ICD-10-CM | POA: Diagnosis not present

## 2015-10-19 DIAGNOSIS — J45909 Unspecified asthma, uncomplicated: Secondary | ICD-10-CM | POA: Insufficient documentation

## 2015-10-19 DIAGNOSIS — F419 Anxiety disorder, unspecified: Secondary | ICD-10-CM | POA: Diagnosis not present

## 2015-10-19 DIAGNOSIS — I10 Essential (primary) hypertension: Secondary | ICD-10-CM | POA: Diagnosis not present

## 2015-10-19 MED ORDER — SALINE SPRAY 0.65 % NA SOLN
1.0000 | Freq: Once | NASAL | Status: AC
  Administered 2015-10-19: 1 via NASAL
  Filled 2015-10-19: qty 44

## 2015-10-19 NOTE — ED Notes (Addendum)
Pt from [facility] c/o nosebleed x 35 minutes. HX of nosebleeds. Currently not taking blood thinners. Last use of Plavix was about 2 weeks ago.

## 2015-10-19 NOTE — ED Provider Notes (Signed)
CSN: [id number]     Arrival date & time 10/19/15  1539 History   First MD Initiated Contact with Patient 10/19/15 1550     Chief Complaint  Patient presents with  . Epistaxis     (Consider location/radiation/quality/duration/timing/severity/associated sxs/prior Treatment) HPI   [name] is a 81 y.o. female, with a history of CVA and Parkinson's, presenting to the ED with epistaxis for the past hour. Pt states she has had 3 episodes of epistaxis over the past three weeks. Patient states that the last time she was here she required cauterization. Pt uses afrin, which eventually stops the bleeding. Pt is not currently bleeding and denies pain. Pt denies digital trauma, falls, headaches, dizziness, N/V, difficulty swallowing, shortness of breath, or any other complaints. Pt states that she has been taking plavix and ASA, which she has not taken off of. Patient's neurologist is Floyde Parkins has been notified by the patient, but Dr. Jannifer Franklin told the patient that her risk of having a stroke is greater than the risk from the nosebleeds. Patient is accompanied by her son at the bedside.   Past Medical History  Diagnosis Date  . Parkinson disease ([place])   . Cerebrovascular disease     CVA '03  . TIA (transient ischemic attack)     Multiple  . Depression   . Dyslipidemia   . Glaucoma   . GERD (gastroesophageal reflux disease)   . Obesity   . Endometriosis   . Paralysis agitans ([place]) 08/31/2013  . Macular degeneration     bilateral  . Hypertension   . Asthma   . Anxiety   . Headache(784.0)   . Arthritis   . Abnormality of gait 02/05/2015   Past Surgical History  Procedure Laterality Date  . Gallbladder surgery    . Abdominal hysterectomy    . Tonsillectomy    . Cataract extraction, bilateral    . Pilonidal cyst resection    . Tilt table study  05-28-14    orthostatic hypertension  . Loop recorder implant  05-28-14    MDT LINQ implanted by Dr Caryl Comes for cryptogenic  stroke/palpitations  . Tilt table study N/A 05/28/2014    Procedure: TILT TABLE STUDY;  Surgeon: Deboraha Sprang, MD;  Location[facility] C[facility] LAB;  Service: Cardiovascular;  Laterality: N/A;  . Loop recorder implant N/A 05/28/2014    Procedure: LOOP RECORDER IMPLANT;  Surgeon: Deboraha Sprang, MD;  Location: [facility] CATH LAB;  Service: Cardiovascular;  Laterality: N/A;   Family History  Problem Relation Age of Onset  . Arthritis Brother   . Skin cancer Brother   . Heart attack Mother    Social History  Substance Use Topics  . Smoking status: Never Smoker   . Smokeless tobacco: Never Used  . Alcohol Use: No   OB History    No data available     Review of Systems  HENT: Positive for nosebleeds.   All other systems reviewed and are negative.     Allergies  Comtan; Mirapex; Sulfa antibiotics; and Ciprofloxacin  Home Medications   Prior to Admission medications   Medication Sig Start Date End Date Taking? Authorizing Provider  acetaminophen (TYLENOL) 325 MG tablet Take 650 mg by mouth every 4 (four) hours as needed for moderate pain.    Historical Provider, MD  ALPRAZolam Duanne Moron) 0.5 MG tablet Take 0.5 mg by mouth 3 (three) times daily.     Historical Provider, MD  alum & mag hydroxide-simeth ([place]) 200-200-20 MG/5ML suspension  Take 30 mLs by mouth every 6 (six) hours as needed for indigestion or heartburn.    Historical Provider, MD  aspirin EC 325 MG tablet Take 325 mg by mouth daily.    Historical Provider, MD  Calcium Carbonate Antacid (CALCIUM ANTACID EXTRA STRENGTH PO) Take 300-600 mg by mouth daily as needed (for heartburn).    Historical Provider, MD  carbidopa-levodopa (SINEMET IR) 25-100 MG tablet Take 0.5 tablets by mouth 3 (three) times daily. 08/27/15   Kathrynn Ducking, MD  carbidopa-levodopa (SINEMET IR) 25-250 MG tablet Take 1 tablet by mouth 3 (three) times daily.    Historical Provider, MD  Cholecalciferol (VITAMIN D-3) 5000 UNITS TABS Take 5,000 Units by mouth daily.      Historical Provider, MD  clopidogrel (PLAVIX) 75 MG tablet Take 75 mg by mouth at bedtime.     Historical Provider, MD  dicyclomine (BENTYL) 20 MG tablet Take 20 mg by mouth 4 (four) times daily as needed (for IBS symptoms).     Historical Provider, MD  diltiazem (TIAZAC) 120 MG 24 hr capsule Take 120 mg by mouth 2 (two) times daily.    Marylynn Pearson, MD  DULoxetine (CYMBALTA) 20 MG capsule Take 20 mg by mouth daily.    Historical Provider, MD  fenofibrate 160 MG tablet Take 160 mg by mouth daily.    Historical Provider, MD  guaiFENesin (MUCINEX) 600 MG 12 hr tablet Take 600 mg by mouth 2 (two) times daily.    Historical Provider, MD  HYDROcodone-acetaminophen (NORCO/VICODIN) 5-325 MG per tablet Take 1 tablet by mouth every 6 (six) hours as needed for severe pain. Patient taking differently: Take 1 tablet by mouth 4 (four) times daily.  06/05/15   Forde Dandy, MD  hydrocortisone cream (HYDROCORTISONE MAX ST) 1 % Apply 1 application topically 2 (two) times daily as needed for itching.    Historical Provider, MD  loperamide (IMODIUM) 2 MG capsule Take 2 mg by mouth as needed for diarrhea or loose stools.    Historical Provider, MD  losartan (COZAAR) 25 MG tablet Take 25 mg by mouth daily. 08/16/13   Historical Provider, MD  montelukast (SINGULAIR) 10 MG tablet Take 10 mg by mouth daily. 08/16/13   Historical Provider, MD  Multiple Vitamin (TAB-A-VITE) TABS Take 1 tablet by mouth daily.    Historical Provider, MD  oxymetazoline (AFRIN) 0.05 % nasal spray Place 1 spray into both nostrils once. 08/13/15   Robbie Lis, MD  pantoprazole (PROTONIX) 40 MG tablet Take 40 mg by mouth daily.    Historical Provider, MD  polyethylene glycol (MIRALAX / GLYCOLAX) packet Take 17 g by mouth daily as needed for moderate constipation.     Historical Provider, MD  promethazine (PHENERGAN) 12.5 MG suppository Place 12.5 mg rectally every 6 (six) hours as needed for nausea or vomiting.    Historical Provider, MD   Propylhexedrine [facility] NA) Place 1 spray into the nose every 4 (four) hours as needed (nasal congestion).     Historical Provider, MD  simethicone (MYLICON) 0000000 MG chewable tablet Chew 125 mg by mouth every 6 (six) hours as needed for flatulence.    Historical Provider, MD  talc (ZEASORB) powder Apply 1 application topically 2 (two) times daily as needed (for irritation).    Historical Provider, MD  travoprost, benzalkonium, (TRAVATAN) 0.004 % ophthalmic solution Place 1 drop into both eyes at bedtime.    Historical Provider, MD   BP 139/76 mmHg  Pulse 88  Temp(Src) 97.6 F (  36.4 C) (Oral)  Resp 20  SpO2 96% Physical Exam  Constitutional: She appears well-developed and well-nourished. No distress.  HENT:  Head: Normocephalic and atraumatic.  Scant dried blood in left nare. Both nares patent. No active bleeding.  Eyes: Conjunctivae are normal. Pupils are equal, round, and reactive to light.  Cardiovascular: Normal rate, regular rhythm and normal heart sounds.   Pulmonary/Chest: Effort normal and breath sounds normal. No respiratory distress.  Abdominal: Soft. Bowel sounds are normal.  Musculoskeletal: She exhibits no edema or tenderness.  Neurological: She is alert.  Skin: Skin is warm and dry. She is not diaphoretic.  Nursing note and vitals reviewed.   ED Course  Procedures (including critical care time) Labs Review Labs Reviewed - No data to display  Imaging Review No results found. I have personally reviewed and evaluated these images and lab results as part of my medical decision-making.   EKG Interpretation None      MDM   Final diagnoses:  Epistaxis, recurrent    LEMYA STDENIS presents with nosebleeds while on Plavix.  Findings and plan of care discussed with Daleen Bo, MD.  Patient is frustrated at this point that she continues to have nosebleeds. Patient made the statement that she would like to just stop the Plavix. It was explained to the  patient that if her neurologist is keeping her on the Plavix despite his knowledge of her nosebleeds and means that her risk of stroke is greater than the risk from epistaxis Pt is normotensive, atraumatic, nontoxic appearing, and is in no apparent distress. Imaging and labs are not indicated at this time. Recommend that the patient should be using saline nasal spray and Vaseline daily. Patient was also taught technique for applying pressure to slow bleeding. Patient voiced understanding of these instructions and agreed to the plan. 5:38 PM Upon reevaluation patient has no additional complaints and remains hemorrhage free. Patient stated that she would talk with her PCP and her neurologist to see if they can adjust the Plavix dose. Patient was also given instructions for techniques to stop active nosebleeds including applying pressure and ice.  Lorayne Bender, PA-C 10/19/15 [place], MD 10/20/15 0010

## 2015-10-19 NOTE — Discharge Instructions (Signed)
You have been seen today for nosebleed. Follow up with PCP as needed for chronic management of this issue. Return to ED should symptoms worsen. Use saline nasal spray with one spray in each nostril up to 3 times a day. Use Vaseline in both nares to increase moisturization. When you have a nosebleed, apply pressure to the nose with two fingers as if you are trying to plug your nose. Keep this pressure for about an hour. You may also apply ice to the area.

## 2015-10-19 NOTE — ED Notes (Signed)
Pt and son educated on how to stop a nose bleed at home and instructed if bleeding does not stop after one hour please come back. Pt was also educated on vaseline use and nasal saline use. Both verbalize understanding.

## 2015-10-19 NOTE — ED Provider Notes (Signed)
  Face-to-face evaluation   History: Patient here for evaluation of nosebleed. She has had several episodes in the last couple months. She denies headache, fever, chills, nausea, vomiting, significant rhinorrhea or dizziness.  Physical exam: Elderly alert female who appears comfortable. Nares are dry bilaterally. Small amount of blood in the right nares. No deformity of the nose.  I discussed findings with the patient and her son, including ways to prevent nose bleeding, with nasal hydration, Vaseline application, and pressure on Kiesselbach's plexus, if she has further episodes of nose bleeding  Medical screening examination/treatment/procedure(s) were conducted as a shared visit with non-physician practitioner(s) and myself.  I personally evaluated the patient during the encounter  Daleen Bo, MD 10/20/15 0010

## 2015-10-19 NOTE — ED Notes (Signed)
PA at bedside.

## 2015-10-19 NOTE — ED Notes (Signed)
Bed: [id number] Expected date: 10/19/15 Expected time: 3:40 PM Means of arrival:  Comments: Nosebleed

## 2015-10-21 LAB — CUP PACEART REMOTE DEVICE CHECK: Date Time Interrogation Session: 20161118230619

## 2015-10-22 ENCOUNTER — Ambulatory Visit (INDEPENDENT_AMBULATORY_CARE_PROVIDER_SITE_OTHER): Payer: Medicare Other | Admitting: *Deleted

## 2015-10-22 DIAGNOSIS — R55 Syncope and collapse: Secondary | ICD-10-CM | POA: Diagnosis not present

## 2015-10-22 NOTE — Progress Notes (Signed)
Carelink Summary Report / Loop Recorder 

## 2015-11-07 ENCOUNTER — Encounter (HOSPITAL_COMMUNITY): Payer: Self-pay | Admitting: Emergency Medicine

## 2015-11-07 ENCOUNTER — Emergency Department (HOSPITAL_COMMUNITY): Payer: Medicare Other

## 2015-11-07 ENCOUNTER — Emergency Department (HOSPITAL_COMMUNITY)
Admission: EM | Admit: 2015-11-07 | Discharge: 2015-11-07 | Disposition: A | Discharge status: Home / Self Care | Payer: Medicare Other | Attending: Emergency Medicine | Admitting: Emergency Medicine

## 2015-11-07 DIAGNOSIS — Z7982 Long term (current) use of aspirin: Secondary | ICD-10-CM | POA: Insufficient documentation

## 2015-11-07 DIAGNOSIS — I10 Essential (primary) hypertension: Secondary | ICD-10-CM | POA: Insufficient documentation

## 2015-11-07 DIAGNOSIS — G20 Parkinson's disease: Secondary | ICD-10-CM | POA: Insufficient documentation

## 2015-11-07 DIAGNOSIS — K219 Gastro-esophageal reflux disease without esophagitis: Secondary | ICD-10-CM | POA: Diagnosis not present

## 2015-11-07 DIAGNOSIS — Z8742 Personal history of other diseases of the female genital tract: Secondary | ICD-10-CM | POA: Diagnosis not present

## 2015-11-07 DIAGNOSIS — Z7902 Long term (current) use of antithrombotics/antiplatelets: Secondary | ICD-10-CM | POA: Diagnosis not present

## 2015-11-07 DIAGNOSIS — M1990 Unspecified osteoarthritis, unspecified site: Secondary | ICD-10-CM | POA: Insufficient documentation

## 2015-11-07 DIAGNOSIS — Y9289 Other specified places as the place of occurrence of the external cause: Secondary | ICD-10-CM | POA: Diagnosis not present

## 2015-11-07 DIAGNOSIS — S42294K Other nondisplaced fracture of upper end of right humerus, subsequent encounter for fracture with nonunion: Secondary | ICD-10-CM | POA: Insufficient documentation

## 2015-11-07 DIAGNOSIS — S52691A Other fracture of lower end of right ulna, initial encounter for closed fracture: Secondary | ICD-10-CM | POA: Diagnosis not present

## 2015-11-07 DIAGNOSIS — F419 Anxiety disorder, unspecified: Secondary | ICD-10-CM | POA: Insufficient documentation

## 2015-11-07 DIAGNOSIS — Z8673 Personal history of transient ischemic attack (TIA), and cerebral infarction without residual deficits: Secondary | ICD-10-CM | POA: Diagnosis not present

## 2015-11-07 DIAGNOSIS — S4991XA Unspecified injury of right shoulder and upper arm, initial encounter: Secondary | ICD-10-CM | POA: Diagnosis present

## 2015-11-07 DIAGNOSIS — S62101A Fracture of unspecified carpal bone, right wrist, initial encounter for closed fracture: Secondary | ICD-10-CM

## 2015-11-07 DIAGNOSIS — S52571A Other intraarticular fracture of lower end of right radius, initial encounter for closed fracture: Secondary | ICD-10-CM | POA: Insufficient documentation

## 2015-11-07 DIAGNOSIS — Z79899 Other long term (current) drug therapy: Secondary | ICD-10-CM | POA: Insufficient documentation

## 2015-11-07 DIAGNOSIS — J45909 Unspecified asthma, uncomplicated: Secondary | ICD-10-CM | POA: Diagnosis not present

## 2015-11-07 DIAGNOSIS — F329 Major depressive disorder, single episode, unspecified: Secondary | ICD-10-CM | POA: Diagnosis not present

## 2015-11-07 DIAGNOSIS — Y998 Other external cause status: Secondary | ICD-10-CM | POA: Insufficient documentation

## 2015-11-07 DIAGNOSIS — S42301K Unspecified fracture of shaft of humerus, right arm, subsequent encounter for fracture with nonunion: Secondary | ICD-10-CM

## 2015-11-07 DIAGNOSIS — H409 Unspecified glaucoma: Secondary | ICD-10-CM | POA: Insufficient documentation

## 2015-11-07 DIAGNOSIS — W010XXA Fall on same level from slipping, tripping and stumbling without subsequent striking against object, initial encounter: Secondary | ICD-10-CM | POA: Insufficient documentation

## 2015-11-07 DIAGNOSIS — Y9389 Activity, other specified: Secondary | ICD-10-CM | POA: Diagnosis not present

## 2015-11-07 DIAGNOSIS — E669 Obesity, unspecified: Secondary | ICD-10-CM | POA: Insufficient documentation

## 2015-11-07 MED ORDER — HYDROCODONE-ACETAMINOPHEN 5-325 MG PO TABS: 1.0000 | ORAL_TABLET | ORAL | Status: DC | PRN

## 2015-11-07 MED ORDER — HYDROCODONE-ACETAMINOPHEN 5-325 MG PO TABS
1.0000 | ORAL_TABLET | Freq: Once | ORAL | Status: AC
  Administered 2015-11-07: 1 via ORAL
  Filled 2015-11-07: qty 1

## 2015-11-07 NOTE — ED Notes (Signed)
[facility] presents with a 82 yo female from [facility] [place] facility with a fall and right arm/wrist pain.  Pt was attempting to transfer from walker to recliner when she fell with her weight coming down on her right arm/wrist in an attempt to catch herself.  Pt has a previous right humerus fracture (August 2016).  Pt states they did not do surgery and was trying to let the area heal on its own but there is obvious deformity in this area from the fall in August.  Possible deformity to right wrist area.  Denies any other injury/pain including head and neck.  Pt is on Plavix currently and received 100 mcg of fentanyl in route to this facility.  NSR.  VS stable.

## 2015-11-07 NOTE — ED Provider Notes (Signed)
CSN: [id number]     Arrival date & time 11/07/15  1511 History   First MD Initiated Contact with Patient 11/07/15 1557     Chief Complaint  Patient presents with  . Fall  . Arm Pain     (Consider location/radiation/quality/duration/timing/severity/associated sxs/prior Treatment) Patient is a 82 y.o. female presenting with arm injury.  Arm Injury Pain details:    Quality:  Sharp and shooting   Radiates to:  R arm   Severity:  Mild   Onset quality:  Gradual   Timing:  Constant   Progression:  Worsening Chronicity:  New Handedness:  Right-handed Dislocation: no   Associated symptoms: no back pain, no fatigue, no fever and no neck pain     Past Medical History  Diagnosis Date  . Parkinson disease ([place])   . Cerebrovascular disease     CVA '03  . TIA (transient ischemic attack)     Multiple  . Depression   . Dyslipidemia   . Glaucoma   . GERD (gastroesophageal reflux disease)   . Obesity   . Endometriosis   . Paralysis agitans ([place]) 08/31/2013  . Macular degeneration     bilateral  . Hypertension   . Asthma   . Anxiety   . Headache(784.0)   . Arthritis   . Abnormality of gait 02/05/2015   Past Surgical History  Procedure Laterality Date  . Gallbladder surgery    . Abdominal hysterectomy    . Tonsillectomy    . Cataract extraction, bilateral    . Pilonidal cyst resection    . Tilt table study  05-28-14    orthostatic hypertension  . Loop recorder implant  05-28-14    MDT LINQ implanted by Dr Caryl Comes for cryptogenic stroke/palpitations  . Tilt table study N/A 05/28/2014    Procedure: TILT TABLE STUDY;  Surgeon: Deboraha Sprang, MD;  Location: [facility] CATH LAB;  Service: Cardiovascular;  Laterality: N/A;  . Loop recorder implant N/A 05/28/2014    Procedure: LOOP RECORDER IMPLANT;  Surgeon: Deboraha Sprang, MD;  Location: [facility] CATH LAB;  Service: Cardiovascular;  Laterality: N/A;   Family History  Problem Relation Age of Onset  . Arthritis Brother   . Skin cancer Brother   .  Heart attack Mother    Social History  Substance Use Topics  . Smoking status: Never Smoker   . Smokeless tobacco: Never Used  . Alcohol Use: No   OB History    No data available     Review of Systems  Constitutional: Negative for fever and fatigue.  Eyes: Negative for pain and redness.  Respiratory: Negative for cough and shortness of breath.   Gastrointestinal: Negative for nausea and vomiting.  Genitourinary: Negative for vaginal pain and pelvic pain.  Musculoskeletal: Negative for back pain, joint swelling and neck pain.       Right wrist pain  All other systems reviewed and are negative.     Allergies  Comtan; Mirapex; Sulfa antibiotics; and Ciprofloxacin  Home Medications   Prior to Admission medications   Medication Sig Start Date End Date Taking? Authorizing Provider  acetaminophen (TYLENOL) 325 MG tablet Take 650 mg by mouth every 4 (four) hours as needed for moderate pain.    Historical Provider, MD  ALPRAZolam Duanne Moron) 0.5 MG tablet Take 0.5 mg by mouth 3 (three) times daily.     Historical Provider, MD  alum & mag hydroxide-simeth ([place]) 200-200-20 MG/5ML suspension Take 30 mLs by mouth every 6 (six) hours as needed  for indigestion or heartburn.    Historical Provider, MD  aspirin EC 325 MG tablet Take 325 mg by mouth daily.    Historical Provider, MD  Calcium Carbonate Antacid (CALCIUM ANTACID EXTRA STRENGTH PO) Take 300-600 mg by mouth daily as needed (for heartburn).    Historical Provider, MD  carbidopa-levodopa (SINEMET IR) 25-100 MG tablet Take 0.5 tablets by mouth 3 (three) times daily. 08/27/15   Kathrynn Ducking, MD  carbidopa-levodopa (SINEMET IR) 25-250 MG tablet Take 1 tablet by mouth 3 (three) times daily.    Historical Provider, MD  Cholecalciferol (VITAMIN D-3) 5000 UNITS TABS Take 5,000 Units by mouth daily.     Historical Provider, MD  clopidogrel (PLAVIX) 75 MG tablet Take 75 mg by mouth at bedtime.     Historical Provider, MD  dicyclomine  (BENTYL) 20 MG tablet Take 20 mg by mouth 4 (four) times daily as needed (for IBS symptoms).     Historical Provider, MD  diltiazem (TIAZAC) 120 MG 24 hr capsule Take 120 mg by mouth 2 (two) times daily.    Marylynn Pearson, MD  DULoxetine (CYMBALTA) 20 MG capsule Take 20 mg by mouth daily.    Historical Provider, MD  fenofibrate 160 MG tablet Take 160 mg by mouth daily.    Historical Provider, MD  guaiFENesin (MUCINEX) 600 MG 12 hr tablet Take 600 mg by mouth 2 (two) times daily.    Historical Provider, MD  HYDROcodone-acetaminophen (NORCO/VICODIN) 5-325 MG tablet Take 1 tablet by mouth every 4 (four) hours as needed for severe pain. 11/07/15   Merrily Pew, MD  hydrocortisone cream (HYDROCORTISONE MAX ST) 1 % Apply 1 application topically 2 (two) times daily as needed for itching.    Historical Provider, MD  loperamide (IMODIUM) 2 MG capsule Take 2 mg by mouth as needed for diarrhea or loose stools.    Historical Provider, MD  losartan (COZAAR) 25 MG tablet Take 25 mg by mouth daily. 08/16/13   Historical Provider, MD  montelukast (SINGULAIR) 10 MG tablet Take 10 mg by mouth daily. 08/16/13   Historical Provider, MD  Multiple Vitamin (TAB-A-VITE) TABS Take 1 tablet by mouth daily.    Historical Provider, MD  oxymetazoline (AFRIN) 0.05 % nasal spray Place 1 spray into both nostrils once. 08/13/15   Robbie Lis, MD  pantoprazole (PROTONIX) 40 MG tablet Take 40 mg by mouth daily.    Historical Provider, MD  polyethylene glycol (MIRALAX / GLYCOLAX) packet Take 17 g by mouth daily as needed for moderate constipation.     Historical Provider, MD  promethazine (PHENERGAN) 12.5 MG suppository Place 12.5 mg rectally every 6 (six) hours as needed for nausea or vomiting.    Historical Provider, MD  Propylhexedrine [facility] NA) Place 1 spray into the nose every 4 (four) hours as needed (nasal congestion).     Historical Provider, MD  simethicone (MYLICON) 0000000 MG chewable tablet Chew 125 mg by mouth every 6  (six) hours as needed for flatulence.    Historical Provider, MD  talc (ZEASORB) powder Apply 1 application topically 2 (two) times daily as needed (for irritation).    Historical Provider, MD  travoprost, benzalkonium, (TRAVATAN) 0.004 % ophthalmic solution Place 1 drop into both eyes at bedtime.    Historical Provider, MD   BP 156/74 mmHg  Pulse 87  Temp(Src) 98.3 F (36.8 C) (Oral)  Resp 17  SpO2 94% Physical Exam  Constitutional: She is oriented to person, place, and time. She appears well-developed and well-nourished.  HENT:  Head: Normocephalic and atraumatic.  Neck: Normal range of motion.  Cardiovascular: Normal rate and regular rhythm.   Pulmonary/Chest: Effort normal. No stridor. No respiratory distress. She has no wheezes. She has no rales.  Abdominal: Soft. She exhibits no distension. There is no tenderness.  Musculoskeletal: Normal range of motion. She exhibits tenderness (right wrist). She exhibits no edema.  Neurological: She is alert and oriented to person, place, and time. No cranial nerve deficit. Coordination normal.  Nursing note and vitals reviewed.   ED Course  Procedures (including critical care time) Labs Review Labs Reviewed - No data to display  Imaging Review Dg Shoulder Right  11/07/2015  CLINICAL DATA:  [place] when attempting to transfer her walker to water, came down on RIGHT arm/wrist when trying to catch herself, prior RIGHT humeral fracture August 2016 EXAM: RIGHT SHOULDER - 2+ VIEW COMPARISON:  06/05/2015 FINDINGS: Osseous demineralization. AC joint alignment normal. Glenohumeral joint alignment normal. Deformity and periosteal new bone identified at proximal RIGHT humerus secondary to humeral fracture as seen on prior radiographs. However, in addition, new angular deformity of the proximal humerus versus previous exam. The margins of the fragments are slightly indistinct suggesting an old. This may represent sequela of previously identified displaced  proximal humeral fracture though it is difficult to exclude acute displacement through the previously identified healing fracture. Scapula and visualized RIGHT ribs appear intact. IMPRESSION: Healing proximal RIGHT humeral fracture is identified as noted on prior exam from August 2016. However, new angular deformity is identified, question sequela of prior fracture versus recurrent fracture with displacement through the previously identified healing fracture. Electronically Signed   By: Lavonia Dana M.D.   On: 11/07/2015 17:26   Dg Wrist Complete Right  11/07/2015  CLINICAL DATA:  Status post fall today with pain in the right wrist. EXAM: RIGHT WRIST - COMPLETE 3+ VIEW COMPARISON:  None. FINDINGS: There is intra-articular nondisplaced fracture of distal radius. There is minimal displaced fracture of the distal ulna shaft. IMPRESSION: Fractures of the distal ulna and radius. Electronically Signed   By: Abelardo Diesel M.D.   On: 11/07/2015 16:09   Dg Humerus Right  11/07/2015  CLINICAL DATA:  82 year old who fell while trying to transfer from her walker to a recliner, injuring the right alarm. Initial encounter. Prior right humerus fracture in August, 2016. EXAM: RIGHT HUMERUS - 2+ VIEW COMPARISON:  06/05/2015. FINDINGS: Severely comminuted fracture involving the proximal humeral metaphysis with further angulation of the distal fragment currently and little or no interval healing. No evidence of a new fracture involving the remainder of the humerus. Glenohumeral joint intact. Visualized elbow joint intact. Generalized osseous demineralization as noted previously. IMPRESSION: 1. Severely comminuted proximal humeral fracture with further angulation of the distal fragment on today's examination when compared to the 06/05/2015 examination, likely due to acute injury. 2. Little or no interval healing of the proximal humeral fracture since 06/05/2015. 3. No new fractures elsewhere involving the humerus. Electronically  Signed   By: Evangeline Dakin M.D.   On: 11/07/2015 17:23   I have personally reviewed and evaluated these images and lab results as part of my medical decision-making.   EKG Interpretation None      MDM   Final diagnoses:  Wrist fracture, right, closed, initial encounter  Humerus fracture, right, with nonunion, subsequent encounter   82 yo F w/ mechanical fall and right wrist pain. H/o humeral fracture that was non-op. Here with radius/ulna fracture. D/W on call for Dr. Tamera Punt, [facility]  orthopedics and will follow in the office. Long arm splint applied. NVI.       Merrily Pew, MD 11/08/15 [id number]

## 2015-11-18 ENCOUNTER — Ambulatory Visit (INDEPENDENT_AMBULATORY_CARE_PROVIDER_SITE_OTHER): Payer: Medicare Other | Admitting: Emergency Medicine

## 2015-11-18 VITALS — BP 124/73 | HR 91 | Temp 98.1°F | Resp 20

## 2015-11-18 DIAGNOSIS — R071 Chest pain on breathing: Secondary | ICD-10-CM

## 2015-11-18 NOTE — Patient Instructions (Signed)
Chest Contusion A chest contusion is a deep bruise on your chest area. Contusions are the result of an injury that caused bleeding under the skin. A chest contusion may involve bruising of the skin, muscles, or ribs. The contusion may turn blue, purple, or yellow. Minor injuries will give you a painless contusion, but more severe contusions may stay painful and swollen for a few weeks. CAUSES  A contusion is usually caused by a blow, trauma, or direct force to an area of the body. SYMPTOMS   Swelling and redness of the injured area.  Discoloration of the injured area.  Tenderness and soreness of the injured area.  Pain. DIAGNOSIS  The diagnosis can be made by taking a history and performing a physical exam. An X-ray, CT scan, or MRI may be needed to determine if there were any associated injuries, such as broken bones (fractures) or internal injuries. TREATMENT  Often, the best treatment for a chest contusion is resting, icing, and applying cold compresses to the injured area. Deep breathing exercises may be recommended to reduce the risk of pneumonia. Over-the-counter medicines may also be recommended for pain control. HOME CARE INSTRUCTIONS   Put ice on the injured area.  Put ice in a plastic bag.  Place a towel between your skin and the bag.  Leave the ice on for 15-20 minutes, 03-04 times a day.  Only take over-the-counter or prescription medicines as directed by your caregiver. Your caregiver may recommend avoiding anti-inflammatory medicines (aspirin, ibuprofen, and naproxen) for 48 hours because these medicines may increase bruising.  Rest the injured area.  Perform deep-breathing exercises as directed by your caregiver.  Stop smoking if you smoke.  Do not lift objects over 5 pounds (2.3 kg) for 3 days or longer if recommended by your caregiver. SEEK IMMEDIATE MEDICAL CARE IF:   You have increased bruising or swelling.  You have pain that is getting worse.  You have  difficulty breathing.  You have dizziness, weakness, or fainting.  You have blood in your urine or stool.  You cough up or vomit blood.  Your swelling or pain is not relieved with medicines. MAKE SURE YOU:   Understand these instructions.  Will watch your condition.  Will get help right away if you are not doing well or get worse.   This information is not intended to replace advice given to you by your health care provider. Make sure you discuss any questions you have with your health care provider.   Document Released: 07/14/2001 Document Revised: 07/13/2012 Document Reviewed: 04/11/2012 Elsevier Interactive Patient Education 2016 Elsevier Inc.  

## 2015-11-18 NOTE — Progress Notes (Signed)
Subjective:  Patient ID: [name], female    DOB: Jul 21, 1930  Age: 82 y.o. MRN: [id number]  CC: Other   HPI [name] presents   Patient has history of multiple falls. Yesterday she was being wheeled around a quarter by her husband in a wheelchair and went around a corner little too fast and she fell out of the wheelchair landing on her right side. She had a broken right wrist is in a cast. Now she has pain in her right lateral chest wall. Pain is pleuritic in nature. She has no breath or hemoptysis. She has no history of head injury neck or back pain. She has no nausea vomiting or stool abdominal pain.  History [name] has a past medical history of Parkinson disease ([place]); Cerebrovascular disease; TIA (transient ischemic attack); Depression; Dyslipidemia; Glaucoma; GERD (gastroesophageal reflux disease); Obesity; Endometriosis; Paralysis agitans ([place]) (08/31/2013); Macular degeneration; Hypertension; Asthma; Anxiety; Headache(784.0); Arthritis; and Abnormality of gait (02/05/2015).   She has past surgical history that includes Gallbladder surgery; Abdominal hysterectomy; Tonsillectomy; Cataract extraction, bilateral; PILONIDAL CYST RESECTION; Tilt table study (05-28-14); Loop recorder implant (05-28-14); tilt table study (N/A, 05/28/2014); and loop recorder implant (N/A, 05/28/2014).   Her  family history includes Arthritis in her brother; Heart attack in her mother; Skin cancer in her brother.  She   reports that she has never smoked. She has never used smokeless tobacco. She reports that she does not drink alcohol or use illicit drugs.  Outpatient Prescriptions Prior to Visit  Medication Sig Dispense Refill  . acetaminophen (TYLENOL) 325 MG tablet Take 650 mg by mouth every 4 (four) hours as needed for moderate pain.    Marland Kitchen ALPRAZolam (XANAX) 0.5 MG tablet Take 0.5 mg by mouth 3 (three) times daily.     Marland Kitchen aspirin EC 325 MG tablet Take 325 mg by mouth daily.    . Calcium  Carbonate Antacid (CALCIUM ANTACID EXTRA STRENGTH PO) Take 300-600 mg by mouth daily as needed (for heartburn).    . carbidopa-levodopa (SINEMET IR) 25-100 MG tablet Take 0.5 tablets by mouth 3 (three) times daily. 50 tablet 1  . Cholecalciferol (VITAMIN D-3) 5000 UNITS TABS Take 5,000 Units by mouth daily.     . clopidogrel (PLAVIX) 75 MG tablet Take 75 mg by mouth at bedtime.     . dicyclomine (BENTYL) 20 MG tablet Take 20 mg by mouth 4 (four) times daily as needed (for IBS symptoms).     Marland Kitchen diltiazem (TIAZAC) 120 MG 24 hr capsule Take 120 mg by mouth 2 (two) times daily.    . DULoxetine (CYMBALTA) 20 MG capsule Take 20 mg by mouth daily.    . fenofibrate 160 MG tablet Take 160 mg by mouth daily.    Marland Kitchen guaiFENesin (MUCINEX) 600 MG 12 hr tablet Take 600 mg by mouth 2 (two) times daily.    Marland Kitchen HYDROcodone-acetaminophen (NORCO/VICODIN) 5-325 MG tablet Take 1 tablet by mouth every 4 (four) hours as needed for severe pain. 30 tablet 0  . hydrocortisone cream (HYDROCORTISONE MAX ST) 1 % Apply 1 application topically 2 (two) times daily as needed for itching.    . loperamide (IMODIUM) 2 MG capsule Take 2 mg by mouth as needed for diarrhea or loose stools.    Marland Kitchen losartan (COZAAR) 25 MG tablet Take 25 mg by mouth daily.    . montelukast (SINGULAIR) 10 MG tablet Take 10 mg by mouth daily.    . Multiple Vitamin (TAB-A-VITE) TABS Take 1 tablet by  mouth daily.    Marland Kitchen oxymetazoline (AFRIN) 0.05 % nasal spray Place 1 spray into both nostrils once. 30 mL 0  . pantoprazole (PROTONIX) 40 MG tablet Take 40 mg by mouth daily.    . polyethylene glycol (MIRALAX / GLYCOLAX) packet Take 17 g by mouth daily as needed for moderate constipation.     . promethazine (PHENERGAN) 12.5 MG suppository Place 12.5 mg rectally every 6 (six) hours as needed for nausea or vomiting.    Marland Kitchen Propylhexedrine (BENZEDREX NA) Place 1 spray into the nose every 4 (four) hours as needed (nasal congestion).     . simethicone (MYLICON) 0000000 MG chewable  tablet Chew 125 mg by mouth every 6 (six) hours as needed for flatulence.    . talc (ZEASORB) powder Apply 1 application topically 2 (two) times daily as needed (for irritation).    . travoprost, benzalkonium, (TRAVATAN) 0.004 % ophthalmic solution Place 1 drop into both eyes at bedtime.    Marland Kitchen alum & mag hydroxide-simeth (MINTOX) [id number] MG/5ML suspension Take 30 mLs by mouth every 6 (six) hours as needed for indigestion or heartburn. Reported on 11/18/2015    . carbidopa-levodopa (SINEMET IR) 25-250 MG tablet Take 1 tablet by mouth 3 (three) times daily.     No facility-administered medications prior to visit.    Social History   Social History  . Marital Status: Married    Spouse Name: N/A  . Number of Children: 4  . Years of Education: HS   Occupational History  . Retired    Social History Main Topics  . Smoking status: Never Smoker   . Smokeless tobacco: Never Used  . Alcohol Use: No  . Drug Use: No  . Sexual Activity: No   Other Topics Concern  . None   Social History Narrative   Patient is right handed.   Patient does not drink caffeine.     Review of Systems  Constitutional: Negative for fever, chills and appetite change.  HENT: Negative for congestion, ear pain, postnasal drip, sinus pressure and sore throat.   Eyes: Negative for pain and redness.  Respiratory: Negative for cough, shortness of breath and wheezing.   Cardiovascular: Positive for chest pain. Negative for leg swelling.  Gastrointestinal: Negative for nausea, vomiting, abdominal pain, diarrhea, constipation and blood in stool.  Endocrine: Negative for polyuria.  Genitourinary: Negative for dysuria, urgency, frequency and flank pain.  Musculoskeletal: Negative for gait problem.  Skin: Negative for rash.  Neurological: Negative for weakness and headaches.  Psychiatric/Behavioral: Negative for confusion and decreased concentration. The patient is not nervous/anxious.     Objective:  BP 124/73  mmHg  Pulse 91  Temp(Src) 98.1 F (36.7 C) (Oral)  Resp 20  SpO2 96%  Physical Exam  Constitutional: She is oriented to person, place, and time. She appears well-developed and well-nourished. No distress.  HENT:  Head: Normocephalic and atraumatic.  Right Ear: External ear normal.  Left Ear: External ear normal.  Nose: Nose normal.  Eyes: Conjunctivae and EOM are normal. Pupils are equal, round, and reactive to light. No scleral icterus.  Neck: Normal range of motion. Neck supple. No tracheal deviation present.  Cardiovascular: Normal rate, regular rhythm and normal heart sounds.   Pulmonary/Chest: Effort normal. No respiratory distress. She has decreased breath sounds in the right upper field, the right middle field and the right lower field. She has no wheezes. She has no rales. She exhibits tenderness (She's tender in the right lateral chest wall. She has no  flail or crepitus).  Abdominal: She exhibits no mass. There is no tenderness. There is no rebound and no guarding.  Musculoskeletal: She exhibits no edema.  Lymphadenopathy:    She has no cervical adenopathy.  Neurological: She is alert and oriented to person, place, and time. She displays no tremor. She exhibits abnormal muscle tone. Gait (She is nonambulatory relying on a wheelchair.) abnormal. Coordination normal.  Skin: Skin is warm and dry. No rash noted.  Psychiatric: She has a normal mood and affect. Her behavior is normal.      Assessment & Plan:   Paxtyn was seen today for other.  Diagnoses and all orders for this visit:  Chest pain on breathing -     Cancel: DG Chest 2 View; Future   I am having Ms. Toalson maintain her clopidogrel, pantoprazole, travoprost (benzalkonium), fenofibrate, ALPRAZolam, dicyclomine, losartan, montelukast, Vitamin D-3, aspirin EC, polyethylene glycol, Propylhexedrine (BENZEDREX NA), simethicone, guaiFENesin, talc, Calcium Carbonate Antacid (CALCIUM ANTACID EXTRA STRENGTH PO),  TAB-A-VITE, hydrocortisone cream, promethazine, DULoxetine, acetaminophen, oxymetazoline, diltiazem, alum & mag hydroxide-simeth, carbidopa-levodopa, loperamide, and HYDROcodone-acetaminophen.  No orders of the defined types were placed in this encounter.   A radiograph of the chest was attempted unsuccessfully due to the inability of the patient to stand. She is weak and unable to bear weight due to her Parkinson's disease. She was referred to the emergency room for further treatment and evaluation as I think there is some significant risk of a pneumothorax on the right were offered ambulance transport but declined  Appropriate red flag conditions were discussed with the patient as well as actions that should be taken.  Patient expressed his understanding.  Follow-up: Return if symptoms worsen or fail to improve.  Roselee Culver, MD

## 2015-11-19 ENCOUNTER — Ambulatory Visit (INDEPENDENT_AMBULATORY_CARE_PROVIDER_SITE_OTHER): Payer: Medicare Other | Admitting: *Deleted

## 2015-11-19 DIAGNOSIS — R55 Syncope and collapse: Secondary | ICD-10-CM

## 2015-11-20 NOTE — Progress Notes (Signed)
Carelink Summary Report / Loop Recorder 

## 2015-12-07 LAB — CUP PACEART REMOTE DEVICE CHECK: MDC IDC SESS DTM: 20161218230652

## 2015-12-19 ENCOUNTER — Ambulatory Visit (INDEPENDENT_AMBULATORY_CARE_PROVIDER_SITE_OTHER): Payer: Medicare Other | Admitting: *Deleted

## 2015-12-19 DIAGNOSIS — R55 Syncope and collapse: Secondary | ICD-10-CM

## 2015-12-20 NOTE — Progress Notes (Signed)
Carelink Summary Report / Loop Recorder 

## 2016-01-03 ENCOUNTER — Ambulatory Visit: Payer: Medicare Other | Admitting: Neurology

## 2016-01-06 LAB — CUP PACEART REMOTE DEVICE CHECK: MDC IDC SESS DTM: 20170117230918

## 2016-01-06 NOTE — Progress Notes (Signed)
Carelink summary report received. Battery status OK. Normal device function. No new symptom episodes, tachy episodes, brady, or pause episodes. No new AF episodes. Monthly summary reports and ROV/PRN 

## 2016-01-08 LAB — CUP PACEART REMOTE DEVICE CHECK: Date Time Interrogation Session: 20170216233922

## 2016-01-08 NOTE — Progress Notes (Signed)
Carelink summary report received. Battery status OK. Normal device function. No new symptom episodes, tachy episodes, brady, or pause episodes. No new AF episodes. Monthly summary reports and ROV/PRN 

## 2016-01-20 ENCOUNTER — Ambulatory Visit (INDEPENDENT_AMBULATORY_CARE_PROVIDER_SITE_OTHER): Payer: Medicare Other | Admitting: *Deleted

## 2016-01-20 DIAGNOSIS — R55 Syncope and collapse: Secondary | ICD-10-CM | POA: Diagnosis not present

## 2016-01-21 NOTE — Progress Notes (Signed)
Carelink Summary Report / Loop Recorder 

## 2016-02-17 ENCOUNTER — Ambulatory Visit (INDEPENDENT_AMBULATORY_CARE_PROVIDER_SITE_OTHER): Payer: Medicare Other | Admitting: *Deleted

## 2016-02-17 DIAGNOSIS — R55 Syncope and collapse: Secondary | ICD-10-CM | POA: Diagnosis not present

## 2016-02-18 NOTE — Progress Notes (Signed)
Carelink Summary Report / Loop Recorder 

## 2016-03-18 ENCOUNTER — Ambulatory Visit (INDEPENDENT_AMBULATORY_CARE_PROVIDER_SITE_OTHER): Payer: Medicare Other | Admitting: *Deleted

## 2016-03-18 DIAGNOSIS — R55 Syncope and collapse: Secondary | ICD-10-CM | POA: Diagnosis not present

## 2016-03-20 ENCOUNTER — Encounter (HOSPITAL_COMMUNITY): Payer: Self-pay | Admitting: Emergency Medicine

## 2016-03-20 ENCOUNTER — Emergency Department (HOSPITAL_COMMUNITY): Payer: Medicare Other

## 2016-03-20 ENCOUNTER — Emergency Department (HOSPITAL_COMMUNITY)
Admission: EM | Admit: 2016-03-20 | Discharge: 2016-03-20 | Disposition: A | Discharge status: Home / Self Care | Payer: Medicare Other | Attending: Emergency Medicine | Admitting: Emergency Medicine

## 2016-03-20 DIAGNOSIS — Z8673 Personal history of transient ischemic attack (TIA), and cerebral infarction without residual deficits: Secondary | ICD-10-CM | POA: Insufficient documentation

## 2016-03-20 DIAGNOSIS — Y929 Unspecified place or not applicable: Secondary | ICD-10-CM | POA: Diagnosis not present

## 2016-03-20 DIAGNOSIS — M1990 Unspecified osteoarthritis, unspecified site: Secondary | ICD-10-CM | POA: Diagnosis not present

## 2016-03-20 DIAGNOSIS — S42301K Unspecified fracture of shaft of humerus, right arm, subsequent encounter for fracture with nonunion: Secondary | ICD-10-CM

## 2016-03-20 DIAGNOSIS — I10 Essential (primary) hypertension: Secondary | ICD-10-CM | POA: Diagnosis not present

## 2016-03-20 DIAGNOSIS — Z7984 Long term (current) use of oral hypoglycemic drugs: Secondary | ICD-10-CM | POA: Diagnosis not present

## 2016-03-20 DIAGNOSIS — Z79891 Long term (current) use of opiate analgesic: Secondary | ICD-10-CM | POA: Insufficient documentation

## 2016-03-20 DIAGNOSIS — Y939 Activity, unspecified: Secondary | ICD-10-CM | POA: Diagnosis not present

## 2016-03-20 DIAGNOSIS — S42201K Unspecified fracture of upper end of right humerus, subsequent encounter for fracture with nonunion: Secondary | ICD-10-CM | POA: Diagnosis not present

## 2016-03-20 DIAGNOSIS — W0110XA Fall on same level from slipping, tripping and stumbling with subsequent striking against unspecified object, initial encounter: Secondary | ICD-10-CM | POA: Diagnosis not present

## 2016-03-20 DIAGNOSIS — G20 Parkinson's disease: Secondary | ICD-10-CM | POA: Diagnosis not present

## 2016-03-20 DIAGNOSIS — Y999 Unspecified external cause status: Secondary | ICD-10-CM | POA: Insufficient documentation

## 2016-03-20 DIAGNOSIS — F329 Major depressive disorder, single episode, unspecified: Secondary | ICD-10-CM | POA: Diagnosis not present

## 2016-03-20 DIAGNOSIS — Z79899 Other long term (current) drug therapy: Secondary | ICD-10-CM | POA: Insufficient documentation

## 2016-03-20 DIAGNOSIS — J45909 Unspecified asthma, uncomplicated: Secondary | ICD-10-CM | POA: Diagnosis not present

## 2016-03-20 DIAGNOSIS — S4991XA Unspecified injury of right shoulder and upper arm, initial encounter: Secondary | ICD-10-CM | POA: Diagnosis present

## 2016-03-20 MED ORDER — HYDROCODONE-ACETAMINOPHEN 5-325 MG PO TABS: 2.0000 | ORAL_TABLET | ORAL | Status: DC | PRN

## 2016-03-20 MED ORDER — ONDANSETRON 4 MG PO TBDP
4.0000 mg | ORAL_TABLET | Freq: Once | ORAL | Status: AC
  Administered 2016-03-20: 4 mg via ORAL
  Filled 2016-03-20: qty 1

## 2016-03-20 MED ORDER — HYDROCODONE-ACETAMINOPHEN 5-325 MG PO TABS
1.0000 | ORAL_TABLET | Freq: Once | ORAL | Status: AC
  Administered 2016-03-20: 1 via ORAL
  Filled 2016-03-20: qty 1

## 2016-03-20 NOTE — Discharge Instructions (Signed)
Follow-up with Dr. Tamera Punt.  Wear sling, and splint until follow-up.  Vicodin for pain.

## 2016-03-20 NOTE — ED Notes (Signed)
Patient transported to X-ray 

## 2016-03-20 NOTE — Progress Notes (Signed)
Carelink Summary Report / Loop Recorder 

## 2016-03-20 NOTE — ED Notes (Signed)
Bed: [id number] Expected date:  Expected time:  Means of arrival:  Comments: EMS- 82yo F, fall/head injury/

## 2016-03-20 NOTE — ED Provider Notes (Signed)
CSN: [id number]     Arrival date & time 03/20/16  [id number] History   First MD Initiated Contact with Patient 03/20/16 [id number]     Chief Complaint  Patient presents with  . Fall      HPI  She presents for evaluation after a fall. She fell onto her buttocks and then onto her right arm and shoulder. She did hit her head. She has full recollection of the events without amnesia. Complaining of pain at the right mid upper arm into the elbow. States she had a prior fracture of this arm in 2010, and then reinjured it earlier this year. Both times treated nonoperatively. Patient is on Plavix due to prior stroke.   Past Medical History  Diagnosis Date  . Parkinson disease ([place])   . Cerebrovascular disease     CVA '03  . TIA (transient ischemic attack)     Multiple  . Depression   . Dyslipidemia   . Glaucoma   . GERD (gastroesophageal reflux disease)   . Obesity   . Endometriosis   . Paralysis agitans ([place]) 08/31/2013  . Macular degeneration     bilateral  . Hypertension   . Asthma   . Anxiety   . Headache(784.0)   . Arthritis   . Abnormality of gait 02/05/2015   Past Surgical History  Procedure Laterality Date  . Gallbladder surgery    . Abdominal hysterectomy    . Tonsillectomy    . Cataract extraction, bilateral    . Pilonidal cyst resection    . Tilt table study  05-28-14    orthostatic hypertension  . Loop recorder implant  05-28-14    MDT LINQ implanted by Dr Caryl Comes for cryptogenic stroke/palpitations  . Tilt table study N/A 05/28/2014    Procedure: TILT TABLE STUDY;  Surgeon: Deboraha Sprang, MD;  Location: [facility] CATH LAB;  Service: Cardiovascular;  Laterality: N/A;  . Loop recorder implant N/A 05/28/2014    Procedure: LOOP RECORDER IMPLANT;  Surgeon: Deboraha Sprang, MD;  Location: [facility] CATH LAB;  Service: Cardiovascular;  Laterality: N/A;   Family History  Problem Relation Age of Onset  . Arthritis Brother   . Skin cancer Brother   . Heart attack Mother    Social History   Substance Use Topics  . Smoking status: Never Smoker   . Smokeless tobacco: Never Used  . Alcohol Use: No   OB History    No data available     Review of Systems  Constitutional: Negative for fever, chills, diaphoresis, appetite change and fatigue.  HENT: Negative for mouth sores, sore throat and trouble swallowing.   Eyes: Negative for visual disturbance.  Respiratory: Negative for cough, chest tightness, shortness of breath and wheezing.   Cardiovascular: Negative for chest pain.  Gastrointestinal: Negative for nausea, vomiting, abdominal pain, diarrhea and abdominal distention.  Endocrine: Negative for polydipsia, polyphagia and polyuria.  Genitourinary: Negative for dysuria, frequency and hematuria.  Musculoskeletal: Negative for gait problem.       Pain from the upper third upper arm to the elbow. Abrasion on the elbow. Nontender directly over the clavicle. Complains of discomfort near the [facility] joint.  Skin: Negative for color change, pallor and rash.  Neurological: Negative for dizziness, syncope, light-headedness and headaches.  Hematological: Does not bruise/bleed easily.  Psychiatric/Behavioral: Negative for behavioral problems and confusion.      Allergies  Comtan; Mirapex; Sulfa antibiotics; and Ciprofloxacin  Home Medications   Prior to Admission medications   Medication Sig Start  Date End Date Taking? Authorizing Provider  ALPRAZolam Duanne Moron) 0.5 MG tablet Take 0.5 mg by mouth 3 (three) times daily.    Yes Historical Provider, MD  atorvastatin (LIPITOR) 10 MG tablet Take 10 mg by mouth daily.   Yes Historical Provider, MD  carbidopa-levodopa (SINEMET IR) 25-100 MG tablet Take 0.5 tablets by mouth 3 (three) times daily. 08/27/15  Yes Kathrynn Ducking, MD  carbidopa-levodopa (SINEMET IR) 25-250 MG tablet Take 1 tablet by mouth 3 (three) times daily.   Yes Historical Provider, MD  clopidogrel (PLAVIX) 75 MG tablet Take 75 mg by mouth at bedtime.    Yes Historical  Provider, MD  diltiazem (TIAZAC) 120 MG 24 hr capsule Take 120 mg by mouth 2 (two) times daily.   Yes Marylynn Pearson, MD  DULoxetine (CYMBALTA) 30 MG capsule Take 30 mg by mouth daily.   Yes Historical Provider, MD  guaiFENesin-codeine (ROBITUSSIN AC) 100-10 MG/5ML syrup Take 10 mLs by mouth every 4 (four) hours as needed for cough.   Yes Historical Provider, MD  losartan (COZAAR) 25 MG tablet Take 25 mg by mouth daily. 08/16/13  Yes Historical Provider, MD  metFORMIN (GLUCOPHAGE) 500 MG tablet Take 500 mg by mouth daily with breakfast.   Yes Historical Provider, MD  montelukast (SINGULAIR) 10 MG tablet Take 10 mg by mouth daily. 08/16/13  Yes Historical Provider, MD  Multiple Vitamin (TAB-A-VITE) TABS Take 1 tablet by mouth daily.   Yes Historical Provider, MD  oxymetazoline (AFRIN) 0.05 % nasal spray Place 1 spray into both nostrils once. Patient taking differently: Place 1 spray into both nostrils 4 (four) times daily as needed for congestion.  08/13/15  Yes Robbie Lis, MD  pantoprazole (PROTONIX) 40 MG tablet Take 40 mg by mouth daily.   Yes Historical Provider, MD  polyethylene glycol (MIRALAX / GLYCOLAX) packet Take 17 g by mouth daily.    Yes Historical Provider, MD  talc (ZEASORB) powder Apply 1 application topically 2 (two) times daily as needed (for irritation).   Yes Historical Provider, MD  travoprost, benzalkonium, (TRAVATAN) 0.004 % ophthalmic solution Place 1 drop into both eyes at bedtime.   Yes Historical Provider, MD  white petrolatum (VASELINE) GEL Apply 1 application topically 2 (two) times daily as needed (apply to nares).   Yes Historical Provider, MD  HYDROcodone-acetaminophen (NORCO/VICODIN) 5-325 MG tablet Take 2 tablets by mouth every 4 (four) hours as needed. 03/20/16   Tanna Furry, MD   BP 138/66 mmHg  Pulse 91  Temp(Src) 97.7 F (36.5 C) (Oral)  Resp 22  Ht 5' (1.524 m)  Wt 201 lb (91.173 kg)  BMI 39.26 kg/m2  SpO2 94% Physical Exam  Constitutional: She is  oriented to person, place, and time. She appears well-developed and well-nourished. No distress.  HENT:  Head: Normocephalic.  States that she struck the "side" of her head against the floor. Does not have obvious contusion or erythema to the skin. No blood over the TMs, mastoids, or ears nose or mouth. Nontender over the midline spine.  Eyes: Conjunctivae are normal. Pupils are equal, round, and reactive to light. No scleral icterus.  Neck: Normal range of motion. Neck supple. No thyromegaly present.  Cardiovascular: Normal rate and regular rhythm.  Exam reveals no gallop and no friction rub.   No murmur heard. Pulmonary/Chest: Effort normal and breath sounds normal. No respiratory distress. She has no wheezes. She has no rales.  Abdominal: Soft. Bowel sounds are normal. She exhibits no distension. There is no  tenderness. There is no rebound.  Musculoskeletal: Normal range of motion.  Neurological: She is alert and oriented to person, place, and time.  Skin: Skin is warm and dry. No rash noted.  Psychiatric: She has a normal mood and affect. Her behavior is normal.    ED Course  Procedures (including critical care time) Labs Review Labs Reviewed - No data to display  Imaging Review Dg Shoulder Right  03/20/2016  CLINICAL DATA:  Status post fall.  Right arm pain. EXAM: RIGHT SHOULDER - 2+ VIEW COMPARISON:  None. FINDINGS: Ununited versus refractured oblique comminuted fracture of the proximal right humeral diaphysis with 16 mm of medial displacement and apex lateral angulation. Fracture extends into the surgical neck of the right proximal humerus. No dislocation. Normal acromioclavicular joint. IMPRESSION: Ununited versus refractured oblique comminuted fracture of the proximal right humeral diaphysis with 16 mm of medial displacement and apex lateral angulation. Fracture extends into the surgical neck of the right proximal humerus. Old fracture deformity from prior fracture of the surgical  neck of the right proximal humerus. Electronically Signed   By: Kathreen Devoid   On: 03/20/2016 10:30   Dg Elbow Complete Right  03/20/2016  CLINICAL DATA:  Right shoulder pain after fall today at nursing facility. EXAM: RIGHT ELBOW - COMPLETE 3+ VIEW COMPARISON:  November 07, 2015. FINDINGS: There is no evidence of acute fracture, dislocation, or joint effusion. There is no evidence of arthropathy or other focal bone abnormality. Soft tissues are unremarkable. Old proximal right humeral shaft fracture is again noted. IMPRESSION: Old proximal right humeral shaft fracture is noted. No definite abnormality seen in the right elbow. Electronically Signed   By: Marijo Conception, M.D.   On: 03/20/2016 10:29   Ct Head Wo Contrast  03/20/2016  CLINICAL DATA:  Pain following fall EXAM: CT HEAD WITHOUT CONTRAST CT CERVICAL SPINE WITHOUT CONTRAST TECHNIQUE: Multidetector CT imaging of the head and cervical spine was performed following the standard protocol without intravenous contrast. Multiplanar CT image reconstructions of the cervical spine were also generated. COMPARISON:  Cervical spine CT June 05, 2015 ; head CT September 04, 2015; brain MRI September 25, 2015 FINDINGS: CT HEAD FINDINGS Moderate diffuse atrophy is stable. There is no intracranial mass, hemorrhage, extra-axial fluid collection, or midline shift. There is extensive small vessel disease throughout the centra semiovale bilaterally, stable. No acute infarct is evident. The bony calvarium appears intact. The mastoid air cells are clear. No intraorbital lesions are evident. CT CERVICAL SPINE FINDINGS There is no evident fracture. There is 3 mm of anterolisthesis of C3 on C4, stable. There is 3 mm of anterolisthesis of C4 on C5, stable. This spondylolisthesis is felt to be due to underlying spondylosis. There is no new spondylolisthesis. Prevertebral soft tissues and predental space regions are normal. There is a bony defect in the anterior left C6 lamina,  stable and either due to residua of old trauma or previous surgery. There is moderately severe disc space narrowing at all levels except for C2-3. There is facet hypertrophy at multiple levels bilaterally. There is no disc extrusion or stenosis evident. There are scattered foci of calcification in the left carotid artery. IMPRESSION: CT head: Atrophy with supratentorial small vessel disease, stable. No intracranial mass, hemorrhage, or extra-axial fluid collection. No acute appearing infarct. CT cervical spine: No fracture. Stable spondylolisthesis at C3-4 and C4-5. No new spondylolisthesis. Multilevel arthropathy, stable. Stable bony defect in the anterior left C6 lamina, either due to old trauma or prior surgery.  Calcification in left carotid artery. Also noted is evidence of prior trauma along the superior aspect of the T2 vertebral body, stable. Electronically Signed   By: Lowella Grip III M.D.   On: 03/20/2016 10:44   Ct Cervical Spine Wo Contrast  03/20/2016  CLINICAL DATA:  Pain following fall EXAM: CT HEAD WITHOUT CONTRAST CT CERVICAL SPINE WITHOUT CONTRAST TECHNIQUE: Multidetector CT imaging of the head and cervical spine was performed following the standard protocol without intravenous contrast. Multiplanar CT image reconstructions of the cervical spine were also generated. COMPARISON:  Cervical spine CT June 05, 2015 ; head CT September 04, 2015; brain MRI September 25, 2015 FINDINGS: CT HEAD FINDINGS Moderate diffuse atrophy is stable. There is no intracranial mass, hemorrhage, extra-axial fluid collection, or midline shift. There is extensive small vessel disease throughout the centra semiovale bilaterally, stable. No acute infarct is evident. The bony calvarium appears intact. The mastoid air cells are clear. No intraorbital lesions are evident. CT CERVICAL SPINE FINDINGS There is no evident fracture. There is 3 mm of anterolisthesis of C3 on C4, stable. There is 3 mm of anterolisthesis of C4 on  C5, stable. This spondylolisthesis is felt to be due to underlying spondylosis. There is no new spondylolisthesis. Prevertebral soft tissues and predental space regions are normal. There is a bony defect in the anterior left C6 lamina, stable and either due to residua of old trauma or previous surgery. There is moderately severe disc space narrowing at all levels except for C2-3. There is facet hypertrophy at multiple levels bilaterally. There is no disc extrusion or stenosis evident. There are scattered foci of calcification in the left carotid artery. IMPRESSION: CT head: Atrophy with supratentorial small vessel disease, stable. No intracranial mass, hemorrhage, or extra-axial fluid collection. No acute appearing infarct. CT cervical spine: No fracture. Stable spondylolisthesis at C3-4 and C4-5. No new spondylolisthesis. Multilevel arthropathy, stable. Stable bony defect in the anterior left C6 lamina, either due to old trauma or prior surgery. Calcification in left carotid artery. Also noted is evidence of prior trauma along the superior aspect of the T2 vertebral body, stable. Electronically Signed   By: Lowella Grip III M.D.   On: 03/20/2016 10:44   I have personally reviewed and evaluated these images and lab results as part of my medical decision-making.   EKG Interpretation None      MDM   Final diagnoses:  Humerus fracture, right, with nonunion, subsequent encounter    Patient with probable re-fracture of prior malunion. Original fracture 2010. Reinjured January of this year. Follows with Dr. Tamera Punt. Placed into a long-arm posterior splint, and then sling. Pain was controlled with by mouth meds. Referred for follow-up with Dr. Tamera Punt again. Encouraged to use her walker at home when upright.  SPLINT APPLICATION Date/Time: [id number] AM Authorized by: Lolita Patella Consent: Verbal consent obtained. Risks and benefits: risks, benefits and alternatives were discussed Consent given  by: patient Splint applied by: orthopedic technician Location details: RUE humerus Splint type: Long arm posterior Supplies used: orthoglass, ace Post-procedure: The splinted body part was neurovascularly unchanged following the procedure. Patient tolerance: Patient tolerated the procedure well with no immediate complications.       Tanna Furry, MD 03/20/16 1116

## 2016-03-20 NOTE — ED Notes (Addendum)
Ortho tech made aware of splint

## 2016-03-20 NOTE — ED Notes (Addendum)
Per EMS, patient fell on to her buttocks and right side and reports hitting head (complaining of right shoulder/arm pain) Denies loss of consciousness. Alert and oriented x4. Patient takes plavix. Patient is from [facility] [phone])

## 2016-03-20 NOTE — ED Notes (Signed)
MD at bedside. 

## 2016-03-20 NOTE — ED Notes (Signed)
Discharge instructions, follow up care, and rx x1 reviewed with patient. Patient verbalized understanding. 

## 2016-03-27 LAB — CUP PACEART REMOTE DEVICE CHECK: Date Time Interrogation Session: 20170318233844

## 2016-03-30 LAB — CUP PACEART REMOTE DEVICE CHECK: MDC IDC SESS DTM: 20170418000648

## 2016-03-30 NOTE — Progress Notes (Signed)
Carelink summary report received. Battery status OK. Normal device function. No new symptom episodes, tachy episodes, brady, or pause episodes. No new AF episodes. Monthly summary reports and ROV/PRN 

## 2016-04-17 ENCOUNTER — Ambulatory Visit (INDEPENDENT_AMBULATORY_CARE_PROVIDER_SITE_OTHER): Payer: Medicare Other | Admitting: *Deleted

## 2016-04-17 DIAGNOSIS — R55 Syncope and collapse: Secondary | ICD-10-CM | POA: Diagnosis not present

## 2016-04-20 NOTE — Progress Notes (Signed)
Carelink Summary Report / Loop Recorder 

## 2016-04-27 LAB — CUP PACEART REMOTE DEVICE CHECK
Date Time Interrogation Session: 20170617003648
MDC IDC SESS DTM: 20170518003612

## 2016-05-18 ENCOUNTER — Ambulatory Visit (INDEPENDENT_AMBULATORY_CARE_PROVIDER_SITE_OTHER): Payer: Medicare Other | Admitting: *Deleted

## 2016-05-18 DIAGNOSIS — R55 Syncope and collapse: Secondary | ICD-10-CM | POA: Diagnosis not present

## 2016-05-18 NOTE — Progress Notes (Signed)
Carelink Summary Report / Loop Recorder 

## 2016-06-09 LAB — CUP PACEART REMOTE DEVICE CHECK: Date Time Interrogation Session: 20170717013545

## 2016-06-16 ENCOUNTER — Ambulatory Visit (INDEPENDENT_AMBULATORY_CARE_PROVIDER_SITE_OTHER): Payer: Medicare Other | Admitting: *Deleted

## 2016-06-16 DIAGNOSIS — R55 Syncope and collapse: Secondary | ICD-10-CM

## 2016-06-17 NOTE — Progress Notes (Signed)
Carelink Summary Report / Loop Recorder 

## 2016-06-25 ENCOUNTER — Telehealth: Payer: Self-pay | Admitting: Cardiology

## 2016-06-25 NOTE — Telephone Encounter (Signed)
Attempted to call pt b/c her home monitor has not updated in at least 14 days. No answer and unable to leave a message.  

## 2016-07-03 ENCOUNTER — Encounter: Payer: Self-pay | Admitting: Cardiology

## 2016-07-03 ENCOUNTER — Telehealth: Payer: Self-pay | Admitting: Cardiology

## 2016-07-03 NOTE — Telephone Encounter (Signed)
Opened in error

## 2016-07-14 LAB — CUP PACEART REMOTE DEVICE CHECK: Date Time Interrogation Session: 20170816013624

## 2016-07-16 ENCOUNTER — Encounter: Payer: Medicare Other | Admitting: *Deleted

## 2016-07-17 ENCOUNTER — Encounter: Payer: Self-pay | Admitting: Cardiology

## 2016-08-14 ENCOUNTER — Emergency Department (HOSPITAL_COMMUNITY)
Admission: EM | Admit: 2016-08-14 | Discharge: 2016-08-14 | Disposition: A | Discharge status: Home / Self Care | Payer: Medicare Other | Attending: Emergency Medicine | Admitting: Emergency Medicine

## 2016-08-14 ENCOUNTER — Emergency Department (HOSPITAL_COMMUNITY): Payer: Medicare Other

## 2016-08-14 ENCOUNTER — Encounter (HOSPITAL_COMMUNITY): Payer: Self-pay

## 2016-08-14 DIAGNOSIS — Y939 Activity, unspecified: Secondary | ICD-10-CM | POA: Insufficient documentation

## 2016-08-14 DIAGNOSIS — Y9289 Other specified places as the place of occurrence of the external cause: Secondary | ICD-10-CM | POA: Diagnosis not present

## 2016-08-14 DIAGNOSIS — S0990XA Unspecified injury of head, initial encounter: Secondary | ICD-10-CM | POA: Diagnosis present

## 2016-08-14 DIAGNOSIS — G20 Parkinson's disease: Secondary | ICD-10-CM | POA: Insufficient documentation

## 2016-08-14 DIAGNOSIS — J45909 Unspecified asthma, uncomplicated: Secondary | ICD-10-CM | POA: Diagnosis not present

## 2016-08-14 DIAGNOSIS — Z8673 Personal history of transient ischemic attack (TIA), and cerebral infarction without residual deficits: Secondary | ICD-10-CM | POA: Diagnosis not present

## 2016-08-14 DIAGNOSIS — S0083XA Contusion of other part of head, initial encounter: Secondary | ICD-10-CM | POA: Diagnosis not present

## 2016-08-14 DIAGNOSIS — W19XXXA Unspecified fall, initial encounter: Secondary | ICD-10-CM

## 2016-08-14 DIAGNOSIS — I10 Essential (primary) hypertension: Secondary | ICD-10-CM | POA: Insufficient documentation

## 2016-08-14 DIAGNOSIS — W010XXA Fall on same level from slipping, tripping and stumbling without subsequent striking against object, initial encounter: Secondary | ICD-10-CM | POA: Insufficient documentation

## 2016-08-14 DIAGNOSIS — Z7984 Long term (current) use of oral hypoglycemic drugs: Secondary | ICD-10-CM | POA: Insufficient documentation

## 2016-08-14 DIAGNOSIS — S8002XA Contusion of left knee, initial encounter: Secondary | ICD-10-CM | POA: Insufficient documentation

## 2016-08-14 DIAGNOSIS — M25511 Pain in right shoulder: Secondary | ICD-10-CM | POA: Insufficient documentation

## 2016-08-14 DIAGNOSIS — Y999 Unspecified external cause status: Secondary | ICD-10-CM | POA: Insufficient documentation

## 2016-08-14 NOTE — ED Notes (Signed)
Wheeled the patient to the bathroom in a wheelchair.  Pt required assistance getting from the bed to the wheelchair, and from the wheelchair to the toilet, and back.  She endorses unsteadiness without assistance and/or without something to hold on to.

## 2016-08-14 NOTE — ED Notes (Signed)
SonShanon [phone], POA, call if needed.

## 2016-08-14 NOTE — ED Notes (Signed)
Pt taken to bathroom via wheelchair.  Pt had difficulty ambulating due to L knee pain and reports R shoulder pain.  Pt unable to ambulate without assistance.

## 2016-08-14 NOTE — ED Provider Notes (Signed)
[facility] DEPT Provider Note   CSN: [id number] Arrival date & time: 08/14/16  0749     History   Chief Complaint Chief Complaint  Patient presents with  . Fall    HPI [name] is a 82 y.o. female.  The history is provided by the patient. No language interpreter was used.   [name] is a 82 y.o. female who presents to the Emergency Department complaining of fall.  She resides in an assisted living facility and when she was getting up to walk to her bedside commode she slipped on the floor and fell, striking her head. She denies any loss of consciousness. She feels a bruise sensation to her forehead. No other complaints. She takes Eliquis. No recent illnesses. She denies nausea, vomiting, diarrhea, dysuria, fevers. She has a history of frequent falls and states that this is why she resides in assisted living facility. Past Medical History:  Diagnosis Date  . Abnormality of gait 02/05/2015  . Anxiety   . Arthritis   . Asthma   . Cerebrovascular disease    CVA '03  . Depression   . Dyslipidemia   . Endometriosis   . GERD (gastroesophageal reflux disease)   . Glaucoma   . Headache(784.0)   . Hypertension   . Macular degeneration    bilateral  . Obesity   . Paralysis agitans ([place]) 08/31/2013  . Parkinson disease ([place])   . TIA (transient ischemic attack)    Multiple    Patient Active Problem List   Diagnosis Date Noted  . Tachycardia 08/12/2015  . Abnormality of gait 02/05/2015  . Palpitations 05/28/2014  . TIA (transient ischemic attack) 05/25/2014  . Dyslipidemia 05/25/2014  . Parkinson disease ([place]) 05/25/2014  . H/O: CVA (cerebrovascular accident) 05/25/2014  . Paralysis agitans ([place]) 08/31/2013  . Dizziness and giddiness 08/31/2013    Past Surgical History:  Procedure Laterality Date  . ABDOMINAL HYSTERECTOMY    . CATARACT EXTRACTION, BILATERAL    . GALLBLADDER SURGERY    . LOOP RECORDER IMPLANT  05-28-14   MDT LINQ implanted by  Dr Caryl Comes for cryptogenic stroke/palpitations  . LOOP RECORDER IMPLANT N/A 05/28/2014   Procedure: LOOP RECORDER IMPLANT;  Surgeon: Deboraha Sprang, MD;  Location: [facility] CATH LAB;  Service: Cardiovascular;  Laterality: N/A;  . PILONIDAL CYST RESECTION    . TILT TABLE STUDY  05-28-14   orthostatic hypertension  . TILT TABLE STUDY N/A 05/28/2014   Procedure: TILT TABLE STUDY;  Surgeon: Deboraha Sprang, MD;  Location: [facility] CATH LAB;  Service: Cardiovascular;  Laterality: N/A;  . TONSILLECTOMY      OB History    No data available       Home Medications    Prior to Admission medications   Medication Sig Start Date End Date Taking? Authorizing Provider  acetaminophen (TYLENOL) 325 MG tablet Take 650 mg by mouth every 4 (four) hours as needed for mild pain.   Yes Historical Provider, MD  ALPRAZolam Duanne Moron) 0.5 MG tablet Take 0.5 mg by mouth 3 (three) times daily.    Yes Historical Provider, MD  alum & mag hydroxide-simeth (MINTOX) 200-200-20 MG/5ML suspension Take 30 mLs by mouth 4 (four) times daily as needed for indigestion or heartburn.   Yes Historical Provider, MD  atorvastatin (LIPITOR) 10 MG tablet Take 10 mg by mouth daily.   Yes Historical Provider, MD  carbidopa-levodopa (SINEMET IR) 25-250 MG tablet Take 1 tablet by mouth 3 (three) times daily.   Yes Historical  Provider, MD  Cholecalciferol (VITAMIN D3) 5000 units CAPS Take 1 capsule by mouth daily.   Yes Historical Provider, MD  clopidogrel (PLAVIX) 75 MG tablet Take 75 mg by mouth at bedtime.    Yes Historical Provider, MD  dicyclomine (BENTYL) 20 MG tablet Take 20 mg by mouth every 4 (four) hours as needed for spasms.   Yes Historical Provider, MD  diltiazem (TIAZAC) 120 MG 24 hr capsule Take 240 mg by mouth daily.    Yes Marylynn Pearson, MD  DULoxetine (CYMBALTA) 30 MG capsule Take 30 mg by mouth daily.   Yes Historical Provider, MD  HYDROcodone-acetaminophen (NORCO/VICODIN) 5-325 MG tablet Take 2 tablets by mouth every 4 (four) hours as  needed. Patient taking differently: Take 1 tablet by mouth 4 (four) times daily.  03/20/16  Yes Tanna Furry, MD  loperamide (IMODIUM) 2 MG capsule Take 2 mg by mouth as needed for diarrhea or loose stools.   Yes Historical Provider, MD  losartan (COZAAR) 25 MG tablet Take 25 mg by mouth daily. 08/16/13  Yes Historical Provider, MD  metFORMIN (GLUCOPHAGE) 500 MG tablet Take 500 mg by mouth daily with breakfast.   Yes Historical Provider, MD  montelukast (SINGULAIR) 10 MG tablet Take 10 mg by mouth daily. 08/16/13  Yes Historical Provider, MD  Multiple Vitamin (TAB-A-VITE) TABS Take 1 tablet by mouth daily.   Yes Historical Provider, MD  oxymetazoline (AFRIN) 0.05 % nasal spray Place 1 spray into both nostrils once. Patient taking differently: Place 1 spray into both nostrils 4 (four) times daily as needed for congestion.  08/13/15  Yes Robbie Lis, MD  pantoprazole (PROTONIX) 40 MG tablet Take 40 mg by mouth daily.   Yes Historical Provider, MD  Propylhexedrine Darcella Gasman) INHA Place 1 each into the nose every 4 (four) hours as needed.   Yes Historical Provider, MD  simethicone (MYLICON) 80 MG chewable tablet Chew 80 mg by mouth 4 (four) times daily as needed for flatulence.   Yes Historical Provider, MD  sodium chloride (OCEAN) 0.65 % SOLN nasal spray Place 1 spray into both nostrils 4 (four) times daily as needed for congestion.   Yes Historical Provider, MD  talc (ZEASORB) powder Apply 1 application topically 2 (two) times daily as needed (for irritation).   Yes Historical Provider, MD  travoprost, benzalkonium, (TRAVATAN) 0.004 % ophthalmic solution Place 1 drop into both eyes at bedtime.   Yes Historical Provider, MD  carbidopa-levodopa (SINEMET IR) 25-100 MG tablet Take 0.5 tablets by mouth 3 (three) times daily. Patient not taking: Reported on 08/14/2016 08/27/15   Kathrynn Ducking, MD  guaiFENesin-codeine [facility]) 100-10 MG/5ML syrup Take 10 mLs by mouth every 4 (four) hours as needed  for cough.    Historical Provider, MD    Family History Family History  Problem Relation Age of Onset  . Heart attack Mother   . Arthritis Brother   . Skin cancer Brother     Social History Social History  Substance Use Topics  . Smoking status: Never Smoker  . Smokeless tobacco: Never Used  . Alcohol use No     Allergies   Comtan [entacapone]; Mirapex [pramipexole dihydrochloride]; Sulfa antibiotics; and Ciprofloxacin   Review of Systems Review of Systems  All other systems reviewed and are negative.    Physical Exam Updated Vital Signs BP 130/65   Pulse 83   Temp 98.3 F (36.8 C) (Oral)   Resp 18   SpO2 95%   Physical Exam  Constitutional: She  is oriented to person, place, and time. She appears well-developed and well-nourished.  HENT:  Head: Normocephalic.  Mild swelling and tenderness to the left forehead  Neck:  No C-spine tenderness  Cardiovascular: Normal rate and regular rhythm.   No murmur heard. Pulmonary/Chest: Effort normal and breath sounds normal. No respiratory distress.  Abdominal: Soft. There is no tenderness. There is no rebound and no guarding.  Musculoskeletal: She exhibits no edema.  No hip tenderness. There is mild tenderness over the left knee with small area of anterior ecchymosis. Small amount of tenderness over the right shoulder.  Neurological: She is alert and oriented to person, place, and time.  5 out of 5 strength in all 4 extremities  Skin: Skin is warm and dry.  Psychiatric: She has a normal mood and affect. Her behavior is normal.  Nursing note and vitals reviewed.    ED Treatments / Results  Labs (all labs ordered are listed, but only abnormal results are displayed) Labs Reviewed - No data to display  EKG  EKG Interpretation None       Radiology Dg Shoulder Right  Result Date: 08/14/2016 CLINICAL DATA:  [place] this morning. Chronic pain in the proximal right humerus. EXAM: RIGHT SHOULDER - 2+ VIEW COMPARISON:   03/20/2016 and 06/05/2015 FINDINGS: Again noted is an old comminuted displaced fracture involving the proximal right humerus. The fracture involving the humeral head and femoral neck demonstrates callus formation but there continues to be marked displacement of the proximal diaphyseal fracture, measuring up to 1.9 cm on the scapular Y-view. Humeral head is located. Right AC joint is intact. IMPRESSION: Old comminuted and displaced fracture involving the proximal right humerus. There continues to be significant displacement of the diaphysis fracture component. No evidence for an acute injury. Electronically Signed   By: Markus Daft M.D.   On: 08/14/2016 11:20   Ct Head Wo Contrast  Result Date: 08/14/2016 CLINICAL DATA:  [place] and hit left forehead. EXAM: CT HEAD WITHOUT CONTRAST CT CERVICAL SPINE WITHOUT CONTRAST TECHNIQUE: Multidetector CT imaging of the head and cervical spine was performed following the standard protocol without intravenous contrast. Multiplanar CT image reconstructions of the cervical spine were also generated. COMPARISON:  03/20/2016 FINDINGS: CT HEAD FINDINGS Brain: Stable cerebral atrophy. Diffuse low density throughout the periventricular and subcortical white matter is stable and compatible with chronic changes. No evidence for acute hemorrhage, mass lesion, midline shift hydrocephalus or large infarct. Vascular: Right vertebral artery calcifications. Skull: Normal. Negative for fracture or focal lesion. Sinuses/Orbits: No acute finding. Other: None CT CERVICAL SPINE FINDINGS Alignment: There is chronic anterolisthesis at C3-C4 and C4-C5. Skull base and vertebrae: Again noted is a left laminectomy defect at C6. No evidence for an acute fracture. Again noted is mild deformity along the superior endplate of T2 which is unchanged. The cervical spine vertebral body heights are maintained. Soft tissues and spinal canal: No prevertebral fluid or swelling. No visible canal hematoma. Disc  levels: Anterolisthesis and disc space narrowing at C3-C4. Significant disc space narrowing at C5-C6. Disc space and endplate changes at [id number]. Right facet ankylosis at C5-C6. Multilevel degenerative facet disease. Upper chest: Unremarkable.  Mild scarring at the lung apices. Other: None IMPRESSION: No acute intracranial abnormality. Stable atrophy with chronic small vessel ischemic changes. Multilevel degenerative changes in cervical spine. No acute bone abnormality in cervical spine. Electronically Signed   By: Markus Daft M.D.   On: 08/14/2016 09:37   Ct Cervical Spine Wo Contrast  Result Date: 08/14/2016 CLINICAL  DATA:  [place] and hit left forehead. EXAM: CT HEAD WITHOUT CONTRAST CT CERVICAL SPINE WITHOUT CONTRAST TECHNIQUE: Multidetector CT imaging of the head and cervical spine was performed following the standard protocol without intravenous contrast. Multiplanar CT image reconstructions of the cervical spine were also generated. COMPARISON:  03/20/2016 FINDINGS: CT HEAD FINDINGS Brain: Stable cerebral atrophy. Diffuse low density throughout the periventricular and subcortical white matter is stable and compatible with chronic changes. No evidence for acute hemorrhage, mass lesion, midline shift hydrocephalus or large infarct. Vascular: Right vertebral artery calcifications. Skull: Normal. Negative for fracture or focal lesion. Sinuses/Orbits: No acute finding. Other: None CT CERVICAL SPINE FINDINGS Alignment: There is chronic anterolisthesis at C3-C4 and C4-C5. Skull base and vertebrae: Again noted is a left laminectomy defect at C6. No evidence for an acute fracture. Again noted is mild deformity along the superior endplate of T2 which is unchanged. The cervical spine vertebral body heights are maintained. Soft tissues and spinal canal: No prevertebral fluid or swelling. No visible canal hematoma. Disc levels: Anterolisthesis and disc space narrowing at C3-C4. Significant disc space narrowing at C5-C6.  Disc space and endplate changes at [id number]. Right facet ankylosis at C5-C6. Multilevel degenerative facet disease. Upper chest: Unremarkable.  Mild scarring at the lung apices. Other: None IMPRESSION: No acute intracranial abnormality. Stable atrophy with chronic small vessel ischemic changes. Multilevel degenerative changes in cervical spine. No acute bone abnormality in cervical spine. Electronically Signed   By: Markus Daft M.D.   On: 08/14/2016 09:37   Dg Knee Complete 4 Views Left  Result Date: 08/14/2016 CLINICAL DATA:  Status post fall.  Anterior left knee pain. EXAM: LEFT KNEE - COMPLETE 4+ VIEW COMPARISON:  None. FINDINGS: No evidence of fracture, dislocation, or joint effusion. No evidence of arthropathy or other focal bone abnormality. Soft tissues are unremarkable. IMPRESSION: Negative. Electronically Signed   By: Kathreen Devoid   On: 08/14/2016 11:14    Procedures Procedures (including critical care time)  Medications Ordered in ED Medications - No data to display   Initial Impression / Assessment and Plan / ED Course  I have reviewed the triage vital signs and the nursing notes.  Pertinent labs & imaging results that were available during my care of the patient were reviewed by me and considered in my medical decision making (see chart for details).  Clinical Course    Patient with history of frequent falls here for evaluation of injuries following a mechanical fall. She is in no distress in the emergency department with some bruising to her left knee and her left forehead. CT head with no acute intracranial abnormality. No evidence of acute or new fracture/dislocation. She is able to ambulate near her baseline in the department. Plan to DC home with outpatient follow-up and return precautions.  Final Clinical Impressions(s) / ED Diagnoses   Final diagnoses:  Fall, initial encounter  Contusion of left knee, initial encounter  Contusion of face, initial encounter    New  Prescriptions Discharge Medication List as of 08/14/2016 12:45 PM       Quintella Reichert, MD 08/15/16 0840

## 2016-08-14 NOTE — ED Notes (Signed)
Pt given a [place] sandwich and a ginger ale with ice, per Humberto Seals, Therapist, sports.

## 2016-08-14 NOTE — ED Triage Notes (Addendum)
[facility]- pt coming from [facility] at [facility] after a fall, she lost her footing and fell getting on the commode. She hit her head, very small spot to the forehead in which pt reports very minimal pain. Pt takes eloquis due to prior stroke. Pt alert and oriented, denies LOC.

## 2016-10-07 ENCOUNTER — Inpatient Hospital Stay: Admit: 2016-10-07 | Payer: Medicare Other | Admitting: Orthopedic Surgery

## 2016-10-07 SURGERY — OPEN REDUCTION INTERNAL FIXATION (ORIF) PROXIMAL HUMERUS FRACTURE
Anesthesia: Choice | Laterality: Right

## 2016-10-11 ENCOUNTER — Emergency Department (HOSPITAL_COMMUNITY)
Admission: EM | Admit: 2016-10-11 | Discharge: 2016-10-11 | Disposition: A | Discharge status: Home / Self Care | Payer: Medicare Other | Attending: Emergency Medicine | Admitting: Emergency Medicine

## 2016-10-11 ENCOUNTER — Emergency Department (HOSPITAL_COMMUNITY): Payer: Medicare Other

## 2016-10-11 ENCOUNTER — Encounter (HOSPITAL_COMMUNITY): Payer: Self-pay | Admitting: Emergency Medicine

## 2016-10-11 DIAGNOSIS — S8002XA Contusion of left knee, initial encounter: Secondary | ICD-10-CM

## 2016-10-11 DIAGNOSIS — S8992XA Unspecified injury of left lower leg, initial encounter: Secondary | ICD-10-CM | POA: Diagnosis present

## 2016-10-11 DIAGNOSIS — W19XXXA Unspecified fall, initial encounter: Secondary | ICD-10-CM

## 2016-10-11 DIAGNOSIS — Z79899 Other long term (current) drug therapy: Secondary | ICD-10-CM | POA: Diagnosis not present

## 2016-10-11 DIAGNOSIS — J45909 Unspecified asthma, uncomplicated: Secondary | ICD-10-CM | POA: Insufficient documentation

## 2016-10-11 DIAGNOSIS — Z8673 Personal history of transient ischemic attack (TIA), and cerebral infarction without residual deficits: Secondary | ICD-10-CM | POA: Diagnosis not present

## 2016-10-11 DIAGNOSIS — Y939 Activity, unspecified: Secondary | ICD-10-CM | POA: Diagnosis not present

## 2016-10-11 DIAGNOSIS — G20 Parkinson's disease: Secondary | ICD-10-CM | POA: Diagnosis not present

## 2016-10-11 DIAGNOSIS — S0990XA Unspecified injury of head, initial encounter: Secondary | ICD-10-CM | POA: Diagnosis not present

## 2016-10-11 DIAGNOSIS — I2510 Atherosclerotic heart disease of native coronary artery without angina pectoris: Secondary | ICD-10-CM | POA: Diagnosis not present

## 2016-10-11 DIAGNOSIS — Y929 Unspecified place or not applicable: Secondary | ICD-10-CM | POA: Diagnosis not present

## 2016-10-11 DIAGNOSIS — Z7984 Long term (current) use of oral hypoglycemic drugs: Secondary | ICD-10-CM | POA: Insufficient documentation

## 2016-10-11 DIAGNOSIS — W050XXA Fall from non-moving wheelchair, initial encounter: Secondary | ICD-10-CM | POA: Diagnosis not present

## 2016-10-11 DIAGNOSIS — Y999 Unspecified external cause status: Secondary | ICD-10-CM | POA: Diagnosis not present

## 2016-10-11 MED ORDER — HYDROCODONE-ACETAMINOPHEN 5-325 MG PO TABS
1.0000 | ORAL_TABLET | Freq: Once | ORAL | Status: AC
  Administered 2016-10-11: 1 via ORAL
  Filled 2016-10-11: qty 1

## 2016-10-11 NOTE — ED Notes (Signed)
Bed: [id number] Expected date: 10/11/16 Expected time: 5:20 PM Means of arrival:  Comments: Fall

## 2016-10-11 NOTE — ED Triage Notes (Signed)
Pt from facility via EMS post fall, Per EMS, pt was sitting back into wheelchair that wasn't locked and fell forward hitting the side of her head on the refrigerator. Pt has head abrasion but denies LOC. Pt also has knee and arm abrasions. Pt is A&O and in NAD. Pt is on plavix.

## 2016-10-11 NOTE — ED Notes (Signed)
No respiratory or acute distress noted alert and oriented x 3 call light in reach family at bedside. 

## 2016-10-11 NOTE — Discharge Instructions (Signed)
Return to the ED with any concerns including vomiting, seizure activity, decreased level of alertness/lethargy, or any other alarming symptoms °

## 2016-10-11 NOTE — ED Provider Notes (Signed)
[facility] DEPT Provider Note   CSN: [id number] Arrival date & time: 10/11/16  1716     History   Chief Complaint Chief Complaint  Patient presents with  . Fall  . Arm Pain  . Knee Pain    HPI [name] is a 82 y.o. female.  HPI  Pt presenting after mechanical fall. She states she was going from her lift chair to her wheelchair, the wheelchair was not locked and she fell forward while holding onto the wheelchair.  She fell forward and hit the front of her head.  She also c/o pain in left knee. She has chronic pain in right shoulder/arm from prior fracture that did not heal well.  Denies back pain. Has some mild neck pain.  She does take plavix.  She did not have LOC, no seizure activity, no vomiting.  There are no other associated systemic symptoms, there are no other alleviating or modifying factors.   Past Medical History:  Diagnosis Date  . Abnormality of gait 02/05/2015  . Anxiety   . Arthritis   . Asthma   . Cerebrovascular disease    CVA '03  . Depression   . Dyslipidemia   . Endometriosis   . GERD (gastroesophageal reflux disease)   . Glaucoma   . Headache(784.0)   . Hypertension   . Macular degeneration    bilateral  . Obesity   . Paralysis agitans ([place]) 08/31/2013  . Parkinson disease ([place])   . TIA (transient ischemic attack)    Multiple    Patient Active Problem List   Diagnosis Date Noted  . Tachycardia 08/12/2015  . Abnormality of gait 02/05/2015  . Palpitations 05/28/2014  . TIA (transient ischemic attack) 05/25/2014  . Dyslipidemia 05/25/2014  . Parkinson disease ([place]) 05/25/2014  . H/O: CVA (cerebrovascular accident) 05/25/2014  . Paralysis agitans ([place]) 08/31/2013  . Dizziness and giddiness 08/31/2013    Past Surgical History:  Procedure Laterality Date  . ABDOMINAL HYSTERECTOMY    . CATARACT EXTRACTION, BILATERAL    . GALLBLADDER SURGERY    . LOOP RECORDER IMPLANT  05-28-14   MDT LINQ implanted by Dr Caryl Comes for cryptogenic  stroke/palpitations  . LOOP RECORDER IMPLANT N/A 05/28/2014   Procedure: LOOP RECORDER IMPLANT;  Surgeon: Deboraha Sprang, MD;  Location: [facility] CATH LAB;  Service: Cardiovascular;  Laterality: N/A;  . PILONIDAL CYST RESECTION    . TILT TABLE STUDY  05-28-14   orthostatic hypertension  . TILT TABLE STUDY N/A 05/28/2014   Procedure: TILT TABLE STUDY;  Surgeon: Deboraha Sprang, MD;  Location: [facility] CATH LAB;  Service: Cardiovascular;  Laterality: N/A;  . TONSILLECTOMY      OB History    No data available       Home Medications    Prior to Admission medications   Medication Sig Start Date End Date Taking? Authorizing Provider  acetaminophen (TYLENOL) 325 MG tablet Take 325 mg by mouth every 4 (four) hours as needed for mild pain or fever.    Yes Historical Provider, MD  ALPRAZolam Duanne Moron) 0.5 MG tablet Take 0.5 mg by mouth 3 (three) times daily.    Yes Historical Provider, MD  alum & mag hydroxide-simeth (MINTOX) 200-200-20 MG/5ML suspension Take 30 mLs by mouth 4 (four) times daily as needed for indigestion or heartburn.   Yes Historical Provider, MD  atorvastatin (LIPITOR) 10 MG tablet Take 10 mg by mouth at bedtime.    Yes Historical Provider, MD  carbidopa-levodopa (SINEMET IR) 25-250 MG tablet  Take 1 tablet by mouth daily.    Yes Historical Provider, MD  Cholecalciferol (VITAMIN D3) 5000 units CAPS Take 5,000 Units by mouth daily at 12 noon.    Yes Historical Provider, MD  clopidogrel (PLAVIX) 75 MG tablet Take 75 mg by mouth daily at 12 noon.    Yes Historical Provider, MD  Corn Starch POWD Apply 1 application topically 2 (two) times daily as needed (between skin folds).   Yes Historical Provider, MD  dicyclomine (BENTYL) 20 MG tablet Take 20 mg by mouth every 4 (four) hours as needed for spasms.   Yes Historical Provider, MD  diltiazem (CARDIZEM CD) 120 MG 24 hr capsule Take 240 mg by mouth daily.   Yes Historical Provider, MD  DULoxetine (CYMBALTA) 30 MG capsule Take 30 mg by mouth daily.    Yes Historical Provider, MD  guaiFENesin (MUCINEX) 600 MG 12 hr tablet Take 600 mg by mouth 2 (two) times daily as needed for cough (and/or congestion).   Yes Historical Provider, MD  guaiFENesin-codeine (ROBITUSSIN AC) 100-10 MG/5ML syrup Take 10 mLs by mouth every 4 (four) hours as needed for cough.   Yes Historical Provider, MD  HYDROcodone-acetaminophen (NORCO/VICODIN) 5-325 MG tablet Take 1 tablet by mouth 2 (two) times daily. Pt also uses every four hours as needed for pain.   Yes Historical Provider, MD  loperamide (IMODIUM) 2 MG capsule Take 2 mg by mouth as needed for diarrhea or loose stools.   Yes Historical Provider, MD  losartan (COZAAR) 25 MG tablet Take 25 mg by mouth at bedtime.    Yes Historical Provider, MD  metFORMIN (GLUCOPHAGE) 500 MG tablet Take 500 mg by mouth 2 (two) times daily before a meal.    Yes Historical Provider, MD  montelukast (SINGULAIR) 10 MG tablet Take 10 mg by mouth daily at 12 noon.    Yes Historical Provider, MD  Multiple Vitamin (TAB-A-VITE) TABS Take 1 tablet by mouth daily at 12 noon.    Yes Historical Provider, MD  oxymetazoline (AFRIN) 0.05 % nasal spray Place 1 spray into both nostrils as needed (for nose bleeds).   Yes Historical Provider, MD  pantoprazole (PROTONIX) 40 MG tablet Take 40 mg by mouth daily before breakfast.    Yes Historical Provider, MD  polyethylene glycol (MIRALAX / GLYCOLAX) packet Take 17 g by mouth daily.   Yes Historical Provider, MD  Propylhexedrine Darcella Gasman) INHA Place 1 each into the nose every 4 (four) hours as needed (for allergies).    Yes Historical Provider, MD  simethicone (MYLICON) 80 MG chewable tablet Chew 80 mg by mouth 4 (four) times daily as needed for flatulence.   Yes Historical Provider, MD  sodium chloride (OCEAN) 0.65 % SOLN nasal spray Place 1 spray into both nostrils 4 (four) times daily as needed for congestion.   Yes Historical Provider, MD  talc (ZEASORB) powder Apply 1 application topically 2 (two) times  daily as needed (for irritation).   Yes Historical Provider, MD  Travoprost, BAK Free, (TRAVATAN) 0.004 % SOLN ophthalmic solution Place 1 drop into both eyes at bedtime.   Yes Historical Provider, MD    Family History Family History  Problem Relation Age of Onset  . Heart attack Mother   . Arthritis Brother   . Skin cancer Brother     Social History Social History  Substance Use Topics  . Smoking status: Never Smoker  . Smokeless tobacco: Never Used  . Alcohol use No     Allergies  Comtan [entacapone]; Mirapex [pramipexole dihydrochloride]; Sulfa antibiotics; and Ciprofloxacin   Review of Systems Review of Systems  ROS reviewed and all otherwise negative except for mentioned in HPI   Physical Exam Updated Vital Signs BP 128/67 (BP Location: Left Arm)   Pulse 76   Temp 98.2 F (36.8 C) (Oral)   Resp 20   Ht 5' (1.524 m)   Wt 201 lb (91.2 kg)   SpO2 96%   BMI 39.26 kg/m  Vitals reviewed Physical Exam Physical Examination: General appearance - alert, well appearing, and in no distress Mental status - alert, oriented to person, place, and time Eyes - no conjunctival injection, no scleral icterus Neck - no midline tenderness Chest - clear to auscultation, no wheezes, rales or rhonchi, symmetric air entry Heart - normal rate, regular rhythm, normal S1, S2, no murmurs, rubs, clicks or gallops Abdomen - soft, nontender, nondistended, no masses or organomegaly Back exam - no midline tenderness to palpation Neurological - alert, oriented x 3, normal speech, no focal findings Musculoskeletal - ttp over left knee with contusion, chronic pain and tenderness over right shoulder-son states this is chronic and unchanged from a prior no joint tenderness, deformity or swelling Extremities - peripheral pulses normal, no pedal edema, no clubbing or cyanosis Skin - normal coloration and turgor, no rashes,   ED Treatments / Results  Labs (all labs ordered are listed, but only  abnormal results are displayed) Labs Reviewed - No data to display  EKG  EKG Interpretation None       Radiology Dg Shoulder Right  Result Date: 10/11/2016 CLINICAL DATA:  Fall from wheelchair with arm pain, initial encounter EXAM: RIGHT SHOULDER - 2+ VIEW COMPARISON:  08/14/2016 FINDINGS: There is again noted an old comminuted fracture of the proximal right humerus. Some mild healing is seen. Degenerative changes of the shoulder joint are seen. No other focal abnormality is noted. IMPRESSION: Chronic deformity of the right proximal humerus Electronically Signed   By: Inez Catalina M.D.   On: 10/11/2016 19:15   Ct Head Wo Contrast  Result Date: 10/11/2016 CLINICAL DATA:  Recent fall with headaches, blood thinner usage EXAM: CT HEAD WITHOUT CONTRAST CT CERVICAL SPINE WITHOUT CONTRAST TECHNIQUE: Multidetector CT imaging of the head and cervical spine was performed following the standard protocol without intravenous contrast. Multiplanar CT image reconstructions of the cervical spine were also generated. COMPARISON:  08/14/2016 FINDINGS: CT HEAD FINDINGS Brain: Atrophic changes are identified as well as chronic white matter ischemic change. The overall appearance is stable from the prior exam. No findings to suggest acute hemorrhage, acute infarction or space-occupying mass lesion are seen. Vascular: No hyperdense vessel or unexpected calcification. Skull: Normal. Negative for fracture or focal lesion. Sinuses/Orbits: No acute finding. Other: None. CT CERVICAL SPINE FINDINGS Alignment: Mild degenerative anterolisthesis of C3 on C4 and C4 on C5 is noted. Skull base and vertebrae: Vertebral body height is well maintained. No acute fracture or acute facet abnormality is noted. Multilevel facet hypertrophic changes are seen. Mild osteophytic changes are noted worst at the C6-7 level. The lamina at C6 on the left is absent likely related to prior laminectomy. This is stable from the prior exam. Stable  compression deformity of T2 is noted as well. Soft tissues and spinal canal: No prevertebral fluid or swelling. No visible canal hematoma. Disc levels:  Disc space narrowing at C5-6 and C6-7. Upper chest: Negative. IMPRESSION: CT of the head: Chronic atrophic and ischemic changes similar to that seen on prior exam.  CT of cervical spine: Degenerative change similar to that noted on the prior study. Postsurgical change at C6. No acute abnormality is noted. Electronically Signed   By: Inez Catalina M.D.   On: 10/11/2016 19:36   Ct Cervical Spine Wo Contrast  Result Date: 10/11/2016 CLINICAL DATA:  Recent fall with headaches, blood thinner usage EXAM: CT HEAD WITHOUT CONTRAST CT CERVICAL SPINE WITHOUT CONTRAST TECHNIQUE: Multidetector CT imaging of the head and cervical spine was performed following the standard protocol without intravenous contrast. Multiplanar CT image reconstructions of the cervical spine were also generated. COMPARISON:  08/14/2016 FINDINGS: CT HEAD FINDINGS Brain: Atrophic changes are identified as well as chronic white matter ischemic change. The overall appearance is stable from the prior exam. No findings to suggest acute hemorrhage, acute infarction or space-occupying mass lesion are seen. Vascular: No hyperdense vessel or unexpected calcification. Skull: Normal. Negative for fracture or focal lesion. Sinuses/Orbits: No acute finding. Other: None. CT CERVICAL SPINE FINDINGS Alignment: Mild degenerative anterolisthesis of C3 on C4 and C4 on C5 is noted. Skull base and vertebrae: Vertebral body height is well maintained. No acute fracture or acute facet abnormality is noted. Multilevel facet hypertrophic changes are seen. Mild osteophytic changes are noted worst at the C6-7 level. The lamina at C6 on the left is absent likely related to prior laminectomy. This is stable from the prior exam. Stable compression deformity of T2 is noted as well. Soft tissues and spinal canal: No prevertebral  fluid or swelling. No visible canal hematoma. Disc levels:  Disc space narrowing at C5-6 and C6-7. Upper chest: Negative. IMPRESSION: CT of the head: Chronic atrophic and ischemic changes similar to that seen on prior exam. CT of cervical spine: Degenerative change similar to that noted on the prior study. Postsurgical change at C6. No acute abnormality is noted. Electronically Signed   By: Inez Catalina M.D.   On: 10/11/2016 19:36   Dg Knee Complete 4 Views Left  Result Date: 10/11/2016 CLINICAL DATA:  [place] forward out of a wheelchair, LEFT anterior knee bruising EXAM: LEFT KNEE - COMPLETE 4+ VIEW COMPARISON:  08/14/2016 FINDINGS: Diffuse osseous demineralization. Joint spaces fairly well preserved. No acute fracture, dislocation or bone destruction. No knee joint effusion. IMPRESSION: No acute osseous abnormalities. Electronically Signed   By: Lavonia Dana M.D.   On: 10/11/2016 19:15    Procedures Procedures (including critical care time)  Medications Ordered in ED Medications  HYDROcodone-acetaminophen (NORCO/VICODIN) 5-325 MG per tablet 1 tablet (1 tablet Oral Given 10/11/16 1845)     Initial Impression / Assessment and Plan / ED Course  I have reviewed the triage vital signs and the nursing notes.  Pertinent labs & imaging results that were available during my care of the patient were reviewed by me and considered in my medical decision making (see chart for details).  Clinical Course     Pt presenting after mechanical fall.  Did strike her head and takes plavix.  She also c/o left knee pain and chronic right shoulder/arm pain after prior break that healed improperly.  Head CT and cervical spine CT are reassuring.  No fracture of knee.  No acute change in right upper extremity.  Pt to continue her ongoing pain medications as needed.  Discharged with strict return precautions.  Pt agreeable with plan.  Final Clinical Impressions(s) / ED Diagnoses   Final diagnoses:  Fall, initial  encounter  Minor head injury, initial encounter  Contusion of left knee, initial encounter    New  Prescriptions Discharge Medication List as of 10/11/2016  7:50 PM       Alfonzo Beers, MD 10/14/16 [phone]

## 2016-10-11 NOTE — ED Notes (Signed)
Called report to [facility] gave to provider there [phone].

## 2017-08-07 ENCOUNTER — Emergency Department (HOSPITAL_COMMUNITY)
Admission: EM | Admit: 2017-08-07 | Discharge: 2017-08-07 | Disposition: A | Discharge status: Home / Self Care | Payer: Medicare Other | Attending: Emergency Medicine | Admitting: Emergency Medicine

## 2017-08-07 ENCOUNTER — Emergency Department (HOSPITAL_COMMUNITY): Payer: Medicare Other

## 2017-08-07 ENCOUNTER — Encounter (HOSPITAL_COMMUNITY): Payer: Self-pay | Admitting: Emergency Medicine

## 2017-08-07 DIAGNOSIS — W0110XA Fall on same level from slipping, tripping and stumbling with subsequent striking against unspecified object, initial encounter: Secondary | ICD-10-CM | POA: Diagnosis not present

## 2017-08-07 DIAGNOSIS — Z8673 Personal history of transient ischemic attack (TIA), and cerebral infarction without residual deficits: Secondary | ICD-10-CM | POA: Insufficient documentation

## 2017-08-07 DIAGNOSIS — Y929 Unspecified place or not applicable: Secondary | ICD-10-CM | POA: Insufficient documentation

## 2017-08-07 DIAGNOSIS — Z7984 Long term (current) use of oral hypoglycemic drugs: Secondary | ICD-10-CM | POA: Insufficient documentation

## 2017-08-07 DIAGNOSIS — E785 Hyperlipidemia, unspecified: Secondary | ICD-10-CM | POA: Insufficient documentation

## 2017-08-07 DIAGNOSIS — Y9389 Activity, other specified: Secondary | ICD-10-CM | POA: Diagnosis not present

## 2017-08-07 DIAGNOSIS — I10 Essential (primary) hypertension: Secondary | ICD-10-CM | POA: Insufficient documentation

## 2017-08-07 DIAGNOSIS — J45909 Unspecified asthma, uncomplicated: Secondary | ICD-10-CM | POA: Insufficient documentation

## 2017-08-07 DIAGNOSIS — W19XXXA Unspecified fall, initial encounter: Secondary | ICD-10-CM

## 2017-08-07 DIAGNOSIS — G20 Parkinson's disease: Secondary | ICD-10-CM | POA: Diagnosis not present

## 2017-08-07 DIAGNOSIS — Z79899 Other long term (current) drug therapy: Secondary | ICD-10-CM | POA: Diagnosis not present

## 2017-08-07 DIAGNOSIS — S0990XA Unspecified injury of head, initial encounter: Secondary | ICD-10-CM

## 2017-08-07 DIAGNOSIS — Y998 Other external cause status: Secondary | ICD-10-CM | POA: Insufficient documentation

## 2017-08-07 NOTE — ED Notes (Signed)
Placed pt on the bedpan, pt stated "leave me on for a while till im able to go." pt informed when ready to come off of the bedpan to let us know.

## 2017-08-07 NOTE — Discharge Instructions (Signed)
Workup for the fall with negative CT of head and neck. No evidence of any acute injury. Stable for discharge back to the nursing facility

## 2017-08-07 NOTE — ED Notes (Signed)
Pt back from CT

## 2017-08-07 NOTE — ED Provider Notes (Signed)
[facility] DEPT Provider Note   CSN: [id number] Arrival date & time: 08/07/17  6629     History   Chief Complaint Chief Complaint  Patient presents with  . Fall    HPI [name] is a 83 y.o. female.  Your patient brought in from nursing facility patient slipped while trying to get to the bedside commode on a wet floor and fell striking the back of her head. Patient had some neck pain C collar placed by EMS. Brought in for evaluation of potential head injury. Patient is on Plavix. Patient without any other complaints. Patient has a history of Parkinson's disease.      Past Medical History:  Diagnosis Date  . Abnormality of gait 02/05/2015  . Anxiety   . Arthritis   . Asthma   . Cerebrovascular disease    CVA '03  . Depression   . Dyslipidemia   . Endometriosis   . GERD (gastroesophageal reflux disease)   . Glaucoma   . Headache(784.0)   . Hypertension   . Macular degeneration    bilateral  . Obesity   . Paralysis agitans ([place]) 08/31/2013  . Parkinson disease ([place])   . TIA (transient ischemic attack)    Multiple    Patient Active Problem List   Diagnosis Date Noted  . Tachycardia 08/12/2015  . Abnormality of gait 02/05/2015  . Palpitations 05/28/2014  . TIA (transient ischemic attack) 05/25/2014  . Dyslipidemia 05/25/2014  . Parkinson disease ([place]) 05/25/2014  . H/O: CVA (cerebrovascular accident) 05/25/2014  . Paralysis agitans ([place]) 08/31/2013  . Dizziness and giddiness 08/31/2013    Past Surgical History:  Procedure Laterality Date  . ABDOMINAL HYSTERECTOMY    . CATARACT EXTRACTION, BILATERAL    . GALLBLADDER SURGERY    . LOOP RECORDER IMPLANT  05-28-14   MDT LINQ implanted by Dr Caryl Comes for cryptogenic stroke/palpitations  . LOOP RECORDER IMPLANT N/A 05/28/2014   Procedure: LOOP RECORDER IMPLANT;  Surgeon: Deboraha Sprang, MD;  Location: [facility] CATH LAB;  Service: Cardiovascular;  Laterality: N/A;  . PILONIDAL CYST RESECTION    . TILT TABLE  STUDY  05-28-14   orthostatic hypertension  . TILT TABLE STUDY N/A 05/28/2014   Procedure: TILT TABLE STUDY;  Surgeon: Deboraha Sprang, MD;  Location: [facility] CATH LAB;  Service: Cardiovascular;  Laterality: N/A;  . TONSILLECTOMY      OB History    No data available       Home Medications    Prior to Admission medications   Medication Sig Start Date End Date Taking? Authorizing Provider  acetaminophen (TYLENOL) 325 MG tablet Take 325 mg by mouth every 4 (four) hours as needed for mild pain or fever.     [provider]  ALPRAZolam Duanne Moron) 0.5 MG tablet Take 0.5 mg by mouth 3 (three) times daily.     [provider]  alum & mag hydroxide-simeth (MINTOX) 200-200-20 MG/5ML suspension Take 30 mLs by mouth 4 (four) times daily as needed for indigestion or heartburn.    [provider]  atorvastatin (LIPITOR) 10 MG tablet Take 10 mg by mouth at bedtime.     [provider]  carbidopa-levodopa (SINEMET IR) 25-250 MG tablet Take 1 tablet by mouth daily.     [provider]  Cholecalciferol (VITAMIN D3) 5000 units CAPS Take 5,000 Units by mouth daily at 12 noon.     [provider]  clopidogrel (PLAVIX) 75 MG tablet Take 75 mg by mouth daily at 12  noon.     [provider]  Corn Starch POWD Apply 1 application topically 2 (two) times daily as needed (between skin folds).    [provider]  dicyclomine (BENTYL) 20 MG tablet Take 20 mg by mouth every 4 (four) hours as needed for spasms.    [provider]  diltiazem (CARDIZEM CD) 120 MG 24 hr capsule Take 240 mg by mouth daily.    [provider]  DULoxetine (CYMBALTA) 30 MG capsule Take 30 mg by mouth daily.    [provider]  guaiFENesin (MUCINEX) 600 MG 12 hr tablet Take 600 mg by mouth 2 (two) times daily as needed for cough (and/or congestion).    [provider]  guaiFENesin-codeine (ROBITUSSIN AC) 100-10 MG/5ML syrup Take 10 mLs by mouth  every 4 (four) hours as needed for cough.    [provider]  HYDROcodone-acetaminophen (NORCO/VICODIN) 5-325 MG tablet Take 1 tablet by mouth 2 (two) times daily. Pt also uses every four hours as needed for pain.    [provider]  loperamide (IMODIUM) 2 MG capsule Take 2 mg by mouth as needed for diarrhea or loose stools.    [provider]  losartan (COZAAR) 25 MG tablet Take 25 mg by mouth at bedtime.     [provider]  metFORMIN (GLUCOPHAGE) 500 MG tablet Take 500 mg by mouth 2 (two) times daily before a meal.     [provider]  montelukast (SINGULAIR) 10 MG tablet Take 10 mg by mouth daily at 12 noon.     [provider]  Multiple Vitamin (TAB-A-VITE) TABS Take 1 tablet by mouth daily at 12 noon.     [provider]  oxymetazoline (AFRIN) 0.05 % nasal spray Place 1 spray into both nostrils as needed (for nose bleeds).    [provider]  pantoprazole (PROTONIX) 40 MG tablet Take 40 mg by mouth daily before breakfast.     [provider]  polyethylene glycol (MIRALAX / GLYCOLAX) packet Take 17 g by mouth daily.    [provider]  Propylhexedrine Darcella Gasman) INHA Place 1 each into the nose every 4 (four) hours as needed (for allergies).     [provider]  simethicone (MYLICON) 80 MG chewable tablet Chew 80 mg by mouth 4 (four) times daily as needed for flatulence.    [provider]  sodium chloride (OCEAN) 0.65 % SOLN nasal spray Place 1 spray into both nostrils 4 (four) times daily as needed for congestion.    [provider]  talc (ZEASORB) powder Apply 1 application topically 2 (two) times daily as needed (for irritation).    [provider]  Travoprost, BAK Free, (TRAVATAN) 0.004 % SOLN ophthalmic solution Place 1 drop into both eyes at bedtime.    [provider]    Family History Family History  Problem Relation Age of Onset  . Heart attack  Mother   . Arthritis Brother   . Skin cancer Brother     Social History Social History  Substance Use Topics  . Smoking status: Never Smoker  . Smokeless tobacco: Never Used  . Alcohol use No     Allergies   Comtan [entacapone]; Mirapex [pramipexole dihydrochloride]; Sulfa antibiotics; and Ciprofloxacin   Review of Systems Review of Systems  Constitutional: Negative for fever.  HENT: Negative for congestion.   Eyes: Negative for redness.  Respiratory: Negative for shortness of breath.   Cardiovascular: Negative for chest pain.  Gastrointestinal: Negative  for abdominal pain.  Genitourinary: Negative for dysuria.  Musculoskeletal: Positive for neck pain.  Neurological: Negative for syncope and headaches.  Hematological: Does not bruise/bleed easily.  Psychiatric/Behavioral: Negative for confusion.     Physical Exam Updated Vital Signs BP (!) 148/74 (BP Location: Right Arm)   Pulse 80   Temp 98.2 F (36.8 C) (Oral)   Resp 16   Ht 1.524 m (5')   Wt 88 kg (194 lb)   SpO2 100%   BMI 37.89 kg/m   Physical Exam  Constitutional: She appears well-developed and well-nourished. No distress.  HENT:  Head: Normocephalic and atraumatic.  Mouth/Throat: Oropharynx is clear and moist.  Eyes: Pupils are equal, round, and reactive to light. Conjunctivae and EOM are normal.  Neck:  Cervical collar in place  Cardiovascular: Normal rate and regular rhythm.   Pulmonary/Chest: Effort normal and breath sounds normal.  Abdominal: Soft. Bowel sounds are normal. There is no tenderness.  Musculoskeletal: Normal range of motion. She exhibits no tenderness or deformity.  Neurological: She is alert. No cranial nerve deficit or sensory deficit. She exhibits normal muscle tone. Coordination normal.  Skin: Skin is warm.  Nursing note and vitals reviewed.    ED Treatments / Results  Labs (all labs ordered are listed, but only abnormal results are displayed) Labs Reviewed - No data to  display  EKG  EKG Interpretation None       Radiology Ct Head Wo Contrast  Result Date: 08/07/2017 CLINICAL DATA:  Fall, injury to back of head, laceration to back of head. Dizziness. EXAM: CT HEAD WITHOUT CONTRAST CT CERVICAL SPINE WITHOUT CONTRAST TECHNIQUE: Multidetector CT imaging of the head and cervical spine was performed following the standard protocol without intravenous contrast. Multiplanar CT image reconstructions of the cervical spine were also generated. COMPARISON:  Head CT and cervical spine CT dated 10/11/2016. FINDINGS: CT HEAD FINDINGS Brain: Generalized age related parenchymal atrophy with commensurate dilatation of the ventricles and sulci. Confluent chronic small vessel ischemic change within the bilateral periventricular and subcortical white matter. Small old lacunar infarcts within each basal glandular region. These findings appear stable compared to the previous exam. No mass, hemorrhage, edema or other evidence of acute parenchymal abnormality. No extra-axial hemorrhage. Vascular: There are chronic calcified atherosclerotic changes of the large vessels at the skull base. No unexpected hyperdense vessel. Skull: Normal. Negative for fracture or focal lesion. Sinuses/Orbits: No acute finding. Other: None. CT CERVICAL SPINE FINDINGS Alignment: Mild levoscoliosis. Stable anterolisthesis of C3 and C4 related to underlying degenerative change. No evidence of acute vertebral body subluxation. Skull base and vertebrae: No fracture line or displaced fracture fragment seen. Facet joints appear intact and normally aligned. Stable/chronic mild compression deformity involving the superior endplate of the T2 vertebral body. Soft tissues and spinal canal: No prevertebral fluid or swelling. No visible canal hematoma. Disc levels: Multilevel spondylosis, mild to moderate in degree, most prominent within the lower cervical spine with associated disc space narrowings and disc-osteophytic bulges.  However, no more than mild central canal stenosis at any level. Postsurgical changes of left laminotomy at the C5-C6 level. Upper chest: No acute findings. Other: Carotid atherosclerosis. IMPRESSION: 1. No acute intracranial abnormality. No intracranial mass, hemorrhage or edema. No skull fracture. Chronic ischemic changes within the white matter and basal ganglia. 2. No fracture or acute subluxation within the cervical spine. Degenerative changes throughout the cervical spine, mild to moderate in degree. No significant central canal stenosis at any level. Electronically Signed   By: Cherlynn Kaiser  Enriqueta Shutter M.D.   On: 08/07/2017 08:25   Ct Cervical Spine Wo Contrast  Result Date: 08/07/2017 CLINICAL DATA:  Fall, injury to back of head, laceration to back of head. Dizziness. EXAM: CT HEAD WITHOUT CONTRAST CT CERVICAL SPINE WITHOUT CONTRAST TECHNIQUE: Multidetector CT imaging of the head and cervical spine was performed following the standard protocol without intravenous contrast. Multiplanar CT image reconstructions of the cervical spine were also generated. COMPARISON:  Head CT and cervical spine CT dated 10/11/2016. FINDINGS: CT HEAD FINDINGS Brain: Generalized age related parenchymal atrophy with commensurate dilatation of the ventricles and sulci. Confluent chronic small vessel ischemic change within the bilateral periventricular and subcortical white matter. Small old lacunar infarcts within each basal glandular region. These findings appear stable compared to the previous exam. No mass, hemorrhage, edema or other evidence of acute parenchymal abnormality. No extra-axial hemorrhage. Vascular: There are chronic calcified atherosclerotic changes of the large vessels at the skull base. No unexpected hyperdense vessel. Skull: Normal. Negative for fracture or focal lesion. Sinuses/Orbits: No acute finding. Other: None. CT CERVICAL SPINE FINDINGS Alignment: Mild levoscoliosis. Stable anterolisthesis of C3 and C4 related to  underlying degenerative change. No evidence of acute vertebral body subluxation. Skull base and vertebrae: No fracture line or displaced fracture fragment seen. Facet joints appear intact and normally aligned. Stable/chronic mild compression deformity involving the superior endplate of the T2 vertebral body. Soft tissues and spinal canal: No prevertebral fluid or swelling. No visible canal hematoma. Disc levels: Multilevel spondylosis, mild to moderate in degree, most prominent within the lower cervical spine with associated disc space narrowings and disc-osteophytic bulges. However, no more than mild central canal stenosis at any level. Postsurgical changes of left laminotomy at the C5-C6 level. Upper chest: No acute findings. Other: Carotid atherosclerosis. IMPRESSION: 1. No acute intracranial abnormality. No intracranial mass, hemorrhage or edema. No skull fracture. Chronic ischemic changes within the white matter and basal ganglia. 2. No fracture or acute subluxation within the cervical spine. Degenerative changes throughout the cervical spine, mild to moderate in degree. No significant central canal stenosis at any level. Electronically Signed   By: Franki Cabot M.D.   On: 08/07/2017 08:25    Procedures Procedures (including critical care time)  Medications Ordered in ED Medications - No data to display   Initial Impression / Assessment and Plan / ED Course  I have reviewed the triage vital signs and the nursing notes.  Pertinent labs & imaging results that were available during my care of the patient were reviewed by me and considered in my medical decision making (see chart for details).     CT of head and neck without any acute findings. Patient good range of motion of her neck after removal c-collar. Patient not concerned about any other injuries. Patient had no loss of consciousness. Patient without any pain in her lower extremities. No concern for hip injury.  Final Clinical  Impressions(s) / ED Diagnoses   Final diagnoses:  Fall, initial encounter  Injury of head, initial encounter    New Prescriptions New Prescriptions   No medications on file     Fredia Sorrow, MD 08/07/17 [phone]

## 2017-08-07 NOTE — ED Triage Notes (Signed)
Per EMS: Pt coming from [facility] w/ her husband. Pt went to use bedside commod. Water was on the floor and pt slipped and hit her head on the door. Pt is on Plavix. No LOC. Pt is dizzy at this time. Denies N, V. Denies neck or back pain. Previous fracture in right arm. No laceration noted.

## 2017-10-23 ENCOUNTER — Emergency Department (HOSPITAL_BASED_OUTPATIENT_CLINIC_OR_DEPARTMENT_OTHER)
Admission: EM | Admit: 2017-10-23 | Discharge: 2017-10-23 | Disposition: A | Discharge status: Home / Self Care | Payer: Medicare Other | Attending: Emergency Medicine | Admitting: Emergency Medicine

## 2017-10-23 ENCOUNTER — Encounter (HOSPITAL_BASED_OUTPATIENT_CLINIC_OR_DEPARTMENT_OTHER): Payer: Self-pay | Admitting: Emergency Medicine

## 2017-10-23 ENCOUNTER — Emergency Department (HOSPITAL_BASED_OUTPATIENT_CLINIC_OR_DEPARTMENT_OTHER): Payer: Medicare Other

## 2017-10-23 ENCOUNTER — Other Ambulatory Visit: Payer: Self-pay

## 2017-10-23 DIAGNOSIS — F329 Major depressive disorder, single episode, unspecified: Secondary | ICD-10-CM | POA: Diagnosis not present

## 2017-10-23 DIAGNOSIS — F419 Anxiety disorder, unspecified: Secondary | ICD-10-CM | POA: Insufficient documentation

## 2017-10-23 DIAGNOSIS — W010XXA Fall on same level from slipping, tripping and stumbling without subsequent striking against object, initial encounter: Secondary | ICD-10-CM | POA: Diagnosis not present

## 2017-10-23 DIAGNOSIS — J45909 Unspecified asthma, uncomplicated: Secondary | ICD-10-CM | POA: Diagnosis not present

## 2017-10-23 DIAGNOSIS — Z8673 Personal history of transient ischemic attack (TIA), and cerebral infarction without residual deficits: Secondary | ICD-10-CM | POA: Insufficient documentation

## 2017-10-23 DIAGNOSIS — Z7902 Long term (current) use of antithrombotics/antiplatelets: Secondary | ICD-10-CM | POA: Diagnosis not present

## 2017-10-23 DIAGNOSIS — I10 Essential (primary) hypertension: Secondary | ICD-10-CM | POA: Insufficient documentation

## 2017-10-23 DIAGNOSIS — W19XXXA Unspecified fall, initial encounter: Secondary | ICD-10-CM

## 2017-10-23 DIAGNOSIS — M25562 Pain in left knee: Secondary | ICD-10-CM | POA: Insufficient documentation

## 2017-10-23 DIAGNOSIS — Z79899 Other long term (current) drug therapy: Secondary | ICD-10-CM | POA: Insufficient documentation

## 2017-10-23 DIAGNOSIS — M79672 Pain in left foot: Secondary | ICD-10-CM

## 2017-10-23 DIAGNOSIS — G20 Parkinson's disease: Secondary | ICD-10-CM | POA: Insufficient documentation

## 2017-10-23 NOTE — Discharge Instructions (Addendum)
We believe that your symptoms are caused by musculoskeletal strain.  Please read through the included information about additional care such as heating pads, over-the-counter pain medicine.  If you were provided a prescription please use it only as needed and as instructed.  Remember that early mobility and using the affected part of your body is actually better than keeping it immobile.  Apply an ACE wrap as provided and keep the leg elevated when seated and apply ice for the next 12 hours as needed. After 12 hours you can move to heat. Continue to use the ACE wrap as needed in the coming week. You are able to bear weight on the leg as needed for transferred from the bed to your wheelchair.   Follow-up with the doctor listed as recommended or return to the emergency department with new or worsening symptoms that concern you.

## 2017-10-23 NOTE — ED Provider Notes (Signed)
Emergency Department Provider Note   I have reviewed the triage vital signs and the nursing notes.   HISTORY  Chief Complaint Fall   HPI [name] is a 83 y.o. female presents to the emergency department for evaluation after mechanical fall.  Patient was on the toilet when she slipped when trying to get up and her left leg folded underneath her body in an awkward position.  She is primarily having pain in the left foot but also having some discomfort in the left knee.  No head injury or loss of consciousness.  No pain in the neck or lower back.  No hip discomfort.  She lives at an assisted living and so was able to call for help.  She typically gets around her apartment in a wheelchair and wears a life alert bracelet.  Pain is moderate and worse with movement.  No radiation of symptoms.   Past Medical History:  Diagnosis Date  . Abnormality of gait 02/05/2015  . Anxiety   . Arthritis   . Asthma   . Cerebrovascular disease    CVA '03  . Depression   . Dyslipidemia   . Endometriosis   . GERD (gastroesophageal reflux disease)   . Glaucoma   . Headache(784.0)   . Hypertension   . Macular degeneration    bilateral  . Obesity   . Paralysis agitans ([place]) 08/31/2013  . Parkinson disease ([place])   . TIA (transient ischemic attack)    Multiple    Patient Active Problem List   Diagnosis Date Noted  . Tachycardia 08/12/2015  . Abnormality of gait 02/05/2015  . Palpitations 05/28/2014  . TIA (transient ischemic attack) 05/25/2014  . Dyslipidemia 05/25/2014  . Parkinson disease ([place]) 05/25/2014  . H/O: CVA (cerebrovascular accident) 05/25/2014  . Paralysis agitans ([place]) 08/31/2013  . Dizziness and giddiness 08/31/2013    Past Surgical History:  Procedure Laterality Date  . ABDOMINAL HYSTERECTOMY    . CATARACT EXTRACTION, BILATERAL    . GALLBLADDER SURGERY    . LOOP RECORDER IMPLANT  05-28-14   MDT LINQ implanted by Dr Caryl Comes for cryptogenic stroke/palpitations  .  LOOP RECORDER IMPLANT N/A 05/28/2014   Procedure: LOOP RECORDER IMPLANT;  Surgeon: Deboraha Sprang, MD;  Location: [facility] CATH LAB;  Service: Cardiovascular;  Laterality: N/A;  . PILONIDAL CYST RESECTION    . TILT TABLE STUDY  05-28-14   orthostatic hypertension  . TILT TABLE STUDY N/A 05/28/2014   Procedure: TILT TABLE STUDY;  Surgeon: Deboraha Sprang, MD;  Location[facility] CATH LAB;  Service: Cardiovascular;  Laterality: N/A;  . TONSILLECTOMY      Current Outpatient Rx  . Order #: [id number] Class: Historical Med  . Order #: [id number] Class: Historical Med  . Order #: [id number] Class: Historical Med  . Order #: [id number] Class: Historical Med  . Order #: [id number] Class: Historical Med  . Order #: [id number] Class: Historical Med  . Order #: [id number] Class: Historical Med  . Order #: [id number] Class: Historical Med  . Order #: [id number] Class: Historical Med  . Order #: [id number] Class: Historical Med  . Order #: [id number] Class: Historical Med  . Order #: [id number] Class: Historical Med  . Order #: [id number] Class: Historical Med  . Order #: [id number] Class: Historical Med  . Order #: [id number] Class: Historical Med  . Order #: [id number] Class: Historical Med  . Order #: [id number] Class: Historical Med  . Order #: [id number] Class: Historical Med  . Order #: [id number] Class: Historical Med  . Order #: [id number] Class: Historical Med  . Order #: [id number] Class:  Historical Med  . Order #: [id number] Class: Historical Med  . Order #: [id number] Class: Historical Med  . Order #: [id number] Class: Historical Med  . Order #: [id number] Class: Historical Med  . Order #: [id number] Class: Historical Med  . Order #: [id number] Class: Historical Med  . Order #: [id number] Class: Historical Med  . Order #: [id number] Class: Historical Med  . Order #: [id number] Class: Historical Med  . Order #: [id number] Class: Historical Med  . Order #: [id number] Class: Historical Med    Allergies Comtan [entacapone]; Mirapex [pramipexole  dihydrochloride]; Sulfa antibiotics; and Ciprofloxacin  Family History  Problem Relation Age of Onset  . Heart attack Mother   . Arthritis Brother   . Skin cancer Brother     Social History Social History   Tobacco Use  . Smoking status: Never Smoker  . Smokeless tobacco: Never Used  Substance Use Topics  . Alcohol use: No  . Drug use: No    Review of Systems  Constitutional: No fever/chills Eyes: No visual changes. ENT: No sore throat. Cardiovascular: Denies chest pain. Respiratory: Denies shortness of breath. Gastrointestinal: No abdominal pain.  No nausea, no vomiting.  No diarrhea.  No constipation. Genitourinary: Negative for dysuria. Musculoskeletal: Negative for back pain. Positive left leg/foot pain.  Skin: Negative for rash. Neurological: Negative for headaches, focal weakness or numbness.  10-point ROS otherwise negative.  ____________________________________________   PHYSICAL EXAM:  VITAL SIGNS: ED Triage Vitals  Enc Vitals Group     BP 10/23/17 1251 122/65     Pulse Rate 10/23/17 1251 (!) 116     Resp 10/23/17 1251 (!) 22     Temp 10/23/17 1251 97.8 F (36.6 C)     Temp Source 10/23/17 1251 Oral     SpO2 10/23/17 1251 94 %     Weight 10/23/17 1250 194 lb (88 kg)     Height 10/23/17 1250 5' (1.524 m)     Pain Score 10/23/17 1250 10    Constitutional: Alert and oriented. Well appearing and in no acute distress. Eyes: Conjunctivae are normal.  Head: Atraumatic. Nose: No congestion/rhinnorhea. Mouth/Throat: Mucous membranes are moist.  Neck: No stridor.  Cardiovascular: Normal rate, regular rhythm. Good peripheral circulation. Grossly normal heart sounds.   Respiratory: Normal respiratory effort.  No retractions. Lungs CTAB. Gastrointestinal: Soft and nontender. No distention.  Musculoskeletal: Left foot swelling with mild left lateral knee tenderness to palpation. No ankle edema or tenderness to palpation. Passive ROM of bilateral hips without  pain.  Neurologic:  Normal speech and language. No gross focal neurologic deficits are appreciated.  Skin:  Skin is warm, dry and intact. No rash noted.  ____________________________________________  RADIOLOGY  Dg Foot Complete Left  Result Date: 10/23/2017 CLINICAL DATA:  Left foot pain after fall today. EXAM: LEFT FOOT - COMPLETE 3+ VIEW COMPARISON:  Radiographs of December 10, 2004. FINDINGS: There is no evidence of acute fracture or dislocation. There is no evidence of arthropathy or other focal bone abnormality. Soft tissues are unremarkable. IMPRESSION: No significant abnormality seen in the left foot. Electronically Signed   By: Marijo Conception, M.D.   On: 10/23/2017 13:25    ____________________________________________   PROCEDURES  Procedure(s) performed:   Procedures  None ____________________________________________   INITIAL IMPRESSION / ASSESSMENT AND PLAN / ED COURSE  Pertinent labs & imaging results that were available during my care of the patient were reviewed by me and considered in my medical decision making (see chart for details).  She presents to the emergency department for evaluation after  mechanical fall with pain in the left foot and some in the left knee.  No left ankle tenderness to palpation of the area.  No deformity, bruising, neurovascular compromise.  Soft compartments.  Patient has some mild proximal fibular tenderness.  I will send patient back to x-ray to evaluate this area.   X-ray of the knee is negative. Plan for RICE and PCP follow up.   At this time, I do not feel there is any life-threatening condition present. I have reviewed and discussed all results (EKG, imaging, lab, urine as appropriate), exam findings with patient. I have reviewed nursing notes and appropriate previous records.  I feel the patient is safe to be discharged home without further emergent workup. Discussed usual and customary return precautions. Patient and family (if  present) verbalize understanding and are comfortable with this plan.  Patient will follow-up with their primary care provider. If they do not have a primary care provider, information for follow-up has been provided to them. All questions have been answered.  ____________________________________________  FINAL CLINICAL IMPRESSION(S) / ED DIAGNOSES  Final diagnoses:  Left knee pain    Note:  This document was prepared using Dragon voice recognition software and may include unintentional dictation errors.  Nanda Quinton, MD Emergency Medicine    Deshawnda Acrey, [facility], MD 10/23/17 [phone]

## 2017-10-23 NOTE — ED Triage Notes (Signed)
Pt states she fell when standing up from the toilet and landed with her L leg in an awkward position. Pt c/o L foot pain.

## 2017-10-23 NOTE — ED Notes (Addendum)
Pt taken back to assisted living by family [place], [place]). D/c instructions reviewed and provided for facility

## 2017-10-23 NOTE — ED Notes (Signed)
Pt being taken back to assisted living by family

## 2017-10-29 ENCOUNTER — Encounter (HOSPITAL_COMMUNITY): Payer: Self-pay | Admitting: Emergency Medicine

## 2017-10-29 ENCOUNTER — Emergency Department (HOSPITAL_COMMUNITY)
Admission: EM | Admit: 2017-10-29 | Discharge: 2017-10-30 | Disposition: A | Discharge status: Skilled Nursing Facility | Payer: Medicare Other | Attending: Emergency Medicine | Admitting: Emergency Medicine

## 2017-10-29 ENCOUNTER — Other Ambulatory Visit: Payer: Self-pay

## 2017-10-29 ENCOUNTER — Emergency Department (HOSPITAL_COMMUNITY): Payer: Medicare Other

## 2017-10-29 DIAGNOSIS — Z8673 Personal history of transient ischemic attack (TIA), and cerebral infarction without residual deficits: Secondary | ICD-10-CM | POA: Diagnosis not present

## 2017-10-29 DIAGNOSIS — J45909 Unspecified asthma, uncomplicated: Secondary | ICD-10-CM | POA: Insufficient documentation

## 2017-10-29 DIAGNOSIS — W1789XA Other fall from one level to another, initial encounter: Secondary | ICD-10-CM | POA: Diagnosis not present

## 2017-10-29 DIAGNOSIS — S0003XA Contusion of scalp, initial encounter: Secondary | ICD-10-CM

## 2017-10-29 DIAGNOSIS — W19XXXA Unspecified fall, initial encounter: Secondary | ICD-10-CM

## 2017-10-29 DIAGNOSIS — Y9389 Activity, other specified: Secondary | ICD-10-CM | POA: Insufficient documentation

## 2017-10-29 DIAGNOSIS — Y929 Unspecified place or not applicable: Secondary | ICD-10-CM | POA: Insufficient documentation

## 2017-10-29 DIAGNOSIS — I10 Essential (primary) hypertension: Secondary | ICD-10-CM | POA: Diagnosis not present

## 2017-10-29 DIAGNOSIS — R51 Headache: Secondary | ICD-10-CM | POA: Insufficient documentation

## 2017-10-29 DIAGNOSIS — Y999 Unspecified external cause status: Secondary | ICD-10-CM | POA: Insufficient documentation

## 2017-10-29 DIAGNOSIS — G20 Parkinson's disease: Secondary | ICD-10-CM | POA: Diagnosis not present

## 2017-10-29 NOTE — ED Provider Notes (Signed)
[facility] DEPT Provider Note   CSN: [id number] Arrival date & time: 10/29/17  2044     History   Chief Complaint Chief Complaint  Patient presents with  . Fall    HPI [name] is a 83 y.o. female.  Patient with history of Parkinson's, multiple falls, CVA, HTN, macular degeneration presents after fall. Earlier today she was moving from the lift chair to her bed, using another piece of furniture for leverage when the furniture moved and she fell to the floor. She feels she may have hit her head. No LOC, neck pain, chest or abdominal pain. No nausea or vomiting.    The history is provided by the patient. No language interpreter was used.  Fall  Associated symptoms include headaches (Mild). Pertinent negatives include no chest pain, no abdominal pain and no shortness of breath.    Past Medical History:  Diagnosis Date  . Abnormality of gait 02/05/2015  . Anxiety   . Arthritis   . Asthma   . Cerebrovascular disease    CVA '03  . Depression   . Dyslipidemia   . Endometriosis   . GERD (gastroesophageal reflux disease)   . Glaucoma   . Headache(784.0)   . Hypertension   . Macular degeneration    bilateral  . Obesity   . Paralysis agitans ([place]) 08/31/2013  . Parkinson disease ([place])   . TIA (transient ischemic attack)    Multiple    Patient Active Problem List   Diagnosis Date Noted  . Tachycardia 08/12/2015  . Abnormality of gait 02/05/2015  . Palpitations 05/28/2014  . TIA (transient ischemic attack) 05/25/2014  . Dyslipidemia 05/25/2014  . Parkinson disease ([place]) 05/25/2014  . H/O: CVA (cerebrovascular accident) 05/25/2014  . Paralysis agitans ([place]) 08/31/2013  . Dizziness and giddiness 08/31/2013    Past Surgical History:  Procedure Laterality Date  . ABDOMINAL HYSTERECTOMY    . CATARACT EXTRACTION, BILATERAL    . GALLBLADDER SURGERY    . LOOP RECORDER IMPLANT  05-28-14   MDT LINQ implanted by Dr Caryl Comes for  cryptogenic stroke/palpitations  . LOOP RECORDER IMPLANT N/A 05/28/2014   Procedure: LOOP RECORDER IMPLANT;  Surgeon: Deboraha Sprang, MD;  Location: [facility] CATH LAB;  Service: Cardiovascular;  Laterality: N/A;  . PILONIDAL CYST RESECTION    . TILT TABLE STUDY  05-28-14   orthostatic hypertension  . TILT TABLE STUDY N/A 05/28/2014   Procedure: TILT TABLE STUDY;  Surgeon: Deboraha Sprang, MD;  Location: [facility] CATH LAB;  Service: Cardiovascular;  Laterality: N/A;  . TONSILLECTOMY      OB History    No data available       Home Medications    Prior to Admission medications   Medication Sig Start Date End Date Taking? Authorizing Provider  acetaminophen (TYLENOL) 325 MG tablet Take 325 mg by mouth every 4 (four) hours as needed for mild pain or fever.    Yes [provider]  ALPRAZolam Duanne Moron) 0.5 MG tablet Take 0.5 mg by mouth 3 (three) times daily.    Yes [provider]  alum & mag hydroxide-simeth (MINTOX) 200-200-20 MG/5ML suspension Take 30 mLs by mouth 4 (four) times daily as needed for indigestion or heartburn.   Yes [provider]  atorvastatin (LIPITOR) 10 MG tablet Take 10 mg by mouth at bedtime.    Yes [provider]  benzonatate (TESSALON) 100 MG capsule Take 100 mg by mouth 3 (three) times daily as needed for cough.  Yes [provider]  carbidopa-levodopa (SINEMET IR) 25-250 MG tablet Take 1 tablet by mouth daily.    Yes [provider]  Cholecalciferol (VITAMIN D3) 5000 units CAPS Take 5,000 Units by mouth daily at 12 noon.    Yes [provider]  clopidogrel (PLAVIX) 75 MG tablet Take 75 mg by mouth daily at 12 noon.    Yes [provider]  Corn Starch POWD Apply 1 application topically 2 (two) times daily as needed (between skin folds).   Yes [provider]  dicyclomine (BENTYL) 20 MG tablet Take 20 mg by mouth every 4 (four) hours as needed for spasms.   Yes [provider]  diltiazem  (CARDIZEM CD) 120 MG 24 hr capsule Take 240 mg by mouth daily.   Yes [provider]  DULoxetine (CYMBALTA) 30 MG capsule Take 30 mg by mouth daily.   Yes [provider]  glucose blood test strip 1 each by Other route as needed for other ([place]). Use as instructed   Yes [provider]  guaiFENesin (MUCINEX) 600 MG 12 hr tablet Take 600 mg by mouth 2 (two) times daily as needed for cough (and/or congestion).   Yes [provider]  guaiFENesin-codeine (ROBITUSSIN AC) 100-10 MG/5ML syrup Take 10 mLs by mouth every 4 (four) hours as needed for cough.   Yes [provider]  guaiFENesin-dextromethorphan (ROBITUSSIN DM) 100-10 MG/5ML syrup Take 10 mLs by mouth every 4 (four) hours as needed for cough.   Yes [provider]  loperamide (IMODIUM) 2 MG capsule Take 2 mg by mouth as needed for diarrhea or loose stools.   Yes [provider]  loratadine (CLARITIN) 10 MG tablet Take 10 mg by mouth daily as needed for allergies.   Yes [provider]  losartan (COZAAR) 25 MG tablet Take 25 mg by mouth at bedtime.    Yes [provider]  Melatonin 3 MG CAPS Take 3 mg by mouth at bedtime.   Yes [provider]  metFORMIN (GLUCOPHAGE) 500 MG tablet Take 500 mg by mouth 2 (two) times daily before a meal.    Yes [provider]  montelukast (SINGULAIR) 10 MG tablet Take 10 mg by mouth daily at 12 noon.    Yes [provider]  Multiple Vitamin (TAB-A-VITE) TABS Take 1 tablet by mouth daily at 12 noon.    Yes [provider]  oxyCODONE (OXY IR/ROXICODONE) 5 MG immediate release tablet Take 5 mg by mouth 2 (two) times daily.   Yes [provider]  oxymetazoline (AFRIN) 0.05 % nasal spray Place 1 spray into both nostrils as needed (for nose bleeds).   Yes [provider]  pantoprazole (PROTONIX) 40 MG tablet Take 40 mg by mouth daily before  breakfast.    Yes [provider]  polyethylene glycol (MIRALAX / GLYCOLAX) packet Take 17 g by mouth daily as needed for mild constipation or moderate constipation.    Yes [provider]  Propylhexedrine Darcella Gasman) INHA Place 1 each into the nose every 4 (four) hours as needed (for allergies).    Yes [provider]  simethicone (MYLICON) 80 MG chewable tablet Chew 80 mg by mouth 4 (four) times daily as needed for flatulence.   Yes [provider]  sodium chloride (OCEAN) 0.65 % SOLN nasal spray Place 1 spray into both nostrils 4 (four) times daily as needed for congestion.   Yes [provider]  talc (ZEASORB)  powder Apply 1 application topically 2 (two) times daily as needed (for irritation).   Yes [provider]  Travoprost, BAK Free, (TRAVATAN) 0.004 % SOLN ophthalmic solution Place 1 drop into both eyes at bedtime.   Yes [provider]    Family History Family History  Problem Relation Age of Onset  . Heart attack Mother   . Arthritis Brother   . Skin cancer Brother     Social History Social History   Tobacco Use  . Smoking status: Never Smoker  . Smokeless tobacco: Never Used  Substance Use Topics  . Alcohol use: No  . Drug use: No     Allergies   Comtan [entacapone]; Mirapex [pramipexole dihydrochloride]; Sulfa antibiotics; and Ciprofloxacin   Review of Systems Review of Systems  Constitutional: Negative for diaphoresis.  Respiratory: Negative.  Negative for shortness of breath.   Cardiovascular: Negative.  Negative for chest pain.  Gastrointestinal: Negative.  Negative for abdominal pain and nausea.  Musculoskeletal: Negative.  Negative for back pain and neck pain.  Skin: Negative.   Neurological: Positive for headaches (Mild). Negative for syncope and weakness.  Psychiatric/Behavioral: Negative for confusion.     Physical Exam Updated Vital Signs BP (!) 145/68 (BP Location: Left Arm)   Pulse  82   Temp 98 F (36.7 C) (Oral)   Resp 18   SpO2 93%   Physical Exam  Constitutional: She is oriented to person, place, and time. She appears well-developed and well-nourished. No distress.  HENT:  Head: Normocephalic.  No scalp wound, swelling or hematoma. No focal area of tenderness.   Eyes: Pupils are equal, round, and reactive to light.  Neck: Normal range of motion.  Pulmonary/Chest: Effort normal. She exhibits no tenderness.  Abdominal: There is no tenderness.  Musculoskeletal: She exhibits no deformity.  No midline or paraspinal tenderness. Full, pain free ROM of the neck. Right UE movement limited only by known, previous fracture to upper arm.   Neurological: She is alert and oriented to person, place, and time. No sensory deficit. She exhibits normal muscle tone. Coordination normal.  Skin: Skin is warm and dry. She is not diaphoretic.  Psychiatric: She has a normal mood and affect.     ED Treatments / Results  Labs (all labs ordered are listed, but only abnormal results are displayed) Labs Reviewed - No data to display  EKG  EKG Interpretation None       Radiology No results found.  Procedures Procedures (including critical care time)  Medications Ordered in ED Medications - No data to display   Initial Impression / Assessment and Plan / ED Course  I have reviewed the triage vital signs and the nursing notes.  Pertinent labs & imaging results that were available during my care of the patient were reviewed by me and considered in my medical decision making (see chart for details).     Patient has a history of Parkinson's with multiple falls, here for evaluation of mechanical fall from lift chair. No obvious injury on physical exam. She is on Plavix. Will obtain CT head.  CT head is negative for visualized acute injury. There is no change in mental status on re-examination. The patient remains at baseline and comfortable. She can be [facility] to  [facility].  Final Clinical Impressions(s) / ED Diagnoses   Final diagnoses:  None   1. Mechanical fall   ED Discharge Orders    None       Charlann Lange, Hershal Coria 10/29/17 2334  Dorie Rank, MD 10/30/17 [phone]

## 2017-10-29 NOTE — ED Triage Notes (Signed)
Pt from [facility] s/p fall. Patient was transferring herself from her lift chair to her walker. The brake failed on her walker and it slid from under her. Patient hit top of her head on something when she fell, small abrasion noted, no bleeding. Patient has no complaints of neck, back or head pain. Patient has a chronic midline humerus  fx (4 years), patient is on plavix.

## 2017-10-30 NOTE — ED Notes (Signed)
[facility] called for transport.  

## 2017-11-17 ENCOUNTER — Emergency Department (HOSPITAL_COMMUNITY)
Admission: EM | Admit: 2017-11-17 | Discharge: 2017-11-17 | Disposition: A | Discharge status: Home / Self Care | Payer: Medicare Other | Attending: Emergency Medicine | Admitting: Emergency Medicine

## 2017-11-17 ENCOUNTER — Emergency Department (HOSPITAL_COMMUNITY): Payer: Medicare Other

## 2017-11-17 DIAGNOSIS — M25531 Pain in right wrist: Secondary | ICD-10-CM

## 2017-11-17 DIAGNOSIS — Y999 Unspecified external cause status: Secondary | ICD-10-CM | POA: Diagnosis not present

## 2017-11-17 DIAGNOSIS — W1830XA Fall on same level, unspecified, initial encounter: Secondary | ICD-10-CM | POA: Insufficient documentation

## 2017-11-17 DIAGNOSIS — Z79899 Other long term (current) drug therapy: Secondary | ICD-10-CM | POA: Diagnosis not present

## 2017-11-17 DIAGNOSIS — Y929 Unspecified place or not applicable: Secondary | ICD-10-CM | POA: Diagnosis not present

## 2017-11-17 DIAGNOSIS — M25561 Pain in right knee: Secondary | ICD-10-CM | POA: Insufficient documentation

## 2017-11-17 DIAGNOSIS — W19XXXA Unspecified fall, initial encounter: Secondary | ICD-10-CM

## 2017-11-17 DIAGNOSIS — S80211A Abrasion, right knee, initial encounter: Secondary | ICD-10-CM | POA: Diagnosis not present

## 2017-11-17 DIAGNOSIS — T07XXXA Unspecified multiple injuries, initial encounter: Secondary | ICD-10-CM | POA: Diagnosis present

## 2017-11-17 DIAGNOSIS — Y939 Activity, unspecified: Secondary | ICD-10-CM | POA: Diagnosis not present

## 2017-11-17 DIAGNOSIS — G20 Parkinson's disease: Secondary | ICD-10-CM | POA: Diagnosis not present

## 2017-11-17 NOTE — ED Notes (Signed)
Bed: WA10 Expected date:  Expected time:  Means of arrival:  Comments: EMS fall 

## 2017-11-17 NOTE — ED Triage Notes (Signed)
Per EMS, pt is coming from assisted living after experiencing a fall. Pt was on the couch trying to transfer to her wheelchair and fell on her right knee. Pt tried to catch herself with her right hand. Pt complaining of 2/10 right knee and right wrist pain. Per EMS, pt was able to ambulate with assistance. EMS states that right arm is contracted at baseline due to a fracture in the past. Pt is AO x4 and ambulates using a wheelchair. Pt has a hx of dementia, diabetes, and Parkinson's.

## 2017-11-17 NOTE — ED Provider Notes (Signed)
[facility] DEPT Provider Note   CSN: [id number] Arrival date & time: 11/17/17  0857     History   Chief Complaint Chief Complaint  Patient presents with  . Fall    HPI [name] is a 84 y.o. female.  HPI Patient is an 85 year old female presents the emergency department after a fall today when she was transferring to her walker.  She tried to catch herself but fell to the ground.  She injured her right wrist as well as her right knee.  Her pain is mild to moderate in severity and worse with range of motion of her right wrist.  Denies head injury or neck pain.  No fevers or chills.  Denies chest pain abdominal pain.  Reports no weakness of her arms or legs.   Past Medical History:  Diagnosis Date  . Abnormality of gait 02/05/2015  . Anxiety   . Arthritis   . Asthma   . Cerebrovascular disease    CVA '03  . Depression   . Dyslipidemia   . Endometriosis   . GERD (gastroesophageal reflux disease)   . Glaucoma   . Headache(784.0)   . Hypertension   . Macular degeneration    bilateral  . Obesity   . Paralysis agitans ([place]) 08/31/2013  . Parkinson disease ([place])   . TIA (transient ischemic attack)    Multiple    Patient Active Problem List   Diagnosis Date Noted  . Tachycardia 08/12/2015  . Abnormality of gait 02/05/2015  . Palpitations 05/28/2014  . TIA (transient ischemic attack) 05/25/2014  . Dyslipidemia 05/25/2014  . Parkinson disease ([place]) 05/25/2014  . H/O: CVA (cerebrovascular accident) 05/25/2014  . Paralysis agitans ([place]) 08/31/2013  . Dizziness and giddiness 08/31/2013    Past Surgical History:  Procedure Laterality Date  . ABDOMINAL HYSTERECTOMY    . CATARACT EXTRACTION, BILATERAL    . GALLBLADDER SURGERY    . LOOP RECORDER IMPLANT  05-28-14   MDT LINQ implanted by Dr Caryl Comes for cryptogenic stroke/palpitations  . LOOP RECORDER IMPLANT N/A 05/28/2014   Procedure: LOOP RECORDER IMPLANT;  Surgeon: Deboraha Sprang,  MD;  Location: [facility] CATH LAB;  Service: Cardiovascular;  Laterality: N/A;  . PILONIDAL CYST RESECTION    . TILT TABLE STUDY  05-28-14   orthostatic hypertension  . TILT TABLE STUDY N/A 05/28/2014   Procedure: TILT TABLE STUDY;  Surgeon: Deboraha Sprang, MD;  Location: [facility] CATH LAB;  Service: Cardiovascular;  Laterality: N/A;  . TONSILLECTOMY      OB History    No data available       Home Medications    Prior to Admission medications   Medication Sig Start Date End Date Taking? Authorizing Provider  acetaminophen (TYLENOL) 325 MG tablet Take 325 mg by mouth every 4 (four) hours as needed for mild pain or fever.    Yes [provider]  albuterol (VENTOLIN HFA) 108 (90 Base) MCG/ACT inhaler Inhale 2 puffs into the lungs every 6 (six) hours as needed for wheezing or shortness of breath.   Yes [provider]  ALPRAZolam Duanne Moron) 0.5 MG tablet Take 0.5 mg by mouth 3 (three) times daily.    Yes [provider]  alum & mag hydroxide-simeth (MINTOX) 200-200-20 MG/5ML suspension Take 30 mLs by mouth 4 (four) times daily as needed for indigestion or heartburn.   Yes [provider]  atorvastatin (LIPITOR) 10 MG tablet Take 10 mg by mouth at bedtime.    Yes  [provider]  benzonatate (TESSALON) 100 MG capsule Take 100 mg by mouth 3 (three) times daily as needed for cough.   Yes [provider]  carbidopa-levodopa (SINEMET IR) 25-250 MG tablet Take 1 tablet by mouth daily.    Yes [provider]  Cholecalciferol (VITAMIN D3) 5000 units CAPS Take 5,000 Units by mouth daily at 12 noon.    Yes [provider]  clopidogrel (PLAVIX) 75 MG tablet Take 75 mg by mouth daily at 12 noon.    Yes [provider]  Corn Starch POWD Apply 1 application topically 2 (two) times daily as needed (between skin folds).   Yes [provider]  dicyclomine (BENTYL) 20 MG tablet Take 20 mg by mouth every 4 (four) hours as needed for  spasms.   Yes [provider]  diltiazem (CARDIZEM CD) 120 MG 24 hr capsule Take 240 mg by mouth daily.   Yes [provider]  DULoxetine (CYMBALTA) 30 MG capsule Take 30 mg by mouth daily.   Yes [provider]  guaiFENesin (MUCINEX) 600 MG 12 hr tablet Take 600 mg by mouth 2 (two) times daily as needed for cough (and/or congestion).   Yes [provider]  guaiFENesin-codeine (ROBITUSSIN AC) 100-10 MG/5ML syrup Take 10 mLs by mouth every 4 (four) hours as needed for cough.   Yes [provider]  guaiFENesin-dextromethorphan (ROBITUSSIN DM) 100-10 MG/5ML syrup Take 10 mLs by mouth every 4 (four) hours as needed for cough.   Yes [provider]  loperamide (IMODIUM) 2 MG capsule Take 2 mg by mouth as needed for diarrhea or loose stools.   Yes [provider]  loratadine (CLARITIN) 10 MG tablet Take 10 mg by mouth daily as needed for allergies.   Yes [provider]  losartan (COZAAR) 25 MG tablet Take 25 mg by mouth at bedtime.    Yes [provider]  Melatonin 3 MG CAPS Take 3 mg by mouth at bedtime.   Yes [provider]  metFORMIN (GLUCOPHAGE) 500 MG tablet Take 500 mg by mouth 2 (two) times daily before a meal.    Yes [provider]  montelukast (SINGULAIR) 10 MG tablet Take 10 mg by mouth daily at 12 noon.    Yes [provider]  Multiple Vitamin (TAB-A-VITE) TABS Take 1 tablet by mouth daily at 12 noon.    Yes [provider]  oxyCODONE (OXY IR/ROXICODONE) 5 MG immediate release tablet Take 2.5 mg by mouth 2 (two) times daily.    Yes [provider]  oxymetazoline (AFRIN) 0.05 % nasal spray Place 1 spray into both nostrils as needed (for nose bleeds).   Yes [provider]  pantoprazole (PROTONIX) 40 MG tablet Take 40 mg by mouth daily before breakfast.    Yes [provider]  polyethylene glycol (MIRALAX / GLYCOLAX) packet Take 17 g by mouth daily as  needed for mild constipation or moderate constipation.    Yes [provider]  promethazine (PHENERGAN) 25 MG tablet Take 25 mg by mouth every 6 (six) hours as needed for nausea or vomiting.   Yes [provider]  simethicone (MYLICON) 80 MG chewable tablet Chew 80 mg by mouth 4 (four) times daily as needed for flatulence.   Yes [provider]  sodium chloride (OCEAN) 0.65 % SOLN nasal spray Place 1 spray into both nostrils 4 (four) times daily as needed for congestion.   Yes [provider]  Travoprost, BAK Free, (TRAVATAN)  0.004 % SOLN ophthalmic solution Place 1 drop into both eyes at bedtime.   Yes [provider]  glucose blood test strip 1 each by Other route as needed for other ([place]). Use as instructed    [provider]  talc (ZEASORB) powder Apply 1 application topically 2 (two) times daily as needed (for irritation).    [provider]    Family History Family History  Problem Relation Age of Onset  . Heart attack Mother   . Arthritis Brother   . Skin cancer Brother     Social History Social History   Tobacco Use  . Smoking status: Never Smoker  . Smokeless tobacco: Never Used  Substance Use Topics  . Alcohol use: No  . Drug use: No     Allergies   Comtan [entacapone]; Mirapex [pramipexole dihydrochloride]; Sulfa antibiotics; and Ciprofloxacin   Review of Systems Review of Systems  All other systems reviewed and are negative.    Physical Exam Updated Vital Signs BP (!) 145/81   Pulse 98   Temp 97.9 F (36.6 C) (Oral)   Resp 15   SpO2 94%   Physical Exam  Constitutional: She is oriented to person, place, and time. She appears well-developed and well-nourished.  HENT:  Head: Normocephalic.  Eyes: EOM are normal.  Neck: Normal range of motion.  Cardiovascular: Normal rate.  Pulmonary/Chest: Effort normal.  Abdominal: She exhibits no distension.    Musculoskeletal: Normal range of motion.  Mild pain with range of motion of the right knee with small abrasion overlying the right patella.  No obvious right knee deformity.  Normal pulses right foot.  Full range of motion of bilateral hips and bilateral ankles.  Normal range of motion of left knee.  Full range of motion of bilateral shoulders and elbows.  Normal range of motion of the left wrist.  Mild painful range of motion the right wrist with swelling and tenderness over the distal right radius without obvious deformity.  Normal right radial pulse.  Neurological: She is alert and oriented to person, place, and time.  Psychiatric: She has a normal mood and affect.  Nursing note and vitals reviewed.    ED Treatments / Results  Labs (all labs ordered are listed, but only abnormal results are displayed) Labs Reviewed - No data to display  EKG  EKG Interpretation None       Radiology Dg Wrist Complete Right  Result Date: 11/17/2017 CLINICAL DATA:  Pain following fall EXAM: RIGHT WRIST - COMPLETE 3+ VIEW COMPARISON:  November 07, 2015 FINDINGS: Frontal, oblique lateral, and ulnar deviation scaphoid images were obtained. Bones are diffusely osteoporotic. No acute fracture or dislocation is evident. There is osteoarthritic change in the radiocarpal joint as well as in the scaphotrapezial joint. There is also mild narrowing of all MCP joints. No erosive change. IMPRESSION: Diffuse osteoporosis. Areas of osteoarthritic change. No acute fracture or dislocation. Electronically Signed   By: Lowella Grip III M.D.   On: 11/17/2017 10:00   Dg Knee Complete 4 Views Right  Result Date: 11/17/2017 CLINICAL DATA:  Fall this morning with right anterior knee pain. EXAM: RIGHT KNEE - COMPLETE 4+ VIEW COMPARISON:  None. FINDINGS: Mild degenerate change of the patellofemoral joint. No evidence of fracture or dislocation. No definite joint effusion. IMPRESSION: No acute findings. Electronically Signed    By: Marin Olp M.D.   On: 11/17/2017 10:00    Procedures Procedures (including critical care time)  Medications  Ordered in ED Medications - No data to display   Initial Impression / Assessment and Plan / ED Course  I have reviewed the triage vital signs and the nursing notes.  Pertinent labs & imaging results that were available during my care of the patient were reviewed by me and considered in my medical decision making (see chart for details).     Imaging of the right knee and right wrist is without obvious traumatic abnormality.  Patient placed in the right Velcro thumb spica.  Primary care follow-up.  She understands return to the ER for new or worsening symptoms.  If patient has persistent pain in the right wrist in 1 week she will likely need repeat x-rays to evaluate for occult fracture.  Final Clinical Impressions(s) / ED Diagnoses   Final diagnoses:  None    ED Discharge Orders    None       Jola Schmidt, MD 11/17/17 1414

## 2017-11-24 ENCOUNTER — Encounter: Payer: Self-pay | Admitting: Neurology

## 2017-11-24 ENCOUNTER — Ambulatory Visit: Payer: Medicare Other | Admitting: Neurology

## 2017-11-24 VITALS — BP 142/80 | HR 99

## 2017-11-24 DIAGNOSIS — G20 Parkinson's disease: Secondary | ICD-10-CM | POA: Diagnosis not present

## 2017-11-24 DIAGNOSIS — R269 Unspecified abnormalities of gait and mobility: Secondary | ICD-10-CM

## 2017-11-24 MED ORDER — CARBIDOPA-LEVODOPA ER 25-100 MG PO TBCR: 1.0000 | EXTENDED_RELEASE_TABLET | Freq: Three times a day (TID) | ORAL | 3 refills | Status: DC

## 2017-11-24 NOTE — Progress Notes (Signed)
Reason for visit: Parkinson's disease  [name] is a 84 y.o. female  History of present illness:  Ms. [name] is an 84 year old right-handed white female with a history of Parkinson's disease.  The patient has not been seen in over 2.5 years.  The patient has had multiple falls recently.  She has fallen on 11 October 2016, August 07, 2017, October 23, 2017, October 29, 2017, and the most recent emergency room visit was on 17 November 2017.  The patient in the past has fractured her right upper arm, this was never surgically repaired and she has minimal use of the right arm.  She comes in today with her daughter.  The patient has been in an assisted living situation, the patient is in [place].  The patient currently is nonambulatory, she is restricted to a wheelchair.  At times she may fall out of bed when trying to get to the wheelchair on her own.  She is having some memory problems, she may have some confusion early in the morning.  She has frequent urinary tract infections.  She has had significant alteration in her Sinemet dosing.  When she was seen here last, she was on Sinemet 25/250 taking 1 tablet 3 times daily, she was taking Sinemet 25/100 mg tablets, 1/2 tablet 3 times daily.  Currently she is on Sinemet 25/250 mg tablet taking 1 tablet in the morning.  She returns for an evaluation.  Past Medical History:  Diagnosis Date  . Abnormality of gait 02/05/2015  . Anxiety   . Arthritis   . Asthma   . Cerebrovascular disease    CVA '03  . Depression   . Dyslipidemia   . Endometriosis   . GERD (gastroesophageal reflux disease)   . Glaucoma   . Headache(784.0)   . Hypertension   . Macular degeneration    bilateral  . Obesity   . Paralysis agitans ([place]) 08/31/2013  . Parkinson disease ([place])   . TIA (transient ischemic attack)    Multiple    Past Surgical History:  Procedure Laterality Date  . ABDOMINAL HYSTERECTOMY    . CATARACT EXTRACTION,  BILATERAL    . GALLBLADDER SURGERY    . LOOP RECORDER IMPLANT  05-28-14   MDT LINQ implanted by Dr Caryl Comes for cryptogenic stroke/palpitations  . LOOP RECORDER IMPLANT N/A 05/28/2014   Procedure: LOOP RECORDER IMPLANT;  Surgeon: Deboraha Sprang, MD;  Location: [facility] CATH LAB;  Service: Cardiovascular;  Laterality: N/A;  . PILONIDAL CYST RESECTION    . TILT TABLE STUDY  05-28-14   orthostatic hypertension  . TILT TABLE STUDY N/A 05/28/2014   Procedure: TILT TABLE STUDY;  Surgeon: Deboraha Sprang, MD;  Location: [facility] CATH LAB;  Service: Cardiovascular;  Laterality: N/A;  . TONSILLECTOMY      Family History  Problem Relation Age of Onset  . Heart attack Mother   . Arthritis Brother   . Skin cancer Brother     Social history:  reports that  has never smoked. she has never used smokeless tobacco. She reports that she does not drink alcohol or use drugs.  Medications:  Prior to Admission medications   Medication Sig Start Date End Date Taking? Authorizing Provider  albuterol (VENTOLIN HFA) 108 (90 Base) MCG/ACT inhaler Inhale 2 puffs into the lungs every 6 (six) hours as needed for wheezing or shortness of breath.   Yes [provider]  ALPRAZolam Duanne Moron) 0.5 MG tablet Take 0.5 mg by mouth  3 (three) times daily.    Yes [provider]  alum & mag hydroxide-simeth (MINTOX) 200-200-20 MG/5ML suspension Take 30 mLs by mouth 4 (four) times daily as needed for indigestion or heartburn.   Yes [provider]  atorvastatin (LIPITOR) 10 MG tablet Take 10 mg by mouth at bedtime.    Yes [provider]  benzonatate (TESSALON) 100 MG capsule Take 100 mg by mouth 3 (three) times daily as needed for cough.   Yes [provider]  carbidopa-levodopa (SINEMET IR) 25-250 MG tablet Take 1 tablet by mouth daily.    Yes [provider]  Cholecalciferol (VITAMIN D3) 5000 units CAPS Take 5,000 Units by mouth daily at 12 noon.    Yes [provider]  clopidogrel  (PLAVIX) 75 MG tablet Take 75 mg by mouth daily at 12 noon.    Yes [provider]  Corn Starch POWD Apply 1 application topically 2 (two) times daily as needed (between skin folds).   Yes [provider]  dicyclomine (BENTYL) 20 MG tablet Take 20 mg by mouth every 4 (four) hours as needed for spasms.   Yes [provider]  diltiazem (CARDIZEM CD) 120 MG 24 hr capsule Take 240 mg by mouth daily.   Yes [provider]  DULoxetine (CYMBALTA) 30 MG capsule Take 30 mg by mouth daily.   Yes [provider]  glucose blood test strip 1 each by Other route as needed for other ([place]). Use as instructed   Yes [provider]  guaiFENesin (MUCINEX) 600 MG 12 hr tablet Take 600 mg by mouth 2 (two) times daily as needed for cough (and/or congestion).   Yes [provider]  guaiFENesin-codeine (ROBITUSSIN AC) 100-10 MG/5ML syrup Take 10 mLs by mouth every 4 (four) hours as needed for cough.   Yes [provider]  guaiFENesin-dextromethorphan (ROBITUSSIN DM) 100-10 MG/5ML syrup Take 10 mLs by mouth every 4 (four) hours as needed for cough.   Yes [provider]  loperamide (IMODIUM) 2 MG capsule Take 2 mg by mouth as needed for diarrhea or loose stools.   Yes [provider]  loratadine (CLARITIN) 10 MG tablet Take 10 mg by mouth daily as needed for allergies.   Yes [provider]  losartan (COZAAR) 25 MG tablet Take 25 mg by mouth at bedtime.    Yes [provider]  Melatonin 3 MG CAPS Take 3 mg by mouth at bedtime.   Yes [provider]  metFORMIN (GLUCOPHAGE) 500 MG tablet Take 500 mg by mouth 2 (two) times daily before a meal.    Yes [provider]  montelukast (SINGULAIR) 10 MG tablet Take 10 mg by mouth daily at 12 noon.    Yes [provider]  Multiple Vitamin (TAB-A-VITE) TABS Take 1 tablet by mouth daily at 12 noon.    Yes  [provider]  oxyCODONE (OXY IR/ROXICODONE) 5 MG immediate release tablet Take 2.5 mg by mouth 2 (two) times daily.    Yes [provider]  oxymetazoline (AFRIN) 0.05 % nasal spray Place 1 spray into both nostrils as needed (for nose bleeds).   Yes [provider]  pantoprazole (PROTONIX) 40 MG tablet Take 40 mg by mouth daily before breakfast.    Yes [provider]  polyethylene glycol (MIRALAX / GLYCOLAX) packet Take 17 g by mouth daily as needed for mild constipation or moderate constipation.    Yes [provider]  promethazine (PHENERGAN) 25 MG tablet Take 25 mg by mouth every 6 (six) hours as needed for nausea or vomiting.   Yes [provider]  sodium chloride (OCEAN) 0.65 % SOLN nasal spray Place 1 spray into both nostrils 4 (four) times daily as needed for congestion.   Yes [provider]  Travoprost, BAK Free, (TRAVATAN) 0.004 % SOLN ophthalmic solution Place 1 drop into both eyes at bedtime.   Yes [provider]  acetaminophen (TYLENOL) 325 MG tablet Take 325 mg by mouth every 4 (four) hours as needed for mild pain or fever.     [provider]  simethicone (MYLICON) 80 MG chewable tablet Chew 80 mg by mouth 4 (four) times daily as needed for flatulence.    [provider]  talc (ZEASORB) powder Apply 1 application topically 2 (two) times daily as needed (for irritation).    [provider]      Allergies  Allergen Reactions  . Comtan [Entacapone] Other (See Comments)    Reaction:  Unknown   . Mirapex [Pramipexole Dihydrochloride] Other (See Comments)    Reaction:  Unknown   . Sulfa Antibiotics Nausea And Vomiting  . Ciprofloxacin Other (See Comments)    Reaction:  Made pt feel crazy     ROS:  Out of a complete 14 system review of symptoms, the patient complains only of the following symptoms, and all other reviewed systems are negative.  Walking  problem Depression  Blood pressure (!) 142/80, pulse 99, SpO2 93 %.  Physical Exam  General: The patient is alert and cooperative at the time of the examination.  The patient is markedly obese.  Eyes: Pupils are equal, round, and reactive to light. Discs are flat bilaterally.  Neck: The neck is supple, no carotid bruits are noted.  Respiratory: The respiratory examination is clear.  Cardiovascular: The cardiovascular examination reveals a regular rate and rhythm, no obvious murmurs or rubs are noted.  Skin: Extremities are with 1+ edema below the knees bilaterally.  Neurologic Exam  Mental status: The patient is alert and oriented x 3 at the time of the examination. The Mini-Mental status examination done today shows a total score 28/30.  Cranial nerves: Facial symmetry is present. There is good sensation of the face to pinprick and soft touch bilaterally. The strength of the facial muscles and the muscles to head turning and shoulder shrug are normal bilaterally. Speech is well enunciated, no aphasia or dysarthria is noted. Extraocular movements are full. Visual fields are full. The tongue is midline, and the patient has symmetric elevation of the soft palate. No obvious hearing deficits are noted.  Motor: The motor testing reveals 5 over 5 strength of all 4 extremities, but the patient is not able to elevate the right arm. Good symmetric motor tone is noted throughout.  Sensory: Sensory testing is intact to pinprick, soft touch and vibration sensation on all 4 extremities. No evidence of extinction is noted.  Coordination: Cerebellar testing reveals good finger-nose-finger and heel-to-shin bilaterally, but the patient is unable to use the right arm well.  Gait and station: The patient can stand with assistance, she is able to stand briefly with her eyes closed.  She is unable to effectively ambulate, she is wheelchair-bound.  Reflexes: Deep tendon reflexes are symmetric, but are  depressed bilaterally.   Assessment/Plan:  1.  Parkinson's disease  2.  Gait disturbance  3.  Memory disturbance  The patient is having significant issues with balance, she has a  tendency to go backwards.  She is a high fall risk.  She is currently restricted to a wheelchair which is appropriate.  She is only getting 1 Sinemet tablet in the morning, I will switch the dosing to taking the 25/100 mg tablets 3 times daily.  The patient will follow-up in 6 months.  The patient is contemplating moving in with her daughter which may improve her safety issues as she will have closer monitoring.  Keeping her in a wheelchair is reasonable at this point.  Jill Alexanders MD 11/24/2017 9:52 AM  [facility] [address] [place], [place] [address]  Phone [phone] Fax [phone]

## 2017-11-24 NOTE — Patient Instructions (Addendum)
   We will change the sinemet (carbidopa) to 25/100 mg one tablet three times a day.   Sinemet (carbidopa) may result in confusion or hallucinations, drowsiness, nausea, or dizziness. If any significant side effects are noted, please contact our office. Sinemet may not be well absorbed when taken with high protein meals, if tolerated it is best to take 30-45 minutes before you eat.

## 2018-03-07 ENCOUNTER — Non-Acute Institutional Stay: Payer: Medicare Other | Admitting: Hospice and Palliative Medicine

## 2018-03-07 DIAGNOSIS — Z515 Encounter for palliative care: Secondary | ICD-10-CM

## 2018-03-07 NOTE — Progress Notes (Signed)
PALLIATIVE CARE CONSULT VISIT   PATIENT NAME: [name] DOB: Mar 07, 1930 MRN: [id number]  PRIMARY CARE PROVIDER: Jacklyn Shell, FNP  REFERRING PROVIDER: Jacklyn Shell, [place], [place] [address]  RESPONSIBLE PARTY:   self   RECOMMENDATIONS and PLAN:  1. 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 She has ongoing, unrelieved pain in this arm. She has a lidoderm patch in place. She is on Cymbalta and gets oxycodone 5 mg PO BID prn. She is really uncomfortable today. Requested a dose of oxycodone be given now. Perhaps ortho or pain management appointment? She could possibly benefit from a steroid injection? Possibly Neurontin? She complains of the "nerve pain." Perhaps schedule Tylenol as well. She may benefit from a brace to stabilize this arm, also appropriate for ortho to eval.  2. Depression/grief: continues on Cymbalta. Continues in need of bereavement counseling. Her husband died this past 11-23-23.  3. ACP: DNR form on chart. Discussion of MOST form on next visit.   I spent 25 minutes providing this consultation,  from 12:15 pm to 12:40pm. More than 50% of the time in this consultation was spent coordinating communication.   HISTORY OF PRESENT ILLNESS:  [name] is a 84 y.o.  female with multiple medical problems to include but not limited to hx of CVA, Parkinson's, fairly recent fall with a non union fractured humerus who resides in [place].  Palliative Care was asked to help address symptom management and goals of care.   CODE STATUS: DNR  PPS: 30% HOSPICE ELIGIBILITY/DIAGNOSIS: TBD  PAST MEDICAL HISTORY:  Past Medical History:  Diagnosis Date  . Abnormality of gait 02/05/2015  . Anxiety   . Arthritis   . Asthma   . Cerebrovascular disease    CVA '03  . Depression   . Dyslipidemia   . Endometriosis   . GERD (gastroesophageal reflux disease)   . Glaucoma   .  Headache(784.0)   . Hypertension   . Macular degeneration    bilateral  . Obesity   . Paralysis agitans ([place]) 08/31/2013  . Parkinson disease ([place])   . TIA (transient ischemic attack)    Multiple    SOCIAL HX:  Social History   Tobacco Use  . Smoking status: Never Smoker  . Smokeless tobacco: Never Used  Substance Use Topics  . Alcohol use: No    ALLERGIES:  Allergies  Allergen Reactions  . Comtan [Entacapone] Other (See Comments)    Reaction:  Unknown   . Mirapex [Pramipexole Dihydrochloride] Other (See Comments)    Reaction:  Unknown   . Sulfa Antibiotics Nausea And Vomiting  . Ciprofloxacin Other (See Comments)    Reaction:  Made pt feel crazy      PERTINENT MEDICATIONS:  Outpatient Encounter Medications as of 03/07/2018  Medication Sig  . acetaminophen (TYLENOL) 325 MG tablet Take 325 mg by mouth every 4 (four) hours as needed for mild pain or fever.   Marland Kitchen albuterol (VENTOLIN HFA) 108 (90 Base) MCG/ACT inhaler Inhale 2 puffs into the lungs every 6 (six) hours as needed for wheezing or shortness of breath.  . ALPRAZolam (XANAX) 0.5 MG tablet Take 0.5 mg by mouth 3 (three) times daily.   Marland Kitchen alum & mag hydroxide-simeth (MINTOX) 324-401-02 MG/5ML suspension Take 30 mLs by mouth 4 (four) times daily as needed for indigestion or heartburn.  Marland Kitchen atorvastatin (LIPITOR) 10 MG tablet Take 10 mg by mouth at bedtime.   Marland Kitchen  benzonatate (TESSALON) 100 MG capsule Take 100 mg by mouth 3 (three) times daily as needed for cough.  . Carbidopa-Levodopa ER (SINEMET CR) 25-100 MG tablet controlled release Take 1 tablet by mouth 3 (three) times daily.  . Cholecalciferol (VITAMIN D3) 5000 units CAPS Take 5,000 Units by mouth daily at 12 noon.   . clopidogrel (PLAVIX) 75 MG tablet Take 75 mg by mouth daily at 12 noon.   . Corn Starch POWD Apply 1 application topically 2 (two) times daily as needed (between skin folds).  . dicyclomine (BENTYL) 20 MG tablet Take 20 mg by mouth every 4 (four) hours as  needed for spasms.  Marland Kitchen diltiazem (CARDIZEM CD) 120 MG 24 hr capsule Take 240 mg by mouth daily.  . DULoxetine (CYMBALTA) 30 MG capsule Take 30 mg by mouth daily.  Marland Kitchen glucose blood test strip 1 each by Other route as needed for other ([place]). Use as instructed  . guaiFENesin (MUCINEX) 600 MG 12 hr tablet Take 600 mg by mouth 2 (two) times daily as needed for cough (and/or congestion).  Marland Kitchen guaiFENesin-codeine (ROBITUSSIN AC) 100-10 MG/5ML syrup Take 10 mLs by mouth every 4 (four) hours as needed for cough.  Marland Kitchen guaiFENesin-dextromethorphan (ROBITUSSIN DM) 100-10 MG/5ML syrup Take 10 mLs by mouth every 4 (four) hours as needed for cough.  . loperamide (IMODIUM) 2 MG capsule Take 2 mg by mouth as needed for diarrhea or loose stools.  Marland Kitchen loratadine (CLARITIN) 10 MG tablet Take 10 mg by mouth daily as needed for allergies.  Marland Kitchen losartan (COZAAR) 25 MG tablet Take 25 mg by mouth at bedtime.   . Melatonin 3 MG CAPS Take 3 mg by mouth at bedtime.  . metFORMIN (GLUCOPHAGE) 500 MG tablet Take 500 mg by mouth 2 (two) times daily before a meal.   . montelukast (SINGULAIR) 10 MG tablet Take 10 mg by mouth daily at 12 noon.   . Multiple Vitamin (TAB-A-VITE) TABS Take 1 tablet by mouth daily at 12 noon.   Marland Kitchen oxyCODONE (OXY IR/ROXICODONE) 5 MG immediate release tablet Take 2.5 mg by mouth 2 (two) times daily.   Marland Kitchen oxymetazoline (AFRIN) 0.05 % nasal spray Place 1 spray into both nostrils as needed (for nose bleeds).  . pantoprazole (PROTONIX) 40 MG tablet Take 40 mg by mouth daily before breakfast.   . polyethylene glycol (MIRALAX / GLYCOLAX) packet Take 17 g by mouth daily as needed for mild constipation or moderate constipation.   . promethazine (PHENERGAN) 25 MG tablet Take 25 mg by mouth every 6 (six) hours as needed for nausea or vomiting.  . simethicone (MYLICON) 80 MG chewable tablet Chew 80 mg by mouth 4 (four) times daily as needed for flatulence.  . sodium chloride  (OCEAN) 0.65 % SOLN nasal spray Place 1 spray into both nostrils 4 (four) times daily as needed for congestion.  Marland Kitchen talc (ZEASORB) powder Apply 1 application topically 2 (two) times daily as needed (for irritation).  . Travoprost, BAK Free, (TRAVATAN) 0.004 % SOLN ophthalmic solution Place 1 drop into both eyes at bedtime.   No facility-administered encounter medications on file as of 03/07/2018.     PHYSICAL EXAM:   General: Caucasian female in [place] grimacing Cardiovascular: + murmur; irreg rhythm reg rate Pulmonary: CTAB Abdomen: soft, active BS, NTTP Extremities: right shoulder/arm pain/weakness; bilat leg weakness Skin: thin, easily bruised Neurological: alert and oriented x 3. +tremors; +generalized weakness  Nathanial Rancher, NP

## 2018-04-07 ENCOUNTER — Non-Acute Institutional Stay: Payer: Medicare Other | Admitting: Hospice and Palliative Medicine

## 2018-04-07 DIAGNOSIS — Z515 Encounter for palliative care: Secondary | ICD-10-CM

## 2018-04-07 DIAGNOSIS — F4321 Adjustment disorder with depressed mood: Secondary | ICD-10-CM

## 2018-04-09 NOTE — Progress Notes (Signed)
PALLIATIVE CARE CONSULT VISIT   PATIENT NAME: [name] DOB: [birth year] MRN: [id number]  PRIMARY CARE PROVIDER: Jacklyn Shell, FNP  REFERRING PROVIDER: Jacklyn Shell, [place], [place] [address]  RESPONSIBLE PARTY:   Daughter   RECOMMENDATIONS and PLAN:  1. Grieving: patient is seen today appearing very sad and missing her husband. Whole demeanor is different. She is also concerned about financial issues and not being able to maintain the cost of assisted living. I listened and provided support. Suggested possible [place] with additional support services. Family meeting on Saturday to discuss her future plans. Will pass on to our SW to continue to support emotionally 2. Generalized weakness: she has completed PT/OT. She knows safe transferring technique. She is able to dress herself and transfer and take her self to the bathroom in her WC. She needs assistance with meals and generalized cleaning. Perhaps an IL setting is appropriate for her with medications pre filled in pill box. Needs safety measures in place at all times. 3. Advanced care planning: DNR form on chart. MOST form discussion not appropriate today.  I spent 25 minutes providing this consultation,  from 11:45  to 12:10. More than 50% of the time in this consultation was spent interviewing and assessing patient and coordinating communication.   HISTORY OF PRESENT ILLNESS:  [name] is a 84 y.o. female with multiple medical problems including who resides in [place]. Palliative Care was asked to help address symptom management and goals of care.   CODE STATUS: DNR  PPS: weak 40% HOSPICE ELIGIBILITY/DIAGNOSIS: TBD  PAST MEDICAL HISTORY:  Past Medical History:  Diagnosis Date  . Abnormality of gait 02/05/2015  . Anxiety   . Arthritis   . Asthma   . Cerebrovascular disease    CVA '03  . Depression   . Dyslipidemia   . Endometriosis   . GERD (gastroesophageal reflux disease)    . Glaucoma   . Headache(784.0)   . Hypertension   . Macular degeneration    bilateral  . Obesity   . Paralysis agitans ([place]) 08/31/2013  . Parkinson disease ([place])   . TIA (transient ischemic attack)    Multiple    SOCIAL HX:  Social History   Tobacco Use  . Smoking status: Never Smoker  . Smokeless tobacco: Never Used  Substance Use Topics  . Alcohol use: No    ALLERGIES:  Allergies  Allergen Reactions  . Comtan [Entacapone] Other (See Comments)    Reaction:  Unknown   . Mirapex [Pramipexole Dihydrochloride] Other (See Comments)    Reaction:  Unknown   . Sulfa Antibiotics Nausea And Vomiting  . Ciprofloxacin Other (See Comments)    Reaction:  Made pt feel crazy      PERTINENT MEDICATIONS:  Outpatient Encounter Medications as of 04/07/2018  Medication Sig  . acetaminophen (TYLENOL) 325 MG tablet Take 325 mg by mouth every 4 (four) hours as needed for mild pain or fever.   Marland Kitchen albuterol (VENTOLIN HFA) 108 (90 Base) MCG/ACT inhaler Inhale 2 puffs into the lungs every 6 (six) hours as needed for wheezing or shortness of breath.  . ALPRAZolam (XANAX) 0.5 MG tablet Take 0.5 mg by mouth 3 (three) times daily.   Marland Kitchen alum & mag hydroxide-simeth (MINTOX) 301-601-09 MG/5ML suspension Take 30 mLs by mouth 4 (four) times daily as needed for indigestion or heartburn.  Marland Kitchen atorvastatin (LIPITOR) 10 MG tablet Take 10 mg by mouth at bedtime.   . benzonatate (TESSALON)  100 MG capsule Take 100 mg by mouth 3 (three) times daily as needed for cough.  . Carbidopa-Levodopa ER (SINEMET CR) 25-100 MG tablet controlled release Take 1 tablet by mouth 3 (three) times daily.  . Cholecalciferol (VITAMIN D3) 5000 units CAPS Take 5,000 Units by mouth daily at 12 noon.   . clopidogrel (PLAVIX) 75 MG tablet Take 75 mg by mouth daily at 12 noon.   . Corn Starch POWD Apply 1 application topically 2 (two) times daily as needed (between skin folds).  . dicyclomine (BENTYL) 20 MG tablet Take 20 mg by mouth every 4  (four) hours as needed for spasms.  Marland Kitchen diltiazem (CARDIZEM CD) 120 MG 24 hr capsule Take 240 mg by mouth daily.  . DULoxetine (CYMBALTA) 30 MG capsule Take 30 mg by mouth daily.  Marland Kitchen glucose blood test strip 1 each by Other route as needed for other ([place]). Use as instructed  . guaiFENesin (MUCINEX) 600 MG 12 hr tablet Take 600 mg by mouth 2 (two) times daily as needed for cough (and/or congestion).  Marland Kitchen guaiFENesin-codeine (ROBITUSSIN AC) 100-10 MG/5ML syrup Take 10 mLs by mouth every 4 (four) hours as needed for cough.  Marland Kitchen guaiFENesin-dextromethorphan (ROBITUSSIN DM) 100-10 MG/5ML syrup Take 10 mLs by mouth every 4 (four) hours as needed for cough.  . loperamide (IMODIUM) 2 MG capsule Take 2 mg by mouth as needed for diarrhea or loose stools.  Marland Kitchen loratadine (CLARITIN) 10 MG tablet Take 10 mg by mouth daily as needed for allergies.  Marland Kitchen losartan (COZAAR) 25 MG tablet Take 25 mg by mouth at bedtime.   . Melatonin 3 MG CAPS Take 3 mg by mouth at bedtime.  . metFORMIN (GLUCOPHAGE) 500 MG tablet Take 500 mg by mouth 2 (two) times daily before a meal.   . montelukast (SINGULAIR) 10 MG tablet Take 10 mg by mouth daily at 12 noon.   . Multiple Vitamin (TAB-A-VITE) TABS Take 1 tablet by mouth daily at 12 noon.   Marland Kitchen oxyCODONE (OXY IR/ROXICODONE) 5 MG immediate release tablet Take 2.5 mg by mouth 2 (two) times daily.   Marland Kitchen oxymetazoline (AFRIN) 0.05 % nasal spray Place 1 spray into both nostrils as needed (for nose bleeds).  . pantoprazole (PROTONIX) 40 MG tablet Take 40 mg by mouth daily before breakfast.   . polyethylene glycol (MIRALAX / GLYCOLAX) packet Take 17 g by mouth daily as needed for mild constipation or moderate constipation.   . promethazine (PHENERGAN) 25 MG tablet Take 25 mg by mouth every 6 (six) hours as needed for nausea or vomiting.  . simethicone (MYLICON) 80 MG chewable tablet Chew 80 mg by mouth 4 (four) times daily as needed for flatulence.  . sodium  chloride (OCEAN) 0.65 % SOLN nasal spray Place 1 spray into both nostrils 4 (four) times daily as needed for congestion.  Marland Kitchen talc (ZEASORB) powder Apply 1 application topically 2 (two) times daily as needed (for irritation).  . Travoprost, BAK Free, (TRAVATAN) 0.004 % SOLN ophthalmic solution Place 1 drop into both eyes at bedtime.   No facility-administered encounter medications on file as of 04/07/2018.     PHYSICAL EXAM:   General: Caucasian female appears sad today sitting in her recliner Cardiovascular: RRR Pulmonary: CTAB Abdomen: soft, active BS, NTTP Extremities: using all 4. Still trouble with shoulder; weak upon standing but able to transfer independently but slowly; at risk for falling Skin: Thin and fragile Neurological: alert, oriented x 3  Gordy Clement  [place]boy, NP

## 2018-04-12 ENCOUNTER — Non-Acute Institutional Stay: Payer: Medicare Other | Admitting: Hospice and Palliative Medicine

## 2018-04-12 DIAGNOSIS — R52 Pain, unspecified: Secondary | ICD-10-CM

## 2018-04-12 DIAGNOSIS — Z515 Encounter for palliative care: Secondary | ICD-10-CM

## 2018-04-13 NOTE — Progress Notes (Signed)
PALLIATIVE CARE CONSULT VISIT   PATIENT NAME: [name] DOB: [birth year] MRN: [id number]  PRIMARY CARE PROVIDER: Jacklyn Shell, FNP  REFERRING PROVIDER: Jacklyn Shell, [place], [place] [address]  RESPONSIBLE PARTY:   Daughter   RECOMMENDATIONS and PLAN:  1. Pain: requested pain medication from staff. She has oxycodone 2.5 mg scheduled. Continue lidoderm patch and schedule Tylenol QID. 2. ACP: DNR status. [place] setting [place] her med box and assistance with baths per her family. Will follow her to that setting.   I spent 15 minutes providing this consultation,  from 12:30pm to 12:45pm. More than 50% of the time in this consultation was spent assessing patient and coordinating communication.   HISTORY OF PRESENT ILLNESS:  [name] is a 84 y.o. female with multiple medical problems who resides in [facility] [place] setting. Palliative Care was asked to help address symptom management and goals of care by the primary NP of the building. Today's visit acute due to her appearance as I was walking by.  CODE STATUS: DNR  PPS: weak 40%/ can transfer independently but WC bound HOSPICE ELIGIBILITY/DIAGNOSIS: TBD  PAST MEDICAL HISTORY:  Past Medical History:  Diagnosis Date  . Abnormality of gait 02/05/2015  . Anxiety   . Arthritis   . Asthma   . Cerebrovascular disease    CVA '03  . Depression   . Dyslipidemia   . Endometriosis   . GERD (gastroesophageal reflux disease)   . Glaucoma   . Headache(784.0)   . Hypertension   . Macular degeneration    bilateral  . Obesity   . Paralysis agitans ([place]) 08/31/2013  . Parkinson disease ([place])   . TIA (transient ischemic attack)    Multiple    SOCIAL HX:  Social History   Tobacco Use  . Smoking status: Never Smoker  . Smokeless tobacco: Never Used  Substance Use Topics  . Alcohol use: No    ALLERGIES:  Allergies  Allergen Reactions  . Comtan  [Entacapone] Other (See Comments)    Reaction:  Unknown   . Mirapex [Pramipexole Dihydrochloride] Other (See Comments)    Reaction:  Unknown   . Sulfa Antibiotics Nausea And Vomiting  . Ciprofloxacin Other (See Comments)    Reaction:  Made pt feel crazy      PERTINENT MEDICATIONS:  Outpatient Encounter Medications as of 04/12/2018  Medication Sig  . acetaminophen (TYLENOL) 325 MG tablet Take 325 mg by mouth every 4 (four) hours as needed for mild pain or fever.   Marland Kitchen albuterol (VENTOLIN HFA) 108 (90 Base) MCG/ACT inhaler Inhale 2 puffs into the lungs every 6 (six) hours as needed for wheezing or shortness of breath.  . ALPRAZolam (XANAX) 0.5 MG tablet Take 0.5 mg by mouth 3 (three) times daily.   Marland Kitchen alum & mag hydroxide-simeth (MINTOX) 937-169-67 MG/5ML suspension Take 30 mLs by mouth 4 (four) times daily as needed for indigestion or heartburn.  Marland Kitchen atorvastatin (LIPITOR) 10 MG tablet Take 10 mg by mouth at bedtime.   . benzonatate (TESSALON) 100 MG capsule Take 100 mg by mouth 3 (three) times daily as needed for cough.  . Carbidopa-Levodopa ER (SINEMET CR) 25-100 MG tablet controlled release Take 1 tablet by mouth 3 (three) times daily.  . Cholecalciferol (VITAMIN D3) 5000 units CAPS Take 5,000 Units by mouth daily at 12 noon.   . clopidogrel (PLAVIX) 75 MG tablet Take 75 mg by mouth daily at 12 noon.   Marland Kitchen  Corn Starch POWD Apply 1 application topically 2 (two) times daily as needed (between skin folds).  . dicyclomine (BENTYL) 20 MG tablet Take 20 mg by mouth every 4 (four) hours as needed for spasms.  Marland Kitchen diltiazem (CARDIZEM CD) 120 MG 24 hr capsule Take 240 mg by mouth daily.  . DULoxetine (CYMBALTA) 30 MG capsule Take 30 mg by mouth daily.  Marland Kitchen glucose blood test strip 1 each by Other route as needed for other ([place]). Use as instructed  . guaiFENesin (MUCINEX) 600 MG 12 hr tablet Take 600 mg by mouth 2 (two) times daily as needed for cough (and/or  congestion).  Marland Kitchen guaiFENesin-codeine (ROBITUSSIN AC) 100-10 MG/5ML syrup Take 10 mLs by mouth every 4 (four) hours as needed for cough.  Marland Kitchen guaiFENesin-dextromethorphan (ROBITUSSIN DM) 100-10 MG/5ML syrup Take 10 mLs by mouth every 4 (four) hours as needed for cough.  . loperamide (IMODIUM) 2 MG capsule Take 2 mg by mouth as needed for diarrhea or loose stools.  Marland Kitchen loratadine (CLARITIN) 10 MG tablet Take 10 mg by mouth daily as needed for allergies.  Marland Kitchen losartan (COZAAR) 25 MG tablet Take 25 mg by mouth at bedtime.   . Melatonin 3 MG CAPS Take 3 mg by mouth at bedtime.  . metFORMIN (GLUCOPHAGE) 500 MG tablet Take 500 mg by mouth 2 (two) times daily before a meal.   . montelukast (SINGULAIR) 10 MG tablet Take 10 mg by mouth daily at 12 noon.   . Multiple Vitamin (TAB-A-VITE) TABS Take 1 tablet by mouth daily at 12 noon.   Marland Kitchen oxyCODONE (OXY IR/ROXICODONE) 5 MG immediate release tablet Take 2.5 mg by mouth 2 (two) times daily.   Marland Kitchen oxymetazoline (AFRIN) 0.05 % nasal spray Place 1 spray into both nostrils as needed (for nose bleeds).  . pantoprazole (PROTONIX) 40 MG tablet Take 40 mg by mouth daily before breakfast.   . polyethylene glycol (MIRALAX / GLYCOLAX) packet Take 17 g by mouth daily as needed for mild constipation or moderate constipation.   . promethazine (PHENERGAN) 25 MG tablet Take 25 mg by mouth every 6 (six) hours as needed for nausea or vomiting.  . simethicone (MYLICON) 80 MG chewable tablet Chew 80 mg by mouth 4 (four) times daily as needed for flatulence.  . sodium chloride (OCEAN) 0.65 % SOLN nasal spray Place 1 spray into both nostrils 4 (four) times daily as needed for congestion.  Marland Kitchen talc (ZEASORB) powder Apply 1 application topically 2 (two) times daily as needed (for irritation).  . Travoprost, BAK Free, (TRAVATAN) 0.004 % SOLN ophthalmic solution Place 1 drop into both eyes at bedtime.   No facility-administered encounter medications on file as of 04/12/2018.     PHYSICAL EXAM:     General: Alert, Caucasian female sitting grimacing in pain Cardiovascular: Irreg rhythm; reg rate Pulmonary: CTAB Abdomen: obese; soft, active BS  Extremities: weak on standing: right shoulder pain with movement Skin: fragile Neurological: alert, oriented x 3; has conversation; +generalized weakness   Nathanial Rancher, NP

## 2018-04-17 ENCOUNTER — Encounter (HOSPITAL_COMMUNITY): Payer: Self-pay | Admitting: Emergency Medicine

## 2018-04-17 ENCOUNTER — Emergency Department (HOSPITAL_COMMUNITY)
Admission: EM | Admit: 2018-04-17 | Discharge: 2018-04-17 | Disposition: A | Discharge status: Home / Self Care | Payer: Medicare Other | Attending: Emergency Medicine | Admitting: Emergency Medicine

## 2018-04-17 DIAGNOSIS — Z7902 Long term (current) use of antithrombotics/antiplatelets: Secondary | ICD-10-CM | POA: Diagnosis not present

## 2018-04-17 DIAGNOSIS — J45909 Unspecified asthma, uncomplicated: Secondary | ICD-10-CM | POA: Insufficient documentation

## 2018-04-17 DIAGNOSIS — Z7984 Long term (current) use of oral hypoglycemic drugs: Secondary | ICD-10-CM | POA: Diagnosis not present

## 2018-04-17 DIAGNOSIS — R0989 Other specified symptoms and signs involving the circulatory and respiratory systems: Secondary | ICD-10-CM | POA: Diagnosis not present

## 2018-04-17 DIAGNOSIS — I10 Essential (primary) hypertension: Secondary | ICD-10-CM | POA: Diagnosis not present

## 2018-04-17 DIAGNOSIS — G20 Parkinson's disease: Secondary | ICD-10-CM | POA: Diagnosis not present

## 2018-04-17 DIAGNOSIS — Z8673 Personal history of transient ischemic attack (TIA), and cerebral infarction without residual deficits: Secondary | ICD-10-CM | POA: Insufficient documentation

## 2018-04-17 DIAGNOSIS — Z79899 Other long term (current) drug therapy: Secondary | ICD-10-CM | POA: Insufficient documentation

## 2018-04-17 MED ORDER — LIDOCAINE VISCOUS HCL 2 % MT SOLN
15.0000 mL | Freq: Once | OROMUCOSAL | Status: AC
  Administered 2018-04-17: 15 mL via OROMUCOSAL
  Filled 2018-04-17: qty 15

## 2018-04-17 NOTE — Discharge Instructions (Signed)
Return for choking or inability to eat or drink

## 2018-04-17 NOTE — ED Triage Notes (Signed)
Pt reports that when it rains little worms will come in from the courtyard under the door and feels that one worm is in her throat crawling around. States, "it too slippery I cant get a hold of it". Denies pain

## 2018-04-17 NOTE — ED Provider Notes (Signed)
[facility] DEPT Provider Note   CSN: [id number] Arrival date & time: 04/17/18  2409     History   Chief Complaint Chief Complaint  Patient presents with  . worm stuck in throat    HPI [name] is a 84 y.o. female.  84 yo F with a cc of a sensation that something is stuck in the back of her throat.  She thinks its moving.  She lives at a place where there are a lot of worms on the ground after it rains and so she thinks that one crawled into her throat.  She can grab something in her mouth but is unable to pull it out.  Able to eat and drink without difficulty.   The history is provided by the patient.  Illness  This is a new problem. The current episode started 12 to 24 hours ago. The problem occurs constantly. The problem has not changed since onset.Pertinent negatives include no chest pain, no headaches and no shortness of breath. Nothing aggravates the symptoms. Nothing relieves the symptoms. She has tried nothing for the symptoms. The treatment provided no relief.    Past Medical History:  Diagnosis Date  . Abnormality of gait 02/05/2015  . Anxiety   . Arthritis   . Asthma   . Cerebrovascular disease    CVA '03  . Depression   . Dyslipidemia   . Endometriosis   . GERD (gastroesophageal reflux disease)   . Glaucoma   . Headache(784.0)   . Hypertension   . Macular degeneration    bilateral  . Obesity   . Paralysis agitans ([place]) 08/31/2013  . Parkinson disease ([place])   . TIA (transient ischemic attack)    Multiple    Patient Active Problem List   Diagnosis Date Noted  . Tachycardia 08/12/2015  . Abnormality of gait 02/05/2015  . Palpitations 05/28/2014  . TIA (transient ischemic attack) 05/25/2014  . Dyslipidemia 05/25/2014  . Parkinson disease ([place]) 05/25/2014  . H/O: CVA (cerebrovascular accident) 05/25/2014  . Paralysis agitans ([place]) 08/31/2013  . Dizziness and giddiness 08/31/2013    Past Surgical History:    Procedure Laterality Date  . ABDOMINAL HYSTERECTOMY    . CATARACT EXTRACTION, BILATERAL    . GALLBLADDER SURGERY    . LOOP RECORDER IMPLANT  05-28-14   MDT LINQ implanted by Dr Caryl Comes for cryptogenic stroke/palpitations  . LOOP RECORDER IMPLANT N/A 05/28/2014   Procedure: LOOP RECORDER IMPLANT;  Surgeon: Deboraha Sprang, MD;  Location: [facility] CATH LAB;  Service: Cardiovascular;  Laterality: N/A;  . PILONIDAL CYST RESECTION    . TILT TABLE STUDY  05-28-14   orthostatic hypertension  . TILT TABLE STUDY N/A 05/28/2014   Procedure: TILT TABLE STUDY;  Surgeon: Deboraha Sprang, MD;  Location: [facility] CATH LAB;  Service: Cardiovascular;  Laterality: N/A;  . TONSILLECTOMY       OB History   None      Home Medications    Prior to Admission medications   Medication Sig Start Date End Date Taking? Authorizing Provider  acetaminophen (TYLENOL) 325 MG tablet Take 325 mg by mouth every 4 (four) hours as needed for mild pain or fever.     [provider]  albuterol (VENTOLIN HFA) 108 (90 Base) MCG/ACT inhaler Inhale 2 puffs into the lungs every 6 (six) hours as needed for wheezing or shortness of breath.    [provider]  ALPRAZolam Duanne Moron) 0.5 MG tablet Take 0.5 mg by mouth 3 (  three) times daily.     [provider]  alum & mag hydroxide-simeth (MINTOX) 200-200-20 MG/5ML suspension Take 30 mLs by mouth 4 (four) times daily as needed for indigestion or heartburn.    [provider]  atorvastatin (LIPITOR) 10 MG tablet Take 10 mg by mouth at bedtime.     [provider]  benzonatate (TESSALON) 100 MG capsule Take 100 mg by mouth 3 (three) times daily as needed for cough.    [provider]  Carbidopa-Levodopa ER (SINEMET CR) 25-100 MG tablet controlled release Take 1 tablet by mouth 3 (three) times daily. 11/24/17   Kathrynn Ducking, MD  Cholecalciferol (VITAMIN D3) 5000 units CAPS Take 5,000 Units by mouth daily at 12 noon.     [provider]   clopidogrel (PLAVIX) 75 MG tablet Take 75 mg by mouth daily at 12 noon.     [provider]  Corn Starch POWD Apply 1 application topically 2 (two) times daily as needed (between skin folds).    [provider]  dicyclomine (BENTYL) 20 MG tablet Take 20 mg by mouth every 4 (four) hours as needed for spasms.    [provider]  diltiazem (CARDIZEM CD) 120 MG 24 hr capsule Take 240 mg by mouth daily.    [provider]  DULoxetine (CYMBALTA) 30 MG capsule Take 30 mg by mouth daily.    [provider]  glucose blood test strip 1 each by Other route as needed for other ([place]). Use as instructed    [provider]  guaiFENesin (MUCINEX) 600 MG 12 hr tablet Take 600 mg by mouth 2 (two) times daily as needed for cough (and/or congestion).    [provider]  guaiFENesin-codeine (ROBITUSSIN AC) 100-10 MG/5ML syrup Take 10 mLs by mouth every 4 (four) hours as needed for cough.    [provider]  guaiFENesin-dextromethorphan (ROBITUSSIN DM) 100-10 MG/5ML syrup Take 10 mLs by mouth every 4 (four) hours as needed for cough.    [provider]  loperamide (IMODIUM) 2 MG capsule Take 2 mg by mouth as needed for diarrhea or loose stools.    [provider]  loratadine (CLARITIN) 10 MG tablet Take 10 mg by mouth daily as needed for allergies.    [provider]  losartan (COZAAR) 25 MG tablet Take 25 mg by mouth at bedtime.     [provider]  Melatonin 3 MG CAPS Take 3 mg by mouth at bedtime.    [provider]  metFORMIN (GLUCOPHAGE) 500 MG tablet Take 500 mg by mouth 2 (two) times daily before a meal.     [provider]  montelukast (SINGULAIR) 10 MG tablet Take 10 mg by mouth daily at 12 noon.     [provider]  Multiple Vitamin (TAB-A-VITE) TABS Take 1 tablet by mouth daily at 12 noon.     [provider]  oxyCODONE  (OXY IR/ROXICODONE) 5 MG immediate release tablet Take 2.5 mg by mouth 2 (two) times daily.     [provider]  oxymetazoline (AFRIN) 0.05 % nasal spray Place 1 spray into both nostrils as needed (for nose bleeds).    [provider]  pantoprazole (PROTONIX) 40 MG tablet Take 40 mg by mouth daily before breakfast.     [provider]  polyethylene glycol (MIRALAX / GLYCOLAX) packet Take 17 g by mouth daily as needed for mild constipation or moderate constipation.  [provider]  promethazine (PHENERGAN) 25 MG tablet Take 25 mg by mouth every 6 (six) hours as needed for nausea or vomiting.    [provider]  simethicone (MYLICON) 80 MG chewable tablet Chew 80 mg by mouth 4 (four) times daily as needed for flatulence.    [provider]  sodium chloride (OCEAN) 0.65 % SOLN nasal spray Place 1 spray into both nostrils 4 (four) times daily as needed for congestion.    [provider]  talc (ZEASORB) powder Apply 1 application topically 2 (two) times daily as needed (for irritation).    [provider]  Travoprost, BAK Free, (TRAVATAN) 0.004 % SOLN ophthalmic solution Place 1 drop into both eyes at bedtime.    [provider]    Family History Family History  Problem Relation Age of Onset  . Heart attack Mother   . Arthritis Brother   . Skin cancer Brother     Social History Social History   Tobacco Use  . Smoking status: Never Smoker  . Smokeless tobacco: Never Used  Substance Use Topics  . Alcohol use: No  . Drug use: No     Allergies   Comtan [entacapone]; Mirapex [pramipexole dihydrochloride]; Sulfa antibiotics; and Ciprofloxacin   Review of Systems Review of Systems  Constitutional: Negative for chills and fever.  HENT: Positive for trouble swallowing. Negative for congestion and rhinorrhea.   Eyes: Negative for redness and visual disturbance.  Respiratory: Negative for shortness of breath  and wheezing.   Cardiovascular: Negative for chest pain and palpitations.  Gastrointestinal: Negative for nausea and vomiting.  Genitourinary: Negative for dysuria and urgency.  Musculoskeletal: Negative for arthralgias and myalgias.  Skin: Negative for pallor and wound.  Neurological: Negative for dizziness and headaches.     Physical Exam Updated Vital Signs BP (!) 155/94   Pulse 88   Temp 97.7 F (36.5 C) (Oral)   Resp 16   SpO2 94%   Physical Exam  Constitutional: She is oriented to person, place, and time. She appears well-developed and well-nourished. No distress.  HENT:  Head: Normocephalic and atraumatic.  Large tongue, no uvula, no tonsils.  No noted FB.   Eyes: Pupils are equal, round, and reactive to light. EOM are normal.  Neck: Normal range of motion. Neck supple.  Cardiovascular: Normal rate and regular rhythm. Exam reveals no gallop and no friction rub.  No murmur heard. Pulmonary/Chest: Effort normal. She has no wheezes. She has no rales.  Abdominal: Soft. She exhibits no distension and no mass. There is no tenderness. There is no guarding.  Musculoskeletal: She exhibits no edema or tenderness.  Neurological: She is alert and oriented to person, place, and time.  Skin: Skin is warm and dry. She is not diaphoretic.  Psychiatric: She has a normal mood and affect. Her behavior is normal.  Nursing note and vitals reviewed.    ED Treatments / Results  Labs (all labs ordered are listed, but only abnormal results are displayed) Labs Reviewed - No data to display  EKG None  Radiology No results found.  Procedures Procedures (including critical care time)  Medications Ordered in ED Medications  lidocaine (XYLOCAINE) 2 % viscous mouth solution 15 mL (15 mLs Mouth/Throat Given 04/17/18 1040)     Initial Impression / Assessment and Plan / ED Course  I have reviewed the triage vital signs and the nursing notes.  Pertinent labs & imaging results that were  available during my care of the patient were  reviewed by me and considered in my medical decision making (see chart for details).     84 yo F with a cc of foreign body sensation to the throat.  Tolerating secretions, no noted FB. Given lido with improvement.  D/c home.   1:35 PM:  I have discussed the diagnosis/risks/treatment options with the patient and family and believe the pt to be eligible for discharge home to follow-up with PCP. We also discussed returning to the ED immediately if new or worsening sx occur. We discussed the sx which are most concerning (e.g., sudden worsening pain, fever, inability to tolerate by mouth) that necessitate immediate return. Medications administered to the patient during their visit and any new prescriptions provided to the patient are listed below.  Medications given during this visit Medications  lidocaine (XYLOCAINE) 2 % viscous mouth solution 15 mL (15 mLs Mouth/Throat Given 04/17/18 1040)      The patient appears reasonably screen and/or stabilized for discharge and I doubt any other medical condition or other [facility] requiring further screening, evaluation, or treatment in the ED at this time prior to discharge.    Final Clinical Impressions(s) / ED Diagnoses   Final diagnoses:  Sensation of foreign body in throat    ED Discharge Orders    None       Deno Etienne, DO 04/17/18 1335

## 2018-05-31 ENCOUNTER — Ambulatory Visit: Payer: Medicare Other | Admitting: Neurology

## 2018-06-05 ENCOUNTER — Emergency Department (HOSPITAL_COMMUNITY): Payer: Medicare Other

## 2018-06-05 ENCOUNTER — Encounter (HOSPITAL_COMMUNITY): Payer: Self-pay

## 2018-06-05 ENCOUNTER — Emergency Department (HOSPITAL_COMMUNITY)
Admission: EM | Admit: 2018-06-05 | Discharge: 2018-06-05 | Disposition: A | Discharge status: Home / Self Care | Payer: Medicare Other | Attending: Emergency Medicine | Admitting: Emergency Medicine

## 2018-06-05 DIAGNOSIS — Y929 Unspecified place or not applicable: Secondary | ICD-10-CM | POA: Insufficient documentation

## 2018-06-05 DIAGNOSIS — S63501A Unspecified sprain of right wrist, initial encounter: Secondary | ICD-10-CM | POA: Insufficient documentation

## 2018-06-05 DIAGNOSIS — Z79899 Other long term (current) drug therapy: Secondary | ICD-10-CM | POA: Diagnosis not present

## 2018-06-05 DIAGNOSIS — I10 Essential (primary) hypertension: Secondary | ICD-10-CM | POA: Insufficient documentation

## 2018-06-05 DIAGNOSIS — S43401A Unspecified sprain of right shoulder joint, initial encounter: Secondary | ICD-10-CM

## 2018-06-05 DIAGNOSIS — G20 Parkinson's disease: Secondary | ICD-10-CM | POA: Diagnosis not present

## 2018-06-05 DIAGNOSIS — Y9389 Activity, other specified: Secondary | ICD-10-CM | POA: Diagnosis not present

## 2018-06-05 DIAGNOSIS — Z7984 Long term (current) use of oral hypoglycemic drugs: Secondary | ICD-10-CM | POA: Insufficient documentation

## 2018-06-05 DIAGNOSIS — S4991XA Unspecified injury of right shoulder and upper arm, initial encounter: Secondary | ICD-10-CM | POA: Diagnosis present

## 2018-06-05 DIAGNOSIS — W050XXA Fall from non-moving wheelchair, initial encounter: Secondary | ICD-10-CM | POA: Insufficient documentation

## 2018-06-05 DIAGNOSIS — J45909 Unspecified asthma, uncomplicated: Secondary | ICD-10-CM | POA: Diagnosis not present

## 2018-06-05 DIAGNOSIS — W19XXXA Unspecified fall, initial encounter: Secondary | ICD-10-CM

## 2018-06-05 DIAGNOSIS — Y999 Unspecified external cause status: Secondary | ICD-10-CM | POA: Diagnosis not present

## 2018-06-05 NOTE — ED Notes (Signed)
Attempted to call [facility] for report, sat on hold for 10+ minutes

## 2018-06-05 NOTE — Discharge Instructions (Addendum)
Take over-the-counter medications as needed for pain, follow-up with your primary care doctor

## 2018-06-05 NOTE — ED Provider Notes (Signed)
[facility] EMERGENCY DEPARTMENT Provider Note   CSN: [id number] Arrival date & time: 06/05/18  1916     History   Chief Complaint Chief Complaint  Patient presents with  . Fall    HPI [name] is a 84 y.o. female.  HPI Patient presented to the emergency room for evaluation of a fall.  Patient states she was getting out of her wheelchair when the wheelchair was not locked and she tripped and fell over the wheelchair.  Patient states she landed on her head, hand and right shoulder.  Patient has a headache.  He is not sure if she lost consciousness.  She denies any neck pain.  She does have some pain in her right shoulder as well as her right hand and wrist.  No complaints of chest pain or shortness of breath.  No abdominal pain.  No hip or lower extremity pain. Past Medical History:  Diagnosis Date  . Abnormality of gait 02/05/2015  . Anxiety   . Arthritis   . Asthma   . Cerebrovascular disease    CVA '03  . Depression   . Dyslipidemia   . Endometriosis   . GERD (gastroesophageal reflux disease)   . Glaucoma   . Headache(784.0)   . Hypertension   . Macular degeneration    bilateral  . Obesity   . Paralysis agitans ([place]) 08/31/2013  . Parkinson disease ([place])   . TIA (transient ischemic attack)    Multiple    Patient Active Problem List   Diagnosis Date Noted  . Tachycardia 08/12/2015  . Abnormality of gait 02/05/2015  . Palpitations 05/28/2014  . TIA (transient ischemic attack) 05/25/2014  . Dyslipidemia 05/25/2014  . Parkinson disease ([place]) 05/25/2014  . H/O: CVA (cerebrovascular accident) 05/25/2014  . Paralysis agitans ([place]) 08/31/2013  . Dizziness and giddiness 08/31/2013    Past Surgical History:  Procedure Laterality Date  . ABDOMINAL HYSTERECTOMY    . CATARACT EXTRACTION, BILATERAL    . GALLBLADDER SURGERY    . LOOP RECORDER IMPLANT  05-28-14   MDT LINQ implanted by Dr Caryl Comes for cryptogenic stroke/palpitations  . LOOP  RECORDER IMPLANT N/A 05/28/2014   Procedure: LOOP RECORDER IMPLANT;  Surgeon: Deboraha Sprang, MD;  Location: [facility] CATH LAB;  Service: Cardiovascular;  Laterality: N/A;  . PILONIDAL CYST RESECTION    . TILT TABLE STUDY  05-28-14   orthostatic hypertension  . TILT TABLE STUDY N/A 05/28/2014   Procedure: TILT TABLE STUDY;  Surgeon: Deboraha Sprang, MD;  Location: [facility] CATH LAB;  Service: Cardiovascular;  Laterality: N/A;  . TONSILLECTOMY       OB History   None      Home Medications    Prior to Admission medications   Medication Sig Start Date End Date Taking? Authorizing Provider  acetaminophen (TYLENOL) 325 MG tablet Take 325 mg by mouth every 4 (four) hours as needed for mild pain or fever.     [provider]  albuterol (VENTOLIN HFA) 108 (90 Base) MCG/ACT inhaler Inhale 2 puffs into the lungs every 6 (six) hours as needed for wheezing or shortness of breath.    [provider]  ALPRAZolam Duanne Moron) 0.5 MG tablet Take 0.5 mg by mouth 3 (three) times daily.     [provider]  alum & mag hydroxide-simeth (MINTOX) 200-200-20 MG/5ML suspension Take 30 mLs by mouth 4 (four) times daily as needed for indigestion or heartburn.    [provider]  atorvastatin (LIPITOR) 10  MG tablet Take 10 mg by mouth at bedtime.     [provider]  benzonatate (TESSALON) 100 MG capsule Take 100 mg by mouth 3 (three) times daily as needed for cough.    [provider]  Carbidopa-Levodopa ER (SINEMET CR) 25-100 MG tablet controlled release Take 1 tablet by mouth 3 (three) times daily. 11/24/17   Kathrynn Ducking, MD  Cholecalciferol (VITAMIN D3) 5000 units CAPS Take 5,000 Units by mouth daily at 12 noon.     [provider]  clopidogrel (PLAVIX) 75 MG tablet Take 75 mg by mouth daily at 12 noon.     [provider]  Corn Starch POWD Apply 1 application topically 2 (two) times daily as needed (between skin folds).    [provider]    dicyclomine (BENTYL) 20 MG tablet Take 20 mg by mouth every 4 (four) hours as needed for spasms.    [provider]  diltiazem (CARDIZEM CD) 120 MG 24 hr capsule Take 240 mg by mouth daily.    [provider]  DULoxetine (CYMBALTA) 30 MG capsule Take 30 mg by mouth daily.    [provider]  glucose blood test strip 1 each by Other route as needed for other ([place]). Use as instructed    [provider]  guaiFENesin (MUCINEX) 600 MG 12 hr tablet Take 600 mg by mouth 2 (two) times daily as needed for cough (and/or congestion).    [provider]  guaiFENesin-codeine (ROBITUSSIN AC) 100-10 MG/5ML syrup Take 10 mLs by mouth every 4 (four) hours as needed for cough.    [provider]  guaiFENesin-dextromethorphan (ROBITUSSIN DM) 100-10 MG/5ML syrup Take 10 mLs by mouth every 4 (four) hours as needed for cough.    [provider]  loperamide (IMODIUM) 2 MG capsule Take 2 mg by mouth as needed for diarrhea or loose stools.    [provider]  loratadine (CLARITIN) 10 MG tablet Take 10 mg by mouth daily as needed for allergies.    [provider]  losartan (COZAAR) 25 MG tablet Take 25 mg by mouth at bedtime.     [provider]  Melatonin 3 MG CAPS Take 3 mg by mouth at bedtime.    [provider]  metFORMIN (GLUCOPHAGE) 500 MG tablet Take 500 mg by mouth 2 (two) times daily before a meal.     [provider]  montelukast (SINGULAIR) 10 MG tablet Take 10 mg by mouth daily at 12 noon.     [provider]  Multiple Vitamin (TAB-A-VITE) TABS Take 1 tablet by mouth daily at 12 noon.     [provider]  oxyCODONE (OXY IR/ROXICODONE) 5 MG immediate release tablet Take 2.5 mg by mouth 2 (two) times daily.     [provider]  oxymetazoline (AFRIN) 0.05 % nasal spray Place 1 spray into both nostrils as needed (for nose bleeds).     [provider]  pantoprazole (PROTONIX) 40 MG tablet Take 40 mg by mouth daily before breakfast.     [provider]  polyethylene glycol (MIRALAX / GLYCOLAX) packet Take 17 g by mouth daily as needed for mild constipation or moderate constipation.     [provider]  promethazine (PHENERGAN) 25 MG tablet Take 25 mg by mouth every 6 (six) hours as needed for nausea or vomiting.    [provider]  simethicone (MYLICON) 80 MG chewable tablet Chew 80 mg by  mouth 4 (four) times daily as needed for flatulence.    [provider]  sodium chloride (OCEAN) 0.65 % SOLN nasal spray Place 1 spray into both nostrils 4 (four) times daily as needed for congestion.    [provider]  talc (ZEASORB) powder Apply 1 application topically 2 (two) times daily as needed (for irritation).    [provider]  Travoprost, BAK Free, (TRAVATAN) 0.004 % SOLN ophthalmic solution Place 1 drop into both eyes at bedtime.    [provider]    Family History Family History  Problem Relation Age of Onset  . Heart attack Mother   . Arthritis Brother   . Skin cancer Brother     Social History Social History   Tobacco Use  . Smoking status: Never Smoker  . Smokeless tobacco: Never Used  Substance Use Topics  . Alcohol use: No  . Drug use: No     Allergies   Comtan [entacapone]; Mirapex [pramipexole dihydrochloride]; Sulfa antibiotics; and Ciprofloxacin   Review of Systems Review of Systems  All other systems reviewed and are negative.    Physical Exam Updated Vital Signs BP (!) 143/70 (BP Location: Left Arm)   Pulse 77   Resp 20   SpO2 94%   Physical Exam  Constitutional: She appears well-developed and well-nourished. No distress.  HENT:  Head: Normocephalic.  Right Ear: External ear normal.  Left Ear: External ear normal.  Small abrasion right forehead  Eyes: Conjunctivae are normal. Right eye exhibits no discharge. Left  eye exhibits no discharge. No scleral icterus.  Neck: Neck supple. No tracheal deviation present.  Cardiovascular: Normal rate, regular rhythm and intact distal pulses.  Pulmonary/Chest: Effort normal and breath sounds normal. No stridor. No respiratory distress. She has no wheezes. She has no rales.  Abdominal: Soft. Bowel sounds are normal. She exhibits no distension. There is no tenderness. There is no rebound and no guarding.  Musculoskeletal: She exhibits no edema.       Right shoulder: She exhibits tenderness. She exhibits no bony tenderness and no swelling.       Left shoulder: She exhibits no tenderness, no bony tenderness and no swelling.       Right wrist: She exhibits tenderness. She exhibits no bony tenderness and no swelling.       Left wrist: She exhibits no tenderness, no bony tenderness and no swelling.       Right hip: She exhibits normal range of motion, no tenderness, no bony tenderness and no swelling.       Left hip: She exhibits normal range of motion, no tenderness and no bony tenderness.       Right ankle: She exhibits no swelling. No tenderness.       Left ankle: She exhibits no swelling. No tenderness.       Cervical back: She exhibits no tenderness, no bony tenderness and no swelling.       Thoracic back: She exhibits no tenderness, no bony tenderness and no swelling.       Lumbar back: She exhibits no tenderness, no bony tenderness and no swelling.       Right hand: She exhibits tenderness.  Neurological: She is alert. She has normal strength. No cranial nerve deficit (no facial droop, extraocular movements intact, no slurred speech) or sensory deficit. She exhibits normal muscle tone. She displays no seizure activity. Coordination normal.  Skin: Skin is warm and dry. No rash noted.  Psychiatric: She has a normal mood  and affect.  Nursing note and vitals reviewed.    ED Treatments / Results  Labs (all labs ordered are listed, but only abnormal results are  displayed) Labs Reviewed - No data to display  EKG None  Radiology Dg Shoulder Right  Result Date: 06/05/2018 CLINICAL DATA:  Fall, known right humerus fracture EXAM: RIGHT SHOULDER - 2+ VIEW COMPARISON:  None. FINDINGS: Old ununited right proximal/mid humeral shaft fracture with angulation. Mild degenerative changes of the right shoulder. The visualized soft tissues are unremarkable. Visualized right lung is clear. 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 Electronically Signed   By: Julian Hy M.D.   On: 06/05/2018 21:30   Dg Wrist Complete Right  Result Date: 06/05/2018 CLINICAL DATA:  Fall EXAM: RIGHT WRIST - COMPLETE 3+ VIEW COMPARISON:  11/07/2015 FINDINGS: No acute fracture or dislocation is seen. Old/healed deformity related to prior radial styloid fracture. The visualized soft tissues are unremarkable. IMPRESSION: Old/healed deformity related to prior radial styloid fracture. No acute fracture is seen. Electronically Signed   By: Julian Hy M.D.   On: 06/05/2018 21:31   Ct Head Wo Contrast  Result Date: 06/05/2018 CLINICAL DATA:  Fall, abrasion to right forehead EXAM: CT HEAD WITHOUT CONTRAST TECHNIQUE: Contiguous axial images were obtained from the base of the skull through the vertex without intravenous contrast. COMPARISON:  10/29/2017 FINDINGS: Brain: No evidence of acute infarction, hemorrhage, hydrocephalus, extra-axial collection or mass lesion/mass effect. Global cortical atrophy. Subcortical white matter and periventricular small vessel ischemic changes. Vascular: Intracranial atherosclerosis. Skull: Normal. Negative for fracture or focal lesion. Sinuses/Orbits: The visualized paranasal sinuses are essentially clear. The mastoid air cells are unopacified. Other: None. IMPRESSION: No evidence of acute intracranial abnormality. Atrophy with small vessel ischemic changes. Electronically Signed   By: Julian Hy M.D.   On: 06/05/2018 20:25    Dg Hand Complete Right  Result Date: 06/05/2018 CLINICAL DATA:  Fall EXAM: RIGHT HAND - COMPLETE 3+ VIEW COMPARISON:  None. FINDINGS: No fracture or dislocation is seen. The joint spaces are essentially preserved. The visualized soft tissues are unremarkable. IMPRESSION: Negative. Electronically Signed   By: Julian Hy M.D.   On: 06/05/2018 21:32    Procedures Procedures (including critical care time)  Medications Ordered in ED Medications - No data to display   Initial Impression / Assessment and Plan / ED Course  I have reviewed the triage vital signs and the nursing notes.  Pertinent labs & imaging results that were available during my care of the patient were reviewed by me and considered in my medical decision making (see chart for details).  Clinical Course as of Jun 05 2210  Nancy Fetter Jun 05, 2018  2211 X-rays reviewed.  Evidence of old injury but no acute fracture   [JK]    Clinical Course User Index [JK] Dorie Rank, MD   Pt presents after a mechanical fall. No evidence of serious injury associated with the fall.  Consistent with soft tissue injury/strain.  Explained findings to patient and warning signs that should prompt return to the ED.   Final Clinical Impressions(s) / ED Diagnoses   Final diagnoses:  Fall, initial encounter  Sprain of right wrist, initial encounter  Sprain of right shoulder, unspecified shoulder sprain type, initial encounter    ED Discharge Orders    None       Dorie Rank, MD 06/05/18 2211

## 2018-06-05 NOTE — ED Triage Notes (Signed)
Patient BIB [facility] from [facility] for fall this afternoon. Patient walked to bathroom and fell on RIGHT hand, should, and head. NO LOC. Small abrasion no RIGHT forehead. No other complaints at this time. Patient currently taking Plavix.   BP 180/98 HR 83 O2 93% RR 20 CBG 133

## 2018-06-13 ENCOUNTER — Non-Acute Institutional Stay: Payer: Medicare Other | Admitting: Nurse Practitioner

## 2018-06-13 ENCOUNTER — Encounter: Payer: Self-pay | Admitting: Nurse Practitioner

## 2018-06-13 DIAGNOSIS — G8929 Other chronic pain: Secondary | ICD-10-CM

## 2018-06-13 DIAGNOSIS — M25511 Pain in right shoulder: Secondary | ICD-10-CM

## 2018-06-13 DIAGNOSIS — Z515 Encounter for palliative care: Secondary | ICD-10-CM

## 2018-06-13 DIAGNOSIS — R269 Unspecified abnormalities of gait and mobility: Secondary | ICD-10-CM

## 2018-06-13 NOTE — Progress Notes (Signed)
PALLIATIVE CARE CONSULT VISIT   PATIENT NAME: [name] (room 119) DOB: 12/11/1929 MRN: [id number]  PRIMARY CARE PROVIDER:   Jacklyn Shell, FNP  REFERRING PROVIDER:  Jacklyn Shell, [place],  [address]  RESPONSIBLE PARTY:   Courtni Balash (son) [phone] (home) [phone]  (cell)         ASSESSMENT/RECOMMENDATIONS :  Frequent Falls -s/p CVA with Rt. Hemiparesis -gait disturbance -Parkinson's disease  -patient encouraged not to attempt transfers out of wheelchair without assistance -Is followed by DR. Roxy Cedar for parkinson's appointment tomorrow -continue sinemet as ordered   Memory disturbance -patient oriented x4 -eating and sleeping well -weight stable  Chronic medical issues -HTN -HLD -depression- -chronic right shoulder pain  -managed by primary care physician -patient denies current depression/anxiety -fracture from fall years ago (no treatment)  -patient reports     ACP -DNR    I spent 20 minutes providing this consultation,  from 14:30 to 14:50. More than 50% of the time in this consultation was spent coordinating communication.   HISTORY OF PRESENT ILLNESS:  [name] is a 84 y.o. year old female with multiple medical problems including parkinson's, gait disturbance, frequent falls, chronic right shoulder pain. Palliative Care was asked to assist with symptom management, and ongoing patient and family support.  CODE STATUS: DNR  PPS: 30% HOSPICE ELIGIBILITY/DIAGNOSIS: TBD  PAST MEDICAL HISTORY:  Past Medical History:  Diagnosis Date  . Abnormality of gait 02/05/2015  . Anxiety   . Arthritis   . Asthma   . Cerebrovascular disease    CVA '03  . Depression   . Dyslipidemia   . Endometriosis   . GERD (gastroesophageal reflux disease)   . Glaucoma   . Headache(784.0)   . Hypertension   . Macular degeneration    bilateral  . Obesity   . Paralysis agitans ([place]) 08/31/2013  . Parkinson  disease ([place])   . TIA (transient ischemic attack)    Multiple    SOCIAL HX:  Social History   Tobacco Use  . Smoking status: Never Smoker  . Smokeless tobacco: Never Used  Substance Use Topics  . Alcohol use: No    ALLERGIES:  Allergies  Allergen Reactions  . Comtan [Entacapone] Other (See Comments)    Reaction:  Unknown   . Mirapex [Pramipexole Dihydrochloride] Other (See Comments)    Reaction:  Unknown   . Sulfa Antibiotics Nausea And Vomiting  . Ciprofloxacin Other (See Comments)    Reaction:  Made pt feel crazy      PERTINENT MEDICATIONS:  Outpatient Encounter Medications as of 06/13/2018  Medication Sig  . acetaminophen (TYLENOL) 325 MG tablet Take 325 mg by mouth every 4 (four) hours as needed for mild pain or fever.   Marland Kitchen albuterol (VENTOLIN HFA) 108 (90 Base) MCG/ACT inhaler Inhale 2 puffs into the lungs every 6 (six) hours as needed for wheezing or shortness of breath.  . ALPRAZolam (XANAX) 0.5 MG tablet Take 0.5 mg by mouth 3 (three) times daily.   Marland Kitchen alum & mag hydroxide-simeth (MINTOX) 696-295-28 MG/5ML suspension Take 30 mLs by mouth 4 (four) times daily as needed for indigestion or heartburn.  Marland Kitchen atorvastatin (LIPITOR) 10 MG tablet Take 10 mg by mouth at bedtime.   . benzonatate (TESSALON) 100 MG capsule Take 100 mg by mouth 3 (three) times daily as needed for cough.  . Carbidopa-Levodopa ER (SINEMET CR) 25-100 MG tablet controlled release Take 1 tablet by mouth 3 (three) times daily.  Marland Kitchen  Cholecalciferol (VITAMIN D3) 5000 units CAPS Take 5,000 Units by mouth daily at 12 noon.   . clopidogrel (PLAVIX) 75 MG tablet Take 75 mg by mouth daily at 12 noon.   . Corn Starch POWD Apply 1 application topically 2 (two) times daily as needed (between skin folds).  . dicyclomine (BENTYL) 20 MG tablet Take 20 mg by mouth every 4 (four) hours as needed for spasms.  Marland Kitchen diltiazem (CARDIZEM CD) 120 MG 24 hr capsule Take 240 mg by mouth daily.  . DULoxetine (CYMBALTA) 30 MG capsule Take  30 mg by mouth daily.  Marland Kitchen glucose blood test strip 1 each by Other route as needed for other ([place]). Use as instructed  . guaiFENesin (MUCINEX) 600 MG 12 hr tablet Take 600 mg by mouth 2 (two) times daily as needed for cough (and/or congestion).  Marland Kitchen guaiFENesin-codeine (ROBITUSSIN AC) 100-10 MG/5ML syrup Take 10 mLs by mouth every 4 (four) hours as needed for cough.  Marland Kitchen guaiFENesin-dextromethorphan (ROBITUSSIN DM) 100-10 MG/5ML syrup Take 10 mLs by mouth every 4 (four) hours as needed for cough.  . loperamide (IMODIUM) 2 MG capsule Take 2 mg by mouth as needed for diarrhea or loose stools.  Marland Kitchen loratadine (CLARITIN) 10 MG tablet Take 10 mg by mouth daily as needed for allergies.  Marland Kitchen losartan (COZAAR) 25 MG tablet Take 25 mg by mouth at bedtime.   . Melatonin 3 MG CAPS Take 3 mg by mouth at bedtime.  . metFORMIN (GLUCOPHAGE) 500 MG tablet Take 500 mg by mouth 2 (two) times daily before a meal.   . montelukast (SINGULAIR) 10 MG tablet Take 10 mg by mouth daily at 12 noon.   . Multiple Vitamin (TAB-A-VITE) TABS Take 1 tablet by mouth daily at 12 noon.   Marland Kitchen oxyCODONE (OXY IR/ROXICODONE) 5 MG immediate release tablet Take 2.5 mg by mouth 2 (two) times daily.   Marland Kitchen oxymetazoline (AFRIN) 0.05 % nasal spray Place 1 spray into both nostrils as needed (for nose bleeds).  . pantoprazole (PROTONIX) 40 MG tablet Take 40 mg by mouth daily before breakfast.   . polyethylene glycol (MIRALAX / GLYCOLAX) packet Take 17 g by mouth daily as needed for mild constipation or moderate constipation.   . promethazine (PHENERGAN) 25 MG tablet Take 25 mg by mouth every 6 (six) hours as needed for nausea or vomiting.  . simethicone (MYLICON) 80 MG chewable tablet Chew 80 mg by mouth 4 (four) times daily as needed for flatulence.  . sodium chloride (OCEAN) 0.65 % SOLN nasal spray Place 1 spray into both nostrils 4 (four) times daily as needed for congestion.  Marland Kitchen talc (ZEASORB) powder Apply 1  application topically 2 (two) times daily as needed (for irritation).  . Travoprost, BAK Free, (TRAVATAN) 0.004 % SOLN ophthalmic solution Place 1 drop into both eyes at bedtime.   No facility-administered encounter medications on file as of 06/13/2018.     PHYSICAL EXAM:   General: NAD, obese sitting in recliner Cardiovascular: regular rate and rhythm Pulmonary: clear ant fields Abdomen: soft, nontender, + bowel sounds GU: no suprapubic tenderness Extremities: no edema, no joint deformities Skin: no rashes Neurological: Weakness but otherwise nonfocal  Stephanie G [place], NP

## 2018-06-14 ENCOUNTER — Ambulatory Visit: Payer: Medicare Other | Admitting: Neurology

## 2018-06-14 ENCOUNTER — Encounter: Payer: Self-pay | Admitting: Neurology

## 2018-06-14 VITALS — BP 126/84 | Wt 180.0 lb

## 2018-06-14 DIAGNOSIS — G20 Parkinson's disease: Secondary | ICD-10-CM | POA: Diagnosis not present

## 2018-06-14 DIAGNOSIS — R269 Unspecified abnormalities of gait and mobility: Secondary | ICD-10-CM | POA: Diagnosis not present

## 2018-06-14 MED ORDER — CARBIDOPA-LEVODOPA ER 25-100 MG PO TBCR: 1.5000 | EXTENDED_RELEASE_TABLET | Freq: Three times a day (TID) | ORAL | 3 refills | Status: DC

## 2018-06-14 NOTE — Patient Instructions (Signed)
We will increase the Sinemet 25/100 mg to 1.5 tablets three times a day.

## 2018-06-14 NOTE — Progress Notes (Signed)
Reason for visit: Parkinson's disease, gait disorder  [name] is an 84 y.o. female  History of present illness:  [name] is an 83 year old right-handed white female with a history of Parkinson's disease associated with a significant gait disorder.  The patient continues to fall regularly, she is in an assisted living situation currently, her son is considering transferring her to [place] with a skilled level assistance.  The patient mainly is wheelchair-bound, when she has to go to the bathroom she will call for help and she indicates that a lot of times no one will come in a timely fashion, she tries to get to the bathroom on her own and she will fall.  She was in the emergency room on 05 June 2018 when she fell and hit her head, CT scan of the brain was done and did not show acute traumatic changes but there does appear to be extensive white matter disease that could also impact her balance and could potentially result in vascular parkinsonism.  The patient is on Sinemet taking the 25/100 mg tablets 3 times daily, she has been on much higher doses in the past.  She does report some difficulty with choking with swallowing on occasion.  She sleeps well at night.  She will have occasional hallucinations that are associated with a sensation that her husband is in bed with her, she is not having any frightening experiences.  She returns for an evaluation.  Past Medical History:  Diagnosis Date  . Abnormality of gait 02/05/2015  . Anxiety   . Arthritis   . Asthma   . Cerebrovascular disease    CVA '03  . Depression   . Dyslipidemia   . Endometriosis   . GERD (gastroesophageal reflux disease)   . Glaucoma   . Headache(784.0)   . Hypertension   . Macular degeneration    bilateral  . Obesity   . Paralysis agitans ([place]) 08/31/2013  . Parkinson disease ([place])   . TIA (transient ischemic attack)    Multiple    Past Surgical History:  Procedure Laterality Date    . ABDOMINAL HYSTERECTOMY    . CATARACT EXTRACTION, BILATERAL    . GALLBLADDER SURGERY    . LOOP RECORDER IMPLANT  05-28-14   MDT LINQ implanted by Dr [name] for cryptogenic stroke/palpitations  . LOOP RECORDER IMPLANT N/A 05/28/2014   Procedure: LOOP RECORDER IMPLANT;  Surgeon: Deboraha Sprang, MD;  Location: [facility] CATH LAB;  Service: Cardiovascular;  Laterality: N/A;  . PILONIDAL CYST RESECTION    . TILT TABLE STUDY  05-28-14   orthostatic hypertension  . TILT TABLE STUDY N/A 05/28/2014   Procedure: TILT TABLE STUDY;  Surgeon: Deboraha Sprang, MD;  Location: [facility] CATH LAB;  Service: Cardiovascular;  Laterality: N/A;  . TONSILLECTOMY      Family History  Problem Relation Age of Onset  . Heart attack Mother   . Arthritis Brother   . Skin cancer Brother     Social history:  reports that she has never smoked. She has never used smokeless tobacco. She reports that she does not drink alcohol or use drugs.    Allergies  Allergen Reactions  . Comtan [Entacapone] Other (See Comments)    Reaction:  Unknown   . Mirapex [Pramipexole Dihydrochloride] Other (See Comments)    Reaction:  Unknown   . Sulfa Antibiotics Nausea And Vomiting  . Ciprofloxacin Other (See Comments)    Reaction:  Made pt feel crazy  Medications:  Prior to Admission medications   Medication Sig Start Date End Date Taking? Authorizing Provider  acetaminophen (TYLENOL) 325 MG tablet Take 325 mg by mouth every 4 (four) hours as needed for mild pain or fever.    Yes [provider]  albuterol (VENTOLIN HFA) 108 (90 Base) MCG/ACT inhaler Inhale 2 puffs into the lungs every 6 (six) hours as needed for wheezing or shortness of breath.   Yes [provider]  ALPRAZolam Duanne Moron) 0.5 MG tablet Take 0.5 mg by mouth 3 (three) times daily.    Yes [provider]  atorvastatin (LIPITOR) 10 MG tablet Take 10 mg by mouth at bedtime.    Yes [provider]  Carbidopa-Levodopa ER (SINEMET CR) 25-100 MG  tablet controlled release Take 1.5 tablets by mouth 3 (three) times daily. 06/14/18  Yes Kathrynn Ducking, MD  Cholecalciferol (VITAMIN D3) 5000 units CAPS Take 5,000 Units by mouth daily at 12 noon.    Yes [provider]  clopidogrel (PLAVIX) 75 MG tablet Take 75 mg by mouth daily at 12 noon.    Yes [provider]  dicyclomine (BENTYL) 20 MG tablet Take 20 mg by mouth every 4 (four) hours as needed for spasms.   Yes [provider]  diltiazem (CARDIZEM CD) 120 MG 24 hr capsule Take 240 mg by mouth daily.   Yes [provider]  DULoxetine (CYMBALTA) 30 MG capsule Take 30 mg by mouth daily.   Yes [provider]  glucose blood test strip 1 each by Other route as needed for other ([place]). Use as instructed   Yes [provider]  loperamide (IMODIUM) 2 MG capsule Take 2 mg by mouth as needed for diarrhea or loose stools.   Yes [provider]  loratadine (CLARITIN) 10 MG tablet Take 10 mg by mouth daily as needed for allergies.   Yes [provider]  losartan (COZAAR) 25 MG tablet Take 25 mg by mouth at bedtime.    Yes [provider]  Melatonin 3 MG CAPS Take 3 mg by mouth at bedtime.   Yes [provider]  metFORMIN (GLUCOPHAGE) 500 MG tablet Take 500 mg by mouth 2 (two) times daily before a meal.    Yes [provider]  montelukast (SINGULAIR) 10 MG tablet Take 10 mg by mouth daily at 12 noon.    Yes [provider]  Multiple Vitamin (TAB-A-VITE) TABS Take 1 tablet by mouth daily at 12 noon.    Yes [provider]  oxyCODONE (OXY IR/ROXICODONE) 5 MG immediate release tablet Take 2.5 mg by mouth 2 (two) times daily.    Yes [provider]  oxymetazoline (AFRIN) 0.05 % nasal spray Place 1 spray into both nostrils as needed (for nose bleeds).   Yes [provider]  pantoprazole (PROTONIX) 40 MG tablet Take 40 mg by mouth daily  before breakfast.    Yes [provider]  polyethylene glycol (MIRALAX / GLYCOLAX) packet Take 17 g by mouth daily as needed for mild constipation or moderate constipation.    Yes [provider]  promethazine (PHENERGAN) 25 MG tablet Take 25 mg by mouth every 6 (six) hours as needed for nausea or vomiting.   Yes [provider]  simethicone (MYLICON) 80 MG chewable tablet Chew 80 mg by mouth 4 (four) times daily as needed for flatulence.   Yes [provider]  sodium chloride (OCEAN) 0.65 % SOLN nasal spray Place  1 spray into both nostrils 4 (four) times daily as needed for congestion.   Yes [provider]  Travoprost, BAK Free, (TRAVATAN) 0.004 % SOLN ophthalmic solution Place 1 drop into both eyes at bedtime.   Yes [provider]    ROS:  Out of a complete 14 system review of symptoms, the patient complains only of the following symptoms, and all other reviewed systems are negative.  Walking problem Hallucinations Swallowing problems  Blood pressure 126/84, weight 180 lb (81.6 kg).  Physical Exam  General: The patient is alert and cooperative at the time of the examination.  The patient is moderately to markedly obese.  Skin: No significant peripheral edema is noted.   Neurologic Exam  Mental status: The patient is alert and oriented x 3 at the time of the examination. The patient has apparent normal recent and remote memory, with an apparently normal attention span and concentration ability.   Cranial nerves: Facial symmetry is present. Speech is normal, no aphasia or dysarthria is noted. Extraocular movements are full. Visual fields are full.  Mild masking of the face is seen.  Motor: The patient has good strength in all 4 extremities.  The patient has difficulty with elevation of the right arm at the shoulder.  Sensory examination: Soft touch sensation is symmetric on the face, arms, and legs.  Coordination: The patient  has good finger-nose-finger and heel-to-shin bilaterally.  Gait and station: The patient requires assistance with standing.  Once up, patient is able to take a few steps with assistance, she takes short shuffling steps.  She cannot walk independently.  Reflexes: Deep tendon reflexes are symmetric.   Assessment/Plan:  1.  Parkinson's disease  2.  Gait disturbance, multiple falls  3.  Mild memory disturbance  The patient is continued to have episodic falls.  The patient does require a skilled level assistance at this time.  She is basically wheelchair-bound.  We will go up on the Sinemet to 1.5 tablets of the 25/100 mg tablets 3 times daily.  The patient will follow-up in 5 months.  She requires assistance with bathing, dressing, and walking.  Jill Alexanders MD 06/14/2018 11:44 AM  [facility] [address] [place], [place] [address]  Phone [phone] Fax [phone]

## 2018-06-17 ENCOUNTER — Emergency Department (HOSPITAL_COMMUNITY): Payer: Medicare Other

## 2018-06-17 ENCOUNTER — Other Ambulatory Visit: Payer: Self-pay

## 2018-06-17 ENCOUNTER — Inpatient Hospital Stay (HOSPITAL_COMMUNITY)
Admission: EM | Admit: 2018-06-17 | Discharge: 2018-06-22 | DRG: 871 | Disposition: A | Discharge status: Skilled Nursing Facility | Payer: Medicare Other | Attending: Internal Medicine | Admitting: Internal Medicine

## 2018-06-17 ENCOUNTER — Encounter (HOSPITAL_COMMUNITY): Payer: Self-pay

## 2018-06-17 DIAGNOSIS — E119 Type 2 diabetes mellitus without complications: Secondary | ICD-10-CM | POA: Diagnosis present

## 2018-06-17 DIAGNOSIS — Z79899 Other long term (current) drug therapy: Secondary | ICD-10-CM | POA: Diagnosis not present

## 2018-06-17 DIAGNOSIS — J45909 Unspecified asthma, uncomplicated: Secondary | ICD-10-CM | POA: Diagnosis present

## 2018-06-17 DIAGNOSIS — G9341 Metabolic encephalopathy: Secondary | ICD-10-CM | POA: Diagnosis present

## 2018-06-17 DIAGNOSIS — Z7984 Long term (current) use of oral hypoglycemic drugs: Secondary | ICD-10-CM

## 2018-06-17 DIAGNOSIS — Z79890 Hormone replacement therapy (postmenopausal): Secondary | ICD-10-CM | POA: Diagnosis not present

## 2018-06-17 DIAGNOSIS — Z6835 Body mass index (BMI) 35.0-35.9, adult: Secondary | ICD-10-CM | POA: Diagnosis not present

## 2018-06-17 DIAGNOSIS — H409 Unspecified glaucoma: Secondary | ICD-10-CM | POA: Diagnosis present

## 2018-06-17 DIAGNOSIS — E876 Hypokalemia: Secondary | ICD-10-CM | POA: Diagnosis not present

## 2018-06-17 DIAGNOSIS — F0390 Unspecified dementia without behavioral disturbance: Secondary | ICD-10-CM | POA: Diagnosis present

## 2018-06-17 DIAGNOSIS — Z993 Dependence on wheelchair: Secondary | ICD-10-CM

## 2018-06-17 DIAGNOSIS — E785 Hyperlipidemia, unspecified: Secondary | ICD-10-CM | POA: Diagnosis present

## 2018-06-17 DIAGNOSIS — Z8673 Personal history of transient ischemic attack (TIA), and cerebral infarction without residual deficits: Secondary | ICD-10-CM | POA: Diagnosis not present

## 2018-06-17 DIAGNOSIS — Z881 Allergy status to other antibiotic agents status: Secondary | ICD-10-CM

## 2018-06-17 DIAGNOSIS — Z8709 Personal history of other diseases of the respiratory system: Secondary | ICD-10-CM | POA: Diagnosis present

## 2018-06-17 DIAGNOSIS — I10 Essential (primary) hypertension: Secondary | ICD-10-CM | POA: Diagnosis not present

## 2018-06-17 DIAGNOSIS — Z95818 Presence of other cardiac implants and grafts: Secondary | ICD-10-CM

## 2018-06-17 DIAGNOSIS — G20 Parkinson's disease: Secondary | ICD-10-CM | POA: Diagnosis present

## 2018-06-17 DIAGNOSIS — F329 Major depressive disorder, single episode, unspecified: Secondary | ICD-10-CM | POA: Diagnosis present

## 2018-06-17 DIAGNOSIS — J9601 Acute respiratory failure with hypoxia: Secondary | ICD-10-CM | POA: Diagnosis present

## 2018-06-17 DIAGNOSIS — Z7902 Long term (current) use of antithrombotics/antiplatelets: Secondary | ICD-10-CM

## 2018-06-17 DIAGNOSIS — J129 Viral pneumonia, unspecified: Secondary | ICD-10-CM | POA: Diagnosis not present

## 2018-06-17 DIAGNOSIS — E669 Obesity, unspecified: Secondary | ICD-10-CM | POA: Diagnosis present

## 2018-06-17 DIAGNOSIS — Z888 Allergy status to other drugs, medicaments and biological substances status: Secondary | ICD-10-CM

## 2018-06-17 DIAGNOSIS — R4182 Altered mental status, unspecified: Secondary | ICD-10-CM

## 2018-06-17 DIAGNOSIS — A419 Sepsis, unspecified organism: Secondary | ICD-10-CM | POA: Diagnosis present

## 2018-06-17 DIAGNOSIS — E860 Dehydration: Secondary | ICD-10-CM | POA: Diagnosis present

## 2018-06-17 DIAGNOSIS — K219 Gastro-esophageal reflux disease without esophagitis: Secondary | ICD-10-CM | POA: Diagnosis present

## 2018-06-17 DIAGNOSIS — R0600 Dyspnea, unspecified: Secondary | ICD-10-CM

## 2018-06-17 DIAGNOSIS — N390 Urinary tract infection, site not specified: Secondary | ICD-10-CM

## 2018-06-17 DIAGNOSIS — J690 Pneumonitis due to inhalation of food and vomit: Secondary | ICD-10-CM | POA: Diagnosis present

## 2018-06-17 DIAGNOSIS — Z882 Allergy status to sulfonamides status: Secondary | ICD-10-CM

## 2018-06-17 LAB — I-STAT CG4 LACTIC ACID, ED
Lactic Acid, Venous: 3.43 mmol/L (ref 0.5–1.9)
Lactic Acid, Venous: 2.68 mmol/L (ref 0.5–1.9)

## 2018-06-17 LAB — COMPREHENSIVE METABOLIC PANEL
ALBUMIN: 3.9 g/dL (ref 3.5–5.0)
ALK PHOS: 71 U/L (ref 38–126)
ALT: 26 U/L (ref 0–44)
AST: 34 U/L (ref 15–41)
Anion gap: 15 (ref 5–15)
BUN: 29 mg/dL — ABNORMAL HIGH (ref 8–23)
CHLORIDE: 102 mmol/L (ref 98–111)
CO2: 22 mmol/L (ref 22–32)
CREATININE: 1.01 mg/dL — AB (ref 0.44–1.00)
Calcium: 10 mg/dL (ref 8.9–10.3)
GFR calc non Af Amer: 49 mL/min — ABNORMAL LOW (ref >60)
GFR, EST AFRICAN AMERICAN: 57 mL/min — AB (ref >60)
GLUCOSE: 136 mg/dL — AB (ref 70–99)
Potassium: 4 mmol/L (ref 3.5–5.1)
SODIUM: 139 mmol/L (ref 135–145)
Total Bilirubin: 0.8 mg/dL (ref 0.3–1.2)
Total Protein: 7.4 g/dL (ref 6.5–8.1)

## 2018-06-17 LAB — URINALYSIS, ROUTINE W REFLEX MICROSCOPIC
Bilirubin Urine: NEGATIVE
GLUCOSE, UA: NEGATIVE mg/dL
KETONES UR: NEGATIVE mg/dL
Leukocytes, UA: NEGATIVE
NITRITE: NEGATIVE
PROTEIN: NEGATIVE mg/dL
Specific Gravity, Urine: 1.016 (ref 1.005–1.030)
pH: 5 (ref 5.0–8.0)

## 2018-06-17 LAB — CBC WITH DIFFERENTIAL/PLATELET
BASOS ABS: 0 10*3/uL (ref 0.0–0.1)
Basophils Relative: 0 %
Eosinophils Absolute: 0.7 10*3/uL (ref 0.0–0.7)
Eosinophils Relative: 5 %
HEMATOCRIT: 39.9 % (ref 36.0–46.0)
Hemoglobin: 12.9 g/dL (ref 12.0–15.0)
LYMPHS ABS: 1.5 10*3/uL (ref 0.7–4.0)
LYMPHS PCT: 11 %
MCH: 30.1 pg (ref 26.0–34.0)
MCHC: 32.3 g/dL (ref 30.0–36.0)
MCV: 93 fL (ref 78.0–100.0)
MONO ABS: 0.7 10*3/uL (ref 0.1–1.0)
Monocytes Relative: 5 %
Neutro Abs: 10.7 10*3/uL — ABNORMAL HIGH (ref 1.7–7.7)
Neutrophils Relative %: 79 %
Platelets: 315 10*3/uL (ref 150–400)
RBC: 4.29 MIL/uL (ref 3.87–5.11)
RDW: 14.2 % (ref 11.5–15.5)
WBC: 13.7 10*3/uL — ABNORMAL HIGH (ref 4.0–10.5)

## 2018-06-17 LAB — PROTIME-INR
INR: 1.06
Prothrombin Time: 13.7 seconds (ref 11.4–15.2)

## 2018-06-17 MED ORDER — IPRATROPIUM-ALBUTEROL 0.5-2.5 (3) MG/3ML IN SOLN
3.0000 mL | Freq: Four times a day (QID) | RESPIRATORY_TRACT | Status: DC
  Administered 2018-06-17: 3 mL via RESPIRATORY_TRACT
  Filled 2018-06-17: qty 3

## 2018-06-17 MED ORDER — VITAMIN D3 25 MCG (1000 UNIT) PO TABS
5000.0000 [IU] | ORAL_TABLET | Freq: Every day | ORAL | Status: DC
  Administered 2018-06-18 – 2018-06-22 (×4): 5000 [IU] via ORAL
  Filled 2018-06-17 (×4): qty 5

## 2018-06-17 MED ORDER — CARBIDOPA-LEVODOPA ER 25-100 MG PO TBCR
1.0000 | EXTENDED_RELEASE_TABLET | Freq: Three times a day (TID) | ORAL | Status: DC
  Administered 2018-06-17 – 2018-06-20 (×10): 1 via ORAL
  Filled 2018-06-17 (×11): qty 1

## 2018-06-17 MED ORDER — ENOXAPARIN SODIUM 40 MG/0.4ML ~~LOC~~ SOLN
40.0000 mg | SUBCUTANEOUS | Status: DC
  Administered 2018-06-17 – 2018-06-21 (×5): 40 mg via SUBCUTANEOUS
  Filled 2018-06-17 (×5): qty 0.4

## 2018-06-17 MED ORDER — LATANOPROST 0.005 % OP SOLN
1.0000 [drp] | Freq: Every day | OPHTHALMIC | Status: DC
  Administered 2018-06-17 – 2018-06-21 (×5): 1 [drp] via OPHTHALMIC
  Filled 2018-06-17: qty 2.5

## 2018-06-17 MED ORDER — IPRATROPIUM-ALBUTEROL 0.5-2.5 (3) MG/3ML IN SOLN: 3.0000 mL | RESPIRATORY_TRACT | Status: DC | PRN

## 2018-06-17 MED ORDER — PANTOPRAZOLE SODIUM 40 MG PO TBEC
40.0000 mg | DELAYED_RELEASE_TABLET | Freq: Every day | ORAL | Status: DC
  Administered 2018-06-18 – 2018-06-21 (×4): 40 mg via ORAL
  Filled 2018-06-17 (×4): qty 1

## 2018-06-17 MED ORDER — IPRATROPIUM-ALBUTEROL 0.5-2.5 (3) MG/3ML IN SOLN: 3.0000 mL | Freq: Two times a day (BID) | RESPIRATORY_TRACT | Status: DC

## 2018-06-17 MED ORDER — CEFTRIAXONE SODIUM 1 G IJ SOLR
1.0000 g | Freq: Once | INTRAMUSCULAR | Status: AC
  Administered 2018-06-17: 1 g via INTRAVENOUS
  Filled 2018-06-17: qty 10

## 2018-06-17 MED ORDER — ACETAMINOPHEN 500 MG PO TABS
500.0000 mg | ORAL_TABLET | Freq: Three times a day (TID) | ORAL | Status: DC
  Administered 2018-06-17 – 2018-06-22 (×15): 500 mg via ORAL
  Filled 2018-06-17 (×19): qty 1

## 2018-06-17 MED ORDER — MONTELUKAST SODIUM 10 MG PO TABS
10.0000 mg | ORAL_TABLET | Freq: Every day | ORAL | Status: DC
  Administered 2018-06-18 – 2018-06-22 (×5): 10 mg via ORAL
  Filled 2018-06-17 (×5): qty 1

## 2018-06-17 MED ORDER — SODIUM CHLORIDE 0.9 % IV SOLN
1.0000 g | INTRAVENOUS | Status: DC
  Administered 2018-06-18: 1 g via INTRAVENOUS
  Filled 2018-06-17: qty 1
  Filled 2018-06-17: qty 10

## 2018-06-17 MED ORDER — ALPRAZOLAM 0.5 MG PO TABS
0.5000 mg | ORAL_TABLET | Freq: Three times a day (TID) | ORAL | Status: DC
  Administered 2018-06-17 – 2018-06-22 (×15): 0.5 mg via ORAL
  Filled 2018-06-17 (×15): qty 1

## 2018-06-17 MED ORDER — DEXTROSE-NACL 5-0.9 % IV SOLN
INTRAVENOUS | Status: DC
  Administered 2018-06-17: 23:00:00 via INTRAVENOUS

## 2018-06-17 MED ORDER — CLOPIDOGREL BISULFATE 75 MG PO TABS
75.0000 mg | ORAL_TABLET | Freq: Every day | ORAL | Status: DC
  Administered 2018-06-18 – 2018-06-22 (×5): 75 mg via ORAL
  Filled 2018-06-17 (×5): qty 1

## 2018-06-17 MED ORDER — OXYMETAZOLINE HCL 0.05 % NA SOLN
1.0000 | NASAL | Status: DC | PRN
  Filled 2018-06-17: qty 15

## 2018-06-17 MED ORDER — DULOXETINE HCL 30 MG PO CPEP
30.0000 mg | ORAL_CAPSULE | Freq: Every day | ORAL | Status: DC
  Administered 2018-06-18 – 2018-06-22 (×5): 30 mg via ORAL
  Filled 2018-06-17 (×5): qty 1

## 2018-06-17 MED ORDER — OXYCODONE HCL 5 MG PO TABS
2.5000 mg | ORAL_TABLET | Freq: Two times a day (BID) | ORAL | Status: DC
  Administered 2018-06-17 – 2018-06-22 (×9): 2.5 mg via ORAL
  Filled 2018-06-17 (×9): qty 1

## 2018-06-17 MED ORDER — TAB-A-VITE PO TABS: 1.0000 | ORAL_TABLET | Freq: Every day | ORAL | Status: DC

## 2018-06-17 MED ORDER — ATORVASTATIN CALCIUM 10 MG PO TABS
10.0000 mg | ORAL_TABLET | Freq: Every day | ORAL | Status: DC
  Administered 2018-06-17 – 2018-06-21 (×5): 10 mg via ORAL
  Filled 2018-06-17 (×6): qty 1

## 2018-06-17 MED ORDER — ONDANSETRON HCL 4 MG/2ML IJ SOLN: 4.0000 mg | Freq: Four times a day (QID) | INTRAMUSCULAR | Status: DC | PRN

## 2018-06-17 MED ORDER — MUSCLE RUB 10-15 % EX CREA
TOPICAL_CREAM | Freq: Three times a day (TID) | CUTANEOUS | Status: DC
  Administered 2018-06-17 – 2018-06-20 (×10): via TOPICAL
  Administered 2018-06-21: 1 via TOPICAL
  Administered 2018-06-21 (×2): via TOPICAL
  Administered 2018-06-22 (×2): 1 via TOPICAL
  Filled 2018-06-17: qty 85

## 2018-06-17 MED ORDER — LOSARTAN POTASSIUM 50 MG PO TABS
25.0000 mg | ORAL_TABLET | Freq: Every day | ORAL | Status: DC
  Administered 2018-06-17 – 2018-06-21 (×5): 25 mg via ORAL
  Filled 2018-06-17 (×5): qty 1

## 2018-06-17 MED ORDER — ONDANSETRON HCL 4 MG PO TABS: 4.0000 mg | ORAL_TABLET | Freq: Four times a day (QID) | ORAL | Status: DC | PRN

## 2018-06-17 MED ORDER — MENTHOL (TOPICAL ANALGESIC) 4 % EX GEL: 1.0000 "application " | Freq: Three times a day (TID) | CUTANEOUS | Status: DC

## 2018-06-17 MED ORDER — MELATONIN 3 MG PO TABS
3.0000 mg | ORAL_TABLET | Freq: Every day | ORAL | Status: DC
  Administered 2018-06-17 – 2018-06-21 (×5): 3 mg via ORAL
  Filled 2018-06-17 (×6): qty 1

## 2018-06-17 MED ORDER — DILTIAZEM HCL ER COATED BEADS 240 MG PO CP24
240.0000 mg | ORAL_CAPSULE | Freq: Every day | ORAL | Status: DC
  Administered 2018-06-18 – 2018-06-20 (×3): 240 mg via ORAL
  Filled 2018-06-17 (×4): qty 1

## 2018-06-17 MED ORDER — ADULT MULTIVITAMIN W/MINERALS CH
1.0000 | ORAL_TABLET | Freq: Every day | ORAL | Status: DC
  Administered 2018-06-18 – 2018-06-22 (×5): 1 via ORAL
  Filled 2018-06-17 (×6): qty 1

## 2018-06-17 MED ORDER — LACTATED RINGERS IV BOLUS
1000.0000 mL | Freq: Once | INTRAVENOUS | Status: AC
Start: 2018-06-17 — End: 2018-06-17
  Administered 2018-06-17: 1000 mL via INTRAVENOUS

## 2018-06-17 NOTE — ED Provider Notes (Signed)
[facility] DEPT Provider Note   CSN: [id number] Arrival date & time: 06/17/18  1522     History   Chief Complaint Chief Complaint  Patient presents with  . Code Sepsis    HPI [name] is a 84 y.o. female.   Recent fall with humerus fracture, on antibiotics for UTI. Facility concern for sepsis given increased lethargy.  Patient has been on ciprofloxacin for UTI.  Has had poor oral intake.  Denies any abdominal pain, chest pain, shortness of breath.   The history is provided by the patient and the EMS personnel.   Altered Mental Status    This is a recurrent problem. The current episode started 2 days ago. The problem has been gradually worsening. Associated symptoms include confusion and weakness. Pertinent negatives include no seizures. Risk factors include a recent infection. Her past medical history is significant for TIA, hypertension and dementia.    Past Medical History:  Diagnosis Date  . Abnormality of gait 02/05/2015  . Anxiety   . Arthritis   . Asthma   . Cerebrovascular disease    CVA '03  . Depression   . Dyslipidemia   . Endometriosis   . GERD (gastroesophageal reflux disease)   . Glaucoma   . Headache(784.0)   . Hypertension   . Macular degeneration    bilateral  . Obesity   . Paralysis agitans ([place]) 08/31/2013  . Parkinson disease ([place])   . TIA (transient ischemic attack)    Multiple    Patient Active Problem List   Diagnosis Date Noted  . Metabolic encephalopathy 63/84/6659  . Tachycardia 08/12/2015  . Abnormality of gait 02/05/2015  . Palpitations 05/28/2014  . TIA (transient ischemic attack) 05/25/2014  . Dyslipidemia 05/25/2014  . Parkinson disease ([place]) 05/25/2014  . H/O: CVA (cerebrovascular accident) 05/25/2014  . Paralysis agitans ([place]) 08/31/2013  . Dizziness and giddiness 08/31/2013    Past Surgical History:  Procedure Laterality Date  . ABDOMINAL HYSTERECTOMY    . CATARACT EXTRACTION,  BILATERAL    . GALLBLADDER SURGERY    . LOOP RECORDER IMPLANT  05-28-14   MDT LINQ implanted by Dr Caryl Comes for cryptogenic stroke/palpitations  . LOOP RECORDER IMPLANT N/A 05/28/2014   Procedure: LOOP RECORDER IMPLANT;  Surgeon: Deboraha Sprang, MD;  Location[facility] LAB;  Service: Cardiovascular;  Laterality: N/A;  . PILONIDAL CYST RESECTION    . TILT TABLE STUDY  05-28-14   orthostatic hypertension  . TILT TABLE STUDY N/A 05/28/2014   Procedure: TILT TABLE STUDY;  Surgeon: Deboraha Sprang, MD;  Location: [facility] CATH LAB;  Service: Cardiovascular;  Laterality: N/A;  . TONSILLECTOMY       OB History   None      Home Medications    Prior to Admission medications   Medication Sig Start Date End Date Taking? Authorizing Provider  Acetaminophen 500 MG coapsule Take 500 mg by mouth 3 (three) times daily.    Yes [provider]  albuterol (VENTOLIN HFA) 108 (90 Base) MCG/ACT inhaler Inhale 2 puffs into the lungs every 6 (six) hours as needed for wheezing or shortness of breath.   Yes [provider]  ALPRAZolam Duanne Moron) 0.5 MG tablet Take 0.5 mg by mouth 3 (three) times daily.    Yes [provider]  atorvastatin (LIPITOR) 10 MG tablet Take 10 mg by mouth at bedtime.    Yes [provider]  Carbidopa-Levodopa ER (SINEMET CR) 25-100 MG tablet controlled release Take 1.5 tablets  by mouth 3 (three) times daily. Patient taking differently: Take 1 tablet by mouth 3 (three) times daily.  06/14/18  Yes Kathrynn Ducking, MD  Cholecalciferol (VITAMIN D3) 5000 units CAPS Take 5,000 Units by mouth daily at 12 noon.    Yes [provider]  ciprofloxacin (CIPRO) 250 MG tablet Take 250 mg by mouth 2 (two) times daily.   Yes [provider]  clopidogrel (PLAVIX) 75 MG tablet Take 75 mg by mouth daily at 12 noon.    Yes [provider]  diltiazem (CARDIZEM CD) 120 MG 24 hr capsule Take 240 mg by mouth daily.   Yes [provider]  DULoxetine  (CYMBALTA) 30 MG capsule Take 30 mg by mouth daily.   Yes [provider]  losartan (COZAAR) 25 MG tablet Take 25 mg by mouth at bedtime.    Yes [provider]  Melatonin 3 MG CAPS Take 3 mg by mouth at bedtime.   Yes [provider]  Menthol, Topical Analgesic, (BIOFREEZE) 4 % GEL Apply 1 application topically 3 (three) times daily.   Yes [provider]  metFORMIN (GLUCOPHAGE) 500 MG tablet Take 500 mg by mouth 2 (two) times daily before a meal.    Yes [provider]  montelukast (SINGULAIR) 10 MG tablet Take 10 mg by mouth daily at 12 noon.    Yes [provider]  Multiple Vitamin (TAB-A-VITE) TABS Take 1 tablet by mouth daily at 12 noon.    Yes [provider]  oxyCODONE (OXY IR/ROXICODONE) 5 MG immediate release tablet Take 2.5 mg by mouth 2 (two) times daily.    Yes [provider]  oxymetazoline (AFRIN) 0.05 % nasal spray Place 1 spray into both nostrils as needed (for nose bleeds).   Yes [provider]  pantoprazole (PROTONIX) 40 MG tablet Take 40 mg by mouth daily before breakfast.    Yes [provider]  Travoprost, BAK Free, (TRAVATAN) 0.004 % SOLN ophthalmic solution Place 1 drop into both eyes at bedtime.   Yes [provider]  glucose blood test strip 1 each by Other route as needed for other ([place]). Use as instructed    [provider]    Family History Family History  Problem Relation Age of Onset  . Heart attack Mother   . Arthritis Brother   . Skin cancer Brother     Social History Social History   Tobacco Use  . Smoking status: Never Smoker  . Smokeless tobacco: Never Used  Substance Use Topics  . Alcohol use: No  . Drug use: No     Allergies   Comtan [entacapone]; Mirapex [pramipexole dihydrochloride]; Sulfa antibiotics; and Ciprofloxacin   Review of Systems Review of Systems  Unable to perform ROS:  Mental status change  Constitutional: Negative for chills and fever.  HENT: Negative for ear pain and sore throat.   Eyes: Negative for pain and visual disturbance.  Respiratory: Negative for cough and shortness of breath.   Cardiovascular: Negative for chest pain and palpitations.  Gastrointestinal: Negative for abdominal pain and vomiting.  Genitourinary: Positive for dysuria. Negative for hematuria.  Musculoskeletal: Negative for arthralgias and back pain.  Skin: Negative for color change and rash.  Neurological: Positive for weakness. Negative for seizures and syncope.  Psychiatric/Behavioral: Positive for confusion.  All other systems reviewed and are negative.    Physical Exam Updated Vital Signs  ED Triage Vitals  Enc Vitals Group  BP 06/17/18 1548 (!) 156/69     Pulse Rate 06/17/18 1548 90     Resp 06/17/18 1548 (!) 29     Temp 06/17/18 1548 99.1 F (37.3 C)     Temp Source 06/17/18 1548 Oral     SpO2 06/17/18 1548 97 %     Weight 06/17/18 1553 179 lb 14.3 oz (81.6 kg)     Height 06/17/18 1553 5' (1.524 m)     Head Circumference --      Peak Flow --      Pain Score --      Pain Loc --      Pain Edu? --      Excl. in [place]? --     Physical Exam  Constitutional: She appears well-nourished. She appears distressed.  HENT:  Head: Normocephalic and atraumatic.  Mouth/Throat: Oropharynx is clear and moist. No oropharyngeal exudate.  Eyes: Pupils are equal, round, and reactive to light. Conjunctivae and EOM are normal.  Neck: Neck supple.  Cardiovascular: Normal rate, regular rhythm, normal heart sounds and intact distal pulses.  No murmur heard. Pulmonary/Chest: Effort normal and breath sounds normal. No respiratory distress. She has no wheezes.  Abdominal: Soft. There is no tenderness. There is no guarding.  Musculoskeletal: Normal range of motion. She exhibits no edema.  Neurological: She is alert.  Skin: Skin is warm and dry. Capillary refill takes less than 2  seconds.  Psychiatric: She has a normal mood and affect.  Nursing note and vitals reviewed.    ED Treatments / Results  Labs (all labs ordered are listed, but only abnormal results are displayed) Labs Reviewed  COMPREHENSIVE METABOLIC PANEL - Abnormal; Notable for the following components:      Result Value   Glucose, Bld 136 (*)    BUN 29 (*)    Creatinine, Ser 1.01 (*)    GFR calc non Af Amer 49 (*)    GFR calc Af Amer 57 (*)    All other components within normal limits  CBC WITH DIFFERENTIAL/PLATELET - Abnormal; Notable for the following components:   WBC 13.7 (*)    Neutro Abs 10.7 (*)    All other components within normal limits  URINALYSIS, ROUTINE W REFLEX MICROSCOPIC - Abnormal; Notable for the following components:   Hgb urine dipstick SMALL (*)    Bacteria, UA MANY (*)    All other components within normal limits  I-STAT CG4 LACTIC ACID, ED - Abnormal; Notable for the following components:   Lactic Acid, Venous 3.43 (*)    All other components within normal limits  CULTURE, BLOOD (ROUTINE X 2)  CULTURE, BLOOD (ROUTINE X 2)  URINE CULTURE  PROTIME-INR  I-STAT CG4 LACTIC ACID, ED    EKG None  Radiology Dg Chest 2 View  Result Date: 06/17/2018 CLINICAL DATA:  Patient BIB per EMS from [facility]. Pt has had altered mental status for x 2 days and a concussion 1 week ago. Pt has had SOB on and off with increased lethargy and tachypnea per EMS. Hx of UTI-being treated with antibiotics.*comment was truncated* EXAM: CHEST - 2 VIEW COMPARISON:  08/12/2015 FINDINGS: Normal cardiac silhouette. No effusion, infiltrate pneumothorax. Mild central venous congestion. Remote RIGHT humerus fracture. Degenerative osteophytosis of the spine. IMPRESSION: No acute cardiopulmonary process. Electronically Signed   By: Suzy Bouchard M.D.   On: 06/17/2018 16:27    Procedures Procedures (including critical care time)  Medications Ordered in ED Medications  cefTRIAXone (ROCEPHIN)  1 g in  sodium chloride 0.9 % 100 mL IVPB (1 g Intravenous New Bag/Given 06/17/18 1727)  lactated ringers bolus 1,000 mL (1,000 mLs Intravenous New Bag/Given 06/17/18 1638)     Initial Impression / Assessment and Plan / ED Course  I have reviewed the triage vital signs and the nursing notes.  Pertinent labs & imaging results that were available during my care of the patient were reviewed by me and considered in my medical decision making (see chart for details).     TARIANA MOLDOVAN is an 84 year old female with history of high cholesterol, Parkinson's who presents to the ED with fever, UTI.  Patient with fever upon arrival.  Otherwise normal vitals.  Patient currently on ciprofloxacin for UTI at her facility.  Patient has had increasing lethargy, poor p.o. intake and continued fever despite oral antibiotics.  Patient continues to state that she has dysuria.  Denies any cough, sputum production, abdominal pain.  Patient with sepsis work-up initiated with urine cultures, blood culture, chest x-ray.  Patient given IV fluids given elevated lactic acid at 3.4.  Elevated white count.  Urinalysis shows no sign of infection.  Chest x-ray shows no signs of pneumonia.  However, suspect patient likely has ongoing UTI.  Confusion likely secondary to ciprofloxacin as well.  Patient with improvement of mentation following IV fluids.  Patient given dose of IV Rocephin for suspected UTI.  Possibly also viral process.  No signs to suggest meningitis.  Patient hemodynamically stable throughout my care.  No signs to suggest shock.  Patient admitted to medicine in good condition.  Final Clinical Impressions(s) / ED Diagnoses   Final diagnoses:  Sepsis, due to unspecified organism [facility])  Altered mental status, unspecified altered mental status type  Lower urinary tract infectious disease    ED Discharge Orders    None      Lennice Sites, DO 06/17/18 1747

## 2018-06-17 NOTE — ED Notes (Signed)
EDP [place] notified of 2.68 POC Lactic

## 2018-06-17 NOTE — ED Notes (Signed)
Bed: WA08 Expected date:  Expected time:  Means of arrival:  Comments: 84 yo Code Sepsis, recent UTI

## 2018-06-17 NOTE — Progress Notes (Signed)
Pt arrived from the ED @1915 , pts son at bedside, both were oriented to the room/unit. Orders to be reviewed

## 2018-06-17 NOTE — ED Notes (Signed)
EDP [place] notified of 3.43 Lactic POC

## 2018-06-17 NOTE — H&P (Signed)
History and Physical    [name] ZOX:[id number] DOB: 1930/04/23 DOA: 06/17/2018  PCP: Jacklyn Shell, FNP   Patient coming from: Assisted living facility.   Chief Complaint: Fever and altered mental status.   HPI: [name] is a 84 y.o. female with medical history significant of Parkinson's disease and ambulatory dysfunction, wheelchair-bound.  She was noted to have generalized weakness, malaise and poor appetite over the last 5 days.  She was diagnosed with urine tract infection 24 hours after symptoms started and received ciprofloxacin 48 hours after her symptoms started.  Despite antibiotic therapy she continued to deteriorate and today she was found febrile, dyspneic and ill looking appearing.  She was transferred to the hospital for further evaluation.  On direct questioning patient mentions experiencing worsening dysphagia for the last 5 days, persistent, associated with severe coughing and choking mainly postprandial, no improving or worsening factors.  Denies dyspnea but positive wheezing.  She has been in a wheelchair for last few years due to her Parkinson's disease.  In the outpatient clinic it was suggested that she has swallow dysfunction and a swallow evaluation was planned.   ED Course: Patient was found febrile and altered, improved with IV fluids.  Refer for admission for evaluation.  Review of Systems:  1. General: Positive fevers, no chills, no weight gain or weight loss 2. ENT: No runny nose or sore throat, no hearing disturbances 3. Pulmonary: No dyspnea, but positive cough and wheezing. 4. Cardiovascular: No angina, claudication, lower extremity edema, pnd or orthopnea 5. Gastrointestinal: No nausea or vomiting, no diarrhea or constipation/ positive persistent reflux. 6. Hematology: No easy bruisability or frequent infections 7. Urology: Intermittent dysuria and incontinence but not hematuria or increased urinary frequency 8. Dermatology: No  rashes. 9. Neurology: No seizures or paresthesias 10. Musculoskeletal: No joint pain or deformities  Past Medical History:  Diagnosis Date  . Abnormality of gait 02/05/2015  . Anxiety   . Arthritis   . Asthma   . Cerebrovascular disease    CVA '03  . Depression   . Dyslipidemia   . Endometriosis   . GERD (gastroesophageal reflux disease)   . Glaucoma   . Headache(784.0)   . Hypertension   . Macular degeneration    bilateral  . Obesity   . Paralysis agitans ([place]) 08/31/2013  . Parkinson disease ([place])   . TIA (transient ischemic attack)    Multiple    Past Surgical History:  Procedure Laterality Date  . ABDOMINAL HYSTERECTOMY    . CATARACT EXTRACTION, BILATERAL    . GALLBLADDER SURGERY    . LOOP RECORDER IMPLANT  05-28-14   MDT LINQ implanted by Dr Caryl Comes for cryptogenic stroke/palpitations  . LOOP RECORDER IMPLANT N/A 05/28/2014   Procedure: LOOP RECORDER IMPLANT;  Surgeon: Deboraha Sprang, MD;  Location: [facility] C[facility] LAB;  Service: Cardiovascular;  Laterality: N/A;  . PILONIDAL CYST RESECTION    . TILT TABLE STUDY  05-28-14   orthostatic hypertension  . TILT TABLE STUDY N/A 05/28/2014   Procedure: TILT TABLE STUDY;  Surgeon: Deboraha Sprang, MD;  Location: [facility] CATH LAB;  Service: Cardiovascular;  Laterality: N/A;  . TONSILLECTOMY       reports that she has never smoked. She has never used smokeless tobacco. She reports that she does not drink alcohol or use drugs.  Allergies  Allergen Reactions  . Comtan [Entacapone] Other (See Comments)    Reaction:  Unknown   . Mirapex [Pramipexole Dihydrochloride] Other (See Comments)  Reaction:  Unknown   . Sulfa Antibiotics Nausea And Vomiting  . Ciprofloxacin Other (See Comments)    Reaction:  Made pt feel crazy     Family History  Problem Relation Age of Onset  . Heart attack Mother   . Arthritis Brother   . Skin cancer Brother      Prior to Admission medications   Medication Sig Start Date End Date Taking? Authorizing  Provider  Acetaminophen 500 MG coapsule Take 500 mg by mouth 3 (three) times daily.    Yes [provider]  albuterol (VENTOLIN HFA) 108 (90 Base) MCG/ACT inhaler Inhale 2 puffs into the lungs every 6 (six) hours as needed for wheezing or shortness of breath.   Yes [provider]  ALPRAZolam Duanne Moron) 0.5 MG tablet Take 0.5 mg by mouth 3 (three) times daily.    Yes [provider]  atorvastatin (LIPITOR) 10 MG tablet Take 10 mg by mouth at bedtime.    Yes [provider]  Carbidopa-Levodopa ER (SINEMET CR) 25-100 MG tablet controlled release Take 1.5 tablets by mouth 3 (three) times daily. Patient taking differently: Take 1 tablet by mouth 3 (three) times daily.  06/14/18  Yes Kathrynn Ducking, MD  Cholecalciferol (VITAMIN D3) 5000 units CAPS Take 5,000 Units by mouth daily at 12 noon.    Yes [provider]  ciprofloxacin (CIPRO) 250 MG tablet Take 250 mg by mouth 2 (two) times daily.   Yes [provider]  clopidogrel (PLAVIX) 75 MG tablet Take 75 mg by mouth daily at 12 noon.    Yes [provider]  diltiazem (CARDIZEM CD) 120 MG 24 hr capsule Take 240 mg by mouth daily.   Yes [provider]  DULoxetine (CYMBALTA) 30 MG capsule Take 30 mg by mouth daily.   Yes [provider]  losartan (COZAAR) 25 MG tablet Take 25 mg by mouth at bedtime.    Yes [provider]  Melatonin 3 MG CAPS Take 3 mg by mouth at bedtime.   Yes [provider]  Menthol, Topical Analgesic, (BIOFREEZE) 4 % GEL Apply 1 application topically 3 (three) times daily.   Yes [provider]  metFORMIN (GLUCOPHAGE) 500 MG tablet Take 500 mg by mouth 2 (two) times daily before a meal.    Yes [provider]  montelukast (SINGULAIR) 10 MG tablet Take 10 mg by mouth daily at 12 noon.    Yes [provider]  Multiple Vitamin (TAB-A-VITE) TABS Take 1 tablet by mouth daily at 12 noon.    Yes [provider]  oxyCODONE (OXY IR/ROXICODONE) 5 MG immediate release tablet Take 2.5 mg by mouth 2 (two) times daily.    Yes [provider]  oxymetazoline (AFRIN) 0.05 % nasal spray Place 1 spray into both nostrils as needed (for nose bleeds).   Yes [provider]  pantoprazole (PROTONIX) 40 MG tablet Take 40 mg by mouth daily before breakfast.    Yes [provider]  Travoprost, BAK Free, (TRAVATAN) 0.004 % SOLN ophthalmic solution Place 1 drop into both eyes at bedtime.   Yes [provider]  glucose blood test strip 1 each by Other route as needed for other ([place]). Use as instructed    [provider]    Physical Exam: Vitals:   06/17/18 1553 06/17/18 1630 06/17/18 1637 06/17/18 1700  BP:  (!) 142/64  (!) 151/66  Pulse:  88  85  Resp:  (!)  29  (!) 28  Temp:   (!) 100.5 F (38.1 C)   TempSrc:   Rectal   SpO2:  98%  99%  Weight: 81.6 kg     Height: 5' (1.524 m)       Constitutional: deconditioned and ill looking appearing Vitals:   06/17/18 1553 06/17/18 1630 06/17/18 1637 06/17/18 1700  BP:  (!) 142/64  (!) 151/66  Pulse:  88  85  Resp:  (!) 29  (!) 28  Temp:   (!) 100.5 F (38.1 C)   TempSrc:   Rectal   SpO2:  98%  99%  Weight: 81.6 kg     Height: 5' (1.524 m)      Eyes: PERRL, lids and conjunctivae with pallor Head normocephalic, nose and ears with no deformities ENMT: Mucous membranes are dry. Posterior pharynx clear of any exudate or lesions.Normal dentition.  Neck: normal, supple, no masses, no thyromegaly Respiratory: Mild expiratory wheezing, with scattered  crackles. Normal respiratory effort. No accessory muscle use.  Cardiovascular: Regular rate and rhythm, no murmurs / rubs / gallops. Trace extremity edema. 2+ pedal pulses. No carotid bruits.  Abdomen: no tenderness, no masses palpated. No hepatosplenomegaly. Bowel sounds positive.  Musculoskeletal: no clubbing / cyanosis. No joint  deformity upper and lower extremities. Good ROM, no contractures. Normal muscle tone.  Skin: no rashes, lesions, ulcers. No induration Neurologic: CN 2-12 grossly intact. Sensation intact, DTR normal. Strength 5/5 in all 4. Noted rigidity, no resting tremors.     Labs on Admission: I have personally reviewed following labs and imaging studies  CBC: Recent Labs  Lab 06/17/18 1554  WBC 13.7*  NEUTROABS 10.7*  HGB 12.9  HCT 39.9  MCV 93.0  PLT 448   Basic Metabolic Panel: Recent Labs  Lab 06/17/18 1554  NA 139  K 4.0  CL 102  CO2 22  GLUCOSE 136*  BUN 29*  CREATININE 1.01*  CALCIUM 10.0   GFR: Estimated Creatinine Clearance: 37.8 mL/min (A) (by C-G formula based on SCr of 1.01 mg/dL (H)). Liver Function Tests: Recent Labs  Lab 06/17/18 1554  AST 34  ALT 26  ALKPHOS 71  BILITOT 0.8  PROT 7.4  ALBUMIN 3.9   No results for input(s): LIPASE, AMYLASE in the last 168 hours. No results for input(s): AMMONIA in the last 168 hours. Coagulation Profile: Recent Labs  Lab 06/17/18 1554  INR 1.06   Cardiac Enzymes: No results for input(s): CKTOTAL, CKMB, CKMBINDEX, TROPONINI in the last 168 hours. BNP (last 3 results) No results for input(s): PROBNP in the last 8760 hours. HbA1C: No results for input(s): HGBA1C in the last 72 hours. CBG: No results for input(s): GLUCAP in the last 168 hours. Lipid Profile: No results for input(s): CHOL, HDL, LDLCALC, TRIG, CHOLHDL, LDLDIRECT in the last 72 hours. Thyroid Function Tests: No results for input(s): TSH, T4TOTAL, FREET4, T3FREE, THYROIDAB in the last 72 hours. Anemia Panel: No results for input(s): VITAMINB12, FOLATE, FERRITIN, TIBC, IRON, RETICCTPCT in the last 72 hours. Urine analysis:    Component Value Date/Time   COLORURINE YELLOW 06/17/2018 [place] 06/17/2018 1554   LABSPEC 1.016 06/17/2018 1554   PHURINE 5.0 06/17/2018 1554   GLUCOSEU NEGATIVE 06/17/2018 1554   HGBUR SMALL (A) 06/17/2018  1554   BILIRUBINUR NEGATIVE 06/17/2018 1554   KETONESUR NEGATIVE 06/17/2018 1554   PROTEINUR NEGATIVE 06/17/2018 1554   UROBILINOGEN 0.2 08/12/2015 2047   NITRITE NEGATIVE 06/17/2018 1554   LEUKOCYTESUR NEGATIVE 06/17/2018 1554  Radiological Exams on Admission: Dg Chest 2 View  Result Date: 06/17/2018 CLINICAL DATA:  Patient BIB per EMS from [facility]. Pt has had altered mental status for x 2 days and a concussion 1 week ago. Pt has had SOB on and off with increased lethargy and tachypnea per EMS. Hx of UTI-being treated with antibiotics.*comment was truncated* EXAM: CHEST - 2 VIEW COMPARISON:  08/12/2015 FINDINGS: Normal cardiac silhouette. No effusion, infiltrate pneumothorax. Mild central venous congestion. Remote RIGHT humerus fracture. Degenerative osteophytosis of the spine. IMPRESSION: No acute cardiopulmonary process. Electronically Signed   By: Suzy Bouchard M.D.   On: 06/17/2018 16:27    EKG: Independently reviewed.  EKG with sinus rhythm, first-degree AV block, right axis deviation, positive artifact.  Assessment/Plan Active Problems:   Metabolic encephalopathy  84 year old female with advanced Parkinson's disease who presents with altered mentation and fever, symptoms were persistent despite antibiotic therapy with ciprofloxacin.  She does mention worsening dysphasia with symptoms of aspiration, for the last 5 days.  On the initial physical examination her rectal temperature was 100.5, blood pressure 142/64, heart rate 88, respiratory rate 29, oxygen saturation 98%.  Have expiratory wheezing and scattered rales, heart S1-S2 present and rhythmic, abdomen soft nontender, trace lower extremity edema.  She does have rigidity but otherwise nonfocal, she is awake, alert and oriented.  Sodium 139, potassium 4.0, chloride 102, bicarb 22, glucose 136, 1029, creatinine 1.0, venous lactic acid 3.4, white count 13.7, hemoglobin 12.9, hematocrit 39.9, platelets 315.  Urine analysis  specific gravity 1.016, 0-5 white cells, 0-5 red cells. Chest X-ray with increased lung markings bilaterally, no infiltrates.  Patient will be admitted to the hospital with the working diagnosis of metabolic encephalopathy due to aspiration pneumonitis, to rule out pneumonia with sepsis.   1.  Aspiration pneumonitis/pneumonia to rule out sepsis.  Patient will be admitted to the medical ward, she will be placed on IV isotonic saline and dextrose at 75 ml per hour, will continue IV antibiotic therapy with intravenous ceftriaxone.  Considering patient's advanced Parkinson's disease she does have a high pretest probability for aspiration.  Will follow-up chest film (2 view) in the morning and will do a formal swallow evaluation at the bedside.  Follow-up on blood cultures, cell count and temperature curve.  Will add bronchodilator therapy with DuoNeb's every 6 hours and as needed every 2 hours.   2.  Advanced Parkinson's disease with ambulatory dysfunction.  Continue Sinemet 3 times daily, physical therapy evaluation and aspiration precautions.  Continue duloxetine, alprazolam and melatonin.  3.  Hypertension.  Continue blood pressure controlled with diltiazem 240 mg daily, and losartan 25 mg bedtime.  4.  Dyslipidemia.  Atorvastatin.  5.  History of CVA.  Continue atorvastatin, blood pressure control and clopidogrel.   DVT prophylaxis: enoxaparin  Code Status:  full  Family Communication: I spoke with patient's family at the bedside and all questions were addressed.   Disposition Plan:  Med-surg   Consults called: none  Admission status: Inpatient    Koven Belinsky Gerome Apley MD [facility] Pager [phone]  If 7PM-7AM, please contact night-coverage [email or web address] Password [facility]  06/17/2018, 5:24 PM

## 2018-06-17 NOTE — ED Notes (Signed)
hospitalist at bedside

## 2018-06-17 NOTE — ED Notes (Signed)
Report called to RN on 5E. Patient VSS. Patient to be transported to the floor via Risk manager. Patient and family updated on POC

## 2018-06-17 NOTE — ED Triage Notes (Signed)
Patient BIB per EMS from [facility]. Pt has had altered mental status for x 2 days. Pt. Has increased lethargy and tachypnea per EMS. Hx of UTI-being treated with antibiotics. Pt is afebrile. Code sepsis per EMS. 22 gauge IV rt wrist. 250 mL NS given per EMS. Hx of right humeral fracture unhealed. CBG= 125.

## 2018-06-18 ENCOUNTER — Inpatient Hospital Stay (HOSPITAL_COMMUNITY): Payer: Medicare Other

## 2018-06-18 DIAGNOSIS — R4182 Altered mental status, unspecified: Secondary | ICD-10-CM

## 2018-06-18 DIAGNOSIS — A419 Sepsis, unspecified organism: Secondary | ICD-10-CM

## 2018-06-18 LAB — BASIC METABOLIC PANEL
Anion gap: 7 (ref 5–15)
BUN: 21 mg/dL (ref 8–23)
CHLORIDE: 103 mmol/L (ref 98–111)
CO2: 30 mmol/L (ref 22–32)
CREATININE: 0.74 mg/dL (ref 0.44–1.00)
Calcium: 9.9 mg/dL (ref 8.9–10.3)
GFR calc Af Amer: >60 mL/min (ref >60)
Glucose, Bld: 114 mg/dL — ABNORMAL HIGH (ref 70–99)
POTASSIUM: 3.1 mmol/L — AB (ref 3.5–5.1)
SODIUM: 140 mmol/L (ref 135–145)

## 2018-06-18 LAB — CBC
HCT: 34.9 % — ABNORMAL LOW (ref 36.0–46.0)
Hemoglobin: 11.2 g/dL — ABNORMAL LOW (ref 12.0–15.0)
MCH: 29.6 pg (ref 26.0–34.0)
MCHC: 32.1 g/dL (ref 30.0–36.0)
MCV: 92.1 fL (ref 78.0–100.0)
PLATELETS: 314 10*3/uL (ref 150–400)
RBC: 3.79 MIL/uL — AB (ref 3.87–5.11)
RDW: 13.9 % (ref 11.5–15.5)
WBC: 10 10*3/uL (ref 4.0–10.5)

## 2018-06-18 LAB — PROCALCITONIN: PROCALCITONIN: 0.12 ng/mL

## 2018-06-18 LAB — MRSA PCR SCREENING: MRSA by PCR: NEGATIVE

## 2018-06-18 IMAGING — CR DG KNEE COMPLETE 4+V*R*
4 series · 4 of 4 positions shown · non-contrast
Comparison: None.

CLINICAL DATA: Fall this morning with right anterior knee pain.

EXAM:
RIGHT KNEE - COMPLETE 4+ VIEW

[x knee ap right (1 of 3)]
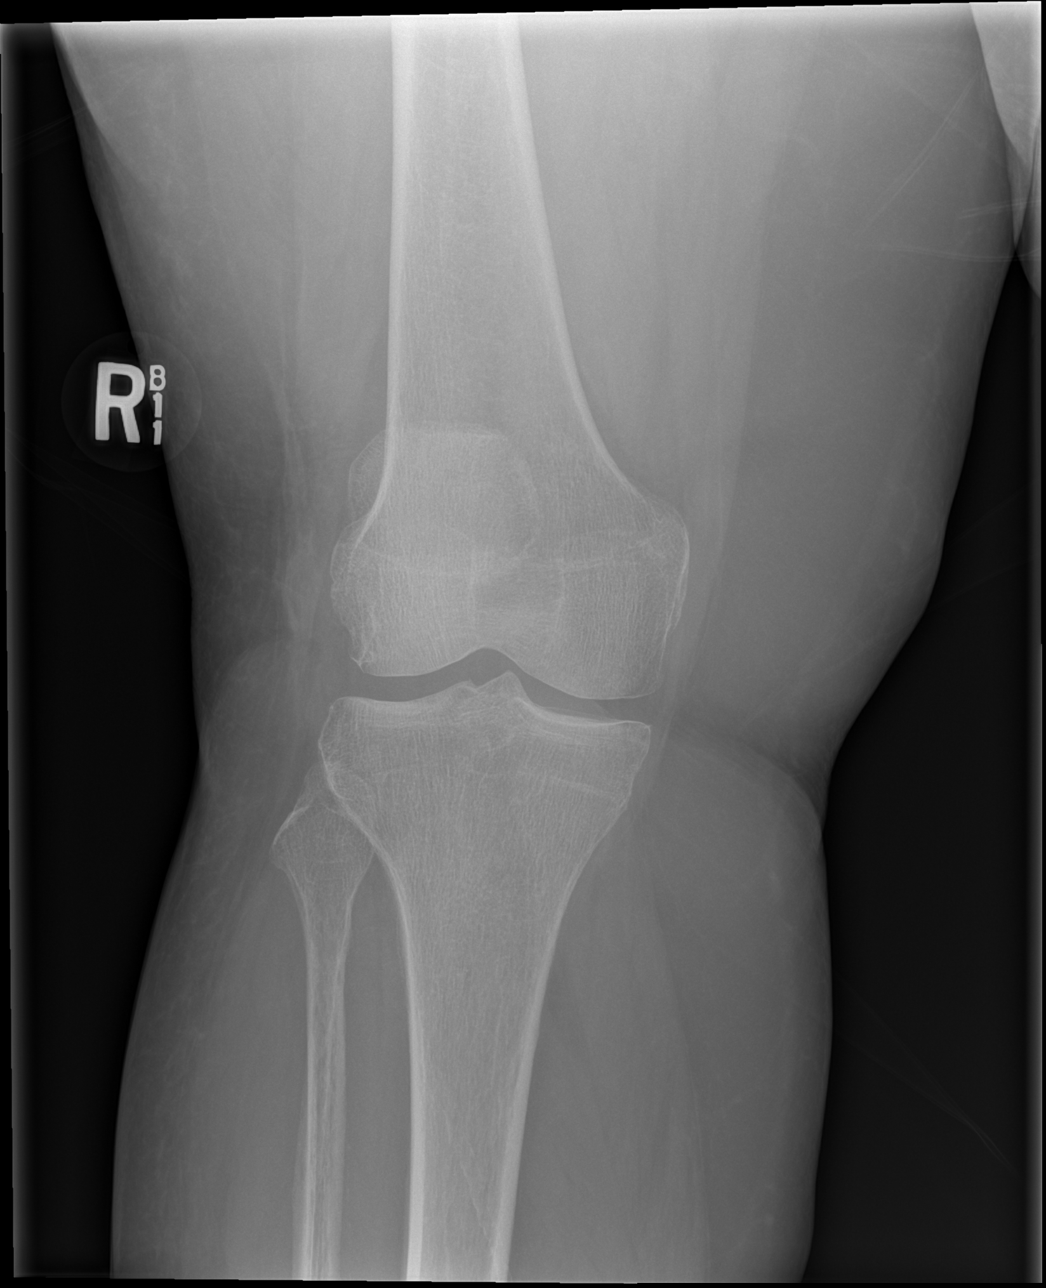

[x knee ap right (2 of 3)]
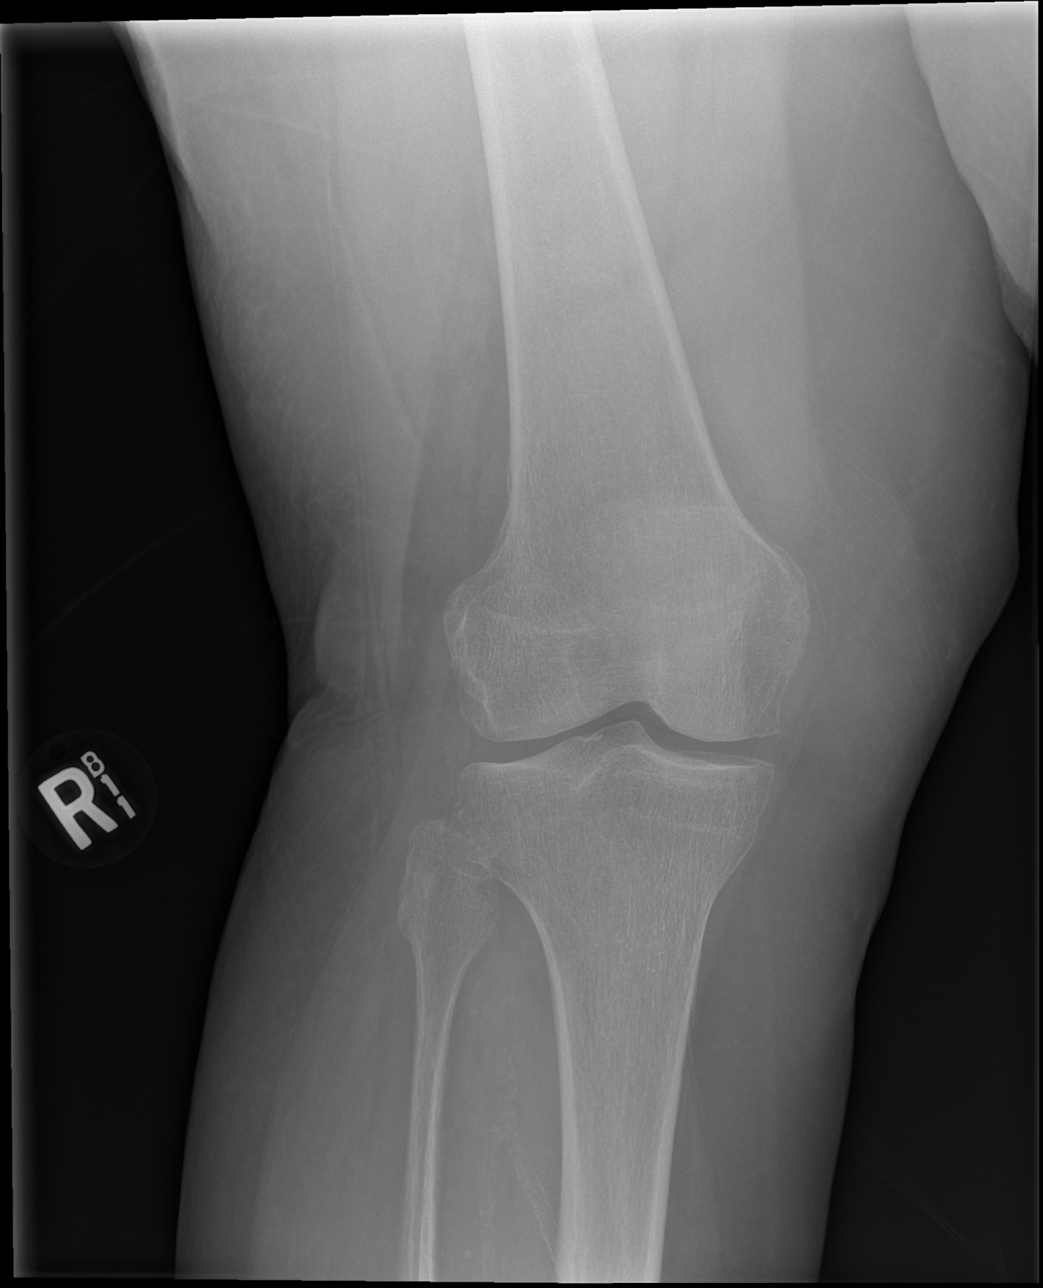

[x knee ap right (3 of 3)]
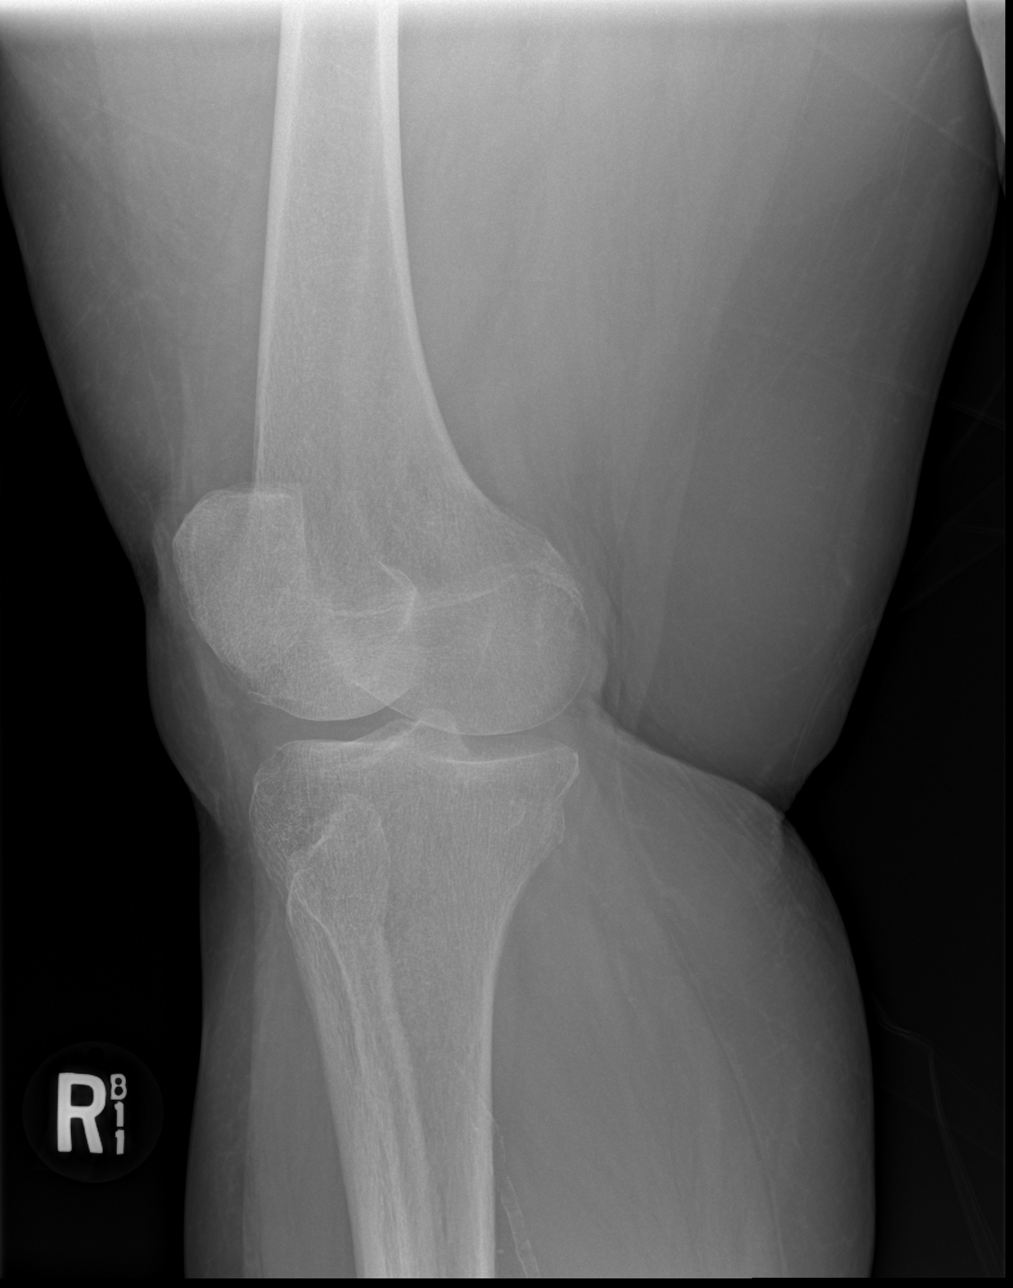

[x knee lat right]
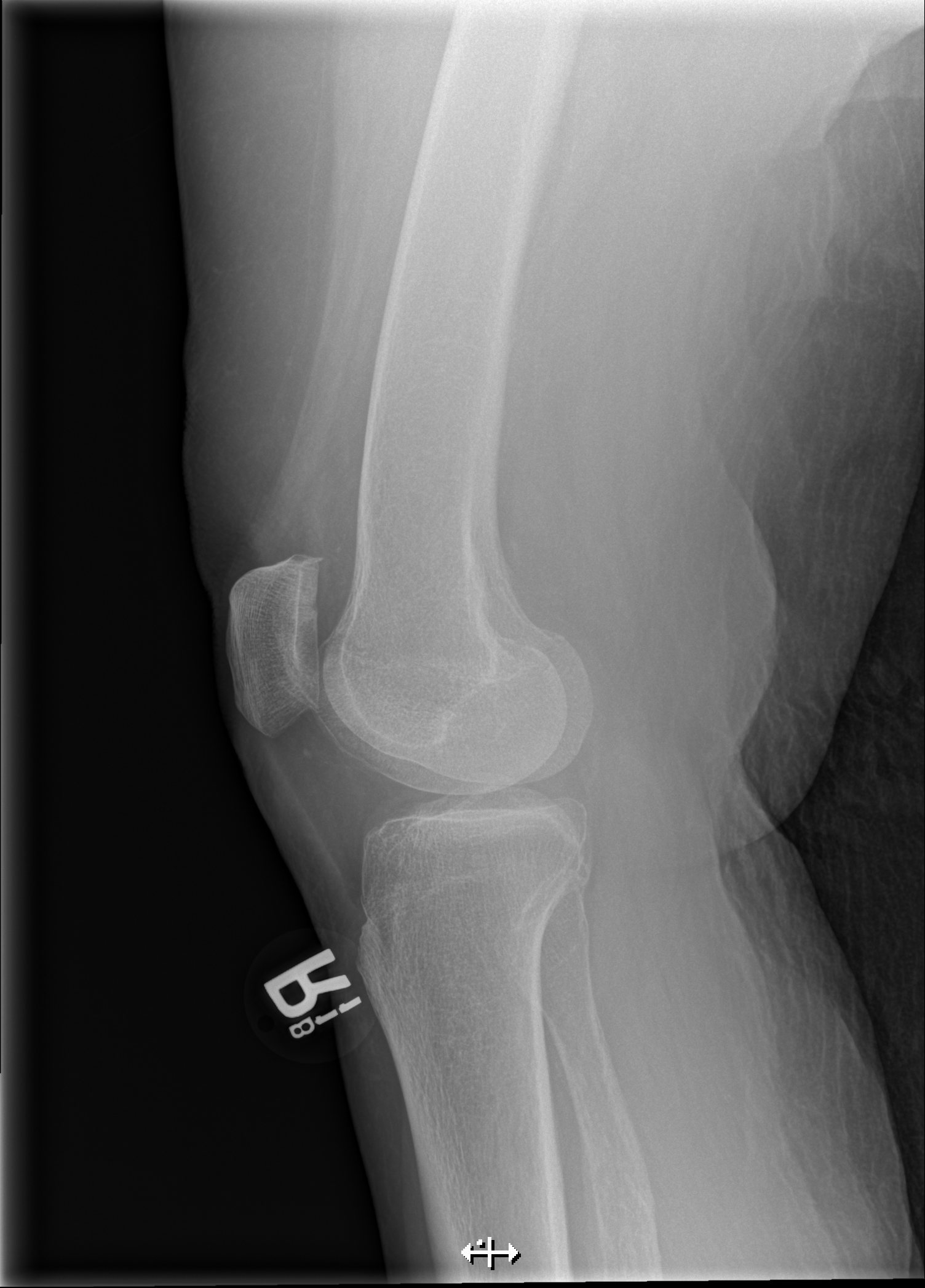

[4 of 4 positions shown; findings below may reference images not displayed]

FINDINGS: Mild degenerate change of the patellofemoral joint. No evidence of
fracture or dislocation. No definite joint effusion.
IMPRESSION: No acute findings.

## 2018-06-18 IMAGING — CR DG WRIST COMPLETE 3+V*R*
4 series · 4 of 4 positions shown · non-contrast
Comparison: November 07, 2015

CLINICAL DATA: Pain following fall

EXAM:
RIGHT WRIST - COMPLETE 3+ VIEW

[x wrist pa right]
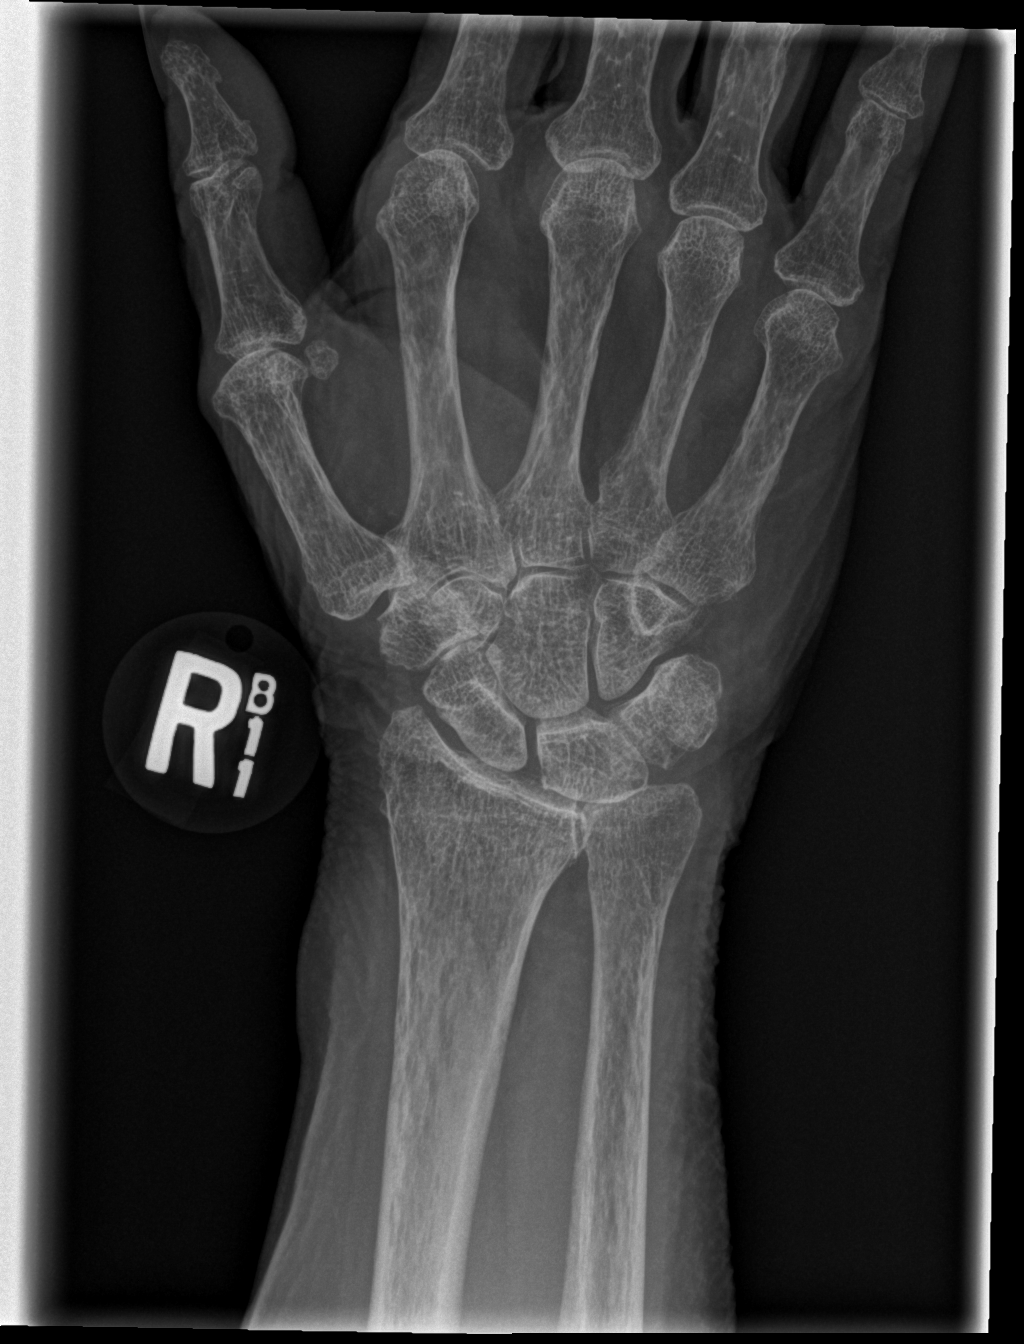

[x wrist obl right]
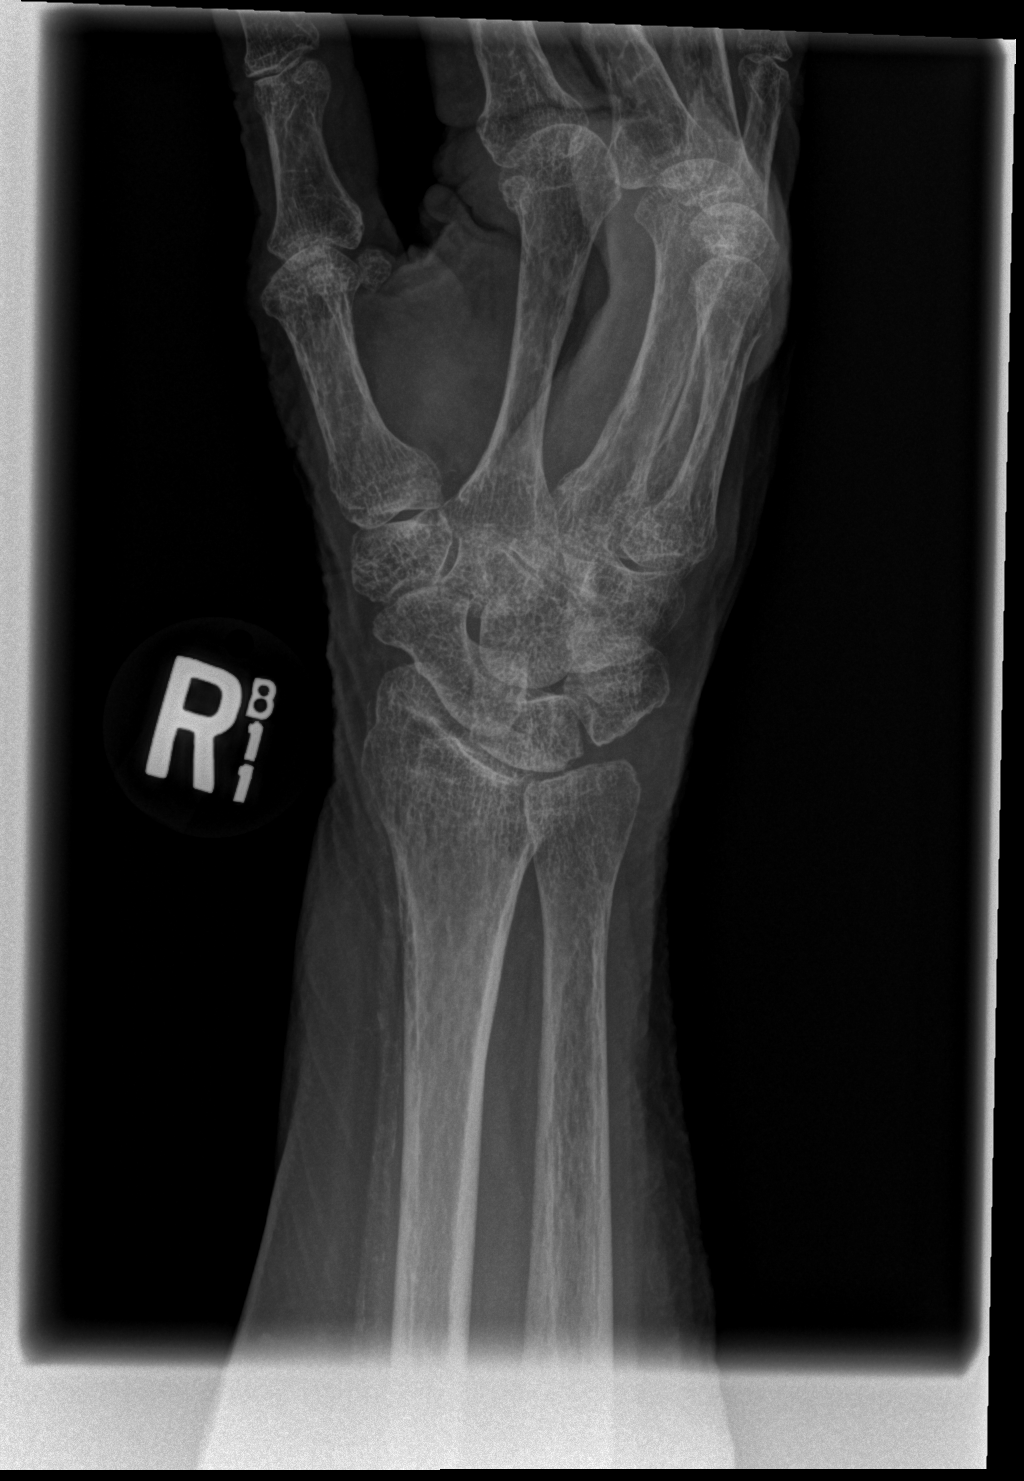

[x wrist lat right]
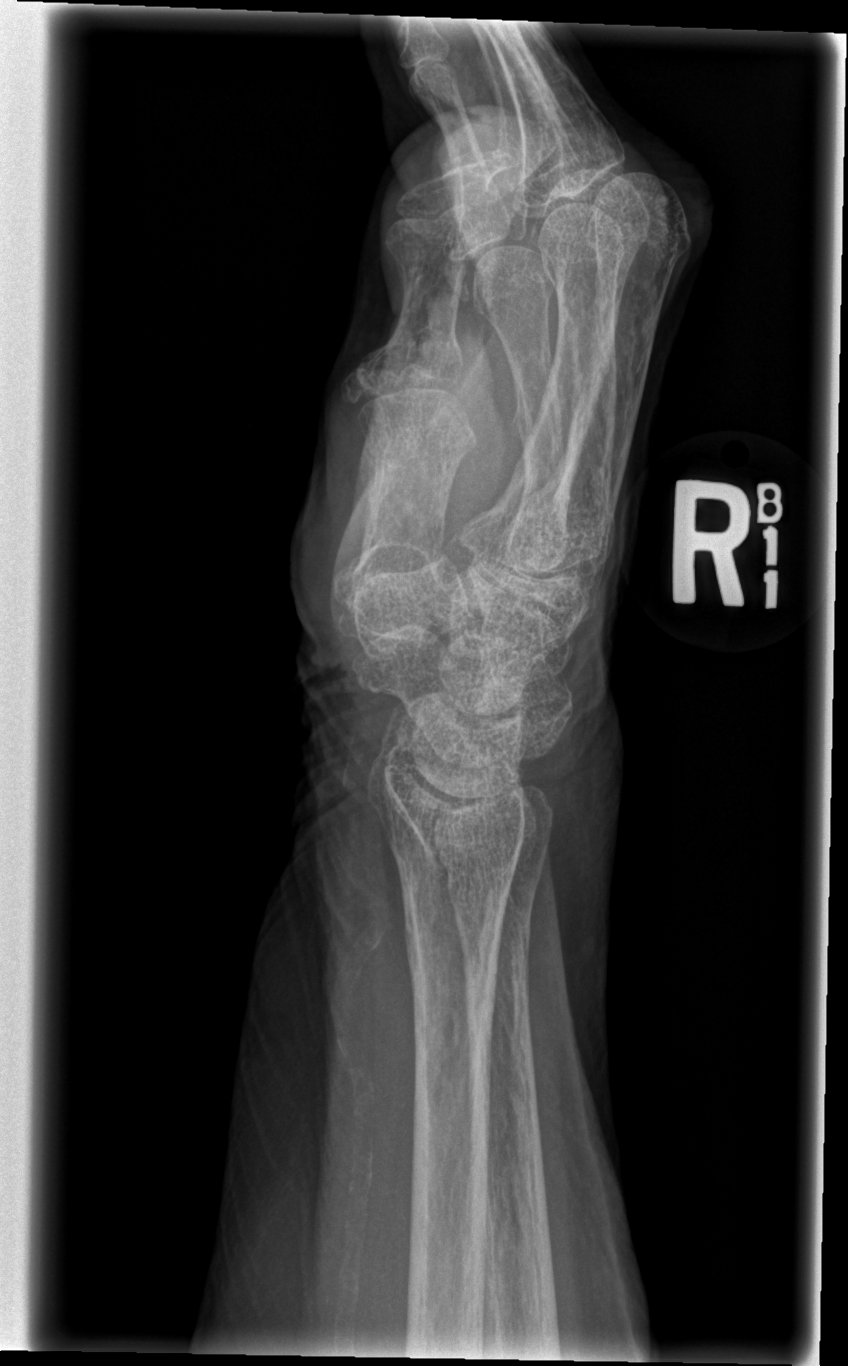

[x wrist navicular view right]
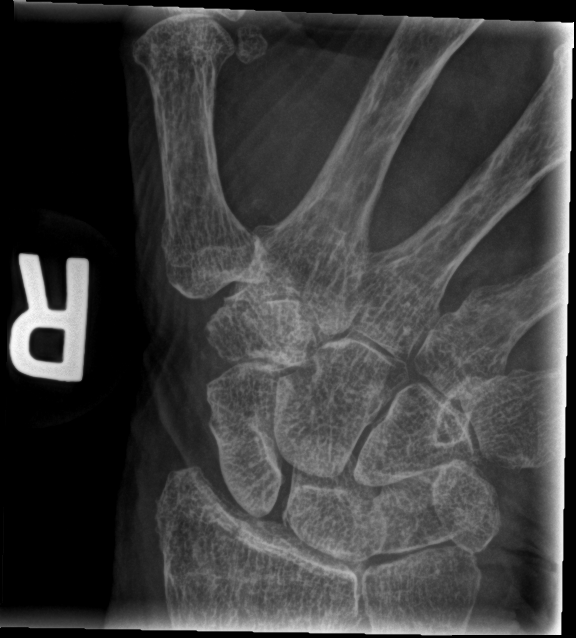

[4 of 4 positions shown; findings below may reference images not displayed]

FINDINGS: Frontal, oblique lateral, and ulnar deviation scaphoid images were
obtained. Bones are diffusely osteoporotic. No acute fracture or
dislocation is evident. There is osteoarthritic change in the
radiocarpal joint as well as in the scaphotrapezial joint. There is
also mild narrowing of all MCP joints. No erosive change.
IMPRESSION: Diffuse osteoporosis. Areas of osteoarthritic change. No acute
fracture or dislocation.

## 2018-06-18 MED ORDER — POTASSIUM CHLORIDE 20 MEQ PO PACK
40.0000 meq | PACK | ORAL | Status: AC
  Administered 2018-06-18 (×2): 40 meq via ORAL
  Filled 2018-06-18 (×2): qty 2

## 2018-06-18 MED ORDER — IPRATROPIUM-ALBUTEROL 0.5-2.5 (3) MG/3ML IN SOLN
3.0000 mL | Freq: Two times a day (BID) | RESPIRATORY_TRACT | Status: DC
  Administered 2018-06-18 – 2018-06-22 (×9): 3 mL via RESPIRATORY_TRACT
  Filled 2018-06-18 (×9): qty 3

## 2018-06-18 NOTE — Evaluation (Signed)
Clinical/Bedside Swallow Evaluation Patient Details  Name: [name] MRN: [id number] Date of Birth: [birth year]  Today's Date: 06/18/2018 Time: SLP Start Time (ACUTE ONLY): 0930 SLP Stop Time (ACUTE ONLY): 0956 SLP Time Calculation (min) (ACUTE ONLY): 26 min  Past Medical History:  Past Medical History:  Diagnosis Date  . Abnormality of gait 02/05/2015  . Anxiety   . Arthritis   . Asthma   . Cerebrovascular disease    CVA '03  . Depression   . Dyslipidemia   . Endometriosis   . GERD (gastroesophageal reflux disease)   . Glaucoma   . Headache(784.0)   . Hypertension   . Macular degeneration    bilateral  . Obesity   . Paralysis agitans ([place]) 08/31/2013  . Parkinson disease ([place])   . TIA (transient ischemic attack)    Multiple   Past Surgical History:  Past Surgical History:  Procedure Laterality Date  . ABDOMINAL HYSTERECTOMY    . CATARACT EXTRACTION, BILATERAL    . GALLBLADDER SURGERY    . LOOP RECORDER IMPLANT  05-28-14   MDT LINQ implanted by Dr Caryl Comes for cryptogenic stroke/palpitations  . LOOP RECORDER IMPLANT N/A 05/28/2014   Procedure: LOOP RECORDER IMPLANT;  Surgeon: Deboraha Sprang, MD;  Location: [facility] CATH LAB;  Service: Cardiovascular;  Laterality: N/A;  . PILONIDAL CYST RESECTION    . TILT TABLE STUDY  05-28-14   orthostatic hypertension  . TILT TABLE STUDY N/A 05/28/2014   Procedure: TILT TABLE STUDY;  Surgeon: Deboraha Sprang, MD;  Location: [facility] CATH LAB;  Service: Cardiovascular;  Laterality: N/A;  . TONSILLECTOMY     HPI:  Jasmine Cobbs Robertsonis a 84 y.o.femalewith medical history significant ofParkinson's disease and ambulatory dysfunction, wheelchair-bound. She was noted to have generalized weakness, malaise and poor appetite over the last 5 days. She was diagnosed with urine tract infection 24 hours after symptoms started and received ciprofloxacin 48 hours after her symptoms started.Despite antibiotic therapy she continued to deteriorate  andtoday she was found febrile, dyspneic and ill looking appearing. She was transferred to the hospital for furtherevaluation. On direct questioning patient mentions experiencing worsening dysphagia for the last 5 days, persistent, associated with severe coughing and choking mainly postprandial,no improving or worsening factors. Denies dyspnea but positive wheezing. She has been in a wheelchair for last few years due to her Parkinson's disease. In the outpatient clinic itwas suggested that she hasswallow dysfunction and a swallow evaluation was planned.  Most recent CXR is showing progression of bibasilar airspace disease.     Assessment / Plan / Recommendation Clinical Impression  Clinical swallowing evaluation was completed using thin liquids, pureed material and dry solids in setting for admission due to fevers and AMS.  She has a history of Parkinson's disease and does endorse trouble swallowing that she is unable to really elaborate on.  The patient was found to have a possible aspiration PNA per H&P.  Most recent CXR is showing progression of bibasilar airspace disease.  Oral mechanism exam was completed and remarkable for decreased lingual elevation and mildly reduced lingual strength.  The patient presented with a possible oropharyngeal dysphagia.  The oral phase was marked by slow oral movement of the bolus.  The pharyngeal phase was marked by multiple swallows across all bolus textures and a possible delay in the swallow trigger.  Belching was seen and the patient does endorse some symptoms that suggest an esophageal dysphagia.  Delayed cough was seen well after all presentations.  It was difficult to  tell if related to POs.  Given this as well as concern for aspiration PNA recommend MBS to determine current swallowing physiology and least restrictive diet.   SLP Visit Diagnosis: Dysphagia, unspecified (R13.10)    Aspiration Risk   Mild-moderate    Diet Recommendation   Regular/thin  pending results of MBS.  Medication Administration: Whole meds with puree    Other  Recommendations Oral Care Recommendations: Oral care BID   Follow up Recommendations Other (comment)(TBD)        Swallow Study   General Date of Onset: 06/17/18 HPI: Jasmine Canto Robertsonis a 84 y.o.femalewith medical history significant ofParkinson's disease and ambulatory dysfunction, wheelchair-bound. She was noted to have generalized weakness, malaise and poor appetite over the last 5 days. She was diagnosed with urine tract infection 24 hours after symptoms started and received ciprofloxacin 48 hours after her symptoms started.Despite antibiotic therapy she continued to deteriorate andtoday she was found febrile, dyspneic and ill looking appearing. She was transferred to the hospital for furtherevaluation. On direct questioning patient mentions experiencing worsening dysphagia for the last 5 days, persistent, associated with severe coughing and choking mainly postprandial,no improving or worsening factors. Denies dyspnea but positive wheezing. She has been in a wheelchair for last few years due to her Parkinson's disease. In the outpatient clinic itwas suggested that she hasswallow dysfunction and a swallow evaluation was planned.  Most recent CXR is showing progression of bibasilar airspace disease.   Type of Study: Bedside Swallow Evaluation Previous Swallow Assessment: None noted at [facility] Diet Prior to this Study: Regular;Thin liquids Temperature Spikes Noted: Yes History of Recent Intubation: No Behavior/Cognition: Alert;Cooperative;Pleasant mood Oral Cavity Assessment: Within Functional Limits Oral Care Completed by SLP: No Vision: Functional for self-feeding Self-Feeding Abilities: Able to feed self Patient Positioning: Upright in bed Baseline Vocal Quality: Normal Volitional Cough: Strong Volitional Swallow: Able to elicit    Oral/Motor/Sensory Function Overall Oral Motor/Sensory  Function: Within functional limits   Ice Chips Ice chips: Not tested   Thin Liquid Thin Liquid: Impaired Presentation: Cup;Spoon;Straw Pharyngeal  Phase Impairments: Multiple swallows    Nectar Thick Nectar Thick Liquid: Not tested   Honey Thick Honey Thick Liquid: Not tested   Puree Puree: Impaired Presentation: Spoon Pharyngeal Phase Impairments: Multiple swallows   Solid     Solid: Impaired Presentation: Self Fed Oral Phase Impairments: Impaired mastication Oral Phase Functional Implications: Prolonged oral transit Pharyngeal Phase Impairments: Multiple swallows     Shelly Flatten, MA, CCC-SLP Acute Rehab SLP [phone] Lamar Sprinkles 06/18/2018,10:04 AM

## 2018-06-18 NOTE — Progress Notes (Signed)
Modified Barium Swallow Progress Note  Patient Details  Name: TAKIYAH BOHNSACK MRN: [id number] Date of Birth: [birth year]  Today's Date: 06/18/2018  Modified Barium Swallow completed.  Full report located under Chart Review in the Imaging Section.  Brief recommendations include the following:  Clinical Impression  MBS was completed using thin liquids, nectar thick liquids, pureed material and dry solids.  The patient presented with a mild  oropharyngeal dysphagia.  The oral phase was marked by decreased lingual motion that led to delayed oral transit and oral residue.  She generally displayed good oral control of the bolus but intermittently had some issues with material falling into the buccal area.  The pharyngeal phase was marked by decreased hyo-laryngeal elevation and anterior movement, decreased laryngeal vestibule closure, mildly reduced base of tongue retraction and prominence of the cricopharyngeus.  She had intermittent mild residue at the vallecula.  Penetration into the laryngeal vestibule was seen prior to the swallow given self fed cup sips of thin liquids.   Cued cough and reswallow were successful to clear material.  Chin tuck was also successful to prevent.  Esophageal sweep did not reveal overt issues.  Recommend a regular diet with thin liquids.  Patient should tuck her chin with liquids.  Use of a straw facilitated use of chin tuck.  If patient is unable to use a chin tuck recommend using nectar thick liquids.  ST will follow for therapeutic diet tolerance and to initiate swallowing therapy.  She would benefit from ST follow up at next level of care.     Swallow Evaluation Recommendations       SLP Diet Recommendations: Regular solids;Thin liquid   Liquid Administration via: Straw(using chin tuck)   Medication Administration: Crushed with puree   Supervision: Patient able to self feed;Staff to assist with self feeding   Compensations: Slow rate;Small sips/bites;Chin  tuck   Postural Changes: Remain semi-upright after after feeds/meals (Comment)   Oral Care Recommendations: Oral care BID       Shelly Flatten, MA, CCC-SLP Acute Rehab SLP [phone]  Lamar Sprinkles 06/18/2018,12:32 PM

## 2018-06-18 NOTE — Progress Notes (Signed)
PROGRESS NOTE    [name]  AJO:[id number] DOB: Aug 15, 1930 DOA: 06/17/2018 PCP: Jacklyn Shell, FNP    Brief Narrative:  84 year old female with advanced Parkinson's disease who presents with altered mentation and fever, symptoms were persistent despite antibiotic therapy with ciprofloxacin.  She does mention worsening dysphasia with symptoms of aspiration, for the last 5 days.  On the initial physical examination her rectal temperature was 100.5, blood pressure 142/64, heart rate 88, respiratory rate 29, oxygen saturation 98%.  Have expiratory wheezing and scattered rales, heart S1-S2 present and rhythmic, abdomen soft nontender, trace lower extremity edema.  She does have rigidity but otherwise nonfocal, she is awake, alert and oriented.  Sodium 139, potassium 4.0, chloride 102, bicarb 22, glucose 136, 1029, creatinine 1.0, venous lactic acid 3.4, white count 13.7, hemoglobin 12.9, hematocrit 39.9, platelets 315.  Urine analysis specific gravity 1.016, 0-5 white cells, 0-5 red cells. Chest X-ray with increased lung markings bilaterally, no infiltrates.  Patient will be admitted to the hospital with the working diagnosis of metabolic encephalopathy due to aspiration pneumonitis, to rule out pneumonia with sepsis.    Assessment & Plan:   Active Problems:   Metabolic encephalopathy   Aspiration pneumonitis (HCC)   Sepsis ([place])   Altered mental status   1.  Aspiration pneumonitis/pneumonia to rule out sepsis.  Follow up chest film with positive infiltrate at the left base, will continue antibiotic therapy with IV ceftriaxone, continue IV fluids, follow cell count and temperature curve. Pending pro-calcitonine, depending on result will plan duration of antibiotic therapy. Continue aspiration precautions and follow speech therapy recommendations. Will hold on further IV fluids for now. Continue bronchodilator therapy, oxymetry monitoring and as needed supplemental 02 per [place] to target 02  saturation greater than 92%.   2.  Advanced Parkinson's disease with ambulatory dysfunction.  On Sinemet 3 times daily, duloxetine, alprazolam and melatonin. Out of bed as tolerated, follow with physical therapy recommendations.   3.  Hypertension.  On diltiazem 240 mg daily, and losartan 25 mg bedtime for blood pressure control.   4.  Dyslipidemia. Continue atorvastatin.  5.  History of CVA.  On atorvastatin and antiplatelet therapy with clopidogrel  6. Hypokalemia due to dehydration. Improved oral intake, will hold on IV fluids and will correct K with Kcl 80 meq in 2 divided doses. Follow on renal panel in am, renal function with preserved serum cr at 0.74. Serum bicarbonate at 30.    DVT prophylaxis: enoxaparin   Code Status:  full Family Communication: I spoke with patient's son at the bedside and all questions were addressed.  Disposition Plan/ discharge barriers: pending physical therapy evaluation may return to assisted living in am.    Consultants:     Procedures:     Antimicrobials:   IV ceftriaxone    Subjective: Patient is feeling better, dyspnea has improved but not back to baseline, no nausea or vomiting, no chest pain.   Objective: Vitals:   06/17/18 1920 06/17/18 2000 06/18/18 0538 06/18/18 0731  BP: (!) 153/70  130/60   Pulse: 86  89   Resp: (!) 22  (!) 22   Temp: 98.7 F (37.1 C)  97.8 F (36.6 C)   TempSrc:      SpO2: 94% 95% 92% 92%  Weight:      Height:        Intake/Output Summary (Last 24 hours) at 06/18/2018 0856 Last data filed at 06/17/2018 2300 Gross per 24 hour  Intake 240 ml  Output 425  ml  Net -185 ml   Filed Weights   06/17/18 1553  Weight: 81.6 kg    Examination:   General: deconditioned  Neurology: Awake and alert, non focal  E ENT: mild pallor, no icterus, oral mucosa moist Cardiovascular: No JVD. S1-S2 present, rhythmic, no gallops, rubs, or murmurs. No lower extremity edema. Pulmonary:  (anterior auscultation)  decreased breath sounds bilaterally at bases, adequate air movement, no wheezing, rhonchi or rales. Gastrointestinal. Abdomen with no organomegaly, non tender, no rebound or guarding Skin. No rashes Musculoskeletal: no joint deformities     Data Reviewed: I have personally reviewed following labs and imaging studies  CBC: Recent Labs  Lab 06/17/18 1554 06/18/18 0528  WBC 13.7* 10.0  NEUTROABS 10.7*  --   HGB 12.9 11.2*  HCT 39.9 34.9*  MCV 93.0 92.1  PLT 315 476   Basic Metabolic Panel: Recent Labs  Lab 06/17/18 1554 06/18/18 0528  NA 139 140  K 4.0 3.1*  CL 102 103  CO2 22 30  GLUCOSE 136* 114*  BUN 29* 21  CREATININE 1.01* 0.74  CALCIUM 10.0 9.9   GFR: Estimated Creatinine Clearance: 47.7 mL/min (by C-G formula based on SCr of 0.74 mg/dL). Liver Function Tests: Recent Labs  Lab 06/17/18 1554  AST 34  ALT 26  ALKPHOS 71  BILITOT 0.8  PROT 7.4  ALBUMIN 3.9   No results for input(s): LIPASE, AMYLASE in the last 168 hours. No results for input(s): AMMONIA in the last 168 hours. Coagulation Profile: Recent Labs  Lab 06/17/18 1554  INR 1.06   Cardiac Enzymes: No results for input(s): CKTOTAL, CKMB, CKMBINDEX, TROPONINI in the last 168 hours. BNP (last 3 results) No results for input(s): PROBNP in the last 8760 hours. HbA1C: No results for input(s): HGBA1C in the last 72 hours. CBG: No results for input(s): GLUCAP in the last 168 hours. Lipid Profile: No results for input(s): CHOL, HDL, LDLCALC, TRIG, CHOLHDL, LDLDIRECT in the last 72 hours. Thyroid Function Tests: No results for input(s): TSH, T4TOTAL, FREET4, T3FREE, THYROIDAB in the last 72 hours. Anemia Panel: No results for input(s): VITAMINB12, FOLATE, FERRITIN, TIBC, IRON, RETICCTPCT in the last 72 hours.    Radiology Studies: I have reviewed all of the imaging during this hospital visit personally     Scheduled Meds: . acetaminophen  500 mg Oral TID  . ALPRAZolam  0.5 mg Oral TID    . atorvastatin  10 mg Oral QHS  . Carbidopa-Levodopa ER  1 tablet Oral TID  . cholecalciferol  5,000 Units Oral Q1200  . clopidogrel  75 mg Oral Q1200  . diltiazem  240 mg Oral Daily  . DULoxetine  30 mg Oral Daily  . enoxaparin (LOVENOX) injection  40 mg Subcutaneous Q24H  . ipratropium-albuterol  3 mL Nebulization BID  . latanoprost  1 drop Both Eyes QHS  . losartan  25 mg Oral QHS  . Melatonin  3 mg Oral QHS  . montelukast  10 mg Oral Q1200  . multivitamin with minerals  1 tablet Oral Daily  . MUSCLE RUB   Topical TID  . oxyCODONE  2.5 mg Oral BID  . pantoprazole  40 mg Oral QAC breakfast   Continuous Infusions: . cefTRIAXone (ROCEPHIN)  IV    . dextrose 5 % and 0.9% NaCl 75 mL/hr at 06/17/18 2250     LOS: 1 day        Tawni Millers, MD [facility] Pager [phone]'

## 2018-06-18 NOTE — Evaluation (Signed)
Physical Therapy Evaluation Patient Details Name: [name] MRN: [id number] DOB: May 27, 1930 Today's Date: 06/18/2018   History of Present Illness  84yo female who has a history of being WC bound with complaints of weakness, malaise. Recently diagnosed with UTI and treated, but continues to deteriorate. Diagnosed with aspiration pneumonitis, and with plan to r/o sepsis. PMH anxiety, CVA, glaucoma, macular degeneration, Parkinsons, TIA, loop recorder implant, frequent falls with multiple old fractures R UE   Clinical Impression   Patient received in bed, very pleasant but very fatigued this morning, nonetheless willing to participate in skilled PT session. She requires ModA for rolling onto/off of bed pan, and Mod-MaxA for supine to sit transfers due to gross weakness as well as pain in R UE. While sitting EOB patient reported and appeared symptomatic. Unable to leave patient to get dynamap to check blood pressure due to postural sway and unsteadiness, so returned patient to supine where symptoms resolved. She required MaxAx2 for repositioning in bed, and was left with HOB elevated, alarm activated, and all other needs met this morning. Family and patient report significant concern with return to ALF due to frequent falls and difficulty in caring for herself- [name] will benefit from skilled PT services in the acute setting as well as transition to SNF for further care moving forward.     Follow Up Recommendations SNF    Equipment Recommendations  Other (comment)(defer to next venue )    Recommendations for Other Services       Precautions / Restrictions Precautions Precautions: Fall;Other (comment) Precaution Comments: watch BP with mobility  Restrictions Weight Bearing Restrictions: No      Mobility  Bed Mobility Overal bed mobility: Needs Assistance Bed Mobility: Rolling;Supine to Sit;Sit to Supine Rolling: Mod assist   Supine to sit: Max assist Sit to supine: Mod  assist   General bed mobility comments: difficulty using R UE for bed mobility due to pain; extended time and effort even with PT assist   Transfers                 General transfer comment: DNT due to wooziness and symptomatic presentation sitting EOB   Ambulation/Gait             General Gait Details: DNT due to safety concerns   Stairs            Wheelchair Mobility    Modified Rankin (Stroke Patients Only)       Balance Overall balance assessment: Needs assistance;History of Falls Sitting-balance support: Bilateral upper extremity supported;Feet supported Sitting balance-Leahy Scale: Fair   Postural control: Posterior lean                                   Pertinent Vitals/Pain Pain Assessment: No/denies pain    Home Living Family/patient expects to be discharged to:: Skilled nursing facility                 Additional Comments: family and patient report they do not feel she is getting the assistance she needs in ALF and would like to transition to SNF     Prior Function Level of [place]: Needs assistance   Gait / Transfers Assistance Needed: multiple falls especially when trying to transfer self to [facility], MaxA from staff to transfer at [place]  Extremity/Trunk Assessment   Upper Extremity Assessment Upper Extremity Assessment: Defer to OT evaluation    Lower Extremity Assessment Lower Extremity Assessment: Generalized weakness    Cervical / Trunk Assessment Cervical / Trunk Assessment: Kyphotic  Communication   Communication: No difficulties  Cognition Arousal/Alertness: Lethargic Behavior During Therapy: Flat affect Overall Cognitive Status: Within Functional Limits for tasks assessed                                        General Comments      Exercises     Assessment/Plan    PT Assessment Patient needs continued PT services  PT Problem List  Decreased strength;Decreased mobility;Decreased safety awareness;Decreased coordination;Decreased activity tolerance;Cardiopulmonary status limiting activity;Decreased balance;Pain       PT Treatment Interventions DME instruction;Therapeutic activities;Gait training;Therapeutic exercise;Patient/family education;Stair training;Balance training;Functional mobility training;Neuromuscular re-education;Manual techniques    PT Goals (Current goals can be found in the Care Plan section)  Acute Rehab PT Goals Patient Stated Goal: to get better, go to SNF  PT Goal Formulation: With patient/family Time For Goal Achievement: 07/02/18 Potential to Achieve Goals: Good    Frequency Min 2X/week   Barriers to discharge        Co-evaluation               AM-PAC PT "6 Clicks" Daily Activity  Outcome Measure Difficulty turning over in bed (including adjusting bedclothes, sheets and blankets)?: A Lot Difficulty moving from lying on back to sitting on the side of the bed? : A Lot Difficulty sitting down on and standing up from a chair with arms (e.g., wheelchair, bedside commode, etc,.)?: Unable Help needed moving to and from a bed to chair (including a wheelchair)?: Total Help needed walking in hospital room?: Total Help needed climbing 3-5 steps with a railing? : Total 6 Click Score: 8    End of Session   Activity Tolerance: Patient limited by fatigue;Treatment limited secondary to medical complications (Comment)(symptomatic presentation following supine to sit transfer, possible orthostatic hypotension ) Patient left: in bed;with bed alarm set;with family/visitor present;with call bell/phone within reach   PT Visit Diagnosis: Unsteadiness on feet (R26.81);Muscle weakness (generalized) (M62.81);History of falling (Z91.81);Other abnormalities of gait and mobility (R26.89)    Time: 5300-5110 PT Time Calculation (min) (ACUTE ONLY): 33 min   Charges:   PT Evaluation $PT Eval Moderate  Complexity: 1 Mod PT Treatments $Therapeutic Activity: 8-22 mins        [name] PT, DPT, CBIS  Supplemental Physical Therapist [place]   Pager [phone]

## 2018-06-19 DIAGNOSIS — J129 Viral pneumonia, unspecified: Secondary | ICD-10-CM

## 2018-06-19 LAB — BASIC METABOLIC PANEL
Anion gap: 9 (ref 5–15)
BUN: 20 mg/dL (ref 8–23)
CHLORIDE: 102 mmol/L (ref 98–111)
CO2: 30 mmol/L (ref 22–32)
CREATININE: 0.8 mg/dL (ref 0.44–1.00)
Calcium: 10 mg/dL (ref 8.9–10.3)
Glucose, Bld: 123 mg/dL — ABNORMAL HIGH (ref 70–99)
Potassium: 3.6 mmol/L (ref 3.5–5.1)
SODIUM: 141 mmol/L (ref 135–145)

## 2018-06-19 LAB — CBC WITH DIFFERENTIAL/PLATELET
BASOS ABS: 0 10*3/uL (ref 0.0–0.1)
BASOS PCT: 0 %
EOS ABS: 0.7 10*3/uL (ref 0.0–0.7)
EOS PCT: 10 %
HCT: 35.3 % — ABNORMAL LOW (ref 36.0–46.0)
HEMOGLOBIN: 11.3 g/dL — AB (ref 12.0–15.0)
Lymphocytes Relative: 30 %
Lymphs Abs: 2.2 10*3/uL (ref 0.7–4.0)
MCH: 29.6 pg (ref 26.0–34.0)
MCHC: 32 g/dL (ref 30.0–36.0)
MCV: 92.4 fL (ref 78.0–100.0)
Monocytes Absolute: 0.5 10*3/uL (ref 0.1–1.0)
Monocytes Relative: 7 %
NEUTROS PCT: 53 %
Neutro Abs: 3.8 10*3/uL (ref 1.7–7.7)
PLATELETS: 330 10*3/uL (ref 150–400)
RBC: 3.82 MIL/uL — AB (ref 3.87–5.11)
RDW: 13.8 % (ref 11.5–15.5)
WBC: 7.3 10*3/uL (ref 4.0–10.5)

## 2018-06-19 MED ORDER — PREMIER PROTEIN SHAKE
11.0000 [oz_av] | ORAL | Status: DC
  Administered 2018-06-19 – 2018-06-22 (×4): 11 [oz_av] via ORAL
  Filled 2018-06-19 (×4): qty 325.31

## 2018-06-19 NOTE — Progress Notes (Signed)
PROGRESS NOTE    [name]  XNT:[id number] DOB: 12-31-1929 DOA: 06/17/2018 PCP: Jacklyn Shell, FNP    Brief Narrative:  84 year old female with advanced Parkinson's disease who presents with altered mentation and fever, symptoms were persistent despite antibiotic therapy with ciprofloxacin. She does mention worsening dysphasia with symptoms of aspiration,for the last 5 days. On the initial physical examination her rectal temperature was 100.5, blood pressure 142/64, heart rate 88, respiratory rate 29, oxygen saturation 98%.Have expiratory wheezing and scattered rales, heart S1-S2 present and rhythmic, abdomen soft nontender, trace lower extremity edema. She does have rigidity but otherwise nonfocal, she is awake, alert and oriented. Sodium 139, potassium 4.0, chloride 102, bicarb 22, glucose 136, 1029, creatinine 1.0, venous lactic acid 3.4, white count 13.7, hemoglobin 12.9, hematocrit 39.9, platelets 315.Urine analysis specific gravity 1.016, 0-5 white cells, 0-5 redcells.ChestX-ray with increased lung markings bilaterally, no infiltrates.  Patient will be admitted to the hospital with the working diagnosis of metabolic encephalopathy due to aspiration pneumonitis,to rule outpneumonia withsepsis.    Assessment & Plan:   Active Problems:   Metabolic encephalopathy   Aspiration pneumonitis (HCC)   Sepsis ([place])   Altered mental status   1.Aspiration pneumonitis/ viral pneumonia complicated with sepsis ad acute hypoxic respiratory failure. Present on admission.Procalcitonin 0.12, along right base chest infiltrate, fever and generalized malaise, consistent with viral pneumonia. Will discontinue antibiotic therapy, will continue  aspiration precautions, bronchodilator therapy, oxymetry monitoring. Oxygen saturation 89 to 90 % on room air.     2.Advanced Parkinson's disease with ambulatory dysfunction. [place]ine  Sinemet 3 times daily, duloxetine, alprazolamand  melatonin. Will continue dysphagia 3 diet per speech recommendations, patient very weak and deconditioned, following physical therapy recommendations, patient will need SNF.   3.Hypertension. Well controlled with diltiazem 240 mg daily, and losartan 25 mg bedtime.    4.Dyslipidemia. On atorvastatin.  5.History of CVA with acute metabolic encephalopathy. Contineu atorvastatin and antiplatelet therapy with clopidogrel. Patient disorientated to place and time, no agitatio, continue supportive medical therapy. Patient's family at the bedside.   6. Hypokalemia due to dehydration. Serum K at 3,6, will continue to follow renal panel in am. Will hold on IV fluids for now. Renal function has remained preserved with serum cr at 0,80   7. Unspecified obesity. Will continue nutritional supplements per nutrition recommendation.   DVT prophylaxis: enoxaparin   Code Status:  full Family Communication: I spoke with patient's son at the bedside and all questions were addressed.  Disposition Plan/ discharge barriers: SNF when bed available.   Consultants:     Procedures:     Antimicrobials:     Subjective: Patient has been confused and disorientated, no agitation, poor appetite, no chest pain or dyspnea. Has been afebrile. Very weak and deconditioned.   Objective: Vitals:   06/18/18 2046 06/18/18 2103 06/19/18 0506 06/19/18 0846  BP: 137/63  135/64   Pulse: 85  84   Resp: 19  11   Temp: 98.5 F (36.9 C)  (!) 97.4 F (36.3 C)   TempSrc: Oral  Oral   SpO2: 92% 92% (!) 89% 90%  Weight:      Height:        Intake/Output Summary (Last 24 hours) at 06/19/2018 0924 Last data filed at 06/19/2018 0851 Gross per 24 hour  Intake 1255.9 ml  Output 1701 ml  Net -445.1 ml   Filed Weights   06/17/18 1553  Weight: 81.6 kg    Examination:   General: deconditioned  Neurology: Awake and alert,  non focal  E ENT: mild pallor, no icterus, oral mucosa moist Cardiovascular:  No JVD. S1-S2 present, rhythmic, no gallops, rubs, or murmurs. No lower extremity edema. Pulmonary: positive breath sounds bilaterally, decraesed air movement, no wheezing, rhonchi or rales. Anterior auscultation.  Gastrointestinal. Abdomen protuberant, no organomegaly, non tender, no rebound or guarding Skin. No rashes Musculoskeletal: no joint deformities     Data Reviewed: I have personally reviewed following labs and imaging studies  CBC: Recent Labs  Lab 06/17/18 1554 06/18/18 0528 06/19/18 0524  WBC 13.7* 10.0 7.3  NEUTROABS 10.7*  --  3.8  HGB 12.9 11.2* 11.3*  HCT 39.9 34.9* 35.3*  MCV 93.0 92.1 92.4  PLT 315 314 443   Basic Metabolic Panel: Recent Labs  Lab 06/17/18 1554 06/18/18 0528 06/19/18 0524  NA 139 140 141  K 4.0 3.1* 3.6  CL 102 103 102  CO2 22 30 30   GLUCOSE 136* 114* 123*  BUN 29* 21 20  CREATININE 1.01* 0.74 0.80  CALCIUM 10.0 9.9 10.0   GFR: Estimated Creatinine Clearance: 47.7 mL/min (by C-G formula based on SCr of 0.8 mg/dL). Liver Function Tests: Recent Labs  Lab 06/17/18 1554  AST 34  ALT 26  ALKPHOS 71  BILITOT 0.8  PROT 7.4  ALBUMIN 3.9   No results for input(s): LIPASE, AMYLASE in the last 168 hours. No results for input(s): AMMONIA in the last 168 hours. Coagulation Profile: Recent Labs  Lab 06/17/18 1554  INR 1.06   Cardiac Enzymes: No results for input(s): CKTOTAL, CKMB, CKMBINDEX, TROPONINI in the last 168 hours. BNP (last 3 results) No results for input(s): PROBNP in the last 8760 hours. HbA1C: No results for input(s): HGBA1C in the last 72 hours. CBG: No results for input(s): GLUCAP in the last 168 hours. Lipid Profile: No results for input(s): CHOL, HDL, LDLCALC, TRIG, CHOLHDL, LDLDIRECT in the last 72 hours. Thyroid Function Tests: No results for input(s): TSH, T4TOTAL, FREET4, T3FREE, THYROIDAB in the last 72 hours. Anemia Panel: No results for input(s): VITAMINB12, FOLATE, FERRITIN, TIBC, IRON, RETICCTPCT  in the last 72 hours.    Radiology Studies: I have reviewed all of the imaging during this hospital visit personally     Scheduled Meds: . acetaminophen  500 mg Oral TID  . ALPRAZolam  0.5 mg Oral TID  . atorvastatin  10 mg Oral QHS  . Carbidopa-Levodopa ER  1 tablet Oral TID  . cholecalciferol  5,000 Units Oral Q1200  . clopidogrel  75 mg Oral Q1200  . diltiazem  240 mg Oral Daily  . DULoxetine  30 mg Oral Daily  . enoxaparin (LOVENOX) injection  40 mg Subcutaneous Q24H  . ipratropium-albuterol  3 mL Nebulization BID  . latanoprost  1 drop Both Eyes QHS  . losartan  25 mg Oral QHS  . Melatonin  3 mg Oral QHS  . montelukast  10 mg Oral Q1200  . multivitamin with minerals  1 tablet Oral Daily  . MUSCLE RUB   Topical TID  . oxyCODONE  2.5 mg Oral BID  . pantoprazole  40 mg Oral QAC breakfast   Continuous Infusions: . cefTRIAXone (ROCEPHIN)  IV 1 g (06/18/18 1328)     LOS: 2 days        Mauricio Gerome Apley, MD [facility] Pager [phone]

## 2018-06-19 NOTE — Progress Notes (Signed)
Initial Nutrition Assessment  DOCUMENTATION CODES:   Obesity unspecified  INTERVENTION:   Provide Premier Protein daily, each supplement provides 160 kcal and 30 grams of protein.   NUTRITION DIAGNOSIS:   Inadequate oral intake related to dysphagia(AMS) as evidenced by per patient/family report.  GOAL:   Patient will meet greater than or equal to 90% of their needs  MONITOR:   PO intake, Labs, Weight trends, Skin, I & O's  REASON FOR ASSESSMENT:   Malnutrition Screening Tool    ASSESSMENT:   84 year old female with advanced Parkinson's disease who presents with altered mentation and fever, symptoms were persistent despite antibiotic therapy with ciprofloxacin.    Patient reports dysphagia for 5 days PTA. SLP has assessed and recommends regular diet with thin liquids. Pt with mild-moderate aspiration risk.  Pt has been consuming 70-85% of meals on 8/17 (providing ~350-450 kcal and 12-24g protein each). Will order Premier Protein to help meet protein needs.  Pt reports 14 lb of weight loss over the last 6 weeks, that is 7% wt loss x 6 weeks which is significant for time frame. Unable to verify weight loss per weight records.   Labs reviewed. Medications: Vitamin D tablet daily, Multivitamin with minerals daily  NUTRITION - FOCUSED PHYSICAL EXAM:  Nutrition focused physical exam shows no sign of depletion of muscle mass or body fat.  Diet Order:   Diet Order            Diet Heart Room service appropriate? Yes; Fluid consistency: Thin  Diet effective now              EDUCATION NEEDS:   Not appropriate for education at this time  Skin:  Skin Assessment: Reviewed RN Assessment  Last BM:  8/17  Height:   Ht Readings from Last 1 Encounters:  06/17/18 5' (1.524 m)    Weight:   Wt Readings from Last 1 Encounters:  06/17/18 81.6 kg    Ideal Body Weight:  45.5 kg  BMI:  Body mass index is 35.13 kg/m.  Estimated Nutritional Needs:   Kcal:   1400-1600  Protein:  60-70g  Fluid:  1.6L/day  Clayton Bibles, MS, RD, LDN [place] [facility] Pager: [phone] After Hours Pager: [phone]

## 2018-06-20 LAB — CBC WITH DIFFERENTIAL/PLATELET
BASOS ABS: 0 10*3/uL (ref 0.0–0.1)
Basophils Relative: 0 %
Eosinophils Absolute: 0.7 10*3/uL (ref 0.0–0.7)
Eosinophils Relative: 10 %
HCT: 38 % (ref 36.0–46.0)
Hemoglobin: 12.2 g/dL (ref 12.0–15.0)
LYMPHS PCT: 35 %
Lymphs Abs: 2.6 10*3/uL (ref 0.7–4.0)
MCH: 29.8 pg (ref 26.0–34.0)
MCHC: 32.1 g/dL (ref 30.0–36.0)
MCV: 92.9 fL (ref 78.0–100.0)
MONO ABS: 0.5 10*3/uL (ref 0.1–1.0)
Monocytes Relative: 6 %
Neutro Abs: 3.5 10*3/uL (ref 1.7–7.7)
Neutrophils Relative %: 49 %
Platelets: 373 10*3/uL (ref 150–400)
RBC: 4.09 MIL/uL (ref 3.87–5.11)
RDW: 13.8 % (ref 11.5–15.5)
WBC: 7.3 10*3/uL (ref 4.0–10.5)

## 2018-06-20 LAB — BASIC METABOLIC PANEL
ANION GAP: 10 (ref 5–15)
BUN: 19 mg/dL (ref 8–23)
CO2: 29 mmol/L (ref 22–32)
Calcium: 10.2 mg/dL (ref 8.9–10.3)
Chloride: 101 mmol/L (ref 98–111)
Creatinine, Ser: 0.79 mg/dL (ref 0.44–1.00)
GFR calc Af Amer: >60 mL/min (ref >60)
GFR calc non Af Amer: >60 mL/min (ref >60)
GLUCOSE: 109 mg/dL — AB (ref 70–99)
POTASSIUM: 3.7 mmol/L (ref 3.5–5.1)
Sodium: 140 mmol/L (ref 135–145)

## 2018-06-20 MED ORDER — ALPRAZOLAM 0.5 MG PO TABS: 0.5000 mg | ORAL_TABLET | Freq: Three times a day (TID) | ORAL | 0 refills | Status: DC

## 2018-06-20 MED ORDER — PREMIER PROTEIN SHAKE: 11.0000 [oz_av] | ORAL | 0 refills | Status: AC

## 2018-06-20 NOTE — NC FL2 (Signed)
[place] LEVEL OF CARE SCREENING TOOL     IDENTIFICATION  Patient Name: [name] Birthdate: 04-16-1930 Sex: female Admission Date (Current Location): 06/17/2018  [facility] and [place] Number:  Herbalist and Address:  [facility],  [place] [address] [place]      Provider Number: [id number]  Attending Physician Name and Address:  Tawni Millers  Relative Name and Phone Number:       Current Level of Care: Hospital Recommended Level of Care: [place] Prior Approval Number:  Pending  Date Approved/Denied:   PASRR Number:    Discharge Plan: SNF    Current Diagnoses: Patient Active Problem List   Diagnosis Date Noted  . Sepsis ([place])   . Altered mental status   . Metabolic encephalopathy 74/06/1447  . Aspiration pneumonitis ([place]) 06/17/2018  . Tachycardia 08/12/2015  . Abnormality of gait 02/05/2015  . Palpitations 05/28/2014  . TIA (transient ischemic attack) 05/25/2014  . Dyslipidemia 05/25/2014  . Parkinson disease ([place]) 05/25/2014  . H/O: CVA (cerebrovascular accident) 05/25/2014  . Paralysis agitans ([place]) 08/31/2013  . Dizziness and giddiness 08/31/2013    Orientation RESPIRATION BLADDER Height & Weight     Self, Time, Situation, Place  Normal Continent, External catheter Weight: 179 lb 14.3 oz (81.6 kg) Height:  5' (152.4 cm)  BEHAVIORAL SYMPTOMS/MOOD NEUROLOGICAL BOWEL NUTRITION STATUS      Continent Diet(See dc summary)  SLP Diet Recommendations: Regular solids;Thin liquid   Liquid Administration via: Straw(using chin tuck)   Medication Administration: Crushed with puree   Supervision: Patient able to self feed;Staff to assist with self feeding   Compensations: Slow rate;Small sips/bites;Chin tuck   Postural Changes: Remain semi-upright after after feeds/meals (Comment)   Oral Care Recommendations: Oral care BID       AMBULATORY STATUS COMMUNICATION OF NEEDS  Skin   Extensive Assist(Needs assistance with transfers. ) Verbally Normal                       Personal Care Assistance Level of Assistance  Bathing, Dressing, Feeding Bathing Assistance: Maximum assistance Feeding assistance: Independent Dressing Assistance: Maximum assistance     Functional Limitations Info  Sight, Hearing, Speech Sight Info: Adequate Hearing Info: Adequate Speech Info: Adequate    SPECIAL CARE FACTORS FREQUENCY  PT (By licensed PT), OT (By licensed OT)     PT Frequency: 5x/week OT Frequency: 5x/week            Contractures Contractures Info: Not present    Additional Factors Info  Code Status, Allergies Code Status Info: Full Allergies Info: Comtan Entacapone, Mirapex Pramipexole Dihydrochloride, Sulfa Antibiotics, Ciprofloxacin           Current Medications (06/20/2018):  This is the current hospital active medication list Current Facility-Administered Medications  Medication Dose Route Frequency Provider Last Rate Last Dose  . acetaminophen (TYLENOL) tablet 500 mg  500 mg Oral TID Tawni Millers, MD   500 mg at 06/20/18 0950  . ALPRAZolam Duanne Moron) tablet 0.5 mg  0.5 mg Oral TID Tawni Millers, MD   0.5 mg at 06/20/18 0950  . atorvastatin (LIPITOR) tablet 10 mg  10 mg Oral QHS Arrien, Jimmy Picket, MD   10 mg at 06/19/18 2300  . Carbidopa-Levodopa ER (SINEMET CR) 25-100 MG tablet controlled release 1 tablet  1 tablet Oral TID Arrien, Jimmy Picket, MD   1 tablet at 06/20/18 0950  . cholecalciferol (VITAMIN D) tablet 5,000  Units  5,000 Units Oral Q1200 Tawni Millers, MD   5,000 Units at 06/18/18 1331  . clopidogrel (PLAVIX) tablet 75 mg  75 mg Oral Q1200 Tawni Millers, MD   75 mg at 06/19/18 1718  . diltiazem (CARDIZEM CD) 24 hr capsule 240 mg  240 mg Oral Daily Arrien, Jimmy Picket, MD   240 mg at 06/20/18 0950  . DULoxetine (CYMBALTA) DR capsule 30 mg  30 mg Oral Daily Arrien, Jimmy Picket, MD    30 mg at 06/20/18 0950  . enoxaparin (LOVENOX) injection 40 mg  40 mg Subcutaneous Q24H Arrien, Jimmy Picket, MD   40 mg at 06/19/18 2300  . ipratropium-albuterol (DUONEB) 0.5-2.5 (3) MG/3ML nebulizer solution 3 mL  3 mL Nebulization Q2H PRN Arrien, Jimmy Picket, MD      . ipratropium-albuterol (DUONEB) 0.5-2.5 (3) MG/3ML nebulizer solution 3 mL  3 mL Nebulization BID Arrien, Jimmy Picket, MD   3 mL at 06/20/18 0743  . latanoprost (XALATAN) 0.005 % ophthalmic solution 1 drop  1 drop Both Eyes QHS Arrien, Jimmy Picket, MD   1 drop at 06/19/18 2300  . losartan (COZAAR) tablet 25 mg  25 mg Oral QHS Arrien, Jimmy Picket, MD   25 mg at 06/19/18 2300  . Melatonin TABS 3 mg  3 mg Oral QHS Arrien, Jimmy Picket, MD   3 mg at 06/19/18 2300  . montelukast (SINGULAIR) tablet 10 mg  10 mg Oral Q1200 Arrien, Jimmy Picket, MD   10 mg at 06/19/18 1722  . multivitamin with minerals tablet 1 tablet  1 tablet Oral Daily Arrien, Jimmy Picket, MD   1 tablet at 06/20/18 0950  . MUSCLE RUB CREA   Topical TID Arrien, Jimmy Picket, MD      . ondansetron [facility]) tablet 4 mg  4 mg Oral Q6H PRN Arrien, Jimmy Picket, MD       Or  . ondansetron [facility]) injection 4 mg  4 mg Intravenous Q6H PRN Arrien, Jimmy Picket, MD      . oxyCODONE (Oxy IR/ROXICODONE) immediate release tablet 2.5 mg  2.5 mg Oral BID Arrien, Jimmy Picket, MD   2.5 mg at 06/20/18 0950  . oxymetazoline (AFRIN) 0.05 % nasal spray 1 spray  1 spray Each Nare PRN Arrien, Jimmy Picket, MD      . pantoprazole (PROTONIX) EC tablet 40 mg  40 mg Oral QAC breakfast Arrien, Jimmy Picket, MD   40 mg at 06/20/18 7035  . protein supplement (PREMIER PROTEIN) liquid - approved for s/p bariatric surgery  11 oz Oral Q24H Arrien, Jimmy Picket, MD   11 oz at 06/19/18 1720     Discharge Medications: Please see discharge summary for a list of discharge medications.  Relevant Imaging Results:  Relevant Lab  Results:   Additional Information SSN: [id number]  Servando Snare, LCSW

## 2018-06-20 NOTE — Care Management Note (Signed)
Case Management Note  Patient Details  Name: [name] MRN: [id number] Date of Birth: June 13, 1930  Subjective/Objective:                  84 year old female with advanced Parkinson's disease who presents with altered mentation and fever, symptoms were persistent despite antibiotic therapy with ciprofloxacin. She does mention worsening dysphasia with symptoms of aspiration,for the last 5 days. On the initial physical examination her rectal temperature was 100.5, blood pressure 142/64, heart rate 88, respiratory rate 29, oxygen saturation 98%.Have expiratory wheezing and scattered rales, heart S1-S2 present and rhythmic, abdomen soft nontender, trace lower extremity edema. She does have rigidity but otherwise nonfocal, she is awake, alert and oriented. Sodium 139, potassium 4.0, chloride 102, bicarb 22, glucose 136, 1029, creatinine 1.0, venous lactic acid 3.4, white count 13.7, hemoglobin 12.9, hematocrit 39.9, platelets 315.Urine analysis specific gravity 1.016, 0-5 white cells, 0-5 redcells.ChestX-ray with increased lung markings bilaterally, no infiltrates.  Patient will be admitted to the hospital with the working diagnosis of metabolic encephalopathy due to aspiration pneumonitis,to rule outpneumonia withsepsis.  Action/Plan: csw for snf placement following for progression and cm needs  Expected Discharge Date:  (unknown)               Expected Discharge Plan:     In-House Referral:     Discharge planning Services  CM Consult  Post Acute Care Choice:    Choice offered to:     DME Arranged:    DME Agency:     HH Arranged:    HH Agency:     Status of Service:  In process, will continue to follow  If discussed at Long Length of Stay Meetings, dates discussed:    Additional Comments:  Leeroy Cha, RN 06/20/2018, 11:37 AM

## 2018-06-20 NOTE — Care Management Important Message (Signed)
Important Message  Patient Details  Name: [name] MRN: [id number] Date of Birth: [birth year]   Medicare Important Message Given:  Yes    Kerin Salen 06/20/2018, 12:03 [place] Message  Patient Details  Name: [name] MRN: [id number] Date of Birth: 05-29-1930   Medicare Important Message Given:  Yes    Kerin Salen 06/20/2018, 12:03 PM

## 2018-06-20 NOTE — Clinical Social Work Note (Signed)
Clinical Social Work Assessment  Patient Details  Name: [name] MRN: [id number] Date of Birth: Apr 18, 1930  Date of referral:  06/20/18               Reason for consult:  Facility Placement                Permission sought to share information with:  Case Manager, Customer service manager, Family Supports Permission granted to share information::  Yes, Verbal Permission Granted  Name::     Public librarian::  SNF  Relationship::  son  Contact Information:     Housing/Transportation Living arrangements for the past 2 months:  [place] of Information:  Patient, Medical Team Patient Interpreter Needed:  None Criminal Activity/Legal Involvement Pertinent to Current Situation/Hospitalization:  No - Comment as needed Significant Relationships:  Adult Children, Warehouse manager Lives with:  Facility Resident Do you feel safe going back to the place where you live?  Yes Need for family participation in patient care:  Yes (Comment)  Care giving concerns:  Patient feels that she is not receiving the assistance she needs at ALF. Patient and family report multiple falls at the facility.    Social Worker assessment / plan:  LCSW following for SNF placement.   No formal consult.   No formal consult. Per notes and RN patient is from [facility]. Patient has been at facility for 5 years. Previously with spouse who passed in Dec.   Patient and family would like patient to go to SNF at [facility] for rehab, potential LTC care. Patient is in a wheelchair at baseline and needs assistance with transfers. Patient reports not receiving the the assistance she feels she needs at ALF. Patient and son report multiple falls at facility.   Patient and family agreable to SNF at dc. Prefer [facility].     Employment status:  Retired Nurse, adult PT Recommendations:  [place] / Referral to community resources:      Patient/Family's Response to care:  Patient and family thankful for LCSW visit.   Patient/Family's Understanding of and Emotional Response to Diagnosis, Current Treatment, and Prognosis:  Patient and family are realistic about patient's goals and plan of care. Patietn and family agreeable to SNF.   Emotional Assessment Appearance:  Appears stated age Attitude/Demeanor/Rapport:    Affect (typically observed):  Accepting, Calm Orientation:  Oriented to Self, Oriented to Place, Oriented to  Time, Oriented to Situation Alcohol / Substance use:  Not Applicable Psych involvement (Current and /or in the community):     Discharge Needs  Concerns to be addressed:  No discharge needs identified Readmission within the last 30 days:  No Current discharge risk:  None Barriers to Discharge:  Continued Medical Work up   [employer], LCSW 06/20/2018, 10:09 AM

## 2018-06-20 NOTE — Discharge Summary (Addendum)
Physician Discharge Summary  [name] QMG:[id number] DOB: [birth year] DOA: 06/17/2018  PCP: Jacklyn Shell, FNP  Admit date: 06/17/2018 Discharge date: 06/20/2018  Admitted From: Home Disposition:   SNF  Recommendations for Outpatient Follow-up and new medication changes:  1. Follow up with [facility]: na   Equipment/Devices: na    Discharge Condition: stable  CODE STATUS: full  Diet recommendation:  Heart healthy   Brief/Interim Summary: 84 year old female with advanced Parkinson's disease who presents with altered mentation and fever, symptoms were persistent despite antibiotic therapy with ciprofloxacin. She does mention worsening dysphagia with symptoms of aspiration,for the last 5 days. On the initial physical examination her rectal temperature was 100.5, blood pressure 142/64, heart rate 88, respiratory rate 29, oxygen saturation 98%.Her lugs had expiratory wheezing and scattered rales, heart S1-S2 present and rhythmic, abdomen soft nontender, trace lower extremity edema. She does have rigidity but otherwise nonfocal, she is awake, alert and oriented. Sodium 139, potassium 4.0, chloride 102, bicarb 22, glucose 136, creatinine 1.0, venous lactic acid 3.4, white count 13.7, hemoglobin 12.9, hematocrit 39.9, platelets 315.Urine analysis specific gravity 1.016, 0-5 white cells, 0-5 redcells.ChestX-ray with increased lung markings bilaterally, no infiltrates.  Patient will be admitted to the hospital with the working diagnosis of metabolic encephalopathy due to aspiration pneumonitis,to rule outpneumonia withsepsis.   1.  Aspiration pneumonitis/viral pneumonia complicated with sepsis and acute hypoxic respiratory failure, present on admission.  Patient was admitted to the medical ward, she was placed on intravenous fluids and IV antibiotic therapy with ceftriaxone.  Further radiographic surveillance had a faint right base infiltrate, no  leukocytosis, patient remained afebrile, blood cultures no growth, procalcitonin 0.12.  Clinical picture consistent with viral pneumonia or aspiration pneumonitis.  Antibiotic therapy was discontinued with good toleration.  Her oximetry at discharge 94% on room air.  She had episodes of disorientation but overall back to her baseline.  Patient had a formal swallow evaluation with recommendations for aspiration precautions.  Regular solids with thin liquids, medications to be crushed with pure.   2.  Advanced Parkinson's disease with amatory dysfunction.  Patient was continued on Sinemet, duloxetine, alprazolam and melatonin.  She was seen by physical therapy with recommendations to transfer to skilled nursing facility for rehabilitation.  3.  Hypertension.  Pressure remained well controlled with diltiazem and losartan.  4.  Dyslipidemia.  Patient was continued on atorvastatin.  5.  History of CVA with acute metabolic encephalopathy.  No further focal neurologic deficit, continue atorvastatin, antiplatelet therapy with clopidogrel.   6.  Hypokalemia due to dehydration.  Patient tolerated well to intravenous fluids, potassium was corrected, renal function remained stable.  Discharge creatinine 0.79, potassium 3.7, serum bicarbonate 29.   7.  Unspecified obesity.  She was seen by nutritional services and supplements were added.  8.  Type 2 diabetes mellitus.  Patient will resume metformin at discharge.  Discharge Diagnoses:  Active Problems:   Metabolic encephalopathy   Aspiration pneumonitis (HCC)   Sepsis ([place])   Altered mental status    Discharge Instructions   Allergies as of 06/21/2018      Reactions   Comtan [entacapone] Other (See Comments)   Reaction:  Unknown    Mirapex [pramipexole Dihydrochloride] Other (See Comments)   Reaction:  Unknown    Sulfa Antibiotics Nausea And Vomiting   Ciprofloxacin Other (See Comments)   Reaction:  Made pt feel crazy       Medication List     STOP taking these  medications   Carbidopa-Levodopa ER 25-100 MG tablet controlled release Commonly known as:  SINEMET CR Replaced by:  carbidopa-levodopa 25-100 MG tablet   ciprofloxacin 250 MG tablet Commonly known as:  CIPRO   diltiazem 120 MG 24 hr capsule Commonly known as:  CARDIZEM CD   oxyCODONE 5 MG immediate release tablet Commonly known as:  Oxy IR/ROXICODONE   pantoprazole 40 MG tablet Commonly known as:  PROTONIX Replaced by:  pantoprazole sodium 40 mg/20 mL Pack     TAKE these medications   Acetaminophen 500 MG coapsule Take 500 mg by mouth 3 (three) times daily.   ALPRAZolam 0.5 MG tablet Commonly known as:  XANAX Take 1 tablet (0.5 mg total) by mouth 3 (three) times daily.   atorvastatin 10 MG tablet Commonly known as:  LIPITOR Take 10 mg by mouth at bedtime.   BIOFREEZE 4 % Gel Generic drug:  Menthol (Topical Analgesic) Apply 1 application topically 3 (three) times daily.   carbidopa-levodopa 25-100 MG tablet Commonly known as:  SINEMET IR Take 1 tablet by mouth every 8 (eight) hours. Replaces:  Carbidopa-Levodopa ER 25-100 MG tablet controlled release   clopidogrel 75 MG tablet Commonly known as:  PLAVIX Take 75 mg by mouth daily at 12 noon.   diltiazem 60 MG tablet Commonly known as:  CARDIZEM Take 1 tablet (60 mg total) by mouth every 6 (six) hours.   DULoxetine 30 MG capsule Commonly known as:  CYMBALTA Take 30 mg by mouth daily.   glucose blood test strip 1 each by Other route as needed for other ([place]). Use as instructed   losartan 25 MG tablet Commonly known as:  COZAAR Take 25 mg by mouth at bedtime.   Melatonin 3 MG Caps Take 3 mg by mouth at bedtime.   metFORMIN 500 MG tablet Commonly known as:  GLUCOPHAGE Take 500 mg by mouth 2 (two) times daily before a meal.   montelukast 10 MG tablet Commonly known as:  SINGULAIR Take 10 mg by mouth daily at 12 noon.   oxymetazoline  0.05 % nasal spray Commonly known as:  AFRIN Place 1 spray into both nostrils as needed (for nose bleeds).   pantoprazole sodium 40 mg/20 mL Pack Commonly known as:  PROTONIX Take 20 mLs (40 mg total) by mouth daily. Start taking on:  06/22/2018 Replaces:  pantoprazole 40 MG tablet   protein supplement shake Liqd Commonly known as:  PREMIER PROTEIN Take 325 mLs (11 oz total) by mouth daily.   TAB-A-VITE Tabs Take 1 tablet by mouth daily at 12 noon.   Travoprost (BAK Free) 0.004 % Soln ophthalmic solution Commonly known as:  TRAVATAN Place 1 drop into both eyes at bedtime.   VENTOLIN HFA 108 (90 Base) MCG/ACT inhaler Generic drug:  albuterol Inhale 2 puffs into the lungs every 6 (six) hours as needed for wheezing or shortness of breath.   Vitamin D3 5000 units Caps Take 5,000 Units by mouth daily at 12 noon.       Allergies  Allergen Reactions  . Comtan [Entacapone] Other (See Comments)    Reaction:  Unknown   . Mirapex [Pramipexole Dihydrochloride] Other (See Comments)    Reaction:  Unknown   . Sulfa Antibiotics Nausea And Vomiting  . Ciprofloxacin Other (See Comments)    Reaction:  Made pt feel crazy     Consultations:     Procedures/Studies: Dg Chest 2 View  Result Date: 06/18/2018 CLINICAL DATA:  Dyspnea EXAM: CHEST -  2 VIEW COMPARISON:  06/17/2018 FINDINGS: Mild cardiac enlargement.  Cardiac loop recorder. Progression of bibasilar airspace disease which may be atelectasis or pneumonia. No effusion or edema IMPRESSION: Progression bibasilar airspace disease. Electronically Signed   By: Franchot Gallo M.D.   On: 06/18/2018 09:32   Dg Chest 2 View  Result Date: 06/17/2018 CLINICAL DATA:  Patient BIB per EMS from [facility]. Pt has had altered mental status for x 2 days and a concussion 1 week ago. Pt has had SOB on and off with increased lethargy and tachypnea per EMS. Hx of UTI-being treated with antibiotics.*comment was truncated* EXAM: CHEST - 2 VIEW  COMPARISON:  08/12/2015 FINDINGS: Normal cardiac silhouette. No effusion, infiltrate pneumothorax. Mild central venous congestion. Remote RIGHT humerus fracture. Degenerative osteophytosis of the spine. IMPRESSION: No acute cardiopulmonary process. Electronically Signed   By: Suzy Bouchard M.D.   On: 06/17/2018 16:27   Dg Shoulder Right  Result Date: 06/05/2018 CLINICAL DATA:  Fall, known right humerus fracture EXAM: RIGHT SHOULDER - 2+ VIEW COMPARISON:  None. FINDINGS: Old ununited right proximal/mid humeral shaft fracture with angulation. Mild degenerative changes of the right shoulder. The visualized soft tissues are unremarkable. Visualized right lung is clear. 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 Electronically Signed   By: Julian Hy M.D.   On: 06/05/2018 21:30   Dg Wrist Complete Right  Result Date: 06/05/2018 CLINICAL DATA:  Fall EXAM: RIGHT WRIST - COMPLETE 3+ VIEW COMPARISON:  11/07/2015 FINDINGS: No acute fracture or dislocation is seen. Old/healed deformity related to prior radial styloid fracture. The visualized soft tissues are unremarkable. IMPRESSION: Old/healed deformity related to prior radial styloid fracture. No acute fracture is seen. Electronically Signed   By: Julian Hy M.D.   On: 06/05/2018 21:31   Ct Head Wo Contrast  Result Date: 06/05/2018 CLINICAL DATA:  Fall, abrasion to right forehead EXAM: CT HEAD WITHOUT CONTRAST TECHNIQUE: Contiguous axial images were obtained from the base of the skull through the vertex without intravenous contrast. COMPARISON:  10/29/2017 FINDINGS: Brain: No evidence of acute infarction, hemorrhage, hydrocephalus, extra-axial collection or mass lesion/mass effect. Global cortical atrophy. Subcortical white matter and periventricular small vessel ischemic changes. Vascular: Intracranial atherosclerosis. Skull: Normal. Negative for fracture or focal lesion. Sinuses/Orbits: The visualized paranasal sinuses are  essentially clear. The mastoid air cells are unopacified. Other: None. IMPRESSION: No evidence of acute intracranial abnormality. Atrophy with small vessel ischemic changes. Electronically Signed   By: Julian Hy M.D.   On: 06/05/2018 20:25   Dg Hand Complete Right  Result Date: 06/05/2018 CLINICAL DATA:  Fall EXAM: RIGHT HAND - COMPLETE 3+ VIEW COMPARISON:  None. FINDINGS: No fracture or dislocation is seen. The joint spaces are essentially preserved. The visualized soft tissues are unremarkable. IMPRESSION: Negative. Electronically Signed   By: Julian Hy M.D.   On: 06/05/2018 21:32   Dg Swallowing Func-speech Pathology  Result Date: 06/18/2018 Objective Swallowing Evaluation: Type of Study: MBS-Modified Barium Swallow Study  Patient Details Name: MEKENNA FINAU MRN: [id number] Date of Birth: [birth year] Today's Date: 06/18/2018 Time: SLP Start Time (ACUTE ONLY): 35 -SLP Stop Time (ACUTE ONLY): 1145 SLP Time Calculation (min) (ACUTE ONLY): 30 min Past Medical History: Past Medical History: Diagnosis Date . Abnormality of gait 02/05/2015 . Anxiety  . Arthritis  . Asthma  . Cerebrovascular disease   CVA '03 . Depression  . Dyslipidemia  . Endometriosis  . GERD (gastroesophageal reflux disease)  . Glaucoma  . Headache(784.0)  . Hypertension  .  Macular degeneration   bilateral . Obesity  . Paralysis agitans ([place]) 08/31/2013 . Parkinson disease ([place])  . TIA (transient ischemic attack)   Multiple Past Surgical History: Past Surgical History: Procedure Laterality Date . ABDOMINAL HYSTERECTOMY   . CATARACT EXTRACTION, BILATERAL   . GALLBLADDER SURGERY   . LOOP RECORDER IMPLANT  05-28-14  MDT LINQ implanted by Dr Caryl Comes for cryptogenic stroke/palpitations . LOOP RECORDER IMPLANT N/A 05/28/2014  Procedure: LOOP RECORDER IMPLANT;  Surgeon: Deboraha Sprang, MD;  Location: [facility] LAB;  Service: Cardiovascular;  Laterality: N/A; . PILONIDAL CYST RESECTION   . TILT TABLE STUDY  05-28-14  orthostatic hypertension  . TILT TABLE STUDY N/A 05/28/2014  Procedure: TILT TABLE STUDY;  Surgeon: Deboraha Sprang, MD;  Location: [facility] CATH LAB;  Service: Cardiovascular;  Laterality: N/A; . TONSILLECTOMY   HPI: Hanaan Gancarz Robertsonis a 84 y.o.femalewith medical history significant ofParkinson's disease and ambulatory dysfunction, wheelchair-bound. She was noted to have generalized weakness, malaise and poor appetite over the last 5 days. She was diagnosed with urine tract infection 24 hours after symptoms started and received ciprofloxacin 48 hours after her symptoms started.Despite antibiotic therapy she continued to deteriorate andtoday she was found febrile, dyspneic and ill looking appearing. She was transferred to the hospital for furtherevaluation. On direct questioning patient mentions experiencing worsening dysphagia for the last 5 days, persistent, associated with severe coughing and choking mainly postprandial,no improving or worsening factors. Denies dyspnea but positive wheezing. She has been in a wheelchair for last few years due to her Parkinson's disease. In the outpatient clinic itwas suggested that she hasswallow dysfunction and a swallow evaluation was planned.  Most recent CXR is showing progression of bibasilar airspace disease.   Subjective: The patient was seen in radiology for MBS. Assessment / Plan / Recommendation CHL IP CLINICAL IMPRESSIONS 06/18/2018 Clinical Impression MBS was completed using thin liquids, nectar thick liquids, pureed material and dry solids.  The patient presented with a mild  oropharyngeal dysphagia.  The oral phase was marked by decreased lingual motion that led to delayed oral transit and oral residue.  She generally displayed good oral control of the bolus but intermittently had some issues with material falling into the buccal area.  The pharyngeal phase was marked by decreased hyo-laryngeal elevation and anterior movement, decreased laryngeal vestibule closure, mildly reduced  base of tongue retraction and prominence of the cricopharyngeus.  She had intermittent mild residue at the vallecula.  Penetration into the laryngeal vestibule was seen prior to the swallow given self fed cup sips of thin liquids.   Cued cough and reswallow were successful to clear material.  Chin tuck was also successful to prevent.  Esophageal sweep did not reveal overt issues.  Recommend a regular diet with thin liquids.  Patient should tuck her chin with liquids.  Use of a straw facilitated use of chin tuck.  If patient is unable to use a chin tuck recommend using nectar thick liquids.  ST will follow for therapeutic diet tolerance and to initiate swallowing therapy.  She would benefit from ST follow up at next level of care.   SLP Visit Diagnosis Dysphagia, oropharyngeal phase (R13.12) Attention and concentration deficit following -- Frontal lobe and executive function deficit following -- Impact on safety and function Mild aspiration risk   CHL IP TREATMENT RECOMMENDATION 06/18/2018 Treatment Recommendations Therapy as outlined in treatment plan below   Prognosis 06/18/2018 Prognosis for Safe Diet Advancement Fair Barriers to Reach Goals -- Barriers/Prognosis Comment -- CHL IP  DIET RECOMMENDATION 06/18/2018 SLP Diet Recommendations Regular solids;Thin liquid Liquid Administration via Straw Medication Administration Crushed with puree Compensations Slow rate;Small sips/bites;Chin tuck Postural Changes Remain semi-upright after after feeds/meals (Comment)   CHL IP OTHER RECOMMENDATIONS 06/18/2018 Recommended Consults -- Oral Care Recommendations Oral care BID Other Recommendations --   CHL IP FOLLOW UP RECOMMENDATIONS 06/18/2018 Follow up Recommendations (No Data)   CHL IP FREQUENCY AND DURATION 06/18/2018 Speech Therapy Frequency (ACUTE ONLY) min 2x/week Treatment Duration 2 weeks      CHL IP ORAL PHASE 06/18/2018 Oral Phase Impaired Oral - Pudding Teaspoon -- Oral - Pudding Cup -- Oral - Honey Teaspoon -- Oral -  Honey Cup -- Oral - Nectar Teaspoon -- Oral - Nectar Cup Delayed oral transit;Lingual/palatal residue Oral - Nectar Straw -- Oral - Thin Teaspoon Delayed oral transit;Lingual/palatal residue Oral - Thin Cup Lingual/palatal residue;Delayed oral transit Oral - Thin Straw -- Oral - Puree Lingual/palatal residue;Delayed oral transit Oral - Mech Soft -- Oral - Regular Delayed oral transit;Lingual/palatal residue Oral - Multi-Consistency -- Oral - Pill -- Oral Phase - Comment --  CHL IP PHARYNGEAL PHASE 06/18/2018 Pharyngeal Phase Impaired Pharyngeal- Pudding Teaspoon -- Pharyngeal -- Pharyngeal- Pudding Cup -- Pharyngeal -- Pharyngeal- Honey Teaspoon -- Pharyngeal -- Pharyngeal- Honey Cup -- Pharyngeal -- Pharyngeal- Nectar Teaspoon -- Pharyngeal -- Pharyngeal- Nectar Cup Delayed swallow initiation-vallecula Pharyngeal -- Pharyngeal- Nectar Straw -- Pharyngeal -- Pharyngeal- Thin Teaspoon Delayed swallow initiation-vallecula;Reduced laryngeal elevation;Reduced anterior laryngeal mobility Pharyngeal -- Pharyngeal- Thin Cup Delayed swallow initiation-pyriform sinuses;Reduced laryngeal elevation;Reduced anterior laryngeal mobility;Penetration/Aspiration before swallow;Reduced airway/laryngeal closure;Pharyngeal residue - valleculae Pharyngeal Material enters airway, remains ABOVE vocal cords and not ejected out Pharyngeal- Thin Straw -- Pharyngeal -- Pharyngeal- Puree Delayed swallow initiation-vallecula Pharyngeal -- Pharyngeal- Mechanical Soft -- Pharyngeal -- Pharyngeal- Regular Delayed swallow initiation-vallecula Pharyngeal -- Pharyngeal- Multi-consistency -- Pharyngeal -- Pharyngeal- Pill -- Pharyngeal -- Pharyngeal Comment --  No flowsheet data found. Shelly Flatten, MA, CCC-SLP Acute Rehab SLP [phone] Lamar Sprinkles 06/18/2018, 12:35 PM                  Subjective: Patient is feeling well, no chest pain or dyspnea, continue to be very weak and deconditioned. Had episodes of confusion over last 24 hours.    Discharge Exam: Vitals:   06/20/18 0743 06/20/18 1512  BP:  137/73  Pulse:  77  Resp:  (!) 22  Temp:  98.7 F (37.1 C)  SpO2: 94% 94%   Vitals:   06/19/18 2023 06/20/18 0520 06/20/18 0743 06/20/18 1512  BP:  133/61  137/73  Pulse:  72  77  Resp:  (!) 22  (!) 22  Temp:  97.6 F (36.4 C)  98.7 F (37.1 C)  TempSrc:  Oral  Oral  SpO2: 93% 93% 94% 94%  Weight:      Height:        General: Not in pain or dyspnea, deconditioned  Neurology: Awake and alert, non focal  E ENT: mild pallor, no icterus, oral mucosa moist Cardiovascular: No JVD. S1-S2 present, rhythmic, no gallops, rubs, or murmurs. No lower extremity edema. Pulmonary: positive breath sounds bilaterally, adequate air movement, no wheezing, rhonchi or rales. (anrterior auscultation) Gastrointestinal. Abdomen protuberant with no organomegaly, non tender, no rebound or guarding Skin. No rashes Musculoskeletal: no joint deformities   The results of significant diagnostics from this hospitalization (including imaging, microbiology, ancillary and laboratory) are listed below for reference.     Microbiology: Recent Results (from the past 240 hour(s))  Culture,  blood (Routine x 2)     Status: None (Preliminary result)   Collection Time: 06/17/18  3:54 PM  Result Value Ref Range Status   Specimen Description   Final    BLOOD LEFT ANTECUBITAL Performed at [facility] [address] [place], [place] [address]    Special Requests   Final    BOTTLES DRAWN AEROBIC AND ANAEROBIC Blood Culture adequate volume Performed at [facility] [address] [place], [place] [address]    Culture   Final    NO GROWTH 3 DAYS Performed at [facility], [place] [address] [place], [place] [address]    Report Status PENDING  Incomplete  Culture, blood (Routine x 2)     Status: None (Preliminary result)   Collection Time: 06/17/18  3:59 PM  Result Value Ref Range Status   Specimen  Description   Final    BLOOD RIGHT ANTECUBITAL Performed at [facility] [address], [place], [place] [address]    Special Requests   Final    BOTTLES DRAWN AEROBIC AND ANAEROBIC Blood Culture adequate volume Performed at [facility] [address], [place], [place] [address]    Culture   Final    NO GROWTH 3 DAYS Performed at [facility], [place] [address] [place], [place] [address]    Report Status PENDING  Incomplete  MRSA PCR Screening     Status: None   Collection Time: 06/18/18  5:35 AM  Result Value Ref Range Status   MRSA by PCR NEGATIVE NEGATIVE Final    Comment:        The GeneXpert MRSA Assay (FDA approved for NASAL specimens only), is one component of a comprehensive MRSA colonization surveillance program. It is not intended to diagnose MRSA infection nor to guide or monitor treatment for MRSA infections. Performed at [facility], [place] [address] [place], [place] [address]      Labs: BNP (last 3 results) No results for input(s): BNP in the last 8760 hours. Basic Metabolic Panel: Recent Labs  Lab 06/17/18 1554 06/18/18 0528 06/19/18 0524 06/20/18 0541  NA 139 140 141 140  K 4.0 3.1* 3.6 3.7  CL 102 103 102 101  CO2 22 30 30 29   GLUCOSE 136* 114* 123* 109*  BUN 29* 21 20 19   CREATININE 1.01* 0.74 0.80 0.79  CALCIUM 10.0 9.9 10.0 10.2   Liver Function Tests: Recent Labs  Lab 06/17/18 1554  AST 34  ALT 26  ALKPHOS 71  BILITOT 0.8  PROT 7.4  ALBUMIN 3.9   No results for input(s): LIPASE, AMYLASE in the last 168 hours. No results for input(s): AMMONIA in the last 168 hours. CBC: Recent Labs  Lab 06/17/18 1554 06/18/18 0528 06/19/18 0524 06/20/18 0541  WBC 13.7* 10.0 7.3 7.3  NEUTROABS 10.7*  --  3.8 3.5  HGB 12.9 11.2* 11.3* 12.2  HCT 39.9 34.9* 35.3* 38.0  MCV 93.0 92.1 92.4 92.9  PLT 315 314 330 373   Cardiac Enzymes: No results for input(s): CKTOTAL, CKMB,  CKMBINDEX, TROPONINI in the last 168 hours. BNP: Invalid input(s): POCBNP CBG: No results for input(s): GLUCAP in the last 168 hours. D-Dimer No results for input(s): DDIMER in the last 72 hours. Hgb A1c No results for input(s): HGBA1C in the last 72 hours. Lipid Profile No results for input(s): CHOL, HDL, LDLCALC, TRIG, CHOLHDL, LDLDIRECT in the last 72 hours. Thyroid function studies No results  for input(s): TSH, T4TOTAL, T3FREE, THYROIDAB in the last 72 hours.  Invalid input(s): FREET3 Anemia work up No results for input(s): VITAMINB12, FOLATE, FERRITIN, TIBC, IRON, RETICCTPCT in the last 72 hours. Urinalysis    Component Value Date/Time   COLORURINE YELLOW 06/17/2018 1554   APPEARANCEUR CLEAR 06/17/2018 1554   LABSPEC 1.016 06/17/2018 1554   PHURINE 5.0 06/17/2018 1554   GLUCOSEU NEGATIVE 06/17/2018 1554   HGBUR SMALL (A) 06/17/2018 1554   BILIRUBINUR NEGATIVE 06/17/2018 1554   KETONESUR NEGATIVE 06/17/2018 1554   PROTEINUR NEGATIVE 06/17/2018 1554   UROBILINOGEN 0.2 08/12/2015 2047   NITRITE NEGATIVE 06/17/2018 1554   LEUKOCYTESUR NEGATIVE 06/17/2018 1554   Sepsis Labs Invalid input(s): PROCALCITONIN,  WBC,  LACTICIDVEN Microbiology Recent Results (from the past 240 hour(s))  Culture, blood (Routine x 2)     Status: None (Preliminary result)   Collection Time: 06/17/18  3:54 PM  Result Value Ref Range Status   Specimen Description   Final    BLOOD LEFT ANTECUBITAL Performed at [facility], [place] [address], [place], [place] [address]    Special Requests   Final    BOTTLES DRAWN AEROBIC AND ANAEROBIC Blood Culture adequate volume Performed at [facility] [address], [place], [place] [address]    Culture   Final    NO GROWTH 3 DAYS Performed at [facility], [place] [address], [place], [place] [address]    Report Status PENDING  Incomplete  Culture, blood (Routine x 2)     Status: None (Preliminary result)    Collection Time: 06/17/18  3:59 PM  Result Value Ref Range Status   Specimen Description   Final    BLOOD RIGHT ANTECUBITAL Performed at [facility] [address] [place], [place] [address]    Special Requests   Final    BOTTLES DRAWN AEROBIC AND ANAEROBIC Blood Culture adequate volume Performed at [facility] [address], [place], [place] [address]    Culture   Final    NO GROWTH 3 DAYS Performed at [facility], [place] [address] [place], [place] [address]    Report Status PENDING  Incomplete  MRSA PCR Screening     Status: None   Collection Time: 06/18/18  5:35 AM  Result Value Ref Range Status   MRSA by PCR NEGATIVE NEGATIVE Final    Comment:        The GeneXpert MRSA Assay (FDA approved for NASAL specimens only), is one component of a comprehensive MRSA colonization surveillance program. It is not intended to diagnose MRSA infection nor to guide or monitor treatment for MRSA infections. Performed at [facility], [place] [address], [place], [place] [address]      Time coordinating discharge: 45 minutes  SIGNED:   Tawni Millers, MD  [facility] 06/20/2018, 3:12 PM Pager [phone]  If 7PM-7AM, please contact night-coverage [email or web address] Password [email or web address]

## 2018-06-21 MED ORDER — DILTIAZEM HCL 60 MG PO TABS
60.0000 mg | ORAL_TABLET | Freq: Four times a day (QID) | ORAL | Status: DC
  Administered 2018-06-21 – 2018-06-22 (×5): 60 mg via ORAL
  Filled 2018-06-21 (×9): qty 1

## 2018-06-21 MED ORDER — DILTIAZEM HCL 60 MG PO TABS: 60.0000 mg | ORAL_TABLET | Freq: Four times a day (QID) | ORAL | 0 refills | Status: DC

## 2018-06-21 MED ORDER — POLYETHYLENE GLYCOL 3350 17 G PO PACK
17.0000 g | PACK | Freq: Every day | ORAL | Status: DC
  Administered 2018-06-21 – 2018-06-22 (×2): 17 g via ORAL
  Filled 2018-06-21 (×2): qty 1

## 2018-06-21 MED ORDER — CARBIDOPA-LEVODOPA 10-100MG/5ML ORAL SUSPENSION: 5.0000 mL | Freq: Three times a day (TID) | ORAL | Status: DC

## 2018-06-21 MED ORDER — CARBIDOPA-LEVODOPA 25-100 MG PO TABS
1.0000 | ORAL_TABLET | Freq: Three times a day (TID) | ORAL | Status: DC
  Administered 2018-06-21 – 2018-06-22 (×4): 1 via ORAL
  Filled 2018-06-21 (×4): qty 1

## 2018-06-21 MED ORDER — PANTOPRAZOLE SODIUM 40 MG PO PACK
40.0000 mg | PACK | Freq: Every day | ORAL | Status: DC
  Administered 2018-06-22: 40 mg via ORAL
  Filled 2018-06-21: qty 20

## 2018-06-21 MED ORDER — PANTOPRAZOLE SODIUM 40 MG PO PACK: 40.0000 mg | PACK | Freq: Every day | ORAL | 0 refills | Status: DC

## 2018-06-21 MED ORDER — CARBIDOPA-LEVODOPA 25-100 MG PO TABS: 1.0000 | ORAL_TABLET | Freq: Three times a day (TID) | ORAL | 0 refills | Status: DC

## 2018-06-21 NOTE — Progress Notes (Signed)
  Speech Language Pathology Treatment: Dysphagia  Patient Details Name: [name] MRN: [id number] DOB: [birth year] Today's Date: 06/21/2018 Time: 5093-2671 SLP Time Calculation (min) (ACUTE ONLY): 12 min  Assessment / Plan / Recommendation Clinical Impression  Pt for D/C to SNF today.  Has demonstrated chronic dysphagia that waxes and wanes - presents today with good toleration of regular diet with thin liquids; no overt s/s of aspiration, no c/o ongoing coughing/choking.  Reviewed basic precautions with pt/daughter, including use of chin tuck with liquids, particularly if symptoms recur. Pt able to discuss precautions and their value.  No further f/u warranted - our services will sign off.  HPI HPI: Jasmine Andy Robertsonis a 84 y.o.femalewith medical history significant ofParkinson's disease and ambulatory dysfunction, wheelchair-bound. She was noted to have generalized weakness, malaise and poor appetite over the last 5 days. She was diagnosed with urine tract infection 24 hours after symptoms started and received ciprofloxacin 48 hours after her symptoms started.Despite antibiotic therapy she continued to deteriorate andtoday she was found febrile, dyspneic and ill looking appearing. She was transferred to the hospital for furtherevaluation. On direct questioning patient mentions experiencing worsening dysphagia for the last 5 days, persistent, associated with severe coughing and choking mainly postprandial,no improving or worsening factors. Denies dyspnea but positive wheezing. She has been in a wheelchair for last few years due to her Parkinson's disease. In the outpatient clinic itwas suggested that she hasswallow dysfunction and a swallow evaluation was planned.  Most recent CXR is showing progression of bibasilar airspace disease.        SLP Plan  All goals met       Recommendations  Diet recommendations: Regular;Thin liquid Liquids provided via:  Cup;Straw Medication Administration: Whole meds with puree Supervision: Patient able to self feed Compensations: Slow rate;Small sips/bites                Plan: All goals met       GO                [name] 06/21/2018, 10:03 AM

## 2018-06-21 NOTE — Progress Notes (Signed)
PROGRESS NOTE    [name]  IZT:[id number] DOB: [birth year] DOA: 06/17/2018 PCP: Jacklyn Shell, FNP    Brief Narrative:  84 year old female with advanced Parkinson's disease who presents with altered mentation and fever, symptoms were persistent despite antibiotic therapy with ciprofloxacin. She does mention worsening dysphagia with symptoms of aspiration,for the last 5 days. On the initial physical examination her rectal temperature was 100.5, blood pressure 142/64, heart rate 88, respiratory rate 29, oxygen saturation 98%.Her lugs had expiratory wheezing and scattered rales, heart S1-S2 present and rhythmic, abdomen soft nontender, trace lower extremity edema. She does have rigidity but otherwise nonfocal, she is awake, alert and oriented. Sodium 139, potassium 4.0, chloride 102, bicarb 22, glucose 136, creatinine 1.0, venous lactic acid 3.4, white count 13.7, hemoglobin 12.9, hematocrit 39.9, platelets 315.Urine analysis specific gravity 1.016, 0-5 white cells, 0-5 redcells.ChestX-ray with increased lung markings bilaterally, no infiltrates.  Patient will be admitted to the hospital with the working diagnosis of metabolic encephalopathy due to aspiration pneumonitis,to rule outpneumonia withsepsis.    Assessment & Plan:   Active Problems:   Metabolic encephalopathy   Aspiration pneumonitis (HCC)   Sepsis ([place])   Altered mental status   1.Aspiration pneumonitis/ viral pneumonia complicated with sepsis ad acute hypoxic respiratory failure. Present on admission.Patient doing well off antibiotic therapy, has remained afebrile. Oxygenating 97% on room air. Continue aspiration precautions.   2.Advanced Parkinson's disease with ambulatory dysfunction.Sinemet 3 times daily, duloxetine, alprazolamand melatonin.Pending physical therapy at SNF.   3.Hypertension.Changediltiazem to q 6 hours so can be crushed, continue  losartan 25 mg bedtime.    4.Dyslipidemia.Continue atorvastatin.  5.History of CVA with acute metabolic encephalopathy.Onatorvastatin and antiplatelet therapy withclopidogrel.   6. Hypokalemia due to dehydration. Off IV fluids, continue to encourage po intake.  7. Unspecified obesity. On nutritional supplements per nutrition recommendation.   DVT prophylaxis:enoxaparin Code Status:full Family Communication:no family at the bedside   Disposition Plan/ discharge barriers:SNF when bed available.   Consultants:    Procedures:    Antimicrobials:  Subjective: Patient is feeling well, continue to be very weak and deconditioned, no dyspnea.   Objective: Vitals:   06/20/18 2027 06/21/18 0505 06/21/18 0901 06/21/18 1401  BP: (!) 146/68 134/68  (!) 150/79  Pulse: 74 74  82  Resp: (!) 22   18  Temp: 99.8 F (37.7 C) 98.4 F (36.9 C)  98.3 F (36.8 C)  TempSrc: Oral   Oral  SpO2: 94% 96% 93% 97%  Weight:      Height:        Intake/Output Summary (Last 24 hours) at 06/21/2018 1551 Last data filed at 06/21/2018 1430 Gross per 24 hour  Intake 240 ml  Output 2350 ml  Net -2110 ml   Filed Weights   06/17/18 1553  Weight: 81.6 kg    Examination:   General: deconditioned  Neurology: Awake and alert, non focal  E ENT: no pallor, no icterus, oral mucosa moist Cardiovascular: No JVD. S1-S2 present, rhythmic, no gallops, rubs, or murmurs. No lower extremity edema. Pulmonary: positive breath sounds bilaterally, adequate air movement, no wheezing, rhonchi or rales. Gastrointestinal. Abdomen protuberant no organomegaly, non tender, no rebound or guarding Skin. No rashes Musculoskeletal: no joint deformities     Data Reviewed: I have personally reviewed following labs and imaging studies  CBC: Recent Labs  Lab 06/17/18 1554 06/18/18 0528 06/19/18 0524 06/20/18 0541  WBC 13.7* 10.0 7.3 7.3  NEUTROABS 10.7*  --  3.8 3.5  HGB 12.9 11.2* 11.3* 12.2  HCT 39.9  34.9* 35.3* 38.0  MCV 93.0 92.1 92.4 92.9  PLT 315 314 330 998   Basic Metabolic Panel: Recent Labs  Lab 06/17/18 1554 06/18/18 0528 06/19/18 0524 06/20/18 0541  NA 139 140 141 140  K 4.0 3.1* 3.6 3.7  CL 102 103 102 101  CO2 22 30 30 29   GLUCOSE 136* 114* 123* 109*  BUN 29* 21 20 19   CREATININE 1.01* 0.74 0.80 0.79  CALCIUM 10.0 9.9 10.0 10.2   GFR: Estimated Creatinine Clearance: 47.7 mL/min (by C-G formula based on SCr of 0.79 mg/dL). Liver Function Tests: Recent Labs  Lab 06/17/18 1554  AST 34  ALT 26  ALKPHOS 71  BILITOT 0.8  PROT 7.4  ALBUMIN 3.9   No results for input(s): LIPASE, AMYLASE in the last 168 hours. No results for input(s): AMMONIA in the last 168 hours. Coagulation Profile: Recent Labs  Lab 06/17/18 1554  INR 1.06   Cardiac Enzymes: No results for input(s): CKTOTAL, CKMB, CKMBINDEX, TROPONINI in the last 168 hours. BNP (last 3 results) No results for input(s): PROBNP in the last 8760 hours. HbA1C: No results for input(s): HGBA1C in the last 72 hours. CBG: No results for input(s): GLUCAP in the last 168 hours. Lipid Profile: No results for input(s): CHOL, HDL, LDLCALC, TRIG, CHOLHDL, LDLDIRECT in the last 72 hours. Thyroid Function Tests: No results for input(s): TSH, T4TOTAL, FREET4, T3FREE, THYROIDAB in the last 72 hours. Anemia Panel: No results for input(s): VITAMINB12, FOLATE, FERRITIN, TIBC, IRON, RETICCTPCT in the last 72 hours.    Radiology Studies: I have reviewed all of the imaging during this hospital visit personally     Scheduled Meds: . acetaminophen  500 mg Oral TID  . ALPRAZolam  0.5 mg Oral TID  . atorvastatin  10 mg Oral QHS  . carbidopa-levodopa  1 tablet Oral Q8H  . cholecalciferol  5,000 Units Oral Q1200  . clopidogrel  75 mg Oral Q1200  . diltiazem  60 mg Oral Q6H  . DULoxetine  30 mg Oral Daily  . enoxaparin (LOVENOX) injection  40 mg Subcutaneous Q24H  . ipratropium-albuterol  3 mL Nebulization BID  .  latanoprost  1 drop Both Eyes QHS  . losartan  25 mg Oral QHS  . Melatonin  3 mg Oral QHS  . montelukast  10 mg Oral Q1200  . multivitamin with minerals  1 tablet Oral Daily  . MUSCLE RUB   Topical TID  . oxyCODONE  2.5 mg Oral BID  . [START ON 06/22/2018] pantoprazole sodium  40 mg Oral Daily  . polyethylene glycol  17 g Oral Daily  . protein supplement shake  11 oz Oral Q24H   Continuous Infusions:   LOS: 4 days        Mauricio Gerome Apley, MD [facility] Pager [phone]

## 2018-06-21 NOTE — Clinical Social Work Placement (Addendum)
   Patient has bed at [facility].  LCSW confirmed bed with facility. Room 104  LCSW notified family at bedside.  LCSW faxed dc documents to facility.   PTAR has been arranged.   RN report #: [id number]  BKJ  CLINICAL SOCIAL WORK PLACEMENT  NOTE  Date:  06/21/2018  Patient Details  Name: [name] MRN: [id number] Date of Birth: [birth year]  Clinical Social Work is seeking post-discharge placement for this patient at the [place] level of care (*CSW will initial, date and re-position this form in  chart as items are completed):  Yes   Patient/family provided with [place] Work Department's list of facilities offering this level of care within the geographic area requested by the patient (or if unable, by the patient's family).  Yes   Patient/family informed of their freedom to choose among providers that offer the needed level of care, that participate in Medicare, Medicaid or managed care program needed by the patient, have an available bed and are willing to accept the patient.  Yes   Patient/family informed of [facility]'s ownership interest in [facility] and [facility], as well as of the fact that they are under no obligation to receive care at these facilities.  PASRR submitted to EDS on       PASRR number received on 06/20/18     Existing PASRR number confirmed on       FL2 transmitted to all facilities in geographic area requested by pt/family on 06/20/18     FL2 transmitted to all facilities within larger geographic area on       Patient informed that his/her managed care company has contracts with or will negotiate with certain facilities, including the following:        Yes   Patient/family informed of bed offers received.  Patient chooses bed at [place], [place]     Physician recommends and patient chooses bed at      Patient to be transferred to [place], [place] on 06/21/18.  Patient to  be transferred to facility by EMS     Patient family notified on 06/21/18 of transfer.  Name of family member notified:  Margarita Grizzle     PHYSICIAN       Additional Comment:    _______________________________________________ Servando Snare, LCSW 06/21/2018, 2:25 PM

## 2018-06-21 NOTE — Progress Notes (Signed)
LCSW following for SNF placement.   Patient is awaiting insurance auth. Facility started [employer] yesterday. LCSW followed up with facility. Awaiting response.  [name] [place]

## 2018-06-22 DIAGNOSIS — J690 Pneumonitis due to inhalation of food and vomit: Secondary | ICD-10-CM

## 2018-06-22 DIAGNOSIS — I10 Essential (primary) hypertension: Secondary | ICD-10-CM

## 2018-06-22 DIAGNOSIS — G9341 Metabolic encephalopathy: Secondary | ICD-10-CM

## 2018-06-22 LAB — CULTURE, BLOOD (ROUTINE X 2)
CULTURE: NO GROWTH
Culture: NO GROWTH
Special Requests: ADEQUATE
Special Requests: ADEQUATE

## 2018-06-22 MED ORDER — POLYETHYLENE GLYCOL 3350 17 G PO PACK: 17.0000 g | PACK | Freq: Every day | ORAL | 0 refills | Status: DC

## 2018-06-22 MED ORDER — BENZONATATE 100 MG PO CAPS: 100.0000 mg | ORAL_CAPSULE | Freq: Three times a day (TID) | ORAL | 0 refills | Status: DC | PRN

## 2018-06-22 MED ORDER — BENZONATATE 100 MG PO CAPS: 100.0000 mg | ORAL_CAPSULE | Freq: Three times a day (TID) | ORAL | Status: DC | PRN

## 2018-06-22 NOTE — Progress Notes (Signed)
Transported off unit at 2005 via [facility] daughter notified

## 2018-06-22 NOTE — Progress Notes (Signed)
PROGRESS NOTE    [name]  BUL:[id number] DOB: 09/30/1930 DOA: 06/17/2018 PCP: Jacklyn Shell, FNP    Brief Narrative:  84 year old female with advanced Parkinson's disease who presents with altered mentation and fever, symptoms were persistent despite antibiotic therapy with ciprofloxacin. She does mention worsening dysphagiawith symptoms of aspiration,for the last 5 days. On the initial physical examination her rectal temperature was 100.5, blood pressure 142/64, heart rate 88, respiratory rate 29, oxygen saturation 98%.Her lugs hadexpiratory wheezing and scattered rales, heart S1-S2 present and rhythmic, abdomen soft nontender, trace lower extremity edema. She does have rigidity but otherwise nonfocal, she is awake, alert and oriented. Sodium 139, potassium 4.0, chloride 102, bicarb 22, glucose 136, creatinine 1.0, venous lactic acid 3.4, white count 13.7, hemoglobin 12.9, hematocrit 39.9, platelets 315.Urine analysis specific gravity 1.016, 0-5 white cells, 0-5 redcells.ChestX-ray with increased lung markings bilaterally, no infiltrates.  Patient will be admitted to the hospital with the working diagnosis of metabolic encephalopathy due to aspiration pneumonitis,to rule outpneumonia withsepsis.      Assessment & Plan:   Active Problems:   Metabolic encephalopathy   Aspiration pneumonitis (HCC)   Sepsis ([place])   Altered mental status  1.Aspiration pneumonitis/ viralpneumoniacomplicated withsepsisad acute hypoxic respiratory failure. Present on admission.Patient doing well off antibiotic therapy, has remained afebrile. Oxygenating 97% on room air. Continue aspiration precautions.   2.Advanced Parkinson's disease with ambulatory dysfunction.Continue current regimen of Sinemet 3 times daily, duloxetine, alprazolamand melatonin.Pending physical therapy at SNF.   3.Hypertension. Continue current regimen ofdiltiazem to q 6 hours so can be  crushed.  4.Dyslipidemia.Continue statin.  5.History of CVA with acute metabolic encephalopathy. Stable.  Continue statin and clopidogrel.   6. Hypokalemia due to dehydration.  Continue IVF.   7. Unspecified obesity. Continue current nuitritional supplementations.    DVT prophylaxis: Lovenox Code Status: Full Family Communication: Updated patient.  No family present. Disposition Plan: Discharge patient back to skilled nursing facility when bed available.   Consultants:   None  Procedures:   Chest x-ray 06/17/2018, 06/18/2018    Antimicrobials:   IV Rocephin 06/17/2018>>>>> 06/19/2018   Subjective: Patient laying in bed.  Denies any chest pain no shortness of breath.  Does have some occasional cough.  Feels much better otherwise.  Objective: Vitals:   06/21/18 1945 06/21/18 2004 06/22/18 0422 06/22/18 0813  BP:  134/65 (!) 170/84   Pulse:  84 82   Resp:  17 17   Temp:  98.2 F (36.8 C) (!) 97.5 F (36.4 C)   TempSrc:      SpO2: 93% 93% 95% 94%  Weight:      Height:        Intake/Output Summary (Last 24 hours) at 06/22/2018 1008 Last data filed at 06/22/2018 3212 Gross per 24 hour  Intake -  Output 1650 ml  Net -1650 ml   Filed Weights   06/17/18 1553  Weight: 81.6 kg    Examination:  General exam: Appears calm and comfortable  Respiratory system: Clear to auscultation. Respiratory effort normal. Cardiovascular system: S1 & S2 heard, RRR. No JVD, murmurs, rubs, gallops or clicks. No pedal edema. Gastrointestinal system: Abdomen is nondistended, soft and nontender. No organomegaly or masses felt. Normal bowel sounds heard. Central nervous system: Alert and oriented. No focal neurological deficits. Extremities: Symmetric 5 x 5 power. Skin: No rashes, lesions or ulcers Psychiatry: Judgement and insight appear normal. Mood & affect appropriate.     Data Reviewed: I have personally reviewed following labs and imaging  studies  CBC: Recent Labs  Lab 06/17/18 1554 06/18/18 0528 06/19/18 0524 06/20/18 0541  WBC 13.7* 10.0 7.3 7.3  NEUTROABS 10.7*  --  3.8 3.5  HGB 12.9 11.2* 11.3* 12.2  HCT 39.9 34.9* 35.3* 38.0  MCV 93.0 92.1 92.4 92.9  PLT 315 314 330 353   Basic Metabolic Panel: Recent Labs  Lab 06/17/18 1554 06/18/18 0528 06/19/18 0524 06/20/18 0541  NA 139 140 141 140  K 4.0 3.1* 3.6 3.7  CL 102 103 102 101  CO2 22 30 30 29   GLUCOSE 136* 114* 123* 109*  BUN 29* 21 20 19   CREATININE 1.01* 0.74 0.80 0.79  CALCIUM 10.0 9.9 10.0 10.2   GFR: Estimated Creatinine Clearance: 47.7 mL/min (by C-G formula based on SCr of 0.79 mg/dL). Liver Function Tests: Recent Labs  Lab 06/17/18 1554  AST 34  ALT 26  ALKPHOS 71  BILITOT 0.8  PROT 7.4  ALBUMIN 3.9   No results for input(s): LIPASE, AMYLASE in the last 168 hours. No results for input(s): AMMONIA in the last 168 hours. Coagulation Profile: Recent Labs  Lab 06/17/18 1554  INR 1.06   Cardiac Enzymes: No results for input(s): CKTOTAL, CKMB, CKMBINDEX, TROPONINI in the last 168 hours. BNP (last 3 results) No results for input(s): PROBNP in the last 8760 hours. HbA1C: No results for input(s): HGBA1C in the last 72 hours. CBG: No results for input(s): GLUCAP in the last 168 hours. Lipid Profile: No results for input(s): CHOL, HDL, LDLCALC, TRIG, CHOLHDL, LDLDIRECT in the last 72 hours. Thyroid Function Tests: No results for input(s): TSH, T4TOTAL, FREET4, T3FREE, THYROIDAB in the last 72 hours. Anemia Panel: No results for input(s): VITAMINB12, FOLATE, FERRITIN, TIBC, IRON, RETICCTPCT in the last 72 hours. Sepsis Labs: Recent Labs  Lab 06/17/18 1607 06/17/18 1900 06/18/18 0528  PROCALCITON  --   --  0.12  LATICACIDVEN 3.43* 2.68*  --     Recent Results (from the past 240 hour(s))  Culture, blood (Routine x 2)     Status: None (Preliminary result)   Collection Time: 06/17/18  3:54 PM  Result Value Ref Range Status    Specimen Description   Final    BLOOD LEFT ANTECUBITAL Performed at [facility], [place] [address], [place], [place] [address]    Special Requests   Final    BOTTLES DRAWN AEROBIC AND ANAEROBIC Blood Culture adequate volume Performed at [place] [address] [place], [place] [address]    Culture   Final    NO GROWTH 4 DAYS Performed at [facility], [place] [address] [place], [place] [address]    Report Status PENDING  Incomplete  Culture, blood (Routine x 2)     Status: None (Preliminary result)   Collection Time: 06/17/18  3:59 PM  Result Value Ref Range Status   Specimen Description   Final    BLOOD RIGHT ANTECUBITAL Performed at [place] [address], [place], [place] [address]    Special Requests   Final    BOTTLES DRAWN AEROBIC AND ANAEROBIC Blood Culture adequate volume Performed at [facility] [address], [place], [place] [address]    Culture   Final    NO GROWTH 4 DAYS Performed at [facility], [place] [address] [place],  [address]    Report Status PENDING  Incomplete  MRSA PCR Screening     Status: None   Collection Time: 06/18/18  5:35 AM  Result Value Ref Range Status   MRSA by PCR NEGATIVE NEGATIVE Final    Comment:        The GeneXpert MRSA Assay (FDA approved for NASAL specimens only), is one component of a comprehensive MRSA colonization surveillance program. It is not intended to diagnose MRSA infection nor to guide or monitor treatment for MRSA infections. Performed at [facility], [place] [address] [place], [place] [address]          Radiology Studies: No results found.      Scheduled Meds: . acetaminophen  500 mg Oral TID  . ALPRAZolam  0.5 mg Oral TID  . atorvastatin  10 mg Oral QHS  . carbidopa-levodopa  1 tablet Oral Q8H  . cholecalciferol  5,000 Units Oral Q1200  . clopidogrel  75 mg Oral Q1200  .  diltiazem  60 mg Oral Q6H  . DULoxetine  30 mg Oral Daily  . enoxaparin (LOVENOX) injection  40 mg Subcutaneous Q24H  . ipratropium-albuterol  3 mL Nebulization BID  . latanoprost  1 drop Both Eyes QHS  . losartan  25 mg Oral QHS  . Melatonin  3 mg Oral QHS  . montelukast  10 mg Oral Q1200  . multivitamin with minerals  1 tablet Oral Daily  . MUSCLE RUB   Topical TID  . oxyCODONE  2.5 mg Oral BID  . pantoprazole sodium  40 mg Oral Daily  . polyethylene glycol  17 g Oral Daily  . protein supplement shake  11 oz Oral Q24H   Continuous Infusions:   LOS: 5 days    Time spent: 35 mins    Irine Seal, MD [facility] Pager [phone]  If 7PM-7AM, please contact night-coverage [email or web address] Password [email or web address] 06/22/2018, 10:08 AM

## 2018-06-22 NOTE — Care Management Note (Signed)
Case Management Note  Patient Details  Name: [name] MRN: [id number] Date of Birth: 11-25-1929  Subjective/Objective:                  Afebrile/    Metabolic encephalopathy   Aspiration pneumonitis ([place])   Sepsis ([place])   Altered mental status  Action/Plan: expect snf placement due to multiple falls and confusion-csw working on this Cm needs none present at this time.  Expected Discharge Date:  06/20/18               Expected Discharge Plan:     In-House Referral:  Clinical Social Work  Discharge planning Services  CM Consult  Post Acute Care Choice:    Choice offered to:     DME Arranged:    DME Agency:     HH Arranged:    HH Agency:     Status of Service:  In process, will continue to follow  If discussed at Long Length of Stay Meetings, dates discussed:    Additional Comments:  Leeroy Cha, RN 06/22/2018, 10:19 AM

## 2018-06-22 NOTE — Progress Notes (Signed)
Report called to [facility]. Pt IV removed. Pt dressed in personal clothing. PTAR to transport.

## 2018-06-22 NOTE — Progress Notes (Signed)
Physical Therapy Treatment Patient Details Name: [name] MRN: [id number] DOB: 1929/11/06 Today's Date: 06/22/2018    History of Present Illness 84yo female who has a history of being WC bound with complaints of weakness, malaise. Recently diagnosed with UTI and treated, but continues to deteriorate. Diagnosed with aspiration pneumonitis, and with plan to r/o sepsis. PMH anxiety, CVA, glaucoma, macular degeneration, Parkinsons, TIA, loop recorder implant, frequent falls with multiple old fractures R UE     PT Comments    Pt feeling "sleepy".  AxO x 3 and pleasant.  Has a "bad" R shoulder (do not pull).  Pt stated she was able to transfer to her wc but has not walked for some time.  Assisted to EOB required Max assist using bed pad to complete scooting to EOB.  Once upright, pt was able to static sit x 6 min at Super[facility] while waiting for + 2 help.  Pt c/o that she has not had a BM in several days.  Assisted from elevated bed to [facility] + 2 assist.  Pt did have a small hard stool.  Assisted with hygiene then transfer to recliner.  Max move pad in recliner for nursing staff to use lift to assist pt back to bed.  Pt will need ST Rehab at SNF to regain prior level of transfer ability.    Follow Up Recommendations  SNF     Equipment Recommendations       Recommendations for Other Services       Precautions / Restrictions Precautions Precautions: Fall Precaution Comments: "bad" R shoulder Restrictions Weight Bearing Restrictions: No    Mobility  Bed Mobility Overal bed mobility: Needs Assistance Bed Mobility: Sit to Supine     Supine to sit: Max assist;Total assist;+2 for physical assistance;+2 for safety/equipment     General bed mobility comments: great difficulty due to weakness and "bad" R shoulder      used bed pad to complete scooting to EOB  Transfers Overall transfer level: Needs assistance Equipment used: None Transfers: Sit to/from [employer] Sit to Stand: Max assist;+2 physical assistance;+2 safety/equipment Stand pivot transfers: Total assist;+2 physical assistance;+2 safety/equipment       General transfer comment: assisted from elevated bed to 1/4 pivot to [facility] and again from [facility] to recliner.  Pt required increased assist to complete pivot.    Ambulation/Gait             General Gait Details: prior level of mobility is wheelchair   Stairs             Wheelchair Mobility    Modified Rankin (Stroke Patients Only)       Balance                                            Cognition Arousal/Alertness: Awake/alert Behavior During Therapy: WFL for tasks assessed/performed Overall Cognitive Status: Within Functional Limits for tasks assessed                                 General Comments: sweet      Exercises      General Comments        Pertinent Vitals/Pain Pain Assessment: No/denies pain    Home Living  Prior Function            PT Goals (current goals can now be found in the care plan section) Progress towards PT goals: Progressing toward goals    Frequency    Min 2X/week      PT Plan Current plan remains appropriate    Co-evaluation              AM-PAC PT "6 Clicks" Daily Activity  Outcome Measure  Difficulty turning over in bed (including adjusting bedclothes, sheets and blankets)?: Unable Difficulty moving from lying on back to sitting on the side of the bed? : Unable Difficulty sitting down on and standing up from a chair with arms (e.g., wheelchair, bedside commode, etc,.)?: Unable Help needed moving to and from a bed to chair (including a wheelchair)?: Total Help needed walking in hospital room?: Total Help needed climbing 3-5 steps with a railing? : Total 6 Click Score: 6    End of Session Equipment Utilized During Treatment: Gait belt Activity Tolerance: Patient limited by  fatigue Patient left: in chair;with call bell/phone within reach Nurse Communication: Mobility status;Need for lift equipment PT Visit Diagnosis: Unsteadiness on feet (R26.81);Muscle weakness (generalized) (M62.81);History of falling (Z91.81);Other abnormalities of gait and mobility (R26.89)     Time: 0051-1021 PT Time Calculation (min) (ACUTE ONLY): 33 min  Charges:  $Therapeutic Activity: 23-37 mins                     Rica Koyanagi  PTA [facility]  Acute  Rehab Pager      [phone]

## 2018-07-01 ENCOUNTER — Non-Acute Institutional Stay (SKILLED_NURSING_FACILITY): Payer: Medicare Other | Admitting: Internal Medicine

## 2018-07-01 ENCOUNTER — Encounter: Payer: Self-pay | Admitting: Internal Medicine

## 2018-07-01 DIAGNOSIS — I10 Essential (primary) hypertension: Secondary | ICD-10-CM

## 2018-07-01 DIAGNOSIS — E785 Hyperlipidemia, unspecified: Secondary | ICD-10-CM

## 2018-07-01 DIAGNOSIS — J9601 Acute respiratory failure with hypoxia: Secondary | ICD-10-CM | POA: Diagnosis not present

## 2018-07-01 DIAGNOSIS — J129 Viral pneumonia, unspecified: Secondary | ICD-10-CM | POA: Diagnosis not present

## 2018-07-01 DIAGNOSIS — J690 Pneumonitis due to inhalation of food and vomit: Secondary | ICD-10-CM | POA: Diagnosis not present

## 2018-07-01 DIAGNOSIS — E1169 Type 2 diabetes mellitus with other specified complication: Secondary | ICD-10-CM

## 2018-07-01 DIAGNOSIS — E119 Type 2 diabetes mellitus without complications: Secondary | ICD-10-CM

## 2018-07-01 DIAGNOSIS — A419 Sepsis, unspecified organism: Secondary | ICD-10-CM | POA: Diagnosis not present

## 2018-07-01 DIAGNOSIS — E876 Hypokalemia: Secondary | ICD-10-CM

## 2018-07-01 DIAGNOSIS — R269 Unspecified abnormalities of gait and mobility: Secondary | ICD-10-CM

## 2018-07-01 DIAGNOSIS — G9341 Metabolic encephalopathy: Secondary | ICD-10-CM

## 2018-07-01 DIAGNOSIS — F32A Depression, unspecified: Secondary | ICD-10-CM

## 2018-07-01 DIAGNOSIS — G20 Parkinson's disease: Secondary | ICD-10-CM

## 2018-07-01 DIAGNOSIS — F329 Major depressive disorder, single episode, unspecified: Secondary | ICD-10-CM

## 2018-07-01 NOTE — Progress Notes (Signed)
:    Location:  [facility] Room Number: 157W Place of Service:  SNF (31)  Anne D. [name], MD  Patient Care Team: Jacklyn Shell, FNP as PCP - General (Nurse Practitioner) Marylynn Pearson, MD as Referring Physician (Internal Medicine)  Extended Emergency Contact Information Primary Emergency Contact: Macky [place] Phone: [phone] Mobile Phone: [phone] Relation: Son Secondary Emergency Contact: [place] Mobile Phone: [phone] Relation: Daughter     Allergies: Comtan [entacapone]; Mirapex [pramipexole dihydrochloride]; Sulfa antibiotics; and Ciprofloxacin  Chief Complaint  Patient presents with  . New Admit To SNF    Admit to [facility]    HPI: Patient is 84 y.o. female with advanced Parkinson's disease, hypertension hyperlipidemia, history of CVA and type 2 diabetes mellitus who was admitted to skilled nursing facility on 8/28 from another skilled nursing facility after being hospitalized at [facility] long hospital from 8/16-19.  Patient had presented with altered mental status and fever despite therapy with ciprofloxacin.  She also mention worsening dysphasia with symptoms of aspiration for the prior 5 days.  Patient was admitted to the hospital for aspiration pneumonitis complicated with sepsis and acute hypoxic respiratory failure.  Patient was treated with IV Rocephin and chest x-ray with a faint right base infiltrate, no leukocytosis no fever blood cultures no growth.  Antibiotic therapy was discontinued and patient was able to be weaned off O2.  Patient was admitted to skilled nursing facility for OT/PT and is now admitted to [facility] facility for the same.  While [place] patient will be followed for Parkinson's disease treated with Sinemet, hypertension treated with diltiazem and losartan and history of CVA treated with Plavix.  Past Medical History:  Diagnosis Date  .  Abnormality of gait 02/05/2015  . Anxiety   . Arthritis   . Asthma   . Cerebrovascular disease    CVA '03  . Depression   . Dyslipidemia   . Endometriosis   . GERD (gastroesophageal reflux disease)   . Glaucoma   . Headache(784.0)   . Hypertension   . Macular degeneration    bilateral  . Obesity   . Paralysis agitans ([place]) 08/31/2013  . Parkinson disease ([place])   . TIA (transient ischemic attack)    Multiple    Past Surgical History:  Procedure Laterality Date  . ABDOMINAL HYSTERECTOMY    . CATARACT EXTRACTION, BILATERAL    . GALLBLADDER SURGERY    . LOOP RECORDER IMPLANT  05-28-14   MDT LINQ implanted by Dr Caryl Comes for cryptogenic stroke/palpitations  . LOOP RECORDER IMPLANT N/A 05/28/2014   Procedure: LOOP RECORDER IMPLANT;  Surgeon: Deboraha Sprang, MD;  Location: [facility] C[facility] LAB;  Service: Cardiovascular;  Laterality: N/A;  . PILONIDAL CYST RESECTION    . TILT TABLE STUDY  05-28-14   orthostatic hypertension  . TILT TABLE STUDY N/A 05/28/2014   Procedure: TILT TABLE STUDY;  Surgeon: Deboraha Sprang, MD;  Location[facility] C[facility] LAB;  Service: Cardiovascular;  Laterality: N/A;  . TONSILLECTOMY      Allergies as of 2020/08/1818      Reactions   Comtan [entacapone] Other (See Comments)   Reaction:  Unknown    Mirapex [pramipexole Dihydrochloride] Other (See Comments)   Reaction:  Unknown    Sulfa Antibiotics Nausea And Vomiting   Ciprofloxacin Other (See Comments)   Reaction:  Made pt feel crazy       Medication List  Accurate as of 07/01/18  8:55 AM. Always use your most recent med list.          Acetaminophen 500 MG coapsule Take 500 mg by mouth 3 (three) times daily.   ALPRAZolam 0.5 MG tablet Commonly known as:  XANAX Take 1 tablet (0.5 mg total) by mouth 3 (three) times daily.   atorvastatin 10 MG tablet Commonly known as:  LIPITOR Take 10 mg by mouth at bedtime.   benzonatate 100 MG capsule Commonly known as:  TESSALON Take 1 capsule (100 mg total) by mouth  3 (three) times daily as needed for cough.   BIOFREEZE 4 % Gel Generic drug:  Menthol (Topical Analgesic) Apply 1 application topically 3 (three) times daily.   carbidopa-levodopa 25-100 MG tablet Commonly known as:  SINEMET IR Take 1 tablet by mouth every 8 (eight) hours.   clopidogrel 75 MG tablet Commonly known as:  PLAVIX Take 75 mg by mouth daily at 12 noon.   diltiazem 60 MG tablet Commonly known as:  CARDIZEM Take 1 tablet (60 mg total) by mouth every 6 (six) hours.   DULoxetine 30 MG capsule Commonly known as:  CYMBALTA Take 30 mg by mouth daily.   glucose blood test strip 1 each by Other route as needed for other ([place]). Use as instructed   losartan 25 MG tablet Commonly known as:  COZAAR Take 25 mg by mouth at bedtime.   Melatonin 3 MG Caps Take 3 mg by mouth at bedtime.   metFORMIN 500 MG tablet Commonly known as:  GLUCOPHAGE Take 500 mg by mouth 2 (two) times daily before a meal.   montelukast 10 MG tablet Commonly known as:  SINGULAIR Take 10 mg by mouth daily at 12 noon.   oxymetazoline 0.05 % nasal spray Commonly known as:  AFRIN Place 1 spray into both nostrils as needed (for nose bleeds).   pantoprazole sodium 40 mg/20 mL Pack Commonly known as:  PROTONIX Take 20 mLs (40 mg total) by mouth daily.   polyethylene glycol packet Commonly known as:  MIRALAX / GLYCOLAX Take 17 g by mouth daily.   protein supplement shake Liqd Commonly known as:  PREMIER PROTEIN Take 325 mLs (11 oz total) by mouth daily.   TAB-A-VITE Tabs Take 1 tablet by mouth daily at 12 noon.   Travoprost (BAK Free) 0.004 % Soln ophthalmic solution Commonly known as:  TRAVATAN Place 1 drop into both eyes at bedtime.   VENTOLIN HFA 108 (90 Base) MCG/ACT inhaler Generic drug:  albuterol Inhale 2 puffs into the lungs every 6 (six) hours as needed for wheezing or shortness of breath.   Vitamin D3 5000 units Caps Take 5,000 Units  by mouth daily at 12 noon.       No orders of the defined types were placed in this encounter.    There is no immunization history on file for this patient.  Social History   Tobacco Use  . Smoking status: Never Smoker  . Smokeless tobacco: Never Used  Substance Use Topics  . Alcohol use: No    Family history is   Family History  Problem Relation Age of Onset  . Heart attack Mother   . Arthritis Brother   . Skin cancer Brother       Review of Systems  DATA OBTAINED: from patient, nurse GENERAL:  no fevers, fatigue, appetite changes SKIN: No itching, or rash EYES: No eye pain, redness, discharge EARS: No earache, tinnitus, change  in hearing NOSE: No congestion, drainage or bleeding  MOUTH/THROAT: No mouth or tooth pain, No sore throat RESPIRATORY: No cough, wheezing, SOB CARDIAC: No chest pain, palpitations, lower extremity edema  GI: No abdominal pain, No N/V/D or constipation, No heartburn or reflux  GU: No dysuria, frequency or urgency, or incontinence  MUSCULOSKELETAL: No unrelieved bone/joint pain NEUROLOGIC: No headache, dizziness or focal weakness PSYCHIATRIC: No c/o anxiety or sadness   Vitals:   07/01/18 0854  BP: 135/77  Pulse: 84  Resp: 19  Temp: 98 F (36.7 C)    SpO2 Readings from Last 1 Encounters:  06/22/18 94%   Body mass index is 34.96 kg/m.     Physical Exam  GENERAL APPEARANCE: Alert, moderately conversant,  No acute distress.  SKIN: No diaphoresis rash HEAD: Normocephalic, atraumatic  EYES: Conjunctiva/lids clear. Pupils round, reactive. EOMs intact.  EARS: External exam WNL, canals clear. Hearing grossly normal.  NOSE: No deformity or discharge.  MOUTH/THROAT: Lips w/o lesions  RESPIRATORY: Breathing is even, unlabored. Lung sounds are clear   CARDIOVASCULAR: Heart RRR no murmurs, rubs or gallops. No peripheral edema.   GASTROINTESTINAL: Abdomen is soft, non-tender, not distended w/ normal bowel sounds. GENITOURINARY:  Bladder non tender, not distended  MUSCULOSKELETAL: No abnormal joints or musculature NEUROLOGIC:  Cranial nerves 2-12 grossly intact. Moves all extremities  PSYCHIATRIC: Mood and affect appropriate to situation, no behavioral issues  Patient Active Problem List   Diagnosis Date Noted  . Essential hypertension   . Sepsis ([place])   . Altered mental status   . Metabolic encephalopathy 92/95/7473  . Aspiration pneumonitis ([place]) 06/17/2018  . Tachycardia 08/12/2015  . Abnormality of gait 02/05/2015  . Palpitations 05/28/2014  . TIA (transient ischemic attack) 05/25/2014  . Dyslipidemia 05/25/2014  . Parkinson disease ([place]) 05/25/2014  . H/O: CVA (cerebrovascular accident) 05/25/2014  . Paralysis agitans ([place]) 08/31/2013  . Dizziness and giddiness 08/31/2013      Labs reviewed: Basic Metabolic Panel:    Component Value Date/Time   NA 140 06/20/2018 0541   K 3.7 06/20/2018 0541   CL 101 06/20/2018 0541   CO2 29 06/20/2018 0541   GLUCOSE 109 (H) 06/20/2018 0541   BUN 19 06/20/2018 0541   CREATININE 0.79 06/20/2018 0541   CALCIUM 10.2 06/20/2018 0541   PROT 7.4 06/17/2018 1554   ALBUMIN 3.9 06/17/2018 1554   AST 34 06/17/2018 1554   ALT 26 06/17/2018 1554   ALKPHOS 71 06/17/2018 1554   BILITOT 0.8 06/17/2018 1554   GFRNONAA >60 06/20/2018 0541   GFRAA >60 06/20/2018 0541    Recent Labs    06/18/18 0528 06/19/18 0524 06/20/18 0541  NA 140 141 140  K 3.1* 3.6 3.7  CL 103 102 101  CO2 30 30 29   GLUCOSE 114* 123* 109*  BUN 21 20 19   CREATININE 0.74 0.80 0.79  CALCIUM 9.9 10.0 10.2   Liver Function Tests: Recent Labs    06/17/18 1554  AST 34  ALT 26  ALKPHOS 71  BILITOT 0.8  PROT 7.4  ALBUMIN 3.9   No results for input(s): LIPASE, AMYLASE in the last 8760 hours. No results for input(s): AMMONIA in the last 8760 hours. CBC: Recent Labs    06/17/18 1554 06/18/18 0528 06/19/18 0524 06/20/18 0541  WBC 13.7* 10.0 7.3 7.3  NEUTROABS 10.7*  --  3.8 3.5    HGB 12.9 11.2* 11.3* 12.2  HCT 39.9 34.9* 35.3* 38.0  MCV 93.0 92.1 92.4 92.9  PLT 315 314 330 373  Lipid No results for input(s): CHOL, HDL, LDLCALC, TRIG in the last 8760 hours.  Cardiac Enzymes: No results for input(s): CKTOTAL, CKMB, CKMBINDEX, TROPONINI in the last 8760 hours. BNP: No results for input(s): BNP in the last 8760 hours. No results found for: [facility] Lab Results  Component Value Date   HGBA1C 6.2 (H) 05/26/2014   Lab Results  Component Value Date   TSH 2.770 05/26/2014   Lab Results  Component Value Date   HQPRFFMB84 665 05/26/2014   No results found for: FOLATE No results found for: IRON, TIBC, FERRITIN  Imaging and Procedures obtained prior to SNF admission: Dg Chest 2 View  Result Date: 06/18/2018 CLINICAL DATA:  Dyspnea EXAM: CHEST - 2 VIEW COMPARISON:  06/17/2018 FINDINGS: Mild cardiac enlargement.  Cardiac loop recorder. Progression of bibasilar airspace disease which may be atelectasis or pneumonia. No effusion or edema IMPRESSION: Progression bibasilar airspace disease. Electronically Signed   By: Franchot Gallo M.D.   On: 06/18/2018 09:32   Dg Chest 2 View  Result Date: 06/17/2018 CLINICAL DATA:  Patient BIB per EMS from [facility]. Pt has had altered mental status for x 2 days and a concussion 1 week ago. Pt has had SOB on and off with increased lethargy and tachypnea per EMS. Hx of UTI-being treated with antibiotics.*comment was truncated* EXAM: CHEST - 2 VIEW COMPARISON:  08/12/2015 FINDINGS: Normal cardiac silhouette. No effusion, infiltrate pneumothorax. Mild central venous congestion. Remote RIGHT humerus fracture. Degenerative osteophytosis of the spine. IMPRESSION: No acute cardiopulmonary process. Electronically Signed   By: Suzy Bouchard M.D.   On: 06/17/2018 16:27   Dg Swallowing Func-speech Pathology  Result Date: 06/18/2018 Objective Swallowing Evaluation: Type of Study: MBS-Modified Barium Swallow Study  Patient Details Name:  FREDRICKA KOHRS MRN: [id number] Date of Birth: Oct 28, 1930 Today's Date: 06/18/2018 Time: SLP Start Time (ACUTE ONLY): 79 -SLP Stop Time (ACUTE ONLY): 1145 SLP Time Calculation (min) (ACUTE ONLY): 30 min Past Medical History: Past Medical History: Diagnosis Date . Abnormality of gait 02/05/2015 . Anxiety  . Arthritis  . Asthma  . Cerebrovascular disease   CVA '03 . Depression  . Dyslipidemia  . Endometriosis  . GERD (gastroesophageal reflux disease)  . Glaucoma  . Headache(784.0)  . Hypertension  . Macular degeneration   bilateral . Obesity  . Paralysis agitans ([place]) 08/31/2013 . Parkinson disease ([place])  . TIA (transient ischemic attack)   Multiple Past Surgical History: Past Surgical History: Procedure Laterality Date . ABDOMINAL HYSTERECTOMY   . CATARACT EXTRACTION, BILATERAL   . GALLBLADDER SURGERY   . LOOP RECORDER IMPLANT  05-28-14  MDT LINQ implanted by Dr Caryl Comes for cryptogenic stroke/palpitations . LOOP RECORDER IMPLANT N/A 05/28/2014  Procedure: LOOP RECORDER IMPLANT;  Surgeon: Deboraha Sprang, MD;  Location: [facility] CATH LAB;  Service: Cardiovascular;  Laterality: N/A; . PILONIDAL CYST RESECTION   . TILT TABLE STUDY  05-28-14  orthostatic hypertension . TILT TABLE STUDY N/A 05/28/2014  Procedure: TILT TABLE STUDY;  Surgeon: Deboraha Sprang, MD;  Location[facility] CATH LAB;  Service: Cardiovascular;  Laterality: N/A; . TONSILLECTOMY   HPI: Marvena Tally Robertsonis a 84 y.o.femalewith medical history significant ofParkinson's disease and ambulatory dysfunction, wheelchair-bound. She was noted to have generalized weakness, malaise and poor appetite over the last 5 days. She was diagnosed with urine tract infection 24 hours after symptoms started and received ciprofloxacin 48 hours after her symptoms started.Despite antibiotic therapy she continued to deteriorate andtoday she was found febrile, dyspneic and ill looking appearing. She was  transferred to the hospital for furtherevaluation. On direct questioning patient  mentions experiencing worsening dysphagia for the last 5 days, persistent, associated with severe coughing and choking mainly postprandial,no improving or worsening factors. Denies dyspnea but positive wheezing. She has been in a wheelchair for last few years due to her Parkinson's disease. In the outpatient clinic itwas suggested that she hasswallow dysfunction and a swallow evaluation was planned.  Most recent CXR is showing progression of bibasilar airspace disease.   Subjective: The patient was seen in radiology for MBS. Assessment / Plan / Recommendation CHL IP CLINICAL IMPRESSIONS 06/18/2018 Clinical Impression MBS was completed using thin liquids, nectar thick liquids, pureed material and dry solids.  The patient presented with a mild  oropharyngeal dysphagia.  The oral phase was marked by decreased lingual motion that led to delayed oral transit and oral residue.  She generally displayed good oral control of the bolus but intermittently had some issues with material falling into the buccal area.  The pharyngeal phase was marked by decreased hyo-laryngeal elevation and anterior movement, decreased laryngeal vestibule closure, mildly reduced base of tongue retraction and prominence of the cricopharyngeus.  She had intermittent mild residue at the vallecula.  Penetration into the laryngeal vestibule was seen prior to the swallow given self fed cup sips of thin liquids.   Cued cough and reswallow were successful to clear material.  Chin tuck was also successful to prevent.  Esophageal sweep did not reveal overt issues.  Recommend a regular diet with thin liquids.  Patient should tuck her chin with liquids.  Use of a straw facilitated use of chin tuck.  If patient is unable to use a chin tuck recommend using nectar thick liquids.  ST will follow for therapeutic diet tolerance and to initiate swallowing therapy.  She would benefit from ST follow up at next level of care.   SLP Visit Diagnosis Dysphagia,  oropharyngeal phase (R13.12) Attention and concentration deficit following -- Frontal lobe and executive function deficit following -- Impact on safety and function Mild aspiration risk   CHL IP TREATMENT RECOMMENDATION 06/18/2018 Treatment Recommendations Therapy as outlined in treatment plan below   Prognosis 06/18/2018 Prognosis for Safe Diet Advancement Fair Barriers to Reach Goals -- Barriers/Prognosis Comment -- CHL IP DIET RECOMMENDATION 06/18/2018 SLP Diet Recommendations Regular solids;Thin liquid Liquid Administration via Straw Medication Administration Crushed with puree Compensations Slow rate;Small sips/bites;Chin tuck Postural Changes Remain semi-upright after after feeds/meals (Comment)   CHL IP OTHER RECOMMENDATIONS 06/18/2018 Recommended Consults -- Oral Care Recommendations Oral care BID Other Recommendations --   CHL IP FOLLOW UP RECOMMENDATIONS 06/18/2018 Follow up Recommendations (No Data)   CHL IP FREQUENCY AND DURATION 06/18/2018 Speech Therapy Frequency (ACUTE ONLY) min 2x/week Treatment Duration 2 weeks      CHL IP ORAL PHASE 06/18/2018 Oral Phase Impaired Oral - Pudding Teaspoon -- Oral - Pudding Cup -- Oral - Honey Teaspoon -- Oral - Honey Cup -- Oral - Nectar Teaspoon -- Oral - Nectar Cup Delayed oral transit;Lingual/palatal residue Oral - Nectar Straw -- Oral - Thin Teaspoon Delayed oral transit;Lingual/palatal residue Oral - Thin Cup Lingual/palatal residue;Delayed oral transit Oral - Thin Straw -- Oral - Puree Lingual/palatal residue;Delayed oral transit Oral - Mech Soft -- Oral - Regular Delayed oral transit;Lingual/palatal residue Oral - Multi-Consistency -- Oral - Pill -- Oral Phase - Comment --  CHL IP PHARYNGEAL PHASE 06/18/2018 Pharyngeal Phase Impaired Pharyngeal- Pudding Teaspoon -- Pharyngeal -- Pharyngeal- Pudding Cup -- Pharyngeal -- Pharyngeal- Honey Teaspoon -- Pharyngeal --  Pharyngeal- Honey Cup -- Pharyngeal -- Pharyngeal- Nectar Teaspoon -- Pharyngeal -- Pharyngeal- Nectar Cup  Delayed swallow initiation-vallecula Pharyngeal -- Pharyngeal- Nectar Straw -- Pharyngeal -- Pharyngeal- Thin Teaspoon Delayed swallow initiation-vallecula;Reduced laryngeal elevation;Reduced anterior laryngeal mobility Pharyngeal -- Pharyngeal- Thin Cup Delayed swallow initiation-pyriform sinuses;Reduced laryngeal elevation;Reduced anterior laryngeal mobility;Penetration/Aspiration before swallow;Reduced airway/laryngeal closure;Pharyngeal residue - valleculae Pharyngeal Material enters airway, remains ABOVE vocal cords and not ejected out Pharyngeal- Thin Straw -- Pharyngeal -- Pharyngeal- Puree Delayed swallow initiation-vallecula Pharyngeal -- Pharyngeal- Mechanical Soft -- Pharyngeal -- Pharyngeal- Regular Delayed swallow initiation-vallecula Pharyngeal -- Pharyngeal- Multi-consistency -- Pharyngeal -- Pharyngeal- Pill -- Pharyngeal -- Pharyngeal Comment --  No flowsheet data found. Shelly Flatten, MA, CCC-SLP Acute Rehab SLP [phone] Lamar Sprinkles 06/18/2018, 12:35 PM                Not all labs, radiology exams or other studies done during hospitalization come through on my EPIC note; however they are reviewed by me.    Assessment and Plan  Aspiration pneumonitis/viral pneumonia/sepsis/acute respiratory failure with hypoxia/metabolic encephalopathy- chest x-ray had faint right base infiltrate, no leukocytosis no fever blood cultures no growth procalcitonin low, clinical picture consistent with viral pneumonia or aspiration pneumonitis and antibiotic therapy, IV Rocephin was discontinued.  Patient was weaned off oxygen onto room air with an O2 sat of 94%; patient had a formal swallow eval with recommendation for aspiration precautions, regular solids with thin liquids medications to be crushed with pure SNF -patient is admitted for OT/PT  Hypokalemia secondary to dehydration-corrected with IV fluids and potassium; discharge creatinine 7.9 discharge potassium 3.7 SNF -follow-up  BMP  Advanced Parkinson's disease/ambulatory dysfunction SNF -admitted for OT/PT; continue Sinemet 25-101 p.o. every 8 hours  Hypertension SNF -controlled; continue diltiazem 60 mg every 6 hours and losartan 25 mg nightly  Hyperlipidemia SNF -not stated as uncontrolled; continue Lipitor 10 mg nightly  Diabetes mellitus type 2- not stated as uncontrolled continue Glucophage 500 mg twice daily; patient is on ARB and statin  Depression SNF -appears controlled; continue Cymbalta 30 mg daily   Time spent greater than 45 minutes;> 50% of time with patient was spent reviewing records, labs, tests and studies, counseling and developing plan of care  Webb Silversmith D. [name], MD

## 2018-07-03 ENCOUNTER — Encounter: Payer: Self-pay | Admitting: Internal Medicine

## 2018-07-03 DIAGNOSIS — E876 Hypokalemia: Secondary | ICD-10-CM | POA: Insufficient documentation

## 2018-07-03 DIAGNOSIS — J129 Viral pneumonia, unspecified: Secondary | ICD-10-CM | POA: Insufficient documentation

## 2018-07-03 DIAGNOSIS — J9601 Acute respiratory failure with hypoxia: Secondary | ICD-10-CM | POA: Insufficient documentation

## 2018-07-03 DIAGNOSIS — E1149 Type 2 diabetes mellitus with other diabetic neurological complication: Secondary | ICD-10-CM | POA: Insufficient documentation

## 2018-07-03 DIAGNOSIS — F32A Depression, unspecified: Secondary | ICD-10-CM | POA: Insufficient documentation

## 2018-07-03 DIAGNOSIS — F329 Major depressive disorder, single episode, unspecified: Secondary | ICD-10-CM | POA: Insufficient documentation

## 2018-07-26 ENCOUNTER — Encounter: Payer: Self-pay | Admitting: Internal Medicine

## 2018-07-26 ENCOUNTER — Non-Acute Institutional Stay (SKILLED_NURSING_FACILITY): Payer: Medicare Other | Admitting: Internal Medicine

## 2018-07-26 DIAGNOSIS — I10 Essential (primary) hypertension: Secondary | ICD-10-CM | POA: Diagnosis not present

## 2018-07-26 DIAGNOSIS — F329 Major depressive disorder, single episode, unspecified: Secondary | ICD-10-CM

## 2018-07-26 DIAGNOSIS — F32A Depression, unspecified: Secondary | ICD-10-CM

## 2018-07-26 DIAGNOSIS — E119 Type 2 diabetes mellitus without complications: Secondary | ICD-10-CM

## 2018-07-26 NOTE — Progress Notes (Signed)
Location:  [place] Room Number: 353G Place of Service:  SNF (31)  [name]. Jasmine Coil, MD  Patient Care Team: Jacklyn Shell, FNP as PCP - General (Nurse Practitioner) Jasmine Pearson, MD as Referring Physician (Internal Medicine)  Extended Emergency Contact Information Primary Emergency Contact: [name] [place] Phone: [phone] Mobile Phone: [phone] Relation: Son Secondary Emergency Contact: [name] Mobile Phone: [phone] Relation: Daughter    Allergies: Comtan [entacapone]; Mirapex [pramipexole dihydrochloride]; Sulfa antibiotics; and Ciprofloxacin  Chief Complaint  Patient presents with  . Medical Management of Chronic Issues    Routine Visit    HPI: Patient is 84 y.o. female who is being seen for routine issues of depression, diabetes mellitus type 2, and hypertension.  Past Medical History:  Diagnosis Date  . Abnormality of gait 02/05/2015  . Anxiety   . Arthritis   . Asthma   . Cerebrovascular disease    CVA '03  . Depression   . Dyslipidemia   . Endometriosis   . GERD (gastroesophageal reflux disease)   . Glaucoma   . Headache(784.0)   . Hypertension   . Macular degeneration    bilateral  . Obesity   . Paralysis agitans ([place]) 08/31/2013  . Parkinson disease ([place])   . TIA (transient ischemic attack)    Multiple    Past Surgical History:  Procedure Laterality Date  . ABDOMINAL HYSTERECTOMY    . CATARACT EXTRACTION, BILATERAL    . GALLBLADDER SURGERY    . LOOP RECORDER IMPLANT  05-28-14   MDT LINQ implanted by Dr Caryl Comes for cryptogenic stroke/palpitations  . LOOP RECORDER IMPLANT N/A 05/28/2014   Procedure: LOOP RECORDER IMPLANT;  Surgeon: Deboraha Sprang, MD;  Location: [facility] CATH LAB;  Service: Cardiovascular;  Laterality: N/A;  . PILONIDAL CYST RESECTION    . TILT TABLE STUDY  05-28-14   orthostatic hypertension  . TILT TABLE STUDY N/A 05/28/2014   Procedure: TILT  TABLE STUDY;  Surgeon: Deboraha Sprang, MD;  Location: [facility] CATH LAB;  Service: Cardiovascular;  Laterality: N/A;  . TONSILLECTOMY      Allergies as of 07/26/2018      Reactions   Comtan [entacapone] Other (See Comments)   Reaction:  Unknown    Mirapex [pramipexole Dihydrochloride] Other (See Comments)   Reaction:  Unknown    Sulfa Antibiotics Nausea And Vomiting   Ciprofloxacin Other (See Comments)   Reaction:  Made pt feel crazy       Medication List        Accurate as of 07/26/18 11:59 PM. Always use your most recent med list.          Acetaminophen 500 MG coapsule Take 500 mg by mouth 3 (three) times daily.   ALPRAZolam 0.5 MG tablet Commonly known as:  XANAX Take 1 tablet (0.5 mg total) by mouth 3 (three) times daily.   atorvastatin 10 MG tablet Commonly known as:  LIPITOR Take 10 mg by mouth at bedtime.   BIOFREEZE 4 % Gel Generic drug:  Menthol (Topical Analgesic) Apply 1 application topically 3 (three) times daily.   carbidopa-levodopa 25-100 MG tablet Commonly known as:  SINEMET IR Take 1 tablet by mouth every 8 (eight) hours.   clopidogrel 75 MG tablet Commonly known as:  PLAVIX Take 75 mg by mouth daily at 12 noon.   diltiazem 60 MG tablet Commonly known as:  CARDIZEM Take 1 tablet (60 mg total) by mouth every 6 (six) hours.  DULoxetine 30 MG capsule Commonly known as:  CYMBALTA Take 30 mg by mouth daily.   glucose blood test strip 1 each by Other route as needed for other ([place]). Use as instructed   losartan 25 MG tablet Commonly known as:  COZAAR Take 25 mg by mouth at bedtime.   Melatonin 3 MG Caps Take 3 mg by mouth at bedtime.   metFORMIN 500 MG tablet Commonly known as:  GLUCOPHAGE Take 500 mg by mouth 2 (two) times daily before a meal.   montelukast 10 MG tablet Commonly known as:  SINGULAIR Take 10 mg by mouth daily at 12 noon.   oxymetazoline 0.05 % nasal spray Commonly known as:   AFRIN Place 1 spray into both nostrils as needed (for nose bleeds).   pantoprazole sodium 40 mg/20 mL Pack Commonly known as:  PROTONIX Take 20 mLs (40 mg total) by mouth daily.   polyethylene glycol packet Commonly known as:  MIRALAX / GLYCOLAX Take 17 g by mouth daily.   TAB-A-VITE Tabs Take 1 tablet by mouth daily at 12 noon.   Travoprost (BAK Free) 0.004 % Soln ophthalmic solution Commonly known as:  TRAVATAN Place 1 drop into both eyes at bedtime.   VENTOLIN HFA 108 (90 Base) MCG/ACT inhaler Generic drug:  albuterol Inhale 2 puffs into the lungs every 6 (six) hours as needed for wheezing or shortness of breath.   Vitamin D3 5000 units Caps Take 5,000 Units by mouth daily at 12 noon.       No orders of the defined types were placed in this encounter.    There is no immunization history on file for this patient.  Social History   Tobacco Use  . Smoking status: Never Smoker  . Smokeless tobacco: Never Used  Substance Use Topics  . Alcohol use: No    Review of Systems  DATA OBTAINED: from patient, nurse GENERAL:  no fevers, fatigue, appetite changes SKIN: No itching, rash HEENT: No complaint RESPIRATORY: No cough, wheezing, SOB CARDIAC: No chest pain, palpitations, Abbott extremity edema  GI: No abdominal pain, No N/V/D or constipation, No heartburn or reflux  GU: No dysuria, frequency or urgency, or incontinence  MUSCULOSKELETAL: No unrelieved bone/joint pain NEUROLOGIC: No headache, dizziness  PSYCHIATRIC: No overt anxiety or sadness  Vitals:   07/26/18 1319  BP: 133/85  Pulse: 77  Resp: 20  Temp: (!) 97.2 F (36.2 C)  SpO2: 96%   Body mass index is 34.96 kg/m. Physical Exam  GENERAL APPEARANCE: Alert, conversant, No acute distress  SKIN: No diaphoresis rash HEENT: Unremarkable RESPIRATORY: Breathing is even, unlabored. Lung sounds are clear   CARDIOVASCULAR: Heart RRR no murmurs, rubs or gallops. No peripheral edema  GASTROINTESTINAL:  Abdomen is soft, non-tender, not distended w/ normal bowel sounds.  GENITOURINARY: Bladder non tender, not distended  MUSCULOSKELETAL: No abnormal joints or musculature NEUROLOGIC: Cranial nerves 2-12 grossly intact. Moves all extremities PSYCHIATRIC: Mood and affect appropriate to situation, no behavioral issues  Patient Active Problem List   Diagnosis Date Noted  . Viral pneumonia 07/03/2018  . Acute respiratory failure with hypoxia ([place]) 07/03/2018  . Hypokalemia 07/03/2018  . Diabetes mellitus type II, controlled ([place]) 07/03/2018  . Depression 07/03/2018  . Essential hypertension   . Sepsis ([place])   . Altered mental status   . Metabolic encephalopathy 76/73/4193  . Aspiration pneumonitis ([place]) 06/17/2018  . Tachycardia 08/12/2015  . Abnormality of gait 02/05/2015  . Palpitations 05/28/2014  . TIA (transient  ischemic attack) 05/25/2014  . Hyperlipidemia associated with type 2 diabetes mellitus ([place]) 05/25/2014  . Parkinson disease ([place]) 05/25/2014  . H/O: CVA (cerebrovascular accident) 05/25/2014  . Paralysis agitans ([place]) 08/31/2013  . Dizziness and giddiness 08/31/2013    CMP     Component Value Date/Time   NA 140 06/20/2018 0541   K 3.7 06/20/2018 0541   CL 101 06/20/2018 0541   CO2 29 06/20/2018 0541   GLUCOSE 109 (H) 06/20/2018 0541   BUN 19 06/20/2018 0541   CREATININE 0.79 06/20/2018 0541   CALCIUM 10.2 06/20/2018 0541   PROT 7.4 06/17/2018 1554   ALBUMIN 3.9 06/17/2018 1554   AST 34 06/17/2018 1554   ALT 26 06/17/2018 1554   ALKPHOS 71 06/17/2018 1554   BILITOT 0.8 06/17/2018 1554   GFRNONAA >60 06/20/2018 0541   GFRAA >60 06/20/2018 0541   Recent Labs    06/18/18 0528 06/19/18 0524 06/20/18 0541  NA 140 141 140  K 3.1* 3.6 3.7  CL 103 102 101  CO2 30 30 29   GLUCOSE 114* 123* 109*  BUN 21 20 19   CREATININE 0.74 0.80 0.79  CALCIUM 9.9 10.0 10.2   Recent Labs    06/17/18 1554  AST 34  ALT 26  ALKPHOS 71  BILITOT 0.8  PROT 7.4  ALBUMIN 3.9    Recent Labs    06/17/18 1554 06/18/18 0528 06/19/18 0524 06/20/18 0541  WBC 13.7* 10.0 7.3 7.3  NEUTROABS 10.7*  --  3.8 3.5  HGB 12.9 11.2* 11.3* 12.2  HCT 39.9 34.9* 35.3* 38.0  MCV 93.0 92.1 92.4 92.9  PLT 315 314 330 373   No results for input(s): CHOL, LDLCALC, TRIG in the last 8760 hours.  Invalid input(s): HCL No results found for: [facility] Lab Results  Component Value Date   TSH 2.770 05/26/2014   Lab Results  Component Value Date   HGBA1C 6.2 (H) 05/26/2014   Lab Results  Component Value Date   CHOL 176 05/26/2014   HDL 33 (L) 05/26/2014   LDLCALC 99 05/26/2014   TRIG 220 (H) 05/26/2014   CHOLHDL 5.3 05/26/2014    Significant Diagnostic Results in last 30 days:  No results found.  Assessment and Plan  Depression Appears relatively controlled; continue Cymbalta 30 mg daily  Diabetes mellitus type II, controlled ([place]) Patient newly transitioned to long-term care; last A1c 6.2 was 4 years ago continue Glucophage 500 mg twice daily and obtain new A1c; patient is on ARB and on statin  Essential hypertension Controlled; continue diltiazem 60 mg every 6 and 25 mg daily    Lesly Joslyn D. Jasmine Coil, MD

## 2018-07-31 ENCOUNTER — Encounter: Payer: Self-pay | Admitting: Internal Medicine

## 2018-07-31 NOTE — Assessment & Plan Note (Signed)
Patient newly transitioned to long-term care; last A1c 6.2 was 4 years ago continue Glucophage 500 mg twice daily and obtain new A1c; patient is on ARB and on statin

## 2018-07-31 NOTE — Assessment & Plan Note (Signed)
Appears relatively controlled; continue Cymbalta 30 mg daily

## 2018-07-31 NOTE — Assessment & Plan Note (Signed)
Controlled; continue diltiazem 60 mg every 6 and 25 mg daily

## 2018-08-08 LAB — CBC AND DIFFERENTIAL
HCT: 34 — AB (ref 36–46)
HCT: 34 — AB (ref 36–46)
HEMOGLOBIN: 11.4 — AB (ref 12.0–16.0)
Hemoglobin: 11.4 — AB (ref 12.0–16.0)
PLATELETS: 354 (ref 150–399)
Platelets: 354 (ref 150–399)
WBC: 8.5
WBC: 8.5

## 2018-08-08 LAB — HEPATIC FUNCTION PANEL
ALK PHOS: 91 (ref 25–125)
ALT: 26 (ref 7–35)
AST: 22 (ref 13–35)
Bilirubin, Total: 0.3

## 2018-08-08 LAB — BASIC METABOLIC PANEL
BUN: 20 (ref 4–21)
Creatinine: 0.7 (ref 0.5–1.1)
Glucose: 113
POTASSIUM: 4 (ref 3.4–5.3)
SODIUM: 142 (ref 137–147)

## 2018-08-08 LAB — LIPID PANEL
Cholesterol: 167 (ref 0–200)
HDL: 41 (ref 35–70)
LDL CALC: 71
Triglycerides: 277 — AB (ref 40–160)

## 2018-08-08 LAB — VITAMIN D 25 HYDROXY (VIT D DEFICIENCY, FRACTURES): Vit D, 25-Hydroxy: >60

## 2018-08-08 LAB — TSH: TSH: 3.09 (ref 0.41–5.90)

## 2018-08-08 LAB — HEMOGLOBIN A1C: HEMOGLOBIN A1C: 6.2 — AB (ref 4.0–6.0)

## 2018-08-11 LAB — VITAMIN D 25 HYDROXY (VIT D DEFICIENCY, FRACTURES)

## 2018-08-15 LAB — BASIC METABOLIC PANEL
BUN: 24 — AB (ref 4–21)
CREATININE: 0.7 (ref 0.5–1.1)
Glucose: 104
Potassium: 4 (ref 3.4–5.3)
Sodium: 143 (ref 137–147)

## 2018-08-22 ENCOUNTER — Encounter: Payer: Self-pay | Admitting: Internal Medicine

## 2018-08-22 ENCOUNTER — Non-Acute Institutional Stay (SKILLED_NURSING_FACILITY): Payer: Medicare Other | Admitting: Internal Medicine

## 2018-08-22 DIAGNOSIS — J069 Acute upper respiratory infection, unspecified: Secondary | ICD-10-CM

## 2018-08-22 NOTE — Progress Notes (Signed)
:  Location:  [facility] Room Number: 317P Place of Service:  SNF (31)  Hyde Sires D. Lyn Hollingshead, MD  Patient Care Team: Manus Gunning, FNP as PCP - General (Nurse Practitioner) Herschel Senegal, MD as Referring Physician (Internal Medicine)  Extended Emergency Contact Information Primary Emergency Contact: Dyke Maes [place] Home Phone: [phone] Mobile Phone: [phone] Relation: Son Secondary Emergency Contact: Miller,Laurie Mobile Phone: [phone] Relation: Daughter     Allergies: Comtan [entacapone]; Mirapex [pramipexole dihydrochloride]; Sulfa antibiotics; and Ciprofloxacin  Chief Complaint  Patient presents with  . Acute Visit    sore throat    HPI: Patient is 84 y.o. female who is being seen today for complaint of sore throat, for several days and a cough is not productive.  Does patient does complain of postnasal drainage, and she does not feel like it is in her lungs she feels like it is in her head.  Per staff she seen only more confused today than usual although I do not find that to be the case.  She has had no fever no urinary symptoms.  Her bedside O2 saturation is 94 to 95% on room air lungs are clear.  Past Medical History:  Diagnosis Date  . Abnormality of gait 02/05/2015  . Anxiety   . Arthritis   . Asthma   . Cerebrovascular disease    CVA '03  . Depression   . Dyslipidemia   . Endometriosis   . GERD (gastroesophageal reflux disease)   . Glaucoma   . Headache(784.0)   . Hypertension   . Macular degeneration    bilateral  . Obesity   . Paralysis agitans (HCC) 08/31/2013  . Parkinson disease (HCC)   . TIA (transient ischemic attack)    Multiple    Past Surgical History:  Procedure Laterality Date  . ABDOMINAL HYSTERECTOMY    . CATARACT EXTRACTION, BILATERAL    . GALLBLADDER SURGERY    . LOOP RECORDER IMPLANT  05-28-14   MDT LINQ implanted by Dr Graciela Husbands for cryptogenic  stroke/palpitations  . LOOP RECORDER IMPLANT N/A 05/28/2014   Procedure: LOOP RECORDER IMPLANT;  Surgeon: Duke Salvia, MD;  Location: [facility] CATH LAB;  Service: Cardiovascular;  Laterality: N/A;  . PILONIDAL CYST RESECTION    . TILT TABLE STUDY  05-28-14   orthostatic hypertension  . TILT TABLE STUDY N/A 05/28/2014   Procedure: TILT TABLE STUDY;  Surgeon: Duke Salvia, MD;  Location: [facility] CATH LAB;  Service: Cardiovascular;  Laterality: N/A;  . TONSILLECTOMY      Allergies as of 08/22/2018      Reactions   Comtan [entacapone] Other (See Comments)   Reaction:  Unknown    Mirapex [pramipexole Dihydrochloride] Other (See Comments)   Reaction:  Unknown    Sulfa Antibiotics Nausea And Vomiting   Ciprofloxacin Other (See Comments)   Reaction:  Made pt feel crazy       Medication List        Accurate as of 08/22/18 11:59 PM. Always use your most recent med list.          Acetaminophen 500 MG coapsule Take 500 mg by mouth 3 (three) times daily.   ALPRAZolam 0.5 MG tablet Commonly known as:  XANAX Take 1 tablet (0.5 mg total) by mouth 3 (three) times daily.   atorvastatin 10 MG tablet Commonly known as:  LIPITOR Take 10 mg by mouth at bedtime.   BIOFREEZE 4 % Gel Generic drug:  Menthol (Topical  Analgesic) Apply 1 application topically 3 (three) times daily.   carbidopa-levodopa 25-100 MG tablet Commonly known as:  SINEMET IR Take 1 tablet by mouth every 8 (eight) hours.   clopidogrel 75 MG tablet Commonly known as:  PLAVIX Take 75 mg by mouth daily at 12 noon.   diltiazem 60 MG tablet Commonly known as:  CARDIZEM Take 1 tablet (60 mg total) by mouth every 6 (six) hours.   DULoxetine 30 MG capsule Commonly known as:  CYMBALTA Take 30 mg by mouth daily.   escitalopram 5 MG tablet Commonly known as:  LEXAPRO Take 5 mg by mouth daily.   glucose blood test strip 1 each by Other route as needed for other (ACCU-CHECKS MONDAY AND WEDNSDAY BEFORE BREAKFAST). Use as  instructed   losartan 25 MG tablet Commonly known as:  COZAAR Take 25 mg by mouth at bedtime.   Melatonin 3 MG Caps Take 3 mg by mouth at bedtime.   metFORMIN 500 MG tablet Commonly known as:  GLUCOPHAGE Take 500 mg by mouth 2 (two) times daily before a meal.   montelukast 10 MG tablet Commonly known as:  SINGULAIR Take 10 mg by mouth daily at 12 noon.   oxymetazoline 0.05 % nasal spray Commonly known as:  AFRIN Place 1 spray into both nostrils as needed (for nose bleeds).   pantoprazole sodium 40 mg/20 mL Pack Commonly known as:  PROTONIX Take 20 mLs (40 mg total) by mouth daily.   TAB-A-VITE Tabs Take 1 tablet by mouth daily at 12 noon.   Travoprost (BAK Free) 0.004 % Soln ophthalmic solution Commonly known as:  TRAVATAN Place 1 drop into both eyes at bedtime.   VENTOLIN HFA 108 (90 Base) MCG/ACT inhaler Generic drug:  albuterol Inhale 2 puffs into the lungs every 6 (six) hours as needed for wheezing or shortness of breath.       No orders of the defined types were placed in this encounter.    There is no immunization history on file for this patient.  Social History   Tobacco Use  . Smoking status: Never Smoker  . Smokeless tobacco: Never Used  Substance Use Topics  . Alcohol use: No    Family history is   Family History  Problem Relation Age of Onset  . Heart attack Mother   . Arthritis Brother   . Skin cancer Brother       Review of Systems  DATA OBTAINED: from patient, nurse-both as per history present illness GENERAL:  no fevers, fatigue, appetite changes SKIN: No itching, or rash EYES: No eye pain, redness, discharge EARS: No earache, tinnitus, change in hearing NOSE: No congestion, drainage or bleeding  MOUTH/THROAT: No mouth or tooth pain, + sore throat RESPIRATORY: + cough,no wheezing, no SOB CARDIAC: No chest pain, palpitations, lower extremity edema  GI: No abdominal pain, No N/V/D or constipation, No heartburn or reflux  GU: No  dysuria, frequency or urgency, or incontinence  MUSCULOSKELETAL: No unrelieved bone/joint pain NEUROLOGIC: No headache, dizziness or focal weakness PSYCHIATRIC: No c/o anxiety or sadness   Vitals:   08/22/18 1436  BP: 135/74  Pulse: 88  Resp: 18  Temp: (!) 96.8 F (36 C)  SpO2: 94%    SpO2 Readings from Last 1 Encounters:  08/25/18 94%   Body mass index is 35.27 kg/m.     Physical Exam  GENERAL APPEARANCE: Alert, conversant,  No acute distress.  SKIN: No diaphoresis rash HEAD: Normocephalic, atraumatic  EYES: Conjunctiva/lids clear. Pupils round,  reactive. EOMs intact.  EARS: External exam WNL, canals clear. Hearing grossly normal.  NOSE: No deformity or discharge.  MOUTH/THROAT: Lips w/o lesions  RESPIRATORY: Breathing is even, unlabored. Lung sounds are clear   CARDIOVASCULAR: Heart RRR 2/6  Murmur  no rubs or gallops. No peripheral edema.   GASTROINTESTINAL: Abdomen is soft, non-tender, not distended w/ normal bowel sounds. GENITOURINARY: Bladder non tender, not distended  MUSCULOSKELETAL: No abnormal joints or musculature NEUROLOGIC:  Cranial nerves 2-12 grossly intact. Moves all extremities  PSYCHIATRIC: Mood and affect appropriate to situation, no behavioral issues  Patient Active Problem List   Diagnosis Date Noted  . Viral pneumonia 07/03/2018  . Acute respiratory failure with hypoxia (HCC) 07/03/2018  . Hypokalemia 07/03/2018  . Diabetes mellitus type II, controlled (HCC) 07/03/2018  . Depression 07/03/2018  . Essential hypertension   . Sepsis (HCC)   . Altered mental status   . Metabolic encephalopathy 06/17/2018  . Aspiration pneumonitis (HCC) 06/17/2018  . Tachycardia 08/12/2015  . Abnormality of gait 02/05/2015  . Palpitations 05/28/2014  . TIA (transient ischemic attack) 05/25/2014  . Hyperlipidemia associated with type 2 diabetes mellitus (HCC) 05/25/2014  . Parkinson disease (HCC) 05/25/2014  . H/O: CVA (cerebrovascular accident) 05/25/2014    . Paralysis agitans (HCC) 08/31/2013  . Dizziness and giddiness 08/31/2013      Labs reviewed: Basic Metabolic Panel:    Component Value Date/Time   NA 143 08/15/2018   K 4.0 08/15/2018   CL 101 06/20/2018 0541   CO2 29 06/20/2018 0541   GLUCOSE 109 (H) 06/20/2018 0541   BUN 24 (A) 08/15/2018   CREATININE 0.7 08/15/2018   CREATININE 0.79 06/20/2018 0541   CALCIUM 10.2 06/20/2018 0541   PROT 7.4 06/17/2018 1554   ALBUMIN 3.9 06/17/2018 1554   AST 22 08/08/2018   ALT 26 08/08/2018   ALKPHOS 91 08/08/2018   BILITOT 0.8 06/17/2018 1554   GFRNONAA >60 06/20/2018 0541   GFRAA >60 06/20/2018 0541    Recent Labs    06/18/18 0528 06/19/18 0524 06/20/18 0541 08/08/18 08/15/18  NA 140 141 140 142 143  K 3.1* 3.6 3.7 4.0 4.0  CL 103 102 101  --   --   CO2 30 30 29   --   --   GLUCOSE 114* 123* 109*  --   --   BUN 21 20 19 20  24*  CREATININE 0.74 0.80 0.79 0.7 0.7  CALCIUM 9.9 10.0 10.2  --   --    Liver Function Tests: Recent Labs    06/17/18 1554 08/08/18  AST 34 22  ALT 26 26  ALKPHOS 71 91  BILITOT 0.8  --   PROT 7.4  --   ALBUMIN 3.9  --    No results for input(s): LIPASE, AMYLASE in the last 8760 hours. No results for input(s): AMMONIA in the last 8760 hours. CBC: Recent Labs    06/17/18 1554 06/18/18 0528 06/19/18 0524 06/20/18 0541 08/08/18  WBC 13.7* 10.0 7.3 7.3 8.5  8.5  NEUTROABS 10.7*  --  3.8 3.5  --   HGB 12.9 11.2* 11.3* 12.2 11.4*  11.4*  HCT 39.9 34.9* 35.3* 38.0 34*  34*  MCV 93.0 92.1 92.4 92.9  --   PLT 315 314 330 373 354  354   Lipid Recent Labs    08/08/18  CHOL 167  HDL 41  LDLCALC 71  TRIG 277*    Cardiac Enzymes: No results for input(s): CKTOTAL, CKMB, CKMBINDEX, TROPONINI in the last 8760  hours. BNP: No results for input(s): BNP in the last 8760 hours. No results found for: [facility] Lab Results  Component Value Date   HGBA1C 6.2 (A) 08/08/2018   Lab Results  Component Value Date   TSH 3.09 08/08/2018    Lab Results  Component Value Date   VITAMINB12 813 05/26/2014   No results found for: FOLATE No results found for: IRON, TIBC, FERRITIN  Imaging and Procedures obtained prior to SNF admission: Dg Chest 2 View  Result Date: 06/18/2018 CLINICAL DATA:  Dyspnea EXAM: CHEST - 2 VIEW COMPARISON:  06/17/2018 FINDINGS: Mild cardiac enlargement.  Cardiac loop recorder. Progression of bibasilar airspace disease which may be atelectasis or pneumonia. No effusion or edema IMPRESSION: Progression bibasilar airspace disease. Electronically Signed   By: Marlan Palau M.D.   On: 06/18/2018 09:32   Dg Chest 2 View  Result Date: 06/17/2018 CLINICAL DATA:  Patient BIB per EMS from [facility]. Pt has had altered mental status for x 2 days and a concussion 1 week ago. Pt has had SOB on and off with increased lethargy and tachypnea per EMS. Hx of UTI-being treated with antibiotics.*comment was truncated* EXAM: CHEST - 2 VIEW COMPARISON:  08/12/2015 FINDINGS: Normal cardiac silhouette. No effusion, infiltrate pneumothorax. Mild central venous congestion. Remote RIGHT humerus fracture. Degenerative osteophytosis of the spine. IMPRESSION: No acute cardiopulmonary process. Electronically Signed   By: Genevive Bi M.D.   On: 06/17/2018 16:27   Dg Swallowing Func-speech Pathology  Result Date: 06/18/2018 Objective Swallowing Evaluation: Type of Study: MBS-Modified Barium Swallow Study  Patient Details Name: [name] MRN: [id number] Date of Birth: 23-May-1930 Today's Date: 06/18/2018 Time: SLP Start Time (ACUTE ONLY): 1115 -SLP Stop Time (ACUTE ONLY): 1145 SLP Time Calculation (min) (ACUTE ONLY): 30 min Past Medical History: Past Medical History: Diagnosis Date . Abnormality of gait 02/05/2015 . Anxiety  . Arthritis  . Asthma  . Cerebrovascular disease   CVA '03 . Depression  . Dyslipidemia  . Endometriosis  . GERD (gastroesophageal reflux disease)  . Glaucoma  . Headache(784.0)  . Hypertension  . Macular  degeneration   bilateral . Obesity  . Paralysis agitans (HCC) 08/31/2013 . Parkinson disease (HCC)  . TIA (transient ischemic attack)   Multiple Past Surgical History: Past Surgical History: Procedure Laterality Date . ABDOMINAL HYSTERECTOMY   . CATARACT EXTRACTION, BILATERAL   . GALLBLADDER SURGERY   . LOOP RECORDER IMPLANT  05-28-14  MDT LINQ implanted by Dr Graciela Husbands for cryptogenic stroke/palpitations . LOOP RECORDER IMPLANT N/A 05/28/2014  Procedure: LOOP RECORDER IMPLANT;  Surgeon: Duke Salvia, MD;  Location: [facility] CATH LAB;  Service: Cardiovascular;  Laterality: N/A; . PILONIDAL CYST RESECTION   . TILT TABLE STUDY  05-28-14  orthostatic hypertension . TILT TABLE STUDY N/A 05/28/2014  Procedure: TILT TABLE STUDY;  Surgeon: Duke Salvia, MD;  Location: [facility] CATH LAB;  Service: Cardiovascular;  Laterality: N/A; . TONSILLECTOMY   HPI: Jasmine Pyles Robertsonis a 84 y.o.femalewith medical history significant ofParkinson's disease and ambulatory dysfunction, wheelchair-bound. She was noted to have generalized weakness, malaise and poor appetite over the last 5 days. She was diagnosed with urine tract infection 24 hours after symptoms started and received ciprofloxacin 48 hours after her symptoms started.Despite antibiotic therapy she continued to deteriorate andtoday she was found febrile, dyspneic and ill looking appearing. She was transferred to the hospital for furtherevaluation. On direct questioning patient mentions experiencing worsening dysphagia for the last 5 days, persistent, associated with severe coughing and choking mainly postprandial,no  improving or worsening factors. Denies dyspnea but positive wheezing. She has been in a wheelchair for last few years due to her Parkinson's disease. In the outpatient clinic itwas suggested that she hasswallow dysfunction and a swallow evaluation was planned.  Most recent CXR is showing progression of bibasilar airspace disease.   Subjective: The patient was  seen in radiology for MBS. Assessment / Plan / Recommendation CHL IP CLINICAL IMPRESSIONS 06/18/2018 Clinical Impression MBS was completed using thin liquids, nectar thick liquids, pureed material and dry solids.  The patient presented with a mild  oropharyngeal dysphagia.  The oral phase was marked by decreased lingual motion that led to delayed oral transit and oral residue.  She generally displayed good oral control of the bolus but intermittently had some issues with material falling into the buccal area.  The pharyngeal phase was marked by decreased hyo-laryngeal elevation and anterior movement, decreased laryngeal vestibule closure, mildly reduced base of tongue retraction and prominence of the cricopharyngeus.  She had intermittent mild residue at the vallecula.  Penetration into the laryngeal vestibule was seen prior to the swallow given self fed cup sips of thin liquids.   Cued cough and reswallow were successful to clear material.  Chin tuck was also successful to prevent.  Esophageal sweep did not reveal overt issues.  Recommend a regular diet with thin liquids.  Patient should tuck her chin with liquids.  Use of a straw facilitated use of chin tuck.  If patient is unable to use a chin tuck recommend using nectar thick liquids.  ST will follow for therapeutic diet tolerance and to initiate swallowing therapy.  She would benefit from ST follow up at next level of care.   SLP Visit Diagnosis Dysphagia, oropharyngeal phase (R13.12) Attention and concentration deficit following -- Frontal lobe and executive function deficit following -- Impact on safety and function Mild aspiration risk   CHL IP TREATMENT RECOMMENDATION 06/18/2018 Treatment Recommendations Therapy as outlined in treatment plan below   Prognosis 06/18/2018 Prognosis for Safe Diet Advancement Fair Barriers to Reach Goals -- Barriers/Prognosis Comment -- CHL IP DIET RECOMMENDATION 06/18/2018 SLP Diet Recommendations Regular solids;Thin liquid Liquid  Administration via Straw Medication Administration Crushed with puree Compensations Slow rate;Small sips/bites;Chin tuck Postural Changes Remain semi-upright after after feeds/meals (Comment)   CHL IP OTHER RECOMMENDATIONS 06/18/2018 Recommended Consults -- Oral Care Recommendations Oral care BID Other Recommendations --   CHL IP FOLLOW UP RECOMMENDATIONS 06/18/2018 Follow up Recommendations (No Data)   CHL IP FREQUENCY AND DURATION 06/18/2018 Speech Therapy Frequency (ACUTE ONLY) min 2x/week Treatment Duration 2 weeks      CHL IP ORAL PHASE 06/18/2018 Oral Phase Impaired Oral - Pudding Teaspoon -- Oral - Pudding Cup -- Oral - Honey Teaspoon -- Oral - Honey Cup -- Oral - Nectar Teaspoon -- Oral - Nectar Cup Delayed oral transit;Lingual/palatal residue Oral - Nectar Straw -- Oral - Thin Teaspoon Delayed oral transit;Lingual/palatal residue Oral - Thin Cup Lingual/palatal residue;Delayed oral transit Oral - Thin Straw -- Oral - Puree Lingual/palatal residue;Delayed oral transit Oral - Mech Soft -- Oral - Regular Delayed oral transit;Lingual/palatal residue Oral - Multi-Consistency -- Oral - Pill -- Oral Phase - Comment --  CHL IP PHARYNGEAL PHASE 06/18/2018 Pharyngeal Phase Impaired Pharyngeal- Pudding Teaspoon -- Pharyngeal -- Pharyngeal- Pudding Cup -- Pharyngeal -- Pharyngeal- Honey Teaspoon -- Pharyngeal -- Pharyngeal- Honey Cup -- Pharyngeal -- Pharyngeal- Nectar Teaspoon -- Pharyngeal -- Pharyngeal- Nectar Cup Delayed swallow initiation-vallecula Pharyngeal -- Pharyngeal- Nectar Straw -- Pharyngeal -- Pharyngeal- Thin  Teaspoon Delayed swallow initiation-vallecula;Reduced laryngeal elevation;Reduced anterior laryngeal mobility Pharyngeal -- Pharyngeal- Thin Cup Delayed swallow initiation-pyriform sinuses;Reduced laryngeal elevation;Reduced anterior laryngeal mobility;Penetration/Aspiration before swallow;Reduced airway/laryngeal closure;Pharyngeal residue - valleculae Pharyngeal Material enters airway, remains ABOVE  vocal cords and not ejected out Pharyngeal- Thin Straw -- Pharyngeal -- Pharyngeal- Puree Delayed swallow initiation-vallecula Pharyngeal -- Pharyngeal- Mechanical Soft -- Pharyngeal -- Pharyngeal- Regular Delayed swallow initiation-vallecula Pharyngeal -- Pharyngeal- Multi-consistency -- Pharyngeal -- Pharyngeal- Pill -- Pharyngeal -- Pharyngeal Comment --  No flowsheet data found. Dimas Aguas, MA, CCC-SLP Acute Rehab SLP [phone] Fleet Contras 06/18/2018, 12:35 PM                Not all labs, radiology exams or other studies done during hospitalization come through on my EPIC note; however they are reviewed by me.    Assessment and Plan  URI- at this point patient appears to have a URI that is been going around the nursing home; will treat symptoms and monitor    Thurston Hole D. Lyn Hollingshead, MD

## 2018-08-25 ENCOUNTER — Encounter: Payer: Self-pay | Admitting: Internal Medicine

## 2018-08-25 ENCOUNTER — Non-Acute Institutional Stay (SKILLED_NURSING_FACILITY): Payer: Medicare Other | Admitting: Internal Medicine

## 2018-08-25 DIAGNOSIS — K219 Gastro-esophageal reflux disease without esophagitis: Secondary | ICD-10-CM | POA: Diagnosis not present

## 2018-08-25 DIAGNOSIS — Z8673 Personal history of transient ischemic attack (TIA), and cerebral infarction without residual deficits: Secondary | ICD-10-CM

## 2018-08-25 DIAGNOSIS — G20 Parkinson's disease: Secondary | ICD-10-CM

## 2018-08-25 NOTE — Progress Notes (Signed)
Location:  [facility] Room Number: 606T Place of Service:  SNF (31)  [name]. Jasmine Coil, MD  Patient Care Team: Jacklyn Shell, FNP as PCP - General (Nurse Practitioner) Marylynn Pearson, MD as Referring Physician (Internal Medicine)  Extended Emergency Contact Information Primary Emergency Contact: Macky [place] Phone: [phone] Mobile Phone: [phone] Relation: Son Secondary Emergency Contact: [place] Mobile Phone: [phone] Relation: Daughter    Allergies: Comtan [entacapone]; Mirapex [pramipexole dihydrochloride]; Sulfa antibiotics; and Ciprofloxacin  Chief Complaint  Patient presents with  . Medical Management of Chronic Issues    Routine Visit  . Health Maintenance    Influenza vacc.    HPI: Patient is 84 y.o. female who is being seen for routine issues of Parkinson's disease, history of CVA, and GERD.  Past Medical History:  Diagnosis Date  . Abnormality of gait 02/05/2015  . Anxiety   . Arthritis   . Asthma   . Cerebrovascular disease    CVA '03  . Depression   . Dyslipidemia   . Endometriosis   . GERD (gastroesophageal reflux disease)   . Glaucoma   . Headache(784.0)   . Hypertension   . Macular degeneration    bilateral  . Obesity   . Paralysis agitans ([place]) 08/31/2013  . Parkinson disease ([place])   . TIA (transient ischemic attack)    Multiple    Past Surgical History:  Procedure Laterality Date  . ABDOMINAL HYSTERECTOMY    . CATARACT EXTRACTION, BILATERAL    . GALLBLADDER SURGERY    . LOOP RECORDER IMPLANT  05-28-14   MDT LINQ implanted by Dr Caryl Comes for cryptogenic stroke/palpitations  . LOOP RECORDER IMPLANT N/A 05/28/2014   Procedure: LOOP RECORDER IMPLANT;  Surgeon: Deboraha Sprang, MD;  Location: [facility] CATH LAB;  Service: Cardiovascular;  Laterality: N/A;  . PILONIDAL CYST RESECTION    . TILT TABLE STUDY  05-28-14   orthostatic hypertension  . TILT TABLE STUDY  N/A 05/28/2014   Procedure: TILT TABLE STUDY;  Surgeon: Deboraha Sprang, MD;  Location: [facility] CATH LAB;  Service: Cardiovascular;  Laterality: N/A;  . TONSILLECTOMY      Allergies as of 08/25/2018      Reactions   Comtan [entacapone] Other (See Comments)   Reaction:  Unknown    Mirapex [pramipexole Dihydrochloride] Other (See Comments)   Reaction:  Unknown    Sulfa Antibiotics Nausea And Vomiting   Ciprofloxacin Other (See Comments)   Reaction:  Made pt feel crazy       Medication List        Accurate as of 08/25/18 11:59 PM. Always use your most recent med list.          Acetaminophen 500 MG coapsule Take 500 mg by mouth 3 (three) times daily.   ALPRAZolam 0.5 MG tablet Commonly known as:  XANAX Take 1 tablet (0.5 mg total) by mouth 3 (three) times daily.   atorvastatin 10 MG tablet Commonly known as:  LIPITOR Take 10 mg by mouth at bedtime.   BIOFREEZE 4 % Gel Generic drug:  Menthol (Topical Analgesic) Apply 1 application topically 3 (three) times daily.   carbidopa-levodopa 25-100 MG tablet Commonly known as:  SINEMET IR Take 1 tablet by mouth every 8 (eight) hours.   clopidogrel 75 MG tablet Commonly known as:  PLAVIX Take 75 mg by mouth daily at 12 noon.   diltiazem 60 MG tablet Commonly known as:  CARDIZEM Take 1 tablet (60 mg  total) by mouth every 6 (six) hours.   DULoxetine 30 MG capsule Commonly known as:  CYMBALTA Take 30 mg by mouth daily.   escitalopram 5 MG tablet Commonly known as:  LEXAPRO Take 5 mg by mouth daily.   glucose blood test strip 1 each by Other route as needed for other ([place]). Use as instructed   losartan 25 MG tablet Commonly known as:  COZAAR Take 25 mg by mouth at bedtime.   Melatonin 3 MG Caps Take 3 mg by mouth at bedtime.   metFORMIN 500 MG tablet Commonly known as:  GLUCOPHAGE Take 500 mg by mouth 2 (two) times daily before a meal.   montelukast 10 MG tablet Commonly  known as:  SINGULAIR Take 10 mg by mouth daily at 12 noon.   oxymetazoline 0.05 % nasal spray Commonly known as:  AFRIN Place 1 spray into both nostrils as needed (for nose bleeds).   pantoprazole sodium 40 mg/20 mL Pack Commonly known as:  PROTONIX Take 20 mLs (40 mg total) by mouth daily.   TAB-A-VITE Tabs Take 1 tablet by mouth daily at 12 noon.   Travoprost (BAK Free) 0.004 % Soln ophthalmic solution Commonly known as:  TRAVATAN Place 1 drop into both eyes at bedtime.   VENTOLIN HFA 108 (90 Base) MCG/ACT inhaler Generic drug:  albuterol Inhale 2 puffs into the lungs every 6 (six) hours as needed for wheezing or shortness of breath.       No orders of the defined types were placed in this encounter.    There is no immunization history on file for this patient.  Social History   Tobacco Use  . Smoking status: Never Smoker  . Smokeless tobacco: Never Used  Substance Use Topics  . Alcohol use: No    Review of Systems  DATA OBTAINED: from patient, nurse GENERAL:  no fevers, fatigue, appetite changes; URI symptoms SKIN: No itching, rash HEENT: Sore throat RESPIRATORY: + cough, wheezing, SOB CARDIAC: No chest pain, palpitations, lower extremity edema  GI: No abdominal pain, No N/V/D or constipation, No heartburn or reflux  GU: No dysuria, frequency or urgency, or incontinence  MUSCULOSKELETAL: No unrelieved bone/joint pain NEUROLOGIC: No headache, dizziness  PSYCHIATRIC: No overt anxiety or sadness  Vitals:   08/25/18 1108  BP: 135/74  Pulse: (!) 8  Resp: 18  Temp: (!) 96.8 F (36 C)  SpO2: 94%   Body mass index is 36.95 kg/m. Physical Exam  GENERAL APPEARANCE: Alert, conversant, No acute distress  SKIN: No diaphoresis rash HEENT: Unremarkable RESPIRATORY: Breathing is even, unlabored. Lung sounds are clear   CARDIOVASCULAR: Heart RRR 2/6 systolic  Murmur, no rubs or gallops. No peripheral edema  GASTROINTESTINAL: Abdomen is soft, non-tender, not  distended w/ normal bowel sounds.  GENITOURINARY: Bladder non tender, not distended  MUSCULOSKELETAL: No abnormal joints or musculature NEUROLOGIC: Cranial nerves 2-12 grossly intact. Moves all extremities PSYCHIATRIC: Mood and affect appropriate to situation, no behavioral issues  Patient Active Problem List   Diagnosis Date Noted  . GERD (gastroesophageal reflux disease) 09/05/2018  . Viral pneumonia 07/03/2018  . Acute respiratory failure with hypoxia ([place]) 07/03/2018  . Hypokalemia 07/03/2018  . Diabetes mellitus type II, controlled ([place]) 07/03/2018  . Depression 07/03/2018  . Essential hypertension   . Sepsis ([place])   . Altered mental status   . Metabolic encephalopathy 17/51/0258  . Aspiration pneumonitis ([place]) 06/17/2018  . Tachycardia 08/12/2015  . Abnormality of gait 02/05/2015  . Palpitations  05/28/2014  . TIA (transient ischemic attack) 05/25/2014  . Hyperlipidemia associated with type 2 diabetes mellitus ([place]) 05/25/2014  . Parkinson disease ([place]) 05/25/2014  . H/O: CVA (cerebrovascular accident) 05/25/2014  . Paralysis agitans ([place]) 08/31/2013  . Dizziness and giddiness 08/31/2013    CMP     Component Value Date/Time   NA 143 08/15/2018   K 4.0 08/15/2018   CL 101 06/20/2018 0541   CO2 29 06/20/2018 0541   GLUCOSE 109 (H) 06/20/2018 0541   BUN 24 (A) 08/15/2018   CREATININE 0.7 08/15/2018   CREATININE 0.79 06/20/2018 0541   CALCIUM 10.2 06/20/2018 0541   PROT 7.4 06/17/2018 1554   ALBUMIN 3.9 06/17/2018 1554   AST 22 08/08/2018   ALT 26 08/08/2018   ALKPHOS 91 08/08/2018   BILITOT 0.8 06/17/2018 1554   GFRNONAA >60 06/20/2018 0541   GFRAA >60 06/20/2018 0541   Recent Labs    06/18/18 0528 06/19/18 0524 06/20/18 0541 08/08/18 08/15/18  NA 140 141 140 142 143  K 3.1* 3.6 3.7 4.0 4.0  CL 103 102 101  --   --   CO2 30 30 29   --   --   GLUCOSE 114* 123* 109*  --   --   BUN 21 20 19 20  24*  CREATININE 0.74 0.80 0.79 0.7 0.7  CALCIUM 9.9 10.0 10.2   --   --    Recent Labs    06/17/18 1554 08/08/18  AST 34 22  ALT 26 26  ALKPHOS 71 91  BILITOT 0.8  --   PROT 7.4  --   ALBUMIN 3.9  --    Recent Labs    06/17/18 1554 06/18/18 0528 06/19/18 0524 06/20/18 0541 08/08/18  WBC 13.7* 10.0 7.3 7.3 8.5  8.5  NEUTROABS 10.7*  --  3.8 3.5  --   HGB 12.9 11.2* 11.3* 12.2 11.4*  11.4*  HCT 39.9 34.9* 35.3* 38.0 34*  34*  MCV 93.0 92.1 92.4 92.9  --   PLT 315 314 330 373 354  354   Recent Labs    08/08/18  CHOL 167  LDLCALC 71  TRIG 277*   No results found for: [facility] Lab Results  Component Value Date   TSH 3.09 08/08/2018   Lab Results  Component Value Date   HGBA1C 6.2 (A) 08/08/2018   Lab Results  Component Value Date   CHOL 167 08/08/2018   HDL 41 08/08/2018   LDLCALC 71 08/08/2018   TRIG 277 (A) 08/08/2018   CHOLHDL 5.3 05/26/2014    Significant Diagnostic Results in last 30 days:  No results found.  Assessment and Plan  Parkinson disease Patient has very few symptoms, moves well; continue Sinemet 25-100 every 8 hours  H/O: CVA (cerebrovascular accident) Patient appears to have very little residual; continue Plavix 75 mg daily  GERD (gastroesophageal reflux disease) No complaints of reflux; continue Protonix 40 mg daily    Stephen Baruch D. Jasmine Coil, MD

## 2018-08-30 ENCOUNTER — Non-Acute Institutional Stay (SKILLED_NURSING_FACILITY): Payer: Medicare Other | Admitting: Internal Medicine

## 2018-08-30 DIAGNOSIS — J40 Bronchitis, not specified as acute or chronic: Secondary | ICD-10-CM | POA: Diagnosis not present

## 2018-09-05 ENCOUNTER — Encounter: Payer: Self-pay | Admitting: Internal Medicine

## 2018-09-05 DIAGNOSIS — K219 Gastro-esophageal reflux disease without esophagitis: Secondary | ICD-10-CM | POA: Insufficient documentation

## 2018-09-05 NOTE — Assessment & Plan Note (Signed)
No complaints of reflux; continue Protonix 40 mg daily 

## 2018-09-05 NOTE — Progress Notes (Signed)
Location:  [facility] of Service:  SNF [phone]) Provider: Hennie Duos MD   Jacklyn Shell, FNP  Patient Care Team: Jacklyn Shell, FNP as PCP - General (Nurse Practitioner) Marylynn Pearson, MD as Referring Physician (Internal Medicine)  Extended Emergency Contact Information Primary Emergency Contact: Macky [place] Phone: [phone] Mobile Phone: [phone] Relation: Son Secondary Emergency Contact: [place] Mobile Phone: [phone] Relation: Daughter    Allergies: Comtan [entacapone]; Mirapex [pramipexole dihydrochloride]; Sulfa antibiotics; and Ciprofloxacin  Chief Complaint  Patient presents with  . Acute Visit    HPI: Patient is 84 y.o. female who nursing asked me to see.  Patient is still complaining of cough and today she complaining that she has central chest pain with her cough.  Cough is nonproductive.  Patient has no shortness of breath or wheezing.  Today patient says she feels bad.  She is still eating and drinking  Past Medical History:  Diagnosis Date  . Abnormality of gait 02/05/2015  . Anxiety   . Arthritis   . Asthma   . Cerebrovascular disease    CVA '03  . Depression   . Dyslipidemia   . Endometriosis   . GERD (gastroesophageal reflux disease)   . Glaucoma   . Headache(784.0)   . Hypertension   . Macular degeneration    bilateral  . Obesity   . Paralysis agitans ([place]) 08/31/2013  . Parkinson disease ([place])   . TIA (transient ischemic attack)    Multiple    Past Surgical History:  Procedure Laterality Date  . ABDOMINAL HYSTERECTOMY    . CATARACT EXTRACTION, BILATERAL    . GALLBLADDER SURGERY    . LOOP RECORDER IMPLANT  05-28-14   MDT LINQ implanted by Dr Caryl Comes for cryptogenic stroke/palpitations  . LOOP RECORDER IMPLANT N/A 05/28/2014   Procedure: LOOP RECORDER IMPLANT;  Surgeon: Deboraha Sprang, MD;  Location: [facility] CATH LAB;  Service: Cardiovascular;  Laterality:  N/A;  . PILONIDAL CYST RESECTION    . TILT TABLE STUDY  05-28-14   orthostatic hypertension  . TILT TABLE STUDY N/A 05/28/2014   Procedure: TILT TABLE STUDY;  Surgeon: Deboraha Sprang, MD;  Location: [facility] CATH LAB;  Service: Cardiovascular;  Laterality: N/A;  . TONSILLECTOMY      Allergies as of 08/30/2018      Reactions   Comtan [entacapone] Other (See Comments)   Reaction:  Unknown    Mirapex [pramipexole Dihydrochloride] Other (See Comments)   Reaction:  Unknown    Sulfa Antibiotics Nausea And Vomiting   Ciprofloxacin Other (See Comments)   Reaction:  Made pt feel crazy       Medication List        Accurate as of 08/30/18 11:59 PM. Always use your most recent med list.          Acetaminophen 500 MG coapsule Take 500 mg by mouth 3 (three) times daily.   ALPRAZolam 0.5 MG tablet Commonly known as:  XANAX Take 1 tablet (0.5 mg total) by mouth 3 (three) times daily.   atorvastatin 10 MG tablet Commonly known as:  LIPITOR Take 10 mg by mouth at bedtime.   BIOFREEZE 4 % Gel Generic drug:  Menthol (Topical Analgesic) Apply 1 application topically 3 (three) times daily.   carbidopa-levodopa 25-100 MG tablet Commonly known as:  SINEMET IR Take 1 tablet by mouth every 8 (eight) hours.   clopidogrel 75 MG tablet Commonly known as:  PLAVIX Take 75 mg  by mouth daily at 12 noon.   diltiazem 60 MG tablet Commonly known as:  CARDIZEM Take 1 tablet (60 mg total) by mouth every 6 (six) hours.   DULoxetine 30 MG capsule Commonly known as:  CYMBALTA Take 30 mg by mouth daily.   escitalopram 5 MG tablet Commonly known as:  LEXAPRO Take 5 mg by mouth daily.   glucose blood test strip 1 each by Other route as needed for other ([place]). Use as instructed   losartan 25 MG tablet Commonly known as:  COZAAR Take 25 mg by mouth at bedtime.   Melatonin 3 MG Caps Take 3 mg by mouth at bedtime.   metFORMIN 500 MG tablet Commonly known  as:  GLUCOPHAGE Take 500 mg by mouth 2 (two) times daily before a meal.   montelukast 10 MG tablet Commonly known as:  SINGULAIR Take 10 mg by mouth daily at 12 noon.   oxymetazoline 0.05 % nasal spray Commonly known as:  AFRIN Place 1 spray into both nostrils as needed (for nose bleeds).   pantoprazole sodium 40 mg/20 mL Pack Commonly known as:  PROTONIX Take 20 mLs (40 mg total) by mouth daily.   TAB-A-VITE Tabs Take 1 tablet by mouth daily at 12 noon.   Travoprost (BAK Free) 0.004 % Soln ophthalmic solution Commonly known as:  TRAVATAN Place 1 drop into both eyes at bedtime.   VENTOLIN HFA 108 (90 Base) MCG/ACT inhaler Generic drug:  albuterol Inhale 2 puffs into the lungs every 6 (six) hours as needed for wheezing or shortness of breath.       No orders of the defined types were placed in this encounter.    There is no immunization history on file for this patient.  Social History   Tobacco Use  . Smoking status: Never Smoker  . Smokeless tobacco: Never Used  Substance Use Topics  . Alcohol use: No    Review of Systems  DATA OBTAINED: from patient, nurse, GENERAL:  no fevers, fatigue, appetite changes SKIN: No itching, rash HEENT: Sore throat RESPIRATORY: + cough, nowheezing, no SOB CARDIAC: Central chest pain chest pain with cough, palpitations, lower extremity edema  GI: No abdominal pain, No N/V/D or constipation, No heartburn or reflux  GU: No dysuria, frequency or urgency, or incontinence  MUSCULOSKELETAL: No unrelieved bone/joint pain NEUROLOGIC: No headache, dizziness  PSYCHIATRIC: No overt anxiety or sadness  Vitals:   09/05/18 0959  BP: 134/74  Pulse: 88  Resp: 18  Temp: (!) 96.8 F (36 C)  SpO2: 94%   There is no height or weight on file to calculate BMI. Physical Exam  GENERAL APPEARANCE: Alert, conversant, No acute distress: Patient does not cough during interview SKIN: No diaphoresis rash HEENT: Unremarkable RESPIRATORY:  Breathing is even, unlabored. Lung sounds are clear, no wheezing, no rails CARDIOVASCULAR: Heart RRR 2/6 systolic murmur, no rubs or gallops. No peripheral edema  GASTROINTESTINAL: Abdomen is soft, non-tender, not distended w/ normal bowel sounds.  GENITOURINARY: Bladder non tender, not distended  MUSCULOSKELETAL: No abnormal joints or musculature NEUROLOGIC: Cranial nerves 2-12 grossly intact. Moves all extremities PSYCHIATRIC: Mood and affect appropriate to situation, no behavioral issues  Patient Active Problem List   Diagnosis Date Noted  . GERD (gastroesophageal reflux disease) 09/05/2018  . Viral pneumonia 07/03/2018  . Acute respiratory failure with hypoxia ([place]) 07/03/2018  . Hypokalemia 07/03/2018  . Diabetes mellitus type II, controlled ([place]) 07/03/2018  . Depression 07/03/2018  . Essential hypertension   .  Sepsis ([place])   . Altered mental status   . Metabolic encephalopathy 62/94/7654  . Aspiration pneumonitis ([place]) 06/17/2018  . Tachycardia 08/12/2015  . Abnormality of gait 02/05/2015  . Palpitations 05/28/2014  . TIA (transient ischemic attack) 05/25/2014  . Hyperlipidemia associated with type 2 diabetes mellitus ([place]) 05/25/2014  . Parkinson disease ([place]) 05/25/2014  . H/O: CVA (cerebrovascular accident) 05/25/2014  . Paralysis agitans ([place]) 08/31/2013  . Dizziness and giddiness 08/31/2013    CMP     Component Value Date/Time   NA 143 08/15/2018   K 4.0 08/15/2018   CL 101 06/20/2018 0541   CO2 29 06/20/2018 0541   GLUCOSE 109 (H) 06/20/2018 0541   BUN 24 (A) 08/15/2018   CREATININE 0.7 08/15/2018   CREATININE 0.79 06/20/2018 0541   CALCIUM 10.2 06/20/2018 0541   PROT 7.4 06/17/2018 1554   ALBUMIN 3.9 06/17/2018 1554   AST 22 08/08/2018   ALT 26 08/08/2018   ALKPHOS 91 08/08/2018   BILITOT 0.8 06/17/2018 1554   GFRNONAA >60 06/20/2018 0541   GFRAA >60 06/20/2018 0541   Recent Labs    06/18/18 0528 06/19/18 0524 06/20/18 0541 08/08/18 08/15/18  NA  140 141 140 142 143  K 3.1* 3.6 3.7 4.0 4.0  CL 103 102 101  --   --   CO2 30 30 29   --   --   GLUCOSE 114* 123* 109*  --   --   BUN 21 20 19 20  24*  CREATININE 0.74 0.80 0.79 0.7 0.7  CALCIUM 9.9 10.0 10.2  --   --    Recent Labs    06/17/18 1554 08/08/18  AST 34 22  ALT 26 26  ALKPHOS 71 91  BILITOT 0.8  --   PROT 7.4  --   ALBUMIN 3.9  --    Recent Labs    06/17/18 1554 06/18/18 0528 06/19/18 0524 06/20/18 0541 08/08/18  WBC 13.7* 10.0 7.3 7.3 8.5  8.5  NEUTROABS 10.7*  --  3.8 3.5  --   HGB 12.9 11.2* 11.3* 12.2 11.4*  11.4*  HCT 39.9 34.9* 35.3* 38.0 34*  34*  MCV 93.0 92.1 92.4 92.9  --   PLT 315 314 330 373 354  354   Recent Labs    08/08/18  CHOL 167  LDLCALC 71  TRIG 277*   No results found for: [facility] Lab Results  Component Value Date   TSH 3.09 08/08/2018   Lab Results  Component Value Date   HGBA1C 6.2 (A) 08/08/2018   Lab Results  Component Value Date   CHOL 167 08/08/2018   HDL 41 08/08/2018   LDLCALC 71 08/08/2018   TRIG 277 (A) 08/08/2018   CHOLHDL 5.3 05/26/2014    Significant Diagnostic Results in last 30 days:  No results found.  Assessment and Plan  Bronchitis- patient is allergic to Cipro and sulfa; I have ordered a chest x-ray which did not return with no evidence for acute pulmonary process; will treat patient with a Z-Pak and continue with Robitussin-DM for cough     Time spent greater than 35 minutes Inocencio Homes, MD

## 2018-09-05 NOTE — Assessment & Plan Note (Signed)
Patient appears to have very little residual; continue Plavix 75 mg daily

## 2018-09-05 NOTE — Assessment & Plan Note (Signed)
Patient has very few symptoms, moves well; continue Sinemet 25-100 every 8 hours

## 2018-09-13 ENCOUNTER — Encounter: Payer: Self-pay | Admitting: Internal Medicine

## 2018-09-13 ENCOUNTER — Non-Acute Institutional Stay (SKILLED_NURSING_FACILITY): Payer: Medicare Other | Admitting: Internal Medicine

## 2018-09-13 DIAGNOSIS — T887XXA Unspecified adverse effect of drug or medicament, initial encounter: Secondary | ICD-10-CM

## 2018-09-13 DIAGNOSIS — F4321 Adjustment disorder with depressed mood: Secondary | ICD-10-CM

## 2018-09-13 NOTE — Progress Notes (Signed)
:   Location:  [facility] Room Number: 317P Place of Service:  SNF (31)  Jasmine Nickey D. Lyn Hollingshead, MD  Patient Care Team: Jasmine Gunning, FNP as PCP - General (Nurse Practitioner) Jasmine Senegal, MD as Referring Physician (Internal Medicine)  Extended Emergency Contact Information Primary Emergency Contact: [name] [place] of [place] Home Phone: [phone] Mobile Phone: [phone] Relation: Son Secondary Emergency Contact: [name] Mobile Phone: [phone] Relation: Daughter     Allergies: Comtan [entacapone]; Mirapex [pramipexole dihydrochloride]; Sulfa antibiotics; and Ciprofloxacin  Chief Complaint  Patient presents with  . Acute Visit    sick    HPI: Patient is 84 y.o. female who nursing asked me to see for complaints that she is sick.  They do not report any fever, chills, shortness of breath chest pain nausea vomiting diarrhea or other symptom.  Patient supports this, and says that she was sick after she took her flu shot but that she is now starting to feel better.  Past Medical History:  Diagnosis Date  . Abnormality of gait 02/05/2015  . Anxiety   . Arthritis   . Asthma   . Cerebrovascular disease    CVA '03  . Depression   . Dyslipidemia   . Endometriosis   . GERD (gastroesophageal reflux disease)   . Glaucoma   . Headache(784.0)   . Hypertension   . Macular degeneration    bilateral  . Obesity   . Paralysis agitans (HCC) 08/31/2013  . Parkinson disease (HCC)   . TIA (transient ischemic attack)    Multiple    Past Surgical History:  Procedure Laterality Date  . ABDOMINAL HYSTERECTOMY    . CATARACT EXTRACTION, BILATERAL    . GALLBLADDER SURGERY    . LOOP RECORDER IMPLANT  05-28-14   MDT LINQ implanted by Dr Graciela Husbands for cryptogenic stroke/palpitations  . LOOP RECORDER IMPLANT N/A 05/28/2014   Procedure: LOOP RECORDER IMPLANT;  Surgeon: Duke Salvia, MD;  Location: [facility] C[facility] LAB;  Service:  Cardiovascular;  Laterality: N/A;  . PILONIDAL CYST RESECTION    . TILT TABLE STUDY  05-28-14   orthostatic hypertension  . TILT TABLE STUDY N/A 05/28/2014   Procedure: TILT TABLE STUDY;  Surgeon: Duke Salvia, MD;  Location: [facility] CATH LAB;  Service: Cardiovascular;  Laterality: N/A;  . TONSILLECTOMY      Allergies as of 09/13/2018      Reactions   Comtan [entacapone] Other (See Comments)   Reaction:  Unknown    Mirapex [pramipexole Dihydrochloride] Other (See Comments)   Reaction:  Unknown    Sulfa Antibiotics Nausea And Vomiting   Ciprofloxacin Other (See Comments)   Reaction:  Made pt feel crazy       Medication List        Accurate as of 09/13/18 11:59 PM. Always use your most recent med list.          Acetaminophen 500 MG coapsule Take 500 mg by mouth 3 (three) times daily.   ALPRAZolam 0.5 MG tablet Commonly known as:  XANAX Take 1 tablet (0.5 mg total) by mouth 3 (three) times daily.   atorvastatin 10 MG tablet Commonly known as:  LIPITOR Take 10 mg by mouth at bedtime.   BIOFREEZE 4 % Gel Generic drug:  Menthol (Topical Analgesic) Apply 1 application topically 3 (three) times daily.   carbidopa-levodopa 25-100 MG tablet Commonly known as:  SINEMET IR Take 1 tablet by mouth every 8 (eight) hours.   clopidogrel 75 MG  tablet Commonly known as:  PLAVIX Take 75 mg by mouth daily at 12 noon.   diltiazem 60 MG tablet Commonly known as:  CARDIZEM Take 1 tablet (60 mg total) by mouth every 6 (six) hours.   DULoxetine 30 MG capsule Commonly known as:  CYMBALTA Take 30 mg by mouth daily.   escitalopram 5 MG tablet Commonly known as:  LEXAPRO Take 5 mg by mouth daily.   glucose blood test strip 1 each by Other route as needed for other (ACCU-CHECKS MONDAY AND WEDNSDAY BEFORE BREAKFAST). Use as instructed   losartan 25 MG tablet Commonly known as:  COZAAR Take 25 mg by mouth at bedtime.   Melatonin 3 MG Caps Take 3 mg by mouth at bedtime.   metFORMIN  500 MG tablet Commonly known as:  GLUCOPHAGE Take 500 mg by mouth 2 (two) times daily before a meal.   montelukast 10 MG tablet Commonly known as:  SINGULAIR Take 10 mg by mouth daily at 12 noon.   oxymetazoline 0.05 % nasal spray Commonly known as:  AFRIN Place 1 spray into both nostrils as needed (for nose bleeds).   pantoprazole sodium 40 mg/20 mL Pack Commonly known as:  PROTONIX Take 20 mLs (40 mg total) by mouth daily.   TAB-A-VITE Tabs Take 1 tablet by mouth daily at 12 noon.   Travoprost (BAK Free) 0.004 % Soln ophthalmic solution Commonly known as:  TRAVATAN Place 1 drop into both eyes at bedtime.   VENTOLIN HFA 108 (90 Base) MCG/ACT inhaler Generic drug:  albuterol Inhale 2 puffs into the lungs every 6 (six) hours as needed for wheezing or shortness of breath.       No orders of the defined types were placed in this encounter.    There is no immunization history on file for this patient.  Social History   Tobacco Use  . Smoking status: Never Smoker  . Smokeless tobacco: Never Used  Substance Use Topics  . Alcohol use: No    Family history is   Family History  Problem Relation Age of Onset  . Heart attack Mother   . Arthritis Brother   . Skin cancer Brother       Review of Systems  DATA OBTAINED: from patient, nurse GENERAL:  no fevers, fatigue, appetite changes SKIN: No itching, or rash EYES: No eye pain, redness, discharge EARS: No earache, tinnitus, change in hearing NOSE: No congestion, drainage or bleeding  MOUTH/THROAT: No mouth or tooth pain, No sore throat RESPIRATORY: No cough, wheezing, SOB CARDIAC: No chest pain, palpitations, lower extremity edema  GI: No abdominal pain, No N/V/D or constipation, No heartburn or reflux  GU: No dysuria, frequency or urgency, or incontinence  MUSCULOSKELETAL: No unrelieved bone/joint pain NEUROLOGIC: No headache, dizziness or focal weakness PSYCHIATRIC: No c/o anxiety, is sad  Vitals:    09/13/18 1516  BP: (!) 142/66  Pulse: 74  Resp: 20  Temp: (!) 97 F (36.1 C)    SpO2 Readings from Last 1 Encounters:  09/05/18 94%   Body mass index is 36.95 kg/m.     Physical Exam  GENERAL APPEARANCE: Alert, conversant,  No acute distress.  SKIN: No diaphoresis rash HEAD: Normocephalic, atraumatic  EYES: Conjunctiva/lids clear. Pupils round, reactive. EOMs intact.  EARS: External exam WNL, canals clear. Hearing grossly normal.  NOSE: No deformity or discharge.  MOUTH/THROAT: Lips w/o lesions  RESPIRATORY: Breathing is even, unlabored. Lung sounds are clear   CARDIOVASCULAR: Heart RRR no murmurs, rubs or gallops.  No peripheral edema.   GASTROINTESTINAL: Abdomen is soft, non-tender, not distended w/ normal bowel sounds. GENITOURINARY: Bladder non tender, not distended  MUSCULOSKELETAL: No abnormal joints or musculature NEUROLOGIC:  Cranial nerves 2-12 grossly intact. Moves all extremities  PSYCHIATRIC: Mood and affect appropriate to situation, no behavioral issues  Patient Active Problem List   Diagnosis Date Noted  . GERD (gastroesophageal reflux disease) 09/05/2018  . Viral pneumonia 07/03/2018  . Acute respiratory failure with hypoxia (HCC) 07/03/2018  . Hypokalemia 07/03/2018  . Diabetes mellitus type II, controlled (HCC) 07/03/2018  . Depression 07/03/2018  . Essential hypertension   . Sepsis (HCC)   . Altered mental status   . Metabolic encephalopathy 06/17/2018  . Aspiration pneumonitis (HCC) 06/17/2018  . Tachycardia 08/12/2015  . Abnormality of gait 02/05/2015  . Palpitations 05/28/2014  . TIA (transient ischemic attack) 05/25/2014  . Hyperlipidemia associated with type 2 diabetes mellitus (HCC) 05/25/2014  . Parkinson disease (HCC) 05/25/2014  . H/O: CVA (cerebrovascular accident) 05/25/2014  . Paralysis agitans (HCC) 08/31/2013  . Dizziness and giddiness 08/31/2013      Labs reviewed: Basic Metabolic Panel:    Component Value Date/Time   NA  143 08/15/2018   K 4.0 08/15/2018   CL 101 06/20/2018 0541   CO2 29 06/20/2018 0541   GLUCOSE 109 (H) 06/20/2018 0541   BUN 24 (A) 08/15/2018   CREATININE 0.7 08/15/2018   CREATININE 0.79 06/20/2018 0541   CALCIUM 10.2 06/20/2018 0541   PROT 7.4 06/17/2018 1554   ALBUMIN 3.9 06/17/2018 1554   AST 22 08/08/2018   ALT 26 08/08/2018   ALKPHOS 91 08/08/2018   BILITOT 0.8 06/17/2018 1554   GFRNONAA >60 06/20/2018 0541   GFRAA >60 06/20/2018 0541    Recent Labs    06/18/18 0528 06/19/18 0524 06/20/18 0541 08/08/18 08/15/18  NA 140 141 140 142 143  K 3.1* 3.6 3.7 4.0 4.0  CL 103 102 101  --   --   CO2 30 30 29   --   --   GLUCOSE 114* 123* 109*  --   --   BUN 21 20 19 20  24*  CREATININE 0.74 0.80 0.79 0.7 0.7  CALCIUM 9.9 10.0 10.2  --   --    Liver Function Tests: Recent Labs    06/17/18 1554 08/08/18  AST 34 22  ALT 26 26  ALKPHOS 71 91  BILITOT 0.8  --   PROT 7.4  --   ALBUMIN 3.9  --    No results for input(s): LIPASE, AMYLASE in the last 8760 hours. No results for input(s): AMMONIA in the last 8760 hours. CBC: Recent Labs    06/17/18 1554 06/18/18 0528 06/19/18 0524 06/20/18 0541 08/08/18  WBC 13.7* 10.0 7.3 7.3 8.5  8.5  NEUTROABS 10.7*  --  3.8 3.5  --   HGB 12.9 11.2* 11.3* 12.2 11.4*  11.4*  HCT 39.9 34.9* 35.3* 38.0 34*  34*  MCV 93.0 92.1 92.4 92.9  --   PLT 315 314 330 373 354  354   Lipid Recent Labs    08/08/18  CHOL 167  HDL 41  LDLCALC 71  TRIG 277*    Cardiac Enzymes: No results for input(s): CKTOTAL, CKMB, CKMBINDEX, TROPONINI in the last 8760 hours. BNP: No results for input(s): BNP in the last 8760 hours. No results found for: [facility] Lab Results  Component Value Date   HGBA1C 6.2 (A) 08/08/2018   Lab Results  Component Value Date   TSH 3.09  08/08/2018   Lab Results  Component Value Date   VITAMINB12 813 05/26/2014   No results found for: FOLATE No results found for: IRON, TIBC, FERRITIN  Imaging and  Procedures obtained prior to SNF admission: Dg Chest 2 View  Result Date: 06/18/2018 CLINICAL DATA:  Dyspnea EXAM: CHEST - 2 VIEW COMPARISON:  06/17/2018 FINDINGS: Mild cardiac enlargement.  Cardiac loop recorder. Progression of bibasilar airspace disease which may be atelectasis or pneumonia. No effusion or edema IMPRESSION: Progression bibasilar airspace disease. Electronically Signed   By: Marlan Palau M.D.   On: 06/18/2018 09:32   Dg Chest 2 View  Result Date: 06/17/2018 CLINICAL DATA:  Patient BIB per EMS from [facility]. Pt has had altered mental status for x 2 days and a concussion 1 week ago. Pt has had SOB on and off with increased lethargy and tachypnea per EMS. Hx of UTI-being treated with antibiotics.*comment was truncated* EXAM: CHEST - 2 VIEW COMPARISON:  08/12/2015 FINDINGS: Normal cardiac silhouette. No effusion, infiltrate pneumothorax. Mild central venous congestion. Remote RIGHT humerus fracture. Degenerative osteophytosis of the spine. IMPRESSION: No acute cardiopulmonary process. Electronically Signed   By: Genevive Bi M.D.   On: 06/17/2018 16:27   Dg Swallowing Func-speech Pathology  Result Date: 06/18/2018 Objective Swallowing Evaluation: Type of Study: MBS-Modified Barium Swallow Study  Patient Details Name: [name] MRN: [id number] Date of Birth: January 09, 1930 Today's Date: 06/18/2018 Time: SLP Start Time (ACUTE ONLY): 1115 -SLP Stop Time (ACUTE ONLY): 1145 SLP Time Calculation (min) (ACUTE ONLY): 30 min Past Medical History: Past Medical History: Diagnosis Date . Abnormality of gait 02/05/2015 . Anxiety  . Arthritis  . Asthma  . Cerebrovascular disease   CVA '03 . Depression  . Dyslipidemia  . Endometriosis  . GERD (gastroesophageal reflux disease)  . Glaucoma  . Headache(784.0)  . Hypertension  . Macular degeneration   bilateral . Obesity  . Paralysis agitans (HCC) 08/31/2013 . Parkinson disease (HCC)  . TIA (transient ischemic attack)   Multiple Past Surgical  History: Past Surgical History: Procedure Laterality Date . ABDOMINAL HYSTERECTOMY   . CATARACT EXTRACTION, BILATERAL   . GALLBLADDER SURGERY   . LOOP RECORDER IMPLANT  05-28-14  MDT LINQ implanted by Dr Graciela Husbands for cryptogenic stroke/palpitations . LOOP RECORDER IMPLANT N/A 05/28/2014  Procedure: LOOP RECORDER IMPLANT;  Surgeon: Duke Salvia, MD;  Location[facility] CATH LAB;  Service: Cardiovascular;  Laterality: N/A; . PILONIDAL CYST RESECTION   . TILT TABLE STUDY  05-28-14  orthostatic hypertension . TILT TABLE STUDY N/A 05/28/2014  Procedure: TILT TABLE STUDY;  Surgeon: Duke Salvia, MD;  Location: [facility] CATH LAB;  Service: Cardiovascular;  Laterality: N/A; . TONSILLECTOMY   HPI: Kennetha Ludwig Robertsonis a 84 y.o.femalewith medical history significant ofParkinson's disease and ambulatory dysfunction, wheelchair-bound. She was noted to have generalized weakness, malaise and poor appetite over the last 5 days. She was diagnosed with urine tract infection 24 hours after symptoms started and received ciprofloxacin 48 hours after her symptoms started.Despite antibiotic therapy she continued to deteriorate andtoday she was found febrile, dyspneic and ill looking appearing. She was transferred to the hospital for furtherevaluation. On direct questioning patient mentions experiencing worsening dysphagia for the last 5 days, persistent, associated with severe coughing and choking mainly postprandial,no improving or worsening factors. Denies dyspnea but positive wheezing. She has been in a wheelchair for last few years due to her Parkinson's disease. In the outpatient clinic itwas suggested that she hasswallow dysfunction and a swallow evaluation was planned.  Most recent CXR is showing progression of bibasilar airspace disease.   Subjective: The patient was seen in radiology for MBS. Assessment / Plan / Recommendation CHL IP CLINICAL IMPRESSIONS 06/18/2018 Clinical Impression MBS was completed using thin liquids,  nectar thick liquids, pureed material and dry solids.  The patient presented with a mild  oropharyngeal dysphagia.  The oral phase was marked by decreased lingual motion that led to delayed oral transit and oral residue.  She generally displayed good oral control of the bolus but intermittently had some issues with material falling into the buccal area.  The pharyngeal phase was marked by decreased hyo-laryngeal elevation and anterior movement, decreased laryngeal vestibule closure, mildly reduced base of tongue retraction and prominence of the cricopharyngeus.  She had intermittent mild residue at the vallecula.  Penetration into the laryngeal vestibule was seen prior to the swallow given self fed cup sips of thin liquids.   Cued cough and reswallow were successful to clear material.  Chin tuck was also successful to prevent.  Esophageal sweep did not reveal overt issues.  Recommend a regular diet with thin liquids.  Patient should tuck her chin with liquids.  Use of a straw facilitated use of chin tuck.  If patient is unable to use a chin tuck recommend using nectar thick liquids.  ST will follow for therapeutic diet tolerance and to initiate swallowing therapy.  She would benefit from ST follow up at next level of care.   SLP Visit Diagnosis Dysphagia, oropharyngeal phase (R13.12) Attention and concentration deficit following -- Frontal lobe and executive function deficit following -- Impact on safety and function Mild aspiration risk   CHL IP TREATMENT RECOMMENDATION 06/18/2018 Treatment Recommendations Therapy as outlined in treatment plan below   Prognosis 06/18/2018 Prognosis for Safe Diet Advancement Fair Barriers to Reach Goals -- Barriers/Prognosis Comment -- CHL IP DIET RECOMMENDATION 06/18/2018 SLP Diet Recommendations Regular solids;Thin liquid Liquid Administration via Straw Medication Administration Crushed with puree Compensations Slow rate;Small sips/bites;Chin tuck Postural Changes Remain semi-upright  after after feeds/meals (Comment)   CHL IP OTHER RECOMMENDATIONS 06/18/2018 Recommended Consults -- Oral Care Recommendations Oral care BID Other Recommendations --   CHL IP FOLLOW UP RECOMMENDATIONS 06/18/2018 Follow up Recommendations (No Data)   CHL IP FREQUENCY AND DURATION 06/18/2018 Speech Therapy Frequency (ACUTE ONLY) min 2x/week Treatment Duration 2 weeks      CHL IP ORAL PHASE 06/18/2018 Oral Phase Impaired Oral - Pudding Teaspoon -- Oral - Pudding Cup -- Oral - Honey Teaspoon -- Oral - Honey Cup -- Oral - Nectar Teaspoon -- Oral - Nectar Cup Delayed oral transit;Lingual/palatal residue Oral - Nectar Straw -- Oral - Thin Teaspoon Delayed oral transit;Lingual/palatal residue Oral - Thin Cup Lingual/palatal residue;Delayed oral transit Oral - Thin Straw -- Oral - Puree Lingual/palatal residue;Delayed oral transit Oral - Mech Soft -- Oral - Regular Delayed oral transit;Lingual/palatal residue Oral - Multi-Consistency -- Oral - Pill -- Oral Phase - Comment --  CHL IP PHARYNGEAL PHASE 06/18/2018 Pharyngeal Phase Impaired Pharyngeal- Pudding Teaspoon -- Pharyngeal -- Pharyngeal- Pudding Cup -- Pharyngeal -- Pharyngeal- Honey Teaspoon -- Pharyngeal -- Pharyngeal- Honey Cup -- Pharyngeal -- Pharyngeal- Nectar Teaspoon -- Pharyngeal -- Pharyngeal- Nectar Cup Delayed swallow initiation-vallecula Pharyngeal -- Pharyngeal- Nectar Straw -- Pharyngeal -- Pharyngeal- Thin Teaspoon Delayed swallow initiation-vallecula;Reduced laryngeal elevation;Reduced anterior laryngeal mobility Pharyngeal -- Pharyngeal- Thin Cup Delayed swallow initiation-pyriform sinuses;Reduced laryngeal elevation;Reduced anterior laryngeal mobility;Penetration/Aspiration before swallow;Reduced airway/laryngeal closure;Pharyngeal residue - valleculae Pharyngeal Material enters airway, remains ABOVE vocal cords and not ejected  out Pharyngeal- Thin Straw -- Pharyngeal -- Pharyngeal- Puree Delayed swallow initiation-vallecula Pharyngeal -- Pharyngeal-  Mechanical Soft -- Pharyngeal -- Pharyngeal- Regular Delayed swallow initiation-vallecula Pharyngeal -- Pharyngeal- Multi-consistency -- Pharyngeal -- Pharyngeal- Pill -- Pharyngeal -- Pharyngeal Comment --  No flowsheet data found. Dimas Aguas, MA, CCC-SLP Acute Rehab SLP [phone] Fleet Contras 06/18/2018, 12:35 PM                Not all labs, radiology exams or other studies done during hospitalization come through on my EPIC note; however they are reviewed by me.    Assessment and Plan  Side effect of flu shot/grief reaction appropriate- resolving.  Patient did tell me that 3 days ago her grandson's wife was buried.  She was in her 15s.  She had had a long history of kidney problems for which she was seen at [facility] as an outpatient.  She was in pain 1 day and her friend offered her a pill.  She took the pill and died.  The pill was was most likely street fentanyl.  She is very upset about that, as well as over the death of her husband which occurred approximately a year ago and over her daughter's cancer diagnosis year.  We discussed this at some length and patient appeared very appreciative.   Time spent 35 minutes Randon Goldsmith. Lyn Hollingshead, MD

## 2018-09-18 ENCOUNTER — Encounter: Payer: Self-pay | Admitting: Internal Medicine

## 2018-09-23 ENCOUNTER — Non-Acute Institutional Stay (SKILLED_NURSING_FACILITY): Payer: Medicare Other | Admitting: Internal Medicine

## 2018-09-23 ENCOUNTER — Encounter: Payer: Self-pay | Admitting: Internal Medicine

## 2018-09-23 DIAGNOSIS — E1169 Type 2 diabetes mellitus with other specified complication: Secondary | ICD-10-CM | POA: Diagnosis not present

## 2018-09-23 DIAGNOSIS — E785 Hyperlipidemia, unspecified: Secondary | ICD-10-CM

## 2018-09-23 DIAGNOSIS — E119 Type 2 diabetes mellitus without complications: Secondary | ICD-10-CM | POA: Diagnosis not present

## 2018-09-23 DIAGNOSIS — F329 Major depressive disorder, single episode, unspecified: Secondary | ICD-10-CM

## 2018-09-23 DIAGNOSIS — F32A Depression, unspecified: Secondary | ICD-10-CM

## 2018-09-23 NOTE — Progress Notes (Signed)
Location:  [place] Room Number: 741O Place of Service:  SNF (31)  [name], MD  Patient Care Team: Jacklyn Shell, FNP as PCP - General (Nurse Practitioner) Marylynn Pearson, MD as Referring Physician (Internal Medicine)  Extended Emergency Contact Information Primary Emergency Contact: Macky [place] Phone: [phone] Mobile Phone: [phone] Relation: Son Secondary Emergency Contact: [place] Mobile Phone: [phone] Relation: Daughter    Allergies: Comtan [entacapone]; Mirapex [pramipexole dihydrochloride]; Sulfa antibiotics; and Ciprofloxacin  Chief Complaint  Patient presents with  . Medical Management of Chronic Issues    Routine Visit    HPI: Patient is 84 y.o. female who is being seen for routine issues of hyperlipidemia, diabetes mellitus type 2, and depression.  Past Medical History:  Diagnosis Date  . Abnormality of gait 02/05/2015  . Anxiety   . Arthritis   . Asthma   . Cerebrovascular disease    CVA '03  . Depression   . Dyslipidemia   . Endometriosis   . GERD (gastroesophageal reflux disease)   . Glaucoma   . Headache(784.0)   . Hypertension   . Macular degeneration    bilateral  . Obesity   . Paralysis agitans ([place]) 08/31/2013  . Parkinson disease ([place])   . TIA (transient ischemic attack)    Multiple    Past Surgical History:  Procedure Laterality Date  . ABDOMINAL HYSTERECTOMY    . CATARACT EXTRACTION, BILATERAL    . GALLBLADDER SURGERY    . LOOP RECORDER IMPLANT  05-28-14   MDT LINQ implanted by Dr Caryl Comes for cryptogenic stroke/palpitations  . LOOP RECORDER IMPLANT N/A 05/28/2014   Procedure: LOOP RECORDER IMPLANT;  Surgeon: Deboraha Sprang, MD;  Location: [facility] CATH LAB;  Service: Cardiovascular;  Laterality: N/A;  . PILONIDAL CYST RESECTION    . TILT TABLE STUDY  05-28-14   orthostatic hypertension  . TILT TABLE STUDY N/A 05/28/2014   Procedure: TILT  TABLE STUDY;  Surgeon: Deboraha Sprang, MD;  Location: [facility] CATH LAB;  Service: Cardiovascular;  Laterality: N/A;  . TONSILLECTOMY      Allergies as of 09/23/2018      Reactions   Comtan [entacapone] Other (See Comments)   Reaction:  Unknown    Mirapex [pramipexole Dihydrochloride] Other (See Comments)   Reaction:  Unknown    Sulfa Antibiotics Nausea And Vomiting   Ciprofloxacin Other (See Comments)   Reaction:  Made pt feel crazy       Medication List        Accurate as of 09/23/18 11:59 PM. Always use your most recent med list.          Acetaminophen 500 MG coapsule Take 500 mg by mouth 3 (three) times daily.   ALPRAZolam 0.5 MG tablet Commonly known as:  XANAX Take 1 tablet (0.5 mg total) by mouth 3 (three) times daily.   atorvastatin 10 MG tablet Commonly known as:  LIPITOR Take 10 mg by mouth at bedtime.   BIOFREEZE 4 % Gel Generic drug:  Menthol (Topical Analgesic) Apply 1 application topically 3 (three) times daily.   carbidopa-levodopa 25-100 MG tablet Commonly known as:  SINEMET IR Take 1 tablet by mouth every 8 (eight) hours.   clopidogrel 75 MG tablet Commonly known as:  PLAVIX Take 75 mg by mouth daily at 12 noon.   diltiazem 60 MG tablet Commonly known as:  CARDIZEM Take 1 tablet (60 mg total) by mouth every 6 (six) hours.  DULoxetine 30 MG capsule Commonly known as:  CYMBALTA Take 30 mg by mouth daily.   escitalopram 5 MG tablet Commonly known as:  LEXAPRO Take 5 mg by mouth daily.   glucose blood test strip 1 each by Other route as needed for other ([place]). Use as instructed   ipratropium-albuterol 0.5-2.5 (3) MG/3ML Soln Commonly known as:  DUONEB Take 3 mLs by nebulization.   losartan 25 MG tablet Commonly known as:  COZAAR Take 25 mg by mouth at bedtime.   Melatonin 3 MG Caps Take 3 mg by mouth at bedtime.   metFORMIN 500 MG tablet Commonly known as:  GLUCOPHAGE Take 500 mg by mouth 2  (two) times daily before a meal.   montelukast 10 MG tablet Commonly known as:  SINGULAIR Take 10 mg by mouth daily at 12 noon.   oxymetazoline 0.05 % nasal spray Commonly known as:  AFRIN Place 1 spray into both nostrils as needed (for nose bleeds).   pantoprazole sodium 40 mg/20 mL Pack Commonly known as:  PROTONIX Take 20 mLs (40 mg total) by mouth daily.   TAB-A-VITE Tabs Take 1 tablet by mouth daily at 12 noon.   Travoprost (BAK Free) 0.004 % Soln ophthalmic solution Commonly known as:  TRAVATAN Place 1 drop into both eyes at bedtime.   VENTOLIN HFA 108 (90 Base) MCG/ACT inhaler Generic drug:  albuterol Inhale 2 puffs into the lungs every 6 (six) hours as needed for wheezing or shortness of breath.       No orders of the defined types were placed in this encounter.    There is no immunization history on file for this patient.  Social History   Tobacco Use  . Smoking status: Never Smoker  . Smokeless tobacco: Never Used  Substance Use Topics  . Alcohol use: No    Review of Systems  DATA OBTAINED: from patient, nurse GENERAL:  no fevers, fatigue, appetite changes SKIN: No itching, rash HEENT: No complaint RESPIRATORY: No cough, wheezing, SOB CARDIAC: No chest pain, palpitations, lower extremity edema  GI: No abdominal pain, No N/V/D or constipation, No heartburn or reflux  GU: No dysuria, frequency or urgency, or incontinence  MUSCULOSKELETAL: No unrelieved bone/joint pain NEUROLOGIC: No headache, dizziness  PSYCHIATRIC: No overt anxiety:+ sadness  Vitals:   09/23/18 1154  BP: 127/71  Pulse: 83  Resp: 18  Temp: (!) 97.2 F (36.2 C)   Body mass index is 36.13 kg/m. Physical Exam  GENERAL APPEARANCE: Alert, conversant, No acute distress  SKIN: No diaphoresis rash HEENT: Unremarkable RESPIRATORY: Breathing is even, unlabored. Lung sounds are clear   CARDIOVASCULAR: Heart RRR no murmurs, rubs or gallops. No peripheral edema  GASTROINTESTINAL:  Abdomen is soft, non-tender, not distended w/ normal bowel sounds.  GENITOURINARY: Bladder non tender, not distended  MUSCULOSKELETAL: No abnormal joints or musculature NEUROLOGIC: Cranial nerves 2-12 grossly intact. Moves all extremities PSYCHIATRIC: Mood and affect appropriate to situation, no behavioral issues  Patient Active Problem List   Diagnosis Date Noted  . GERD (gastroesophageal reflux disease) 09/05/2018  . Viral pneumonia 07/03/2018  . Acute respiratory failure with hypoxia ([place]) 07/03/2018  . Hypokalemia 07/03/2018  . Diabetes mellitus type II, controlled ([place]) 07/03/2018  . Depression 07/03/2018  . Essential hypertension   . Sepsis ([place])   . Altered mental status   . Metabolic encephalopathy 86/76/7209  . Aspiration pneumonitis ([place]) 06/17/2018  . Tachycardia 08/12/2015  . Abnormality of gait 02/05/2015  . Palpitations 05/28/2014  .  TIA (transient ischemic attack) 05/25/2014  . Hyperlipidemia associated with type 2 diabetes mellitus ([place]) 05/25/2014  . Parkinson disease ([place]) 05/25/2014  . H/O: CVA (cerebrovascular accident) 05/25/2014  . Paralysis agitans ([place]) 08/31/2013  . Dizziness and giddiness 08/31/2013    CMP     Component Value Date/Time   NA 143 08/15/2018   K 4.0 08/15/2018   CL 101 06/20/2018 0541   CO2 29 06/20/2018 0541   GLUCOSE 109 (H) 06/20/2018 0541   BUN 24 (A) 08/15/2018   CREATININE 0.7 08/15/2018   CREATININE 0.79 06/20/2018 0541   CALCIUM 10.2 06/20/2018 0541   PROT 7.4 06/17/2018 1554   ALBUMIN 3.9 06/17/2018 1554   AST 22 08/08/2018   ALT 26 08/08/2018   ALKPHOS 91 08/08/2018   BILITOT 0.8 06/17/2018 1554   GFRNONAA >60 06/20/2018 0541   GFRAA >60 06/20/2018 0541   Recent Labs    06/18/18 0528 06/19/18 0524 06/20/18 0541 08/08/18 08/15/18  NA 140 141 140 142 143  K 3.1* 3.6 3.7 4.0 4.0  CL 103 102 101  --   --   CO2 30 30 29   --   --   GLUCOSE 114* 123* 109*  --   --   BUN 21 20 19 20  24*  CREATININE 0.74 0.80 0.79  0.7 0.7  CALCIUM 9.9 10.0 10.2  --   --    Recent Labs    06/17/18 1554 08/08/18  AST 34 22  ALT 26 26  ALKPHOS 71 91  BILITOT 0.8  --   PROT 7.4  --   ALBUMIN 3.9  --    Recent Labs    06/17/18 1554 06/18/18 0528 06/19/18 0524 06/20/18 0541 08/08/18  WBC 13.7* 10.0 7.3 7.3 8.5  8.5  NEUTROABS 10.7*  --  3.8 3.5  --   HGB 12.9 11.2* 11.3* 12.2 11.4*  11.4*  HCT 39.9 34.9* 35.3* 38.0 34*  34*  MCV 93.0 92.1 92.4 92.9  --   PLT 315 314 330 373 354  354   Recent Labs    08/08/18  CHOL 167  LDLCALC 71  TRIG 277*   No results found for: [facility] Lab Results  Component Value Date   TSH 3.09 08/08/2018   Lab Results  Component Value Date   HGBA1C 6.2 (A) 08/08/2018   Lab Results  Component Value Date   CHOL 167 08/08/2018   HDL 41 08/08/2018   LDLCALC 71 08/08/2018   TRIG 277 (A) 08/08/2018   CHOLHDL 5.3 05/26/2014    Significant Diagnostic Results in last 30 days:  No results found.  Assessment and Plan  Hyperlipidemia associated with type 2 diabetes mellitus ([place]) LDL 71, HDL 41, good control; continue Lipitor 10 mg daily  Diabetes mellitus type II, controlled ([place]) Most recent hemoglobin A1c 5.6, good control; continue Glucophage 500 mg twice daily; patient is on statin and patient is on a ARB  Depression Patient has 2  family losses and one new cancer diagnosis and her family this year; depression is fairly well controlled on Cymbalta 30 mg daily and Lexapro 5 mg daily    Brentin Shin D. [name], MD

## 2018-09-25 ENCOUNTER — Encounter: Payer: Self-pay | Admitting: Internal Medicine

## 2018-09-25 NOTE — Assessment & Plan Note (Signed)
Patient has 2  family losses and one new cancer diagnosis and her family this year; depression is fairly well controlled on Cymbalta 30 mg daily and Lexapro 5 mg daily

## 2018-09-25 NOTE — Assessment & Plan Note (Signed)
Most recent hemoglobin A1c 5.6, good control; continue Glucophage 500 mg twice daily; patient is on statin and patient is on a ARB

## 2018-09-25 NOTE — Assessment & Plan Note (Signed)
LDL 71, HDL 41, good control; continue Lipitor 10 mg daily

## 2018-10-07 ENCOUNTER — Encounter: Payer: Self-pay | Admitting: Internal Medicine

## 2018-10-07 ENCOUNTER — Non-Acute Institutional Stay (SKILLED_NURSING_FACILITY): Payer: Medicare Other | Admitting: Internal Medicine

## 2018-10-07 DIAGNOSIS — R6889 Other general symptoms and signs: Secondary | ICD-10-CM

## 2018-10-07 NOTE — Progress Notes (Signed)
:   Location:  [facility] Room Number: 317P Place of Service:  SNF (31)  Orrie Schubert D. Lyn Hollingshead, MD  Patient Care Team: Manus Gunning, FNP as PCP - General (Nurse Practitioner) Herschel Senegal, MD as Referring Physician (Internal Medicine)  Extended Emergency Contact Information Primary Emergency Contact: Dyke Maes [place] Home Phone: [phone] Mobile Phone: [phone] Relation: Son Secondary Emergency Contact: Miller,Laurie Mobile Phone: [phone] Relation: Daughter     Allergies: Comtan [entacapone]; Mirapex [pramipexole dihydrochloride]; Sulfa antibiotics; and Ciprofloxacin  Chief Complaint  Patient presents with  . Acute Visit    HPI: Patient is 84 y.o. female who nursing asked me to see because she is not acting "right".  Patient's daughter spoke to her over the phone and is afraid she is having a stroke.  Patient herself does not believe she is having a stroke but she does admit that she is tired today and said about her husband and wants to stay in bed.  She does complain of having a dry mouth for several days.  Today she complains that she is got a little bit of a twitch in her left leg.  I am noting that patient while she is speaking slowly is speaking clearly and does make sense.  Patient denies any numbness tingling or weakness of arm or leg or face.  Patient was able to eat breakfast.  Past Medical History:  Diagnosis Date  . Abnormality of gait 02/05/2015  . Anxiety   . Arthritis   . Asthma   . Cerebrovascular disease    CVA '03  . Depression   . Dyslipidemia   . Endometriosis   . GERD (gastroesophageal reflux disease)   . Glaucoma   . Headache(784.0)   . Hypertension   . Macular degeneration    bilateral  . Obesity   . Paralysis agitans (HCC) 08/31/2013  . Parkinson disease (HCC)   . TIA (transient ischemic attack)    Multiple    Past Surgical History:  Procedure Laterality Date  .  ABDOMINAL HYSTERECTOMY    . CATARACT EXTRACTION, BILATERAL    . GALLBLADDER SURGERY    . LOOP RECORDER IMPLANT  05-28-14   MDT LINQ implanted by Dr Graciela Husbands for cryptogenic stroke/palpitations  . LOOP RECORDER IMPLANT N/A 05/28/2014   Procedure: LOOP RECORDER IMPLANT;  Surgeon: Duke Salvia, MD;  Location: [facility] CATH LAB;  Service: Cardiovascular;  Laterality: N/A;  . PILONIDAL CYST RESECTION    . TILT TABLE STUDY  05-28-14   orthostatic hypertension  . TILT TABLE STUDY N/A 05/28/2014   Procedure: TILT TABLE STUDY;  Surgeon: Duke Salvia, MD;  Location: [facility] CATH LAB;  Service: Cardiovascular;  Laterality: N/A;  . TONSILLECTOMY      Allergies as of 10/07/2018      Reactions   Comtan [entacapone] Other (See Comments)   Reaction:  Unknown    Mirapex [pramipexole Dihydrochloride] Other (See Comments)   Reaction:  Unknown    Sulfa Antibiotics Nausea And Vomiting   Ciprofloxacin Other (See Comments)   Reaction:  Made pt feel crazy       Medication List        Accurate as of 10/07/18  4:02 PM. Always use your most recent med list.          Acetaminophen 500 MG coapsule Take 500 mg by mouth 3 (three) times daily.   ALPRAZolam 0.5 MG tablet Commonly known as:  XANAX Take 1 tablet (0.5 mg total)  by mouth 3 (three) times daily.   atorvastatin 10 MG tablet Commonly known as:  LIPITOR Take 10 mg by mouth at bedtime.   BIOFREEZE 4 % Gel Generic drug:  Menthol (Topical Analgesic) Apply 1 application topically 3 (three) times daily.   busPIRone 5 MG tablet Commonly known as:  BUSPAR Take 5 mg by mouth 2 (two) times daily.   carbidopa-levodopa 25-100 MG tablet Commonly known as:  SINEMET IR Take 1 tablet by mouth every 8 (eight) hours.   clopidogrel 75 MG tablet Commonly known as:  PLAVIX Take 75 mg by mouth daily at 12 noon.   diltiazem 60 MG tablet Commonly known as:  CARDIZEM Take 1 tablet (60 mg total) by mouth every 6 (six) hours.   DULoxetine 60 MG capsule Commonly known  as:  CYMBALTA Take 60 mg by mouth daily.   escitalopram 5 MG tablet Commonly known as:  LEXAPRO Take 5 mg by mouth daily.   glucose blood test strip 1 each by Other route as needed for other (ACCU-CHECKS MONDAY AND WEDNSDAY BEFORE BREAKFAST). Use as instructed   ipratropium-albuterol 0.5-2.5 (3) MG/3ML Soln Commonly known as:  DUONEB Take 3 mLs by nebulization.   losartan 25 MG tablet Commonly known as:  COZAAR Take 25 mg by mouth at bedtime.   Melatonin 3 MG Caps Take 3 mg by mouth at bedtime.   metFORMIN 500 MG tablet Commonly known as:  GLUCOPHAGE Take 500 mg by mouth 2 (two) times daily before a meal.   montelukast 10 MG tablet Commonly known as:  SINGULAIR Take 10 mg by mouth daily at 12 noon.   oxymetazoline 0.05 % nasal spray Commonly known as:  AFRIN Place 1 spray into both nostrils as needed (for nose bleeds).   pantoprazole sodium 40 mg/20 mL Pack Commonly known as:  PROTONIX Take 20 mLs (40 mg total) by mouth daily.   SEROQUEL 25 MG tablet Generic drug:  QUEtiapine Take 25 mg by mouth at bedtime.   TAB-A-VITE Tabs Take 1 tablet by mouth daily at 12 noon.   Travoprost (BAK Free) 0.004 % Soln ophthalmic solution Commonly known as:  TRAVATAN Place 1 drop into both eyes at bedtime.   VENTOLIN HFA 108 (90 Base) MCG/ACT inhaler Generic drug:  albuterol Inhale 2 puffs into the lungs every 6 (six) hours as needed for wheezing or shortness of breath.       No orders of the defined types were placed in this encounter.    There is no immunization history on file for this patient.  Social History   Tobacco Use  . Smoking status: Never Smoker  . Smokeless tobacco: Never Used  Substance Use Topics  . Alcohol use: No    Family history is   Family History  Problem Relation Age of Onset  . Heart attack Mother   . Arthritis Brother   . Skin cancer Brother       Review of Systems  DATA OBTAINED: from patient, nurse, family member GENERAL:  no  fevers, fatigue, appetite changes SKIN: No itching, or rash EYES: No eye pain, redness, discharge EARS: No earache, tinnitus, change in hearing NOSE: No congestion, drainage or bleeding  MOUTH/THROAT: No mouth or tooth pain, No sore throat RESPIRATORY: No cough, wheezing, SOB CARDIAC: No chest pain, palpitations, lower extremity edema  GI: No abdominal pain, No N/V/D or constipation, No heartburn or reflux  GU: No dysuria, frequency or urgency, or incontinence  MUSCULOSKELETAL: No unrelieved bone/joint pain NEUROLOGIC: No headache,  dizziness or focal weakness PSYCHIATRIC: No c/o anxiety or sadness   Vitals:   10/07/18 1546  BP: 122/68  Pulse: 82  Resp: 19  Temp: 98.4 F (36.9 C)    SpO2 Readings from Last 1 Encounters:  09/05/18 94%   Body mass index is 36.13 kg/m.     Physical Exam  GENERAL APPEARANCE: Appears slightly sleepy, conversant,  No acute distress.  SKIN: No diaphoresis rash HEAD: Normocephalic, atraumatic  EYES: Conjunctiva/lids clear. Pupils round, reactive. EOMs intact.  EARS: External exam WNL, canals clear. Hearing grossly normal.  NOSE: No deformity or discharge.  MOUTH/THROAT: Lips w/o lesions  RESPIRATORY: Breathing is even, unlabored. Lung sounds are clear   CARDIOVASCULAR: Heart RRR no murmurs, rubs or gallops. No peripheral edema.   GASTROINTESTINAL: Abdomen is soft, non-tender, not distended w/ normal bowel sounds. GENITOURINARY: Bladder non tender, not distended  MUSCULOSKELETAL: No abnormal joints or musculature NEUROLOGIC:  Cranial nerves 2-12 grossly intact. Moves all extremities; patient speaks slowly but clearly and makes sense; exhibits a upper level function like humor PSYCHIATRIC: Mood and affect appropriate to situation, no behavioral issues  Patient Active Problem List   Diagnosis Date Noted  . GERD (gastroesophageal reflux disease) 09/05/2018  . Viral pneumonia 07/03/2018  . Acute respiratory failure with hypoxia (HCC) 07/03/2018    . Hypokalemia 07/03/2018  . Diabetes mellitus type II, controlled (HCC) 07/03/2018  . Depression 07/03/2018  . Essential hypertension   . Sepsis (HCC)   . Altered mental status   . Metabolic encephalopathy 06/17/2018  . Aspiration pneumonitis (HCC) 06/17/2018  . Tachycardia 08/12/2015  . Abnormality of gait 02/05/2015  . Palpitations 05/28/2014  . TIA (transient ischemic attack) 05/25/2014  . Hyperlipidemia associated with type 2 diabetes mellitus (HCC) 05/25/2014  . Parkinson disease (HCC) 05/25/2014  . H/O: CVA (cerebrovascular accident) 05/25/2014  . Paralysis agitans (HCC) 08/31/2013  . Dizziness and giddiness 08/31/2013      Labs reviewed: Basic Metabolic Panel:    Component Value Date/Time   NA 143 08/15/2018   K 4.0 08/15/2018   CL 101 06/20/2018 0541   CO2 29 06/20/2018 0541   GLUCOSE 109 (H) 06/20/2018 0541   BUN 24 (A) 08/15/2018   CREATININE 0.7 08/15/2018   CREATININE 0.79 06/20/2018 0541   CALCIUM 10.2 06/20/2018 0541   PROT 7.4 06/17/2018 1554   ALBUMIN 3.9 06/17/2018 1554   AST 22 08/08/2018   ALT 26 08/08/2018   ALKPHOS 91 08/08/2018   BILITOT 0.8 06/17/2018 1554   GFRNONAA >60 06/20/2018 0541   GFRAA >60 06/20/2018 0541    Recent Labs    06/18/18 0528 06/19/18 0524 06/20/18 0541 08/08/18 08/15/18  NA 140 141 140 142 143  K 3.1* 3.6 3.7 4.0 4.0  CL 103 102 101  --   --   CO2 30 30 29   --   --   GLUCOSE 114* 123* 109*  --   --   BUN 21 20 19 20  24*  CREATININE 0.74 0.80 0.79 0.7 0.7  CALCIUM 9.9 10.0 10.2  --   --    Liver Function Tests: Recent Labs    06/17/18 1554 08/08/18  AST 34 22  ALT 26 26  ALKPHOS 71 91  BILITOT 0.8  --   PROT 7.4  --   ALBUMIN 3.9  --    No results for input(s): LIPASE, AMYLASE in the last 8760 hours. No results for input(s): AMMONIA in the last 8760 hours. CBC: Recent Labs    06/17/18 1554  06/18/18 0528 06/19/18 0524 06/20/18 0541 08/08/18  WBC 13.7* 10.0 7.3 7.3 8.5  8.5  NEUTROABS 10.7*  --   3.8 3.5  --   HGB 12.9 11.2* 11.3* 12.2 11.4*  11.4*  HCT 39.9 34.9* 35.3* 38.0 34*  34*  MCV 93.0 92.1 92.4 92.9  --   PLT 315 314 330 373 354  354   Lipid Recent Labs    08/08/18  CHOL 167  HDL 41  LDLCALC 71  TRIG 277*    Cardiac Enzymes: No results for input(s): CKTOTAL, CKMB, CKMBINDEX, TROPONINI in the last 8760 hours. BNP: No results for input(s): BNP in the last 8760 hours. No results found for: [facility] Lab Results  Component Value Date   HGBA1C 6.2 (A) 08/08/2018   Lab Results  Component Value Date   TSH 3.09 08/08/2018   Lab Results  Component Value Date   VITAMINB12 813 05/26/2014   No results found for: FOLATE No results found for: IRON, TIBC, FERRITIN  Imaging and Procedures obtained prior to SNF admission: Dg Chest 2 View  Result Date: 06/18/2018 CLINICAL DATA:  Dyspnea EXAM: CHEST - 2 VIEW COMPARISON:  06/17/2018 FINDINGS: Mild cardiac enlargement.  Cardiac loop recorder. Progression of bibasilar airspace disease which may be atelectasis or pneumonia. No effusion or edema IMPRESSION: Progression bibasilar airspace disease. Electronically Signed   By: Marlan Palau M.D.   On: 06/18/2018 09:32   Dg Chest 2 View  Result Date: 06/17/2018 CLINICAL DATA:  Patient BIB per EMS from [facility]. Pt has had altered mental status for x 2 days and a concussion 1 week ago. Pt has had SOB on and off with increased lethargy and tachypnea per EMS. Hx of UTI-being treated with antibiotics.*comment was truncated* EXAM: CHEST - 2 VIEW COMPARISON:  08/12/2015 FINDINGS: Normal cardiac silhouette. No effusion, infiltrate pneumothorax. Mild central venous congestion. Remote RIGHT humerus fracture. Degenerative osteophytosis of the spine. IMPRESSION: No acute cardiopulmonary process. Electronically Signed   By: Genevive Bi M.D.   On: 06/17/2018 16:27   Dg Swallowing Func-speech Pathology  Result Date: 06/18/2018 Objective Swallowing Evaluation: Type of Study:  MBS-Modified Barium Swallow Study  Patient Details Name: LORRIANE RAJSKI MRN: [id number] Date of Birth: 04-Apr-1930 Today's Date: 06/18/2018 Time: SLP Start Time (ACUTE ONLY): 1115 -SLP Stop Time (ACUTE ONLY): 1145 SLP Time Calculation (min) (ACUTE ONLY): 30 min Past Medical History: Past Medical History: Diagnosis Date . Abnormality of gait 02/05/2015 . Anxiety  . Arthritis  . Asthma  . Cerebrovascular disease   CVA '03 . Depression  . Dyslipidemia  . Endometriosis  . GERD (gastroesophageal reflux disease)  . Glaucoma  . Headache(784.0)  . Hypertension  . Macular degeneration   bilateral . Obesity  . Paralysis agitans (HCC) 08/31/2013 . Parkinson disease (HCC)  . TIA (transient ischemic attack)   Multiple Past Surgical History: Past Surgical History: Procedure Laterality Date . ABDOMINAL HYSTERECTOMY   . CATARACT EXTRACTION, BILATERAL   . GALLBLADDER SURGERY   . LOOP RECORDER IMPLANT  05-28-14  MDT LINQ implanted by Dr Graciela Husbands for cryptogenic stroke/palpitations . LOOP RECORDER IMPLANT N/A 05/28/2014  Procedure: LOOP RECORDER IMPLANT;  Surgeon: Duke Salvia, MD;  Location: [facility] CATH LAB;  Service: Cardiovascular;  Laterality: N/A; . PILONIDAL CYST RESECTION   . TILT TABLE STUDY  05-28-14  orthostatic hypertension . TILT TABLE STUDY N/A 05/28/2014  Procedure: TILT TABLE STUDY;  Surgeon: Duke Salvia, MD;  Location: [facility] CATH LAB;  Service: Cardiovascular;  Laterality: N/A; . TONSILLECTOMY  HPI: Macey Brieger Robertsonis a 84 y.o.femalewith medical history significant ofParkinson's disease and ambulatory dysfunction, wheelchair-bound. She was noted to have generalized weakness, malaise and poor appetite over the last 5 days. She was diagnosed with urine tract infection 24 hours after symptoms started and received ciprofloxacin 48 hours after her symptoms started.Despite antibiotic therapy she continued to deteriorate andtoday she was found febrile, dyspneic and ill looking appearing. She was transferred to the hospital  for furtherevaluation. On direct questioning patient mentions experiencing worsening dysphagia for the last 5 days, persistent, associated with severe coughing and choking mainly postprandial,no improving or worsening factors. Denies dyspnea but positive wheezing. She has been in a wheelchair for last few years due to her Parkinson's disease. In the outpatient clinic itwas suggested that she hasswallow dysfunction and a swallow evaluation was planned.  Most recent CXR is showing progression of bibasilar airspace disease.   Subjective: The patient was seen in radiology for MBS. Assessment / Plan / Recommendation CHL IP CLINICAL IMPRESSIONS 06/18/2018 Clinical Impression MBS was completed using thin liquids, nectar thick liquids, pureed material and dry solids.  The patient presented with a mild  oropharyngeal dysphagia.  The oral phase was marked by decreased lingual motion that led to delayed oral transit and oral residue.  She generally displayed good oral control of the bolus but intermittently had some issues with material falling into the buccal area.  The pharyngeal phase was marked by decreased hyo-laryngeal elevation and anterior movement, decreased laryngeal vestibule closure, mildly reduced base of tongue retraction and prominence of the cricopharyngeus.  She had intermittent mild residue at the vallecula.  Penetration into the laryngeal vestibule was seen prior to the swallow given self fed cup sips of thin liquids.   Cued cough and reswallow were successful to clear material.  Chin tuck was also successful to prevent.  Esophageal sweep did not reveal overt issues.  Recommend a regular diet with thin liquids.  Patient should tuck her chin with liquids.  Use of a straw facilitated use of chin tuck.  If patient is unable to use a chin tuck recommend using nectar thick liquids.  ST will follow for therapeutic diet tolerance and to initiate swallowing therapy.  She would benefit from ST follow up at  next level of care.   SLP Visit Diagnosis Dysphagia, oropharyngeal phase (R13.12) Attention and concentration deficit following -- Frontal lobe and executive function deficit following -- Impact on safety and function Mild aspiration risk   CHL IP TREATMENT RECOMMENDATION 06/18/2018 Treatment Recommendations Therapy as outlined in treatment plan below   Prognosis 06/18/2018 Prognosis for Safe Diet Advancement Fair Barriers to Reach Goals -- Barriers/Prognosis Comment -- CHL IP DIET RECOMMENDATION 06/18/2018 SLP Diet Recommendations Regular solids;Thin liquid Liquid Administration via Straw Medication Administration Crushed with puree Compensations Slow rate;Small sips/bites;Chin tuck Postural Changes Remain semi-upright after after feeds/meals (Comment)   CHL IP OTHER RECOMMENDATIONS 06/18/2018 Recommended Consults -- Oral Care Recommendations Oral care BID Other Recommendations --   CHL IP FOLLOW UP RECOMMENDATIONS 06/18/2018 Follow up Recommendations (No Data)   CHL IP FREQUENCY AND DURATION 06/18/2018 Speech Therapy Frequency (ACUTE ONLY) min 2x/week Treatment Duration 2 weeks      CHL IP ORAL PHASE 06/18/2018 Oral Phase Impaired Oral - Pudding Teaspoon -- Oral - Pudding Cup -- Oral - Honey Teaspoon -- Oral - Honey Cup -- Oral - Nectar Teaspoon -- Oral - Nectar Cup Delayed oral transit;Lingual/palatal residue Oral - Nectar Straw -- Oral - Thin Teaspoon Delayed oral transit;Lingual/palatal residue Oral -  Thin Cup Lingual/palatal residue;Delayed oral transit Oral - Thin Straw -- Oral - Puree Lingual/palatal residue;Delayed oral transit Oral - Mech Soft -- Oral - Regular Delayed oral transit;Lingual/palatal residue Oral - Multi-Consistency -- Oral - Pill -- Oral Phase - Comment --  CHL IP PHARYNGEAL PHASE 06/18/2018 Pharyngeal Phase Impaired Pharyngeal- Pudding Teaspoon -- Pharyngeal -- Pharyngeal- Pudding Cup -- Pharyngeal -- Pharyngeal- Honey Teaspoon -- Pharyngeal -- Pharyngeal- Honey Cup -- Pharyngeal -- Pharyngeal-  Nectar Teaspoon -- Pharyngeal -- Pharyngeal- Nectar Cup Delayed swallow initiation-vallecula Pharyngeal -- Pharyngeal- Nectar Straw -- Pharyngeal -- Pharyngeal- Thin Teaspoon Delayed swallow initiation-vallecula;Reduced laryngeal elevation;Reduced anterior laryngeal mobility Pharyngeal -- Pharyngeal- Thin Cup Delayed swallow initiation-pyriform sinuses;Reduced laryngeal elevation;Reduced anterior laryngeal mobility;Penetration/Aspiration before swallow;Reduced airway/laryngeal closure;Pharyngeal residue - valleculae Pharyngeal Material enters airway, remains ABOVE vocal cords and not ejected out Pharyngeal- Thin Straw -- Pharyngeal -- Pharyngeal- Puree Delayed swallow initiation-vallecula Pharyngeal -- Pharyngeal- Mechanical Soft -- Pharyngeal -- Pharyngeal- Regular Delayed swallow initiation-vallecula Pharyngeal -- Pharyngeal- Multi-consistency -- Pharyngeal -- Pharyngeal- Pill -- Pharyngeal -- Pharyngeal Comment --  No flowsheet data found. Dimas Aguas, MA, CCC-SLP Acute Rehab SLP [phone] Fleet Contras 06/18/2018, 12:35 PM                Not all labs, radiology exams or other studies done during hospitalization come through on my EPIC note; however they are reviewed by me.    Assessment and Plan  Acting "not right"- I know this patient well, we have had many conversations, long conversations.  In addition to her grief of the 1 year anniversary of her husband in the upcoming Christmas season I do believe that her symptoms can be caused by the Seroquel that she has been on for several days and that is being DC'd today.  I spoke to patient's daughter on the phone as well to discuss her concerns.   Spent greater than 35 minutes Marisel Tostenson D. Lyn Hollingshead, MD

## 2018-10-16 ENCOUNTER — Encounter: Payer: Self-pay | Admitting: Internal Medicine

## 2018-10-24 ENCOUNTER — Non-Acute Institutional Stay (SKILLED_NURSING_FACILITY): Payer: Medicare Other | Admitting: Internal Medicine

## 2018-10-24 ENCOUNTER — Encounter: Payer: Self-pay | Admitting: Internal Medicine

## 2018-10-24 DIAGNOSIS — Z8673 Personal history of transient ischemic attack (TIA), and cerebral infarction without residual deficits: Secondary | ICD-10-CM | POA: Diagnosis not present

## 2018-10-24 DIAGNOSIS — F329 Major depressive disorder, single episode, unspecified: Secondary | ICD-10-CM

## 2018-10-24 DIAGNOSIS — I10 Essential (primary) hypertension: Secondary | ICD-10-CM | POA: Diagnosis not present

## 2018-10-24 DIAGNOSIS — F32A Depression, unspecified: Secondary | ICD-10-CM

## 2018-10-24 NOTE — Progress Notes (Signed)
Location:  [place] Room Number: 458K Place of Service:  SNF (31)  [name]. [name], MD  Patient Care Team: Jacklyn Shell, FNP as PCP - General (Nurse Practitioner) Marylynn Pearson, MD as Referring Physician (Internal Medicine)  Extended Emergency Contact Information Primary Emergency Contact: [name] [place] of [place] Phone: [phone] Mobile Phone: [phone] Relation: Son Secondary Emergency Contact: [name] Mobile Phone: [phone] Relation: Daughter    Allergies: Comtan [entacapone]; Mirapex [pramipexole dihydrochloride]; Sulfa antibiotics; and Ciprofloxacin  Chief Complaint  Patient presents with  . Medical Management of Chronic Issues    Routine Visit    HPI: Patient is 84 y.o. female who is being seen for routine issues of hypertension, history of CVA, and depression.  Past Medical History:  Diagnosis Date  . Abnormality of gait 02/05/2015  . Anxiety   . Arthritis   . Asthma   . Cerebrovascular disease    CVA '03  . Depression   . Dyslipidemia   . Endometriosis   . GERD (gastroesophageal reflux disease)   . Glaucoma   . Headache(784.0)   . Hypertension   . Macular degeneration    bilateral  . Obesity   . Paralysis agitans ([place]) 08/31/2013  . Parkinson disease ([place])   . TIA (transient ischemic attack)    Multiple    Past Surgical History:  Procedure Laterality Date  . ABDOMINAL HYSTERECTOMY    . CATARACT EXTRACTION, BILATERAL    . GALLBLADDER SURGERY    . LOOP RECORDER IMPLANT  05-28-14   MDT LINQ implanted by Dr Caryl Comes for cryptogenic stroke/palpitations  . LOOP RECORDER IMPLANT N/A 05/28/2014   Procedure: LOOP RECORDER IMPLANT;  Surgeon: Deboraha Sprang, MD;  Location: [facility] CATH LAB;  Service: Cardiovascular;  Laterality: N/A;  . PILONIDAL CYST RESECTION    . TILT TABLE STUDY  05-28-14   orthostatic hypertension  . TILT TABLE STUDY N/A 05/28/2014   Procedure: TILT TABLE STUDY;   Surgeon: Deboraha Sprang, MD;  Location: [facility] CATH LAB;  Service: Cardiovascular;  Laterality: N/A;  . TONSILLECTOMY      Allergies as of 10/24/2018      Reactions   Comtan [entacapone] Other (See Comments)   Reaction:  Unknown    Mirapex [pramipexole Dihydrochloride] Other (See Comments)   Reaction:  Unknown    Sulfa Antibiotics Nausea And Vomiting   Ciprofloxacin Other (See Comments)   Reaction:  Made pt feel crazy       Medication List       Accurate as of October 24, 2018 11:59 PM. Always use your most recent med list.        Acetaminophen 500 MG coapsule Take 500 mg by mouth 3 (three) times daily.   ALPRAZolam 0.5 MG tablet Commonly known as:  XANAX Take 1 tablet (0.5 mg total) by mouth 3 (three) times daily.   atorvastatin 10 MG tablet Commonly known as:  LIPITOR Take 10 mg by mouth at bedtime.   BIOFREEZE 4 % Gel Generic drug:  Menthol (Topical Analgesic) Apply 1 application topically 3 (three) times daily.   busPIRone 5 MG tablet Commonly known as:  BUSPAR Take 5 mg by mouth 2 (two) times daily.   carbidopa-levodopa 25-100 MG tablet Commonly known as:  SINEMET IR Take 1 tablet by mouth every 8 (eight) hours.   clopidogrel 75 MG tablet Commonly known as:  PLAVIX Take 75 mg by mouth daily at 12 noon.   diltiazem 60 MG tablet Commonly  known as:  CARDIZEM Take 1 tablet (60 mg total) by mouth every 6 (six) hours.   DULoxetine 60 MG capsule Commonly known as:  CYMBALTA Take 60 mg by mouth daily.   escitalopram 5 MG tablet Commonly known as:  LEXAPRO Take 5 mg by mouth daily.   glucose blood test strip 1 each by Other route as needed for other ([place]). Use as instructed   losartan 25 MG tablet Commonly known as:  COZAAR Take 25 mg by mouth at bedtime.   Melatonin 3 MG Caps Take 3 mg by mouth at bedtime.   metFORMIN 500 MG tablet Commonly known as:  GLUCOPHAGE Take 500 mg by mouth 2 (two) times daily  before a meal.   montelukast 10 MG tablet Commonly known as:  SINGULAIR Take 10 mg by mouth daily at 12 noon.   oxymetazoline 0.05 % nasal spray Commonly known as:  AFRIN Place 1 spray into both nostrils as needed (for nose bleeds).   pantoprazole sodium 40 mg/20 mL Pack Commonly known as:  PROTONIX Take 20 mLs (40 mg total) by mouth daily.   TAB-A-VITE Tabs Take 1 tablet by mouth daily at 12 noon.   Travoprost (BAK Free) 0.004 % Soln ophthalmic solution Commonly known as:  TRAVATAN Place 1 drop into both eyes at bedtime.   VENTOLIN HFA 108 (90 Base) MCG/ACT inhaler Generic drug:  albuterol Inhale 2 puffs into the lungs every 6 (six) hours as needed for wheezing or shortness of breath.       No orders of the defined types were placed in this encounter.    There is no immunization history on file for this patient.  Social History   Tobacco Use  . Smoking status: Never Smoker  . Smokeless tobacco: Never Used  Substance Use Topics  . Alcohol use: No    Review of Systems  DATA OBTAINED: from patient, nurse GENERAL:  no fevers, fatigue, appetite changes SKIN: No itching, rash HEENT: No complaint RESPIRATORY: No cough, wheezing, SOB CARDIAC: No chest pain, palpitations, Abbott extremity edema  GI: No abdominal pain, No N/V/D or constipation, No heartburn or reflux  GU: No dysuria, frequency or urgency, or incontinence  MUSCULOSKELETAL: No unrelieved bone/joint pain NEUROLOGIC: No headache, dizziness  PSYCHIATRIC: + overt anxiety and sadness  Vitals:   10/24/18 0906  BP: 140/70  Pulse: 80  Resp: 20  Temp: 98.8 F (37.1 C)   Body mass index is 35.8 kg/m. Physical Exam  GENERAL APPEARANCE: Alert, conversant, No acute distress  SKIN: No diaphoresis rash HEENT: Unremarkable RESPIRATORY: Breathing is even, unlabored. Lung sounds are clear   CARDIOVASCULAR: Heart RRR no murmurs, rubs or gallops. No peripheral edema  GASTROINTESTINAL: Abdomen is soft,  non-tender, not distended w/ normal bowel sounds.  GENITOURINARY: Bladder non tender, not distended  MUSCULOSKELETAL: No abnormal joints or musculature NEUROLOGIC: Cranial nerves 2-12 grossly intact. Moves all extremities PSYCHIATRIC: Mood and affect appropriate to situation which is patient is sad, no behavioral issues  Patient Active Problem List   Diagnosis Date Noted  . GERD (gastroesophageal reflux disease) 09/05/2018  . Viral pneumonia 07/03/2018  . Acute respiratory failure with hypoxia ([place]) 07/03/2018  . Hypokalemia 07/03/2018  . Diabetes mellitus type II, controlled ([place]) 07/03/2018  . Depression 07/03/2018  . Essential hypertension   . Sepsis ([place])   . Altered mental status   . Metabolic encephalopathy 16/60/6301  . Aspiration pneumonitis ([place]) 06/17/2018  . Tachycardia 08/12/2015  . Abnormality of gait  02/05/2015  . Palpitations 05/28/2014  . TIA (transient ischemic attack) 05/25/2014  . Hyperlipidemia associated with type 2 diabetes mellitus ([place]) 05/25/2014  . Parkinson disease ([place]) 05/25/2014  . H/O: CVA (cerebrovascular accident) 05/25/2014  . Paralysis agitans ([place]) 08/31/2013  . Dizziness and giddiness 08/31/2013    CMP     Component Value Date/Time   NA 143 08/15/2018   K 4.0 08/15/2018   CL 101 06/20/2018 0541   CO2 29 06/20/2018 0541   GLUCOSE 109 (H) 06/20/2018 0541   BUN 24 (A) 08/15/2018   CREATININE 0.7 08/15/2018   CREATININE 0.79 06/20/2018 0541   CALCIUM 10.2 06/20/2018 0541   PROT 7.4 06/17/2018 1554   ALBUMIN 3.9 06/17/2018 1554   AST 22 08/08/2018   ALT 26 08/08/2018   ALKPHOS 91 08/08/2018   BILITOT 0.8 06/17/2018 1554   GFRNONAA >60 06/20/2018 0541   GFRAA >60 06/20/2018 0541   Recent Labs    06/18/18 0528 06/19/18 0524 06/20/18 0541 08/08/18 08/15/18  NA 140 141 140 142 143  K 3.1* 3.6 3.7 4.0 4.0  CL 103 102 101  --   --   CO2 30 30 29   --   --   GLUCOSE 114* 123* 109*  --   --   BUN 21 20 19 20  24*  CREATININE 0.74  0.80 0.79 0.7 0.7  CALCIUM 9.9 10.0 10.2  --   --    Recent Labs    06/17/18 1554 08/08/18  AST 34 22  ALT 26 26  ALKPHOS 71 91  BILITOT 0.8  --   PROT 7.4  --   ALBUMIN 3.9  --    Recent Labs    06/17/18 1554 06/18/18 0528 06/19/18 0524 06/20/18 0541 08/08/18  WBC 13.7* 10.0 7.3 7.3 8.5  8.5  NEUTROABS 10.7*  --  3.8 3.5  --   HGB 12.9 11.2* 11.3* 12.2 11.4*  11.4*  HCT 39.9 34.9* 35.3* 38.0 34*  34*  MCV 93.0 92.1 92.4 92.9  --   PLT 315 314 330 373 354  354   Recent Labs    08/08/18  CHOL 167  LDLCALC 71  TRIG 277*   No results found for: [facility] Lab Results  Component Value Date   TSH 3.09 08/08/2018   Lab Results  Component Value Date   HGBA1C 6.2 (A) 08/08/2018   Lab Results  Component Value Date   CHOL 167 08/08/2018   HDL 41 08/08/2018   LDLCALC 71 08/08/2018   TRIG 277 (A) 08/08/2018   CHOLHDL 5.3 05/26/2014    Significant Diagnostic Results in last 30 days:  No results found.  Assessment and Plan  Essential hypertension Controlled; on Cozaar 25 mg nightly and diltiazem 60 mg every 6; continue current regimen  H/O: CVA (cerebrovascular accident) Patient with very little residual from prior spoke; continue Plavix 75 mg daily  Depression Patient continues with dealing with the year anniversary of her husband's death and other family losses she is fairly well-controlled; Cymbalta has been increased to 60 mg daily and Lexapro continues at 5 mg daily    Jasmine Leinweber D. [name], MD

## 2018-10-26 ENCOUNTER — Encounter: Payer: Self-pay | Admitting: Internal Medicine

## 2018-10-26 NOTE — Assessment & Plan Note (Signed)
Patient with very little residual from prior spoke; continue Plavix 75 mg daily

## 2018-10-26 NOTE — Assessment & Plan Note (Signed)
Controlled; on Cozaar 25 mg nightly and diltiazem 60 mg every 6; continue current regimen

## 2018-10-26 NOTE — Assessment & Plan Note (Signed)
Patient continues with dealing with the year anniversary of her husband's death and other family losses she is fairly well-controlled; Cymbalta has been increased to 60 mg daily and Lexapro continues at 5 mg daily

## 2018-11-04 ENCOUNTER — Non-Acute Institutional Stay (SKILLED_NURSING_FACILITY): Payer: Medicare Other | Admitting: Internal Medicine

## 2018-11-04 DIAGNOSIS — N760 Acute vaginitis: Secondary | ICD-10-CM

## 2018-11-04 DIAGNOSIS — B9689 Other specified bacterial agents as the cause of diseases classified elsewhere: Secondary | ICD-10-CM

## 2018-11-05 ENCOUNTER — Encounter: Payer: Self-pay | Admitting: Internal Medicine

## 2018-11-05 NOTE — Progress Notes (Signed)
Location:      Place of Service:       Jacklyn Shell, FNP  Patient Care Team: Jacklyn Shell, FNP as PCP - General (Nurse Practitioner) Marylynn Pearson, MD as Referring Physician (Internal Medicine)  Extended Emergency Contact Information Primary Emergency Contact: Macky [place] of [place] Phone: [phone] Mobile Phone: [phone] Relation: Son Secondary Emergency Contact: [place] Mobile Phone: [phone] Relation: Daughter    Allergies: Comtan [entacapone]; Mirapex [pramipexole dihydrochloride]; Sulfa antibiotics; and Ciprofloxacin  Chief Complaint  Patient presents with  . Acute Visit    HPI: Patient is 85 y.o. female who is being seen for complaints of vaginal discharge.  Patient admits discharge with slight but there is odor which smells fishy.  There is no redness or itching.  No pain, no fever chills nausea vomiting or other systemic symptom  Past Medical History:  Diagnosis Date  . Abnormality of gait 02/05/2015  . Anxiety   . Arthritis   . Asthma   . Cerebrovascular disease    CVA '03  . Depression   . Dyslipidemia   . Endometriosis   . GERD (gastroesophageal reflux disease)   . Glaucoma   . Headache(784.0)   . Hypertension   . Macular degeneration    bilateral  . Obesity   . Paralysis agitans ([place]) 08/31/2013  . Parkinson disease ([place])   . TIA (transient ischemic attack)    Multiple    Past Surgical History:  Procedure Laterality Date  . ABDOMINAL HYSTERECTOMY    . CATARACT EXTRACTION, BILATERAL    . GALLBLADDER SURGERY    . LOOP RECORDER IMPLANT  05-28-14   MDT LINQ implanted by Dr Caryl Comes for cryptogenic stroke/palpitations  . LOOP RECORDER IMPLANT N/A 05/28/2014   Procedure: LOOP RECORDER IMPLANT;  Surgeon: Deboraha Sprang, MD;  Location: [facility] CATH LAB;  Service: Cardiovascular;  Laterality: N/A;  . PILONIDAL CYST RESECTION    . TILT TABLE STUDY  05-28-14   orthostatic hypertension  . TILT TABLE STUDY N/A  05/28/2014   Procedure: TILT TABLE STUDY;  Surgeon: Deboraha Sprang, MD;  Location: [facility] CATH LAB;  Service: Cardiovascular;  Laterality: N/A;  . TONSILLECTOMY      Allergies as of 11/04/2018      Reactions   Comtan [entacapone] Other (See Comments)   Reaction:  Unknown    Mirapex [pramipexole Dihydrochloride] Other (See Comments)   Reaction:  Unknown    Sulfa Antibiotics Nausea And Vomiting   Ciprofloxacin Other (See Comments)   Reaction:  Made pt feel crazy       Medication List       Accurate as of November 04, 2018 11:59 PM. Always use your most recent med list.        Acetaminophen 500 MG coapsule Take 500 mg by mouth 3 (three) times daily.   ALPRAZolam 0.5 MG tablet Commonly known as:  XANAX Take 1 tablet (0.5 mg total) by mouth 3 (three) times daily.   atorvastatin 10 MG tablet Commonly known as:  LIPITOR Take 10 mg by mouth at bedtime.   BIOFREEZE 4 % Gel Generic drug:  Menthol (Topical Analgesic) Apply 1 application topically 3 (three) times daily.   busPIRone 5 MG tablet Commonly known as:  BUSPAR Take 5 mg by mouth 2 (two) times daily.   clopidogrel 75 MG tablet Commonly known as:  PLAVIX Take 75 mg by mouth daily at 12 noon.   DULoxetine 60 MG capsule Commonly known as:  CYMBALTA Take 60 mg by  mouth daily.   escitalopram 5 MG tablet Commonly known as:  LEXAPRO Take 5 mg by mouth daily.   glucose blood test strip 1 each by Other route as needed for other ([place]). Use as instructed   losartan 25 MG tablet Commonly known as:  COZAAR Take 25 mg by mouth at bedtime.   Melatonin 3 MG Caps Take 3 mg by mouth at bedtime.   metFORMIN 500 MG tablet Commonly known as:  GLUCOPHAGE Take 500 mg by mouth 2 (two) times daily before a meal.   montelukast 10 MG tablet Commonly known as:  SINGULAIR Take 10 mg by mouth daily at 12 noon.   oxymetazoline 0.05 % nasal spray Commonly known as:  AFRIN Place 1 spray into  both nostrils as needed (for nose bleeds).   TAB-A-VITE Tabs Take 1 tablet by mouth daily at 12 noon.   Travoprost (BAK Free) 0.004 % Soln ophthalmic solution Commonly known as:  TRAVATAN Place 1 drop into both eyes at bedtime.   VENTOLIN HFA 108 (90 Base) MCG/ACT inhaler Generic drug:  albuterol Inhale 2 puffs into the lungs every 6 (six) hours as needed for wheezing or shortness of breath.       No orders of the defined types were placed in this encounter.    There is no immunization history on file for this patient.  Social History   Tobacco Use  . Smoking status: Never Smoker  . Smokeless tobacco: Never Used  Substance Use Topics  . Alcohol use: No    Review of Systems  DATA OBTAINED: from patient, nurse GENERAL:  no fevers, fatigue, appetite changes SKIN: No itching, rash HEENT: No complaint RESPIRATORY: No cough, wheezing, SOB CARDIAC: No chest pain, palpitations, lower extremity edema  GI: No abdominal pain, No N/V/D or constipation, No heartburn or reflux  GU: No dysuria, frequency or urgency, or incontinence; vaginal discharge with fishy smell MUSCULOSKELETAL: No unrelieved bone/joint pain NEUROLOGIC: No headache, dizziness  PSYCHIATRIC: No overt anxiety or sadness  Vitals:   11/05/18 1648  BP: 140/70  Pulse: 90  Resp: 18  Temp: 98.8 F (37.1 C)   Body mass index is 35.8 kg/m. Physical Exam  GENERAL APPEARANCE: Alert, conversant, No acute distress  SKIN: No diaphoresis rash HEENT: Unremarkable RESPIRATORY: Breathing is even, unlabored. Lung sounds are clear   CARDIOVASCULAR: Heart RRR no murmurs, rubs or gallops. No peripheral edema  GASTROINTESTINAL: Abdomen is soft, non-tender, not distended w/ normal bowel sounds.  GENITOURINARY: Bladder non tender, not distended  MUSCULOSKELETAL: No abnormal joints or musculature NEUROLOGIC: Cranial nerves 2-12 grossly intact. Moves all extremities PSYCHIATRIC: Mood and affect appropriate to situation, no  behavioral issues  Patient Active Problem List   Diagnosis Date Noted  . GERD (gastroesophageal reflux disease) 09/05/2018  . Viral pneumonia 07/03/2018  . Acute respiratory failure with hypoxia ([place]) 07/03/2018  . Hypokalemia 07/03/2018  . Diabetes mellitus type II, controlled ([place]) 07/03/2018  . Depression 07/03/2018  . Essential hypertension   . Sepsis ([place])   . Altered mental status   . Metabolic encephalopathy 48/54/6270  . Aspiration pneumonitis ([place]) 06/17/2018  . Tachycardia 08/12/2015  . Abnormality of gait 02/05/2015  . Palpitations 05/28/2014  . TIA (transient ischemic attack) 05/25/2014  . Hyperlipidemia associated with type 2 diabetes mellitus ([place]) 05/25/2014  . Parkinson disease ([place]) 05/25/2014  . H/O: CVA (cerebrovascular accident) 05/25/2014  . Paralysis agitans ([place]) 08/31/2013  . Dizziness and giddiness 08/31/2013    CMP  Component Value Date/Time   NA 143 08/15/2018   K 4.0 08/15/2018   CL 101 06/20/2018 0541   CO2 29 06/20/2018 0541   GLUCOSE 109 (H) 06/20/2018 0541   BUN 24 (A) 08/15/2018   CREATININE 0.7 08/15/2018   CREATININE 0.79 06/20/2018 0541   CALCIUM 10.2 06/20/2018 0541   PROT 7.4 06/17/2018 1554   ALBUMIN 3.9 06/17/2018 1554   AST 22 08/08/2018   ALT 26 08/08/2018   ALKPHOS 91 08/08/2018   BILITOT 0.8 06/17/2018 1554   GFRNONAA >60 06/20/2018 0541   GFRAA >60 06/20/2018 0541   Recent Labs    06/18/18 0528 06/19/18 0524 06/20/18 0541 08/08/18 08/15/18  NA 140 141 140 142 143  K 3.1* 3.6 3.7 4.0 4.0  CL 103 102 101  --   --   CO2 30 30 29   --   --   GLUCOSE 114* 123* 109*  --   --   BUN 21 20 19 20  24*  CREATININE 0.74 0.80 0.79 0.7 0.7  CALCIUM 9.9 10.0 10.2  --   --    Recent Labs    06/17/18 1554 08/08/18  AST 34 22  ALT 26 26  ALKPHOS 71 91  BILITOT 0.8  --   PROT 7.4  --   ALBUMIN 3.9  --    Recent Labs    06/17/18 1554 06/18/18 0528 06/19/18 0524 06/20/18 0541 08/08/18  WBC 13.7* 10.0 7.3 7.3 8.5   8.5  NEUTROABS 10.7*  --  3.8 3.5  --   HGB 12.9 11.2* 11.3* 12.2 11.4*  11.4*  HCT 39.9 34.9* 35.3* 38.0 34*  34*  MCV 93.0 92.1 92.4 92.9  --   PLT 315 314 330 373 354  354   Recent Labs    08/08/18  CHOL 167  LDLCALC 71  TRIG 277*   No results found for: [facility] Lab Results  Component Value Date   TSH 3.09 08/08/2018   Lab Results  Component Value Date   HGBA1C 6.2 (A) 08/08/2018   Lab Results  Component Value Date   CHOL 167 08/08/2018   HDL 41 08/08/2018   LDLCALC 71 08/08/2018   TRIG 277 (A) 08/08/2018   CHOLHDL 5.3 05/26/2014    Significant Diagnostic Results in last 30 days:  No results found.  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

## 2018-11-08 ENCOUNTER — Other Ambulatory Visit: Payer: Self-pay

## 2018-11-08 MED ORDER — ALPRAZOLAM 0.5 MG PO TABS: 0.5000 mg | ORAL_TABLET | Freq: Three times a day (TID) | ORAL | 2 refills | Status: DC

## 2018-11-23 ENCOUNTER — Ambulatory Visit: Payer: Medicare Other | Admitting: Neurology

## 2018-11-23 ENCOUNTER — Encounter: Payer: Self-pay | Admitting: Neurology

## 2018-11-23 VITALS — BP 122/64 | HR 87

## 2018-11-23 DIAGNOSIS — G20 Parkinson's disease: Secondary | ICD-10-CM

## 2018-11-23 DIAGNOSIS — I679 Cerebrovascular disease, unspecified: Secondary | ICD-10-CM | POA: Diagnosis not present

## 2018-11-23 DIAGNOSIS — R269 Unspecified abnormalities of gait and mobility: Secondary | ICD-10-CM | POA: Diagnosis not present

## 2018-11-23 DIAGNOSIS — R413 Other amnesia: Secondary | ICD-10-CM

## 2018-11-23 HISTORY — DX: Other amnesia: R41.3

## 2018-11-23 MED ORDER — QUETIAPINE FUMARATE 25 MG PO TABS: 25.0000 mg | ORAL_TABLET | Freq: Every day | ORAL | 3 refills | Status: DC

## 2018-11-23 NOTE — Progress Notes (Addendum)
Reason for visit: Parkinson's disease  [name] is an 85 y.o. female  History of present illness:  [name] is an 85 year old right-handed white female with a history of Parkinson's disease associated with a severe gait disorder.  The patient has evidence of extensive white matter changes on CT scan of the brain that likely are a contributing factor to her memory issues, gait disturbance problems and her urinary incontinence.  The patient is at [facility].  The patient continues to fall out of bed when she tries to get up on her own to get to the bathroom.  The patient is only able to mobilize by using a wheelchair.  She has little use of the right arm secondary to several fractures.  The patient was admitted to the hospital on 17 June 2018 with an aspiration pneumonia.  The patient does have trouble with swallowing, she has to do a chin tuck in order to swallow liquids.  She is having problems with vivid dreams at night, she will have hallucinations off and on.  She will call family members in the middle of the night with confusion.  The patient returns to the office today for an evaluation.  She remains on Sinemet in relatively low-dose, she takes the 25/100 mg tablet 3 times daily.  Past Medical History:  Diagnosis Date  . Abnormality of gait 02/05/2015  . Anxiety   . Arthritis   . Asthma   . Cerebrovascular disease    CVA '03  . Depression   . Dyslipidemia   . Endometriosis   . GERD (gastroesophageal reflux disease)   . Glaucoma   . Headache(784.0)   . Hypertension   . Macular degeneration    bilateral  . Obesity   . Paralysis agitans ([place]) 08/31/2013  . Parkinson disease ([place])   . TIA (transient ischemic attack)    Multiple    Past Surgical History:  Procedure Laterality Date  . ABDOMINAL HYSTERECTOMY    . CATARACT EXTRACTION, BILATERAL    . GALLBLADDER SURGERY    . LOOP RECORDER IMPLANT  05-28-14   MDT LINQ implanted by Dr Caryl Comes for  cryptogenic stroke/palpitations  . LOOP RECORDER IMPLANT N/A 05/28/2014   Procedure: LOOP RECORDER IMPLANT;  Surgeon: Deboraha Sprang, MD;  Location: [facility] CATH LAB;  Service: Cardiovascular;  Laterality: N/A;  . PILONIDAL CYST RESECTION    . TILT TABLE STUDY  05-28-14   orthostatic hypertension  . TILT TABLE STUDY N/A 05/28/2014   Procedure: TILT TABLE STUDY;  Surgeon: Deboraha Sprang, MD;  Location: [facility] CATH LAB;  Service: Cardiovascular;  Laterality: N/A;  . TONSILLECTOMY      Family History  Problem Relation Age of Onset  . Heart attack Mother   . Arthritis Brother   . Skin cancer Brother     Social history:  reports that she has never smoked. She has never used smokeless tobacco. She reports that she does not drink alcohol or use drugs.    Allergies  Allergen Reactions  . Comtan [Entacapone] Other (See Comments)    Reaction:  Unknown   . Mirapex [Pramipexole Dihydrochloride] Other (See Comments)    Reaction:  Unknown   . Sulfa Antibiotics Nausea And Vomiting  . Ciprofloxacin Other (See Comments)    Reaction:  Made pt feel crazy     Medications:  Prior to Admission medications   Medication Sig Start Date End Date Taking? Authorizing Provider  Acetaminophen 500 MG coapsule Take  500 mg by mouth 3 (three) times daily.    Yes [provider]  albuterol (VENTOLIN HFA) 108 (90 Base) MCG/ACT inhaler Inhale 2 puffs into the lungs every 6 (six) hours as needed for wheezing or shortness of breath.   Yes [provider]  ALPRAZolam (XANAX) 0.5 MG tablet Take 1 tablet (0.5 mg total) by mouth 3 (three) times daily. 11/08/18  Yes Hennie Duos, MD  atorvastatin (LIPITOR) 10 MG tablet Take 10 mg by mouth at bedtime.    Yes [provider]  busPIRone (BUSPAR) 5 MG tablet Take 5 mg by mouth 2 (two) times daily.   Yes [provider]  clopidogrel (PLAVIX) 75 MG tablet Take 75 mg by mouth daily at 12 noon.    Yes [provider]  DULoxetine (CYMBALTA)  60 MG capsule Take 60 mg by mouth daily.   Yes [provider]  escitalopram (LEXAPRO) 5 MG tablet Take 5 mg by mouth daily.   Yes [provider]  glucose blood test strip 1 each by Other route as needed for other ([place]). Use as instructed   Yes [provider]  losartan (COZAAR) 25 MG tablet Take 25 mg by mouth at bedtime.    Yes [provider]  Melatonin 3 MG CAPS Take 3 mg by mouth at bedtime.   Yes [provider]  Menthol, Topical Analgesic, (BIOFREEZE) 4 % GEL Apply 1 application topically 3 (three) times daily.   Yes [provider]  metFORMIN (GLUCOPHAGE) 500 MG tablet Take 500 mg by mouth 2 (two) times daily before a meal.    Yes [provider]  montelukast (SINGULAIR) 10 MG tablet Take 10 mg by mouth daily at 12 noon.    Yes [provider]  Multiple Vitamin (TAB-A-VITE) TABS Take 1 tablet by mouth daily at 12 noon.    Yes [provider]  oxymetazoline (AFRIN) 0.05 % nasal spray Place 1 spray into both nostrils as needed (for nose bleeds).   Yes [provider]  Travoprost, BAK Free, (TRAVATAN) 0.004 % SOLN ophthalmic solution Place 1 drop into both eyes at bedtime.   Yes [provider]  carbidopa-levodopa (SINEMET IR) 25-100 MG tablet Take 1 tablet by mouth every 8 (eight) hours. 06/21/18 10/24/18  Arrien, Jimmy Picket, MD  diltiazem (CARDIZEM) 60 MG tablet Take 1 tablet (60 mg total) by mouth every 6 (six) hours. 06/21/18 10/24/18  Arrien, Jimmy Picket, MD  pantoprazole sodium (PROTONIX) 40 mg/20 mL PACK Take 20 mLs (40 mg total) by mouth daily. 06/22/18 10/24/18  Arrien, Jimmy Picket, MD    ROS:  Out of a complete 14 system review of symptoms, the patient complains only of the following symptoms, and all other reviewed systems are negative.  Decreased appetite Eye itching Frequency of urination, urinary urgency Daytime  sleepiness Joint pain, aching muscles, walking difficulty Weakness, tremors Confusion, depression, hallucinations  Blood pressure 122/64, pulse 87.  Physical Exam  General: The patient is alert and cooperative at the time of the examination.  The patient is moderately to markedly obese.  Skin: No significant peripheral edema is noted.   Neurologic Exam  Mental status: The patient is alert and oriented x 3 at the time of the examination.   Cranial nerves: Facial symmetry is present. Speech is normal, no aphasia or dysarthria is noted. Extraocular movements are full. Visual fields are full.  Motor: The patient has good strength in all 4 extremities, but  the patient is not able to use the right arm fully, she is unable to elevate the arm, she has good grip strength and good flexion extension strength at the elbow on the right.  Sensory examination: Soft touch sensation is symmetric on the face, arms, and legs.  Coordination: The patient has good finger-nose-finger on the left and good heel-to-shin bilaterally.  Gait and station: The patient is able to stand with assistance, she cannot effectively walk.  Reflexes: Deep tendon reflexes are symmetric.   CT head 06/05/18:  IMPRESSION: No evidence of acute intracranial abnormality.  Atrophy with small vessel ischemic changes.  * CT scan images were reviewed online. I agree with the written report.    Assessment/Plan:  1.  Parkinson's disease  2.  Extensive white matter disease, cerebrovascular disease  3.  Gait disorder  4.  Memory disorder  5.  Urinary incontinence  The patient is having hallucinations, she has vivid dreams at night and may have a REM sleep disorder.  She is currently on alprazolam taking 0.5 mg 3 times daily.  Seroquel will be added in the evening hours taking 25 mg to help reduce the hallucinations.  The patient will be maintained on her current dose of Sinemet.  She will follow-up in 6 months.  Jill Alexanders MD 11/23/2018 10:05 AM  [facility] [address] [place], [place] [address]  Phone [phone] Fax [phone]

## 2018-11-23 NOTE — Patient Instructions (Signed)
We will start seroquel 25 mg at night.

## 2018-11-24 ENCOUNTER — Non-Acute Institutional Stay (SKILLED_NURSING_FACILITY): Payer: Medicare Other | Admitting: Internal Medicine

## 2018-11-24 DIAGNOSIS — R400 Somnolence: Secondary | ICD-10-CM | POA: Diagnosis not present

## 2018-11-24 DIAGNOSIS — T887XXA Unspecified adverse effect of drug or medicament, initial encounter: Secondary | ICD-10-CM | POA: Diagnosis not present

## 2018-11-24 NOTE — Progress Notes (Signed)
Location:  [facility] Room Number: 086V Place of Service:  SNF (31)  [name]. [name], MD  Patient Care Team: Jacklyn Shell, FNP as PCP - General (Nurse Practitioner) Marylynn Pearson, MD as Referring Physician (Internal Medicine)  Extended Emergency Contact Information Primary Emergency Contact: Macky [place] Phone: [phone] Mobile Phone: [phone] Relation: Son Secondary Emergency Contact: [place] Mobile Phone: [phone] Relation: Daughter    Allergies: Comtan [entacapone]; Mirapex [pramipexole dihydrochloride]; Sulfa antibiotics; Ciprofloxacin; and Seroquel [quetiapine fumarate]  Chief Complaint  Patient presents with  . Acute Visit    HPI: Patient is 85 y.o. female who nursing asked me to see because she is very sleepy today.  When I wake her up she recognizes me and speaks to me, makes sense then goes back to sleep.  Nurses did report to me that this afternoon she got up in her room went to the bathroom and came back.  Patient has not had a cough or cold, any urinary symptoms no fever chills nausea vomiting diarrhea rash or other systemic symptom.  Of note the patient did go to neurology day before yesterday and had been prescribed Seroquel for hallucinations.  Past Medical History:  Diagnosis Date  . Abnormality of gait 02/05/2015  . Anxiety   . Arthritis   . Asthma   . Cerebrovascular disease    CVA '03  . Depression   . Dyslipidemia   . Endometriosis   . GERD (gastroesophageal reflux disease)   . Glaucoma   . Headache(784.0)   . Hypertension   . Macular degeneration    bilateral  . Memory disorder 11/23/2018  . Obesity   . Paralysis agitans ([place]) 08/31/2013  . Parkinson disease ([place])   . TIA (transient ischemic attack)    Multiple    Past Surgical History:  Procedure Laterality Date  . ABDOMINAL HYSTERECTOMY    . CATARACT EXTRACTION, BILATERAL    . GALLBLADDER SURGERY     . LOOP RECORDER IMPLANT  05-28-14   MDT LINQ implanted by Dr Caryl Comes for cryptogenic stroke/palpitations  . LOOP RECORDER IMPLANT N/A 05/28/2014   Procedure: LOOP RECORDER IMPLANT;  Surgeon: Deboraha Sprang, MD;  Location: [facility] CATH LAB;  Service: Cardiovascular;  Laterality: N/A;  . PILONIDAL CYST RESECTION    . TILT TABLE STUDY  05-28-14   orthostatic hypertension  . TILT TABLE STUDY N/A 05/28/2014   Procedure: TILT TABLE STUDY;  Surgeon: Deboraha Sprang, MD;  Location: [facility] CATH LAB;  Service: Cardiovascular;  Laterality: N/A;  . TONSILLECTOMY      Allergies as of 11/24/2018      Reactions   Comtan [entacapone] Other (See Comments)   Reaction:  Unknown    Mirapex [pramipexole Dihydrochloride] Other (See Comments)   Reaction:  Unknown    Sulfa Antibiotics Nausea And Vomiting   Ciprofloxacin Other (See Comments)   Reaction:  Made pt feel crazy    Seroquel [quetiapine Fumarate]    Excessive sleepyness      Medication List       Accurate as of November 24, 2018 11:59 PM. Always use your most recent med list.        Acetaminophen 500 MG coapsule Take 500 mg by mouth 3 (three) times daily.   ALPRAZolam 0.5 MG tablet Commonly known as:  XANAX Take 1 tablet (0.5 mg total) by mouth 3 (three) times daily.   atorvastatin 10 MG tablet Commonly known as:  LIPITOR Take 10 mg by  mouth at bedtime.   BIOFREEZE 4 % Gel Generic drug:  Menthol (Topical Analgesic) Apply 1 application topically 3 (three) times daily.   busPIRone 5 MG tablet Commonly known as:  BUSPAR Take 5 mg by mouth 2 (two) times daily.   carbidopa-levodopa 25-100 MG tablet Commonly known as:  SINEMET IR Take 1 tablet by mouth every 8 (eight) hours.   clopidogrel 75 MG tablet Commonly known as:  PLAVIX Take 75 mg by mouth daily at 12 noon.   diltiazem 60 MG tablet Commonly known as:  CARDIZEM Take 1 tablet (60 mg total) by mouth every 6 (six) hours.   DULoxetine 60 MG capsule Commonly known as:  CYMBALTA Take 60  mg by mouth daily.   escitalopram 5 MG tablet Commonly known as:  LEXAPRO Take 5 mg by mouth daily.   glucose blood test strip 1 each by Other route as needed for other ([place]). Use as instructed   losartan 25 MG tablet Commonly known as:  COZAAR Take 25 mg by mouth at bedtime.   Melatonin 3 MG Caps Take 3 mg by mouth at bedtime.   metFORMIN 500 MG tablet Commonly known as:  GLUCOPHAGE Take 500 mg by mouth 2 (two) times daily before a meal.   montelukast 10 MG tablet Commonly known as:  SINGULAIR Take 10 mg by mouth daily at 12 noon.   oxymetazoline 0.05 % nasal spray Commonly known as:  AFRIN Place 1 spray into both nostrils as needed (for nose bleeds).   pantoprazole sodium 40 mg/20 mL Pack Commonly known as:  PROTONIX Take 20 mLs (40 mg total) by mouth daily.   TAB-A-VITE Tabs Take 1 tablet by mouth daily at 12 noon.   Travoprost (BAK Free) 0.004 % Soln ophthalmic solution Commonly known as:  TRAVATAN Place 1 drop into both eyes at bedtime.   VENTOLIN HFA 108 (90 Base) MCG/ACT inhaler Generic drug:  albuterol Inhale 2 puffs into the lungs every 6 (six) hours as needed for wheezing or shortness of breath.       No orders of the defined types were placed in this encounter.    There is no immunization history on file for this patient.  Social History   Tobacco Use  . Smoking status: Never Smoker  . Smokeless tobacco: Never Used  Substance Use Topics  . Alcohol use: No    Review of Systems  DATA OBTAINED: from patient, nurse GENERAL:  no fevers, fatigue, appetite changes SKIN: No itching, rash HEENT: No complaint RESPIRATORY: No cough, wheezing, SOB CARDIAC: No chest pain, palpitations, lower extremity edema  GI: No abdominal pain, No N/V/D or constipation, No heartburn or reflux  GU: No dysuria, frequency or urgency, or incontinence  MUSCULOSKELETAL: No unrelieved bone/joint pain NEUROLOGIC: No  headache, dizziness; sleepiness today PSYCHIATRIC: No overt anxiety or sadness  Vitals:   11/25/18 1321  BP: (!) 141/76  Pulse: 89  Resp: 19  Temp: 98 F (36.7 C)  SpO2: 93%   Body mass index is 36.56 kg/m. Physical Exam  GENERAL APPEARANCE: Thin, awakens to voice conversant, No acute distress  SKIN: No diaphoresis rash HEENT: Unremarkable RESPIRATORY: Breathing is even, unlabored. Lung sounds are clear   CARDIOVASCULAR: Heart RRR no murmurs, rubs or gallops. No peripheral edema  GASTROINTESTINAL: Abdomen is soft, non-tender, not distended w/ normal bowel sounds.  GENITOURINARY: Bladder non tender, not distended  MUSCULOSKELETAL: No abnormal joints or musculature NEUROLOGIC: Cranial nerves 2-12 grossly intact. Moves all extremities  PSYCHIATRIC: Somnolent, but when awakened answers questions appropriately  Patient Active Problem List   Diagnosis Date Noted  . Memory disorder 11/23/2018  . GERD (gastroesophageal reflux disease) 09/05/2018  . Viral pneumonia 07/03/2018  . Acute respiratory failure with hypoxia ([place]) 07/03/2018  . Hypokalemia 07/03/2018  . Diabetes mellitus type II, controlled ([place]) 07/03/2018  . Depression 07/03/2018  . Essential hypertension   . Sepsis ([place])   . Altered mental status   . Metabolic encephalopathy 63/11/6008  . Aspiration pneumonitis ([place]) 06/17/2018  . Tachycardia 08/12/2015  . Abnormality of gait 02/05/2015  . Palpitations 05/28/2014  . Cerebrovascular disease 05/25/2014  . Hyperlipidemia associated with type 2 diabetes mellitus ([place]) 05/25/2014  . Parkinson disease ([place]) 05/25/2014  . H/O: CVA (cerebrovascular accident) 05/25/2014  . Paralysis agitans ([place]) 08/31/2013  . Dizziness and giddiness 08/31/2013    CMP     Component Value Date/Time   NA 143 08/15/2018   K 4.0 08/15/2018   CL 101 06/20/2018 0541   CO2 29 06/20/2018 0541   GLUCOSE 109 (H) 06/20/2018 0541   BUN 24 (A) 08/15/2018   CREATININE 0.7 08/15/2018    CREATININE 0.79 06/20/2018 0541   CALCIUM 10.2 06/20/2018 0541   PROT 7.4 06/17/2018 1554   ALBUMIN 3.9 06/17/2018 1554   AST 22 08/08/2018   ALT 26 08/08/2018   ALKPHOS 91 08/08/2018   BILITOT 0.8 06/17/2018 1554   GFRNONAA >60 06/20/2018 0541   GFRAA >60 06/20/2018 0541   Recent Labs    06/18/18 0528 06/19/18 0524 06/20/18 0541 08/08/18 08/15/18  NA 140 141 140 142 143  K 3.1* 3.6 3.7 4.0 4.0  CL 103 102 101  --   --   CO2 30 30 29   --   --   GLUCOSE 114* 123* 109*  --   --   BUN 21 20 19 20  24*  CREATININE 0.74 0.80 0.79 0.7 0.7  CALCIUM 9.9 10.0 10.2  --   --    Recent Labs    06/17/18 1554 08/08/18  AST 34 22  ALT 26 26  ALKPHOS 71 91  BILITOT 0.8  --   PROT 7.4  --   ALBUMIN 3.9  --    Recent Labs    06/17/18 1554 06/18/18 0528 06/19/18 0524 06/20/18 0541 08/08/18  WBC 13.7* 10.0 7.3 7.3 8.5  8.5  NEUTROABS 10.7*  --  3.8 3.5  --   HGB 12.9 11.2* 11.3* 12.2 11.4*  11.4*  HCT 39.9 34.9* 35.3* 38.0 34*  34*  MCV 93.0 92.1 92.4 92.9  --   PLT 315 314 330 373 354  354   Recent Labs    08/08/18  CHOL 167  LDLCALC 71  TRIG 277*   No results found for: [facility] Lab Results  Component Value Date   TSH 3.09 08/08/2018   Lab Results  Component Value Date   HGBA1C 6.2 (A) 08/08/2018   Lab Results  Component Value Date   CHOL 167 08/08/2018   HDL 41 08/08/2018   LDLCALC 71 08/08/2018   TRIG 277 (A) 08/08/2018   CHOLHDL 5.3 05/26/2014    Significant Diagnostic Results in last 30 days:  No results found.  Assessment and Plan  Somnolence/side effect of Seroquel- patient was prescribed Seroquel for hallucinations and is now extremely sleepy; she had a similar experience when I had prescribed Seroquel for her; patient has an obvious intolerance to Seroquel will DC Seroquel and monitor response    Inocencio Homes, MD

## 2018-11-25 ENCOUNTER — Encounter: Payer: Self-pay | Admitting: Internal Medicine

## 2018-11-26 ENCOUNTER — Encounter: Payer: Self-pay | Admitting: Internal Medicine

## 2018-11-30 ENCOUNTER — Encounter: Payer: Self-pay | Admitting: Internal Medicine

## 2018-11-30 ENCOUNTER — Non-Acute Institutional Stay (SKILLED_NURSING_FACILITY): Payer: Medicare Other | Admitting: Internal Medicine

## 2018-11-30 DIAGNOSIS — K219 Gastro-esophageal reflux disease without esophagitis: Secondary | ICD-10-CM | POA: Diagnosis not present

## 2018-11-30 DIAGNOSIS — F5104 Psychophysiologic insomnia: Secondary | ICD-10-CM

## 2018-11-30 DIAGNOSIS — G20 Parkinson's disease: Secondary | ICD-10-CM | POA: Diagnosis not present

## 2018-11-30 NOTE — Progress Notes (Signed)
Location:  [place] Room Number: 097D Place of Service:  SNF (31)  [name], MD  Patient Care Team: Jacklyn Shell, FNP as PCP - General (Nurse Practitioner) Marylynn Pearson, MD as Referring Physician (Internal Medicine)  Extended Emergency Contact Information Primary Emergency Contact: Macky [place] Phone: [phone] Mobile Phone: [phone] Relation: Son Secondary Emergency Contact: [place] Mobile Phone: [phone] Relation: Daughter    Allergies: Comtan [entacapone]; Mirapex [pramipexole dihydrochloride]; Sulfa antibiotics; Ciprofloxacin; and Seroquel [quetiapine fumarate]  Chief Complaint  Patient presents with  . Medical Management of Chronic Issues    Routine Visit    HPI: Patient is 85 y.o. female who is being seen for routine issues of GERD, Parkinson's, and insomnia.  Past Medical History:  Diagnosis Date  . Abnormality of gait 02/05/2015  . Anxiety   . Arthritis   . Asthma   . Cerebrovascular disease    CVA '03  . Depression   . Dyslipidemia   . Endometriosis   . GERD (gastroesophageal reflux disease)   . Glaucoma   . Headache(784.0)   . Hypertension   . Macular degeneration    bilateral  . Memory disorder 11/23/2018  . Obesity   . Paralysis agitans ([place]) 08/31/2013  . Parkinson disease ([place])   . TIA (transient ischemic attack)    Multiple    Past Surgical History:  Procedure Laterality Date  . ABDOMINAL HYSTERECTOMY    . CATARACT EXTRACTION, BILATERAL    . GALLBLADDER SURGERY    . LOOP RECORDER IMPLANT  05-28-14   MDT LINQ implanted by Dr Caryl Comes for cryptogenic stroke/palpitations  . LOOP RECORDER IMPLANT N/A 05/28/2014   Procedure: LOOP RECORDER IMPLANT;  Surgeon: Deboraha Sprang, MD;  Location: [facility] CATH LAB;  Service: Cardiovascular;  Laterality: N/A;  . PILONIDAL CYST RESECTION    . TILT TABLE STUDY  05-28-14   orthostatic hypertension  . TILT TABLE  STUDY N/A 05/28/2014   Procedure: TILT TABLE STUDY;  Surgeon: Deboraha Sprang, MD;  Location: [facility] CATH LAB;  Service: Cardiovascular;  Laterality: N/A;  . TONSILLECTOMY      Allergies as of 11/30/2018      Reactions   Comtan [entacapone] Other (See Comments)   Reaction:  Unknown    Mirapex [pramipexole Dihydrochloride] Other (See Comments)   Reaction:  Unknown    Sulfa Antibiotics Nausea And Vomiting   Ciprofloxacin Other (See Comments)   Reaction:  Made pt feel crazy    Seroquel [quetiapine Fumarate]    Excessive sleepyness      Medication List       Accurate as of November 30, 2018 11:59 PM. Always use your most recent med list.        Acetaminophen 500 MG coapsule Take 500 mg by mouth 3 (three) times daily.   ALPRAZolam 0.5 MG tablet Commonly known as:  XANAX Take 1 tablet (0.5 mg total) by mouth 3 (three) times daily.   atorvastatin 10 MG tablet Commonly known as:  LIPITOR Take 10 mg by mouth at bedtime.   BIOFREEZE 4 % Gel Generic drug:  Menthol (Topical Analgesic) Apply 1 application topically 3 (three) times daily.   busPIRone 5 MG tablet Commonly known as:  BUSPAR Take 5 mg by mouth 2 (two) times daily.   carbidopa-levodopa 25-100 MG tablet Commonly known as:  SINEMET IR Take 1 tablet by mouth every 8 (eight) hours.   clopidogrel 75 MG tablet Commonly known as:  PLAVIX  Take 75 mg by mouth daily at 12 noon.   diltiazem 60 MG tablet Commonly known as:  CARDIZEM Take 1 tablet (60 mg total) by mouth every 6 (six) hours.   DULoxetine 60 MG capsule Commonly known as:  CYMBALTA Take 60 mg by mouth daily.   escitalopram 5 MG tablet Commonly known as:  LEXAPRO Take 5 mg by mouth daily.   glucose blood test strip 1 each by Other route as needed for other ([place]). Use as instructed   losartan 25 MG tablet Commonly known as:  COZAAR Take 25 mg by mouth at bedtime.   Melatonin 3 MG Caps Take 3 mg by mouth at  bedtime.   metFORMIN 500 MG tablet Commonly known as:  GLUCOPHAGE Take 500 mg by mouth 2 (two) times daily before a meal.   montelukast 10 MG tablet Commonly known as:  SINGULAIR Take 10 mg by mouth daily at 12 noon.   oxymetazoline 0.05 % nasal spray Commonly known as:  AFRIN Place 1 spray into both nostrils as needed (for nose bleeds).   pantoprazole sodium 40 mg/20 mL Pack Commonly known as:  PROTONIX Take 20 mLs (40 mg total) by mouth daily.   TAB-A-VITE Tabs Take 1 tablet by mouth daily at 12 noon.   Travoprost (BAK Free) 0.004 % Soln ophthalmic solution Commonly known as:  TRAVATAN Place 1 drop into both eyes at bedtime.   VENTOLIN HFA 108 (90 Base) MCG/ACT inhaler Generic drug:  albuterol Inhale 2 puffs into the lungs every 6 (six) hours as needed for wheezing or shortness of breath.       No orders of the defined types were placed in this encounter.    There is no immunization history on file for this patient.  Social History   Tobacco Use  . Smoking status: Never Smoker  . Smokeless tobacco: Never Used  Substance Use Topics  . Alcohol use: No    Review of Systems  DATA OBTAINED: from patient, nurse GENERAL:  no fevers, fatigue, appetite changes SKIN: No itching, rash HEENT: No complaint RESPIRATORY: No cough, wheezing, SOB CARDIAC: No chest pain, palpitations, lower extremity edema  GI: No abdominal pain, No N/V/D or constipation, No heartburn or reflux  GU: No dysuria, frequency or urgency, or incontinence  MUSCULOSKELETAL: No unrelieved bone/joint pain NEUROLOGIC: No headache, dizziness; several falls recently PSYCHIATRIC: No overt anxiety or sadness  Vitals:   11/30/18 1032  BP: (!) 141/76  Pulse: 89  Resp: 19  Temp: 98 F (36.7 C)   Body mass index is 36.56 kg/m. Physical Exam  GENERAL APPEARANCE: Alert, conversant, No acute distress  SKIN: No diaphoresis rash HEENT: Unremarkable RESPIRATORY: Breathing is even, unlabored. Lung  sounds are clear   CARDIOVASCULAR: Heart RRR no murmurs, rubs or gallops. No peripheral edema  GASTROINTESTINAL: Abdomen is soft, non-tender, not distended w/ normal bowel sounds.  GENITOURINARY: Bladder non tender, not distended  MUSCULOSKELETAL: No abnormal joints or musculature NEUROLOGIC: Cranial nerves 2-12 grossly intact. Moves all extremities PSYCHIATRIC: Mood and affect appropriate to situation, no behavioral issues  Patient Active Problem List   Diagnosis Date Noted  . Insomnia 12/02/2018  . Memory disorder 11/23/2018  . GERD (gastroesophageal reflux disease) 09/05/2018  . Viral pneumonia 07/03/2018  . Acute respiratory failure with hypoxia ([place]) 07/03/2018  . Hypokalemia 07/03/2018  . Diabetes mellitus type II, controlled ([place]) 07/03/2018  . Depression 07/03/2018  . Essential hypertension   . Sepsis ([place])   . Altered  mental status   . Metabolic encephalopathy 16/08/9603  . Aspiration pneumonitis ([place]) 06/17/2018  . Tachycardia 08/12/2015  . Abnormality of gait 02/05/2015  . Palpitations 05/28/2014  . Cerebrovascular disease 05/25/2014  . Hyperlipidemia associated with type 2 diabetes mellitus ([place]) 05/25/2014  . Parkinson disease ([place]) 05/25/2014  . H/O: CVA (cerebrovascular accident) 05/25/2014  . Paralysis agitans ([place]) 08/31/2013  . Dizziness and giddiness 08/31/2013    CMP     Component Value Date/Time   NA 143 08/15/2018   K 4.0 08/15/2018   CL 101 06/20/2018 0541   CO2 29 06/20/2018 0541   GLUCOSE 109 (H) 06/20/2018 0541   BUN 24 (A) 08/15/2018   CREATININE 0.7 08/15/2018   CREATININE 0.79 06/20/2018 0541   CALCIUM 10.2 06/20/2018 0541   PROT 7.4 06/17/2018 1554   ALBUMIN 3.9 06/17/2018 1554   AST 22 08/08/2018   ALT 26 08/08/2018   ALKPHOS 91 08/08/2018   BILITOT 0.8 06/17/2018 1554   GFRNONAA >60 06/20/2018 0541   GFRAA >60 06/20/2018 0541   Recent Labs    06/18/18 0528 06/19/18 0524 06/20/18 0541 08/08/18 08/15/18  NA 140 141 140 142 143    K 3.1* 3.6 3.7 4.0 4.0  CL 103 102 101  --   --   CO2 30 30 29   --   --   GLUCOSE 114* 123* 109*  --   --   BUN 21 20 19 20  24*  CREATININE 0.74 0.80 0.79 0.7 0.7  CALCIUM 9.9 10.0 10.2  --   --    Recent Labs    06/17/18 1554 08/08/18  AST 34 22  ALT 26 26  ALKPHOS 71 91  BILITOT 0.8  --   PROT 7.4  --   ALBUMIN 3.9  --    Recent Labs    06/17/18 1554 06/18/18 0528 06/19/18 0524 06/20/18 0541 08/08/18  WBC 13.7* 10.0 7.3 7.3 8.5  8.5  NEUTROABS 10.7*  --  3.8 3.5  --   HGB 12.9 11.2* 11.3* 12.2 11.4*  11.4*  HCT 39.9 34.9* 35.3* 38.0 34*  34*  MCV 93.0 92.1 92.4 92.9  --   PLT 315 314 330 373 354  354   Recent Labs    08/08/18  CHOL 167  LDLCALC 71  TRIG 277*   No results found for: [facility] Lab Results  Component Value Date   TSH 3.09 08/08/2018   Lab Results  Component Value Date   HGBA1C 6.2 (A) 08/08/2018   Lab Results  Component Value Date   CHOL 167 08/08/2018   HDL 41 08/08/2018   LDLCALC 71 08/08/2018   TRIG 277 (A) 08/08/2018   CHOLHDL 5.3 05/26/2014    Significant Diagnostic Results in last 30 days:  No results found.  Assessment and Plan  GERD (gastroesophageal reflux disease) No complaints; continue Protonix 40 mg daily  Parkinson disease Patient is having some falls; I think this is related to her parkinsonism and balance; she was sent to neurology but they treated her for hallucinations and did not look at her Parkinson's so I am going to send her back to neurology to look specifically at Parkinson's: Continue Sinemet 100-25 every 8 hours  Insomnia No reports of problems; continue melatonin 3 mg nightly    Anuel Sitter D. [name], MD

## 2018-12-01 ENCOUNTER — Encounter: Payer: Self-pay | Admitting: Neurology

## 2018-12-01 ENCOUNTER — Telehealth: Payer: Self-pay | Admitting: *Deleted

## 2018-12-01 NOTE — Telephone Encounter (Signed)
Patients daughter, Margarita Grizzle, stopped by the office to request a letter for the [place]. Stating that she can no longer act on her behalf therefore her daughter Margarita Grizzle the [place] will take care of everything.  Please call if questions and when ready.  Needs ASAP

## 2018-12-01 NOTE — Telephone Encounter (Signed)
I will dictate a letter.  I called the daughter, she basically needs a letter that indicates that the patient is not competent to make financial and legal decisions.

## 2018-12-02 ENCOUNTER — Encounter: Payer: Self-pay | Admitting: Internal Medicine

## 2018-12-02 DIAGNOSIS — G4700 Insomnia, unspecified: Secondary | ICD-10-CM | POA: Insufficient documentation

## 2018-12-02 NOTE — Assessment & Plan Note (Signed)
No reports of problems; continue melatonin 3 mg nightly

## 2018-12-02 NOTE — Assessment & Plan Note (Signed)
No complaints; continue Protonix 40 mg daily

## 2018-12-02 NOTE — Telephone Encounter (Signed)
I contacted pt's daughter, Margarita Grizzle and advised via vm letter is ready for pick up. Letter placed up front at [facility].

## 2018-12-02 NOTE — Assessment & Plan Note (Signed)
Patient is having some falls; I think this is related to her parkinsonism and balance; she was sent to neurology but they treated her for hallucinations and did not look at her Parkinson's so I am going to send her back to neurology to look specifically at Parkinson's: Continue Sinemet 100-25 every 8 hours

## 2018-12-05 ENCOUNTER — Non-Acute Institutional Stay (SKILLED_NURSING_FACILITY): Payer: Medicare Other | Admitting: Internal Medicine

## 2018-12-05 ENCOUNTER — Encounter: Payer: Self-pay | Admitting: Internal Medicine

## 2018-12-05 DIAGNOSIS — R05 Cough: Secondary | ICD-10-CM | POA: Diagnosis not present

## 2018-12-05 DIAGNOSIS — J40 Bronchitis, not specified as acute or chronic: Secondary | ICD-10-CM | POA: Diagnosis not present

## 2018-12-05 DIAGNOSIS — R059 Cough, unspecified: Secondary | ICD-10-CM

## 2018-12-05 NOTE — Progress Notes (Signed)
:  Location:  [facility] Room Number: 317P Place of Service:  SNF (31)  Tremain Rucinski D. Lyn Hollingshead, MD  Patient Care Team: Manus Gunning, FNP as PCP - General (Nurse Practitioner) Herschel Senegal, MD as Referring Physician (Internal Medicine)  Extended Emergency Contact Information Primary Emergency Contact: Dyke Maes [place] Home Phone: [phone] Mobile Phone: [phone] Relation: Son Secondary Emergency Contact: Miller,Laurie Mobile Phone: [phone] Relation: Daughter     Allergies: Comtan [entacapone]; Mirapex [pramipexole dihydrochloride]; Sulfa antibiotics; Ciprofloxacin; and Seroquel [quetiapine fumarate]  Chief Complaint  Patient presents with  . Acute Visit    HPI: Patient is 85 y.o. female who nursing asked me to see.  Patient has had a cough for several weeks that is not going away.  Patient has not had any fever chills nausea vomiting diarrhea or other systemic symptom.  Patient does admit that she has got central chest pain with cough.  Patient denies shortness of breath.  Past Medical History:  Diagnosis Date  . Abnormality of gait 02/05/2015  . Anxiety   . Arthritis   . Asthma   . Cerebrovascular disease    CVA '03  . Depression   . Dyslipidemia   . Endometriosis   . GERD (gastroesophageal reflux disease)   . Glaucoma   . Headache(784.0)   . Hypertension   . Macular degeneration    bilateral  . Memory disorder 11/23/2018  . Obesity   . Paralysis agitans (HCC) 08/31/2013  . Parkinson disease (HCC)   . TIA (transient ischemic attack)    Multiple    Past Surgical History:  Procedure Laterality Date  . ABDOMINAL HYSTERECTOMY    . CATARACT EXTRACTION, BILATERAL    . GALLBLADDER SURGERY    . LOOP RECORDER IMPLANT  05-28-14   MDT LINQ implanted by Dr Graciela Husbands for cryptogenic stroke/palpitations  . LOOP RECORDER IMPLANT N/A 05/28/2014   Procedure: LOOP RECORDER IMPLANT;  Surgeon: Duke Salvia,  MD;  Location: [facility] CATH LAB;  Service: Cardiovascular;  Laterality: N/A;  . PILONIDAL CYST RESECTION    . TILT TABLE STUDY  05-28-14   orthostatic hypertension  . TILT TABLE STUDY N/A 05/28/2014   Procedure: TILT TABLE STUDY;  Surgeon: Duke Salvia, MD;  Location: [facility] CATH LAB;  Service: Cardiovascular;  Laterality: N/A;  . TONSILLECTOMY      Allergies as of 12/05/2018      Reactions   Comtan [entacapone] Other (See Comments)   Reaction:  Unknown    Mirapex [pramipexole Dihydrochloride] Other (See Comments)   Reaction:  Unknown    Sulfa Antibiotics Nausea And Vomiting   Ciprofloxacin Other (See Comments)   Reaction:  Made pt feel crazy    Seroquel [quetiapine Fumarate]    Excessive sleepyness      Medication List       Accurate as of December 05, 2018  4:32 PM. Always use your most recent med list.        Acetaminophen 500 MG coapsule Take 500 mg by mouth 3 (three) times daily.   ALPRAZolam 0.5 MG tablet Commonly known as:  XANAX Take 1 tablet (0.5 mg total) by mouth 3 (three) times daily.   atorvastatin 10 MG tablet Commonly known as:  LIPITOR Take 10 mg by mouth at bedtime.   BIOFREEZE 4 % Gel Generic drug:  Menthol (Topical Analgesic) Apply 1 application topically 3 (three) times daily.   busPIRone 5 MG tablet Commonly known as:  BUSPAR Take 5 mg by  mouth 2 (two) times daily.   carbidopa-levodopa 25-100 MG tablet Commonly known as:  SINEMET IR Take 1 tablet by mouth every 8 (eight) hours.   clopidogrel 75 MG tablet Commonly known as:  PLAVIX Take 75 mg by mouth daily at 12 noon.   diltiazem 60 MG tablet Commonly known as:  CARDIZEM Take 1 tablet (60 mg total) by mouth every 6 (six) hours.   DULoxetine 60 MG capsule Commonly known as:  CYMBALTA Take 60 mg by mouth daily.   escitalopram 5 MG tablet Commonly known as:  LEXAPRO Take 5 mg by mouth daily.   glucose blood test strip 1 each by Other route as needed for other (ACCU-CHECKS MONDAY AND WEDNSDAY  BEFORE BREAKFAST). Use as instructed   losartan 25 MG tablet Commonly known as:  COZAAR Take 25 mg by mouth at bedtime.   Melatonin 3 MG Caps Take 3 mg by mouth at bedtime.   metFORMIN 500 MG tablet Commonly known as:  GLUCOPHAGE Take 500 mg by mouth 2 (two) times daily before a meal.   montelukast 10 MG tablet Commonly known as:  SINGULAIR Take 10 mg by mouth daily at 12 noon.   oxymetazoline 0.05 % nasal spray Commonly known as:  AFRIN Place 1 spray into both nostrils as needed (for nose bleeds).   pantoprazole sodium 40 mg/20 mL Pack Commonly known as:  PROTONIX Take 20 mLs (40 mg total) by mouth daily.   QUEtiapine 25 MG tablet Commonly known as:  SEROQUEL Take 25 mg by mouth at bedtime.   TAB-A-VITE Tabs Take 1 tablet by mouth daily at 12 noon.   Travoprost (BAK Free) 0.004 % Soln ophthalmic solution Commonly known as:  TRAVATAN Place 1 drop into both eyes at bedtime.   VENTOLIN HFA 108 (90 Base) MCG/ACT inhaler Generic drug:  albuterol Inhale 2 puffs into the lungs every 6 (six) hours as needed for wheezing or shortness of breath.       No orders of the defined types were placed in this encounter.    There is no immunization history on file for this patient.  Social History   Tobacco Use  . Smoking status: Never Smoker  . Smokeless tobacco: Never Used  Substance Use Topics  . Alcohol use: No    Family history is   Family History  Problem Relation Age of Onset  . Heart attack Mother   . Arthritis Brother   . Skin cancer Brother       Review of Systems  DATA OBTAINED: from patient, nurse GENERAL:  no fevers, fatigue, appetite changes SKIN: No itching, or rash EYES: No eye pain, redness, discharge EARS: No earache, tinnitus, change in hearing NOSE: No congestion, drainage or bleeding  MOUTH/THROAT: No mouth or tooth pain, No sore throat RESPIRATORY: No cough, wheezing, SOB CARDIAC: Central chest pain with cough, palpitations, lower  extremity edema  GI: No abdominal pain, No N/V/D or constipation, No heartburn or reflux  GU: No dysuria, frequency or urgency, or incontinence  MUSCULOSKELETAL: No unrelieved bone/joint pain NEUROLOGIC: No headache, dizziness or focal weakness PSYCHIATRIC: No c/o anxiety or sadness   Vitals:   12/05/18 1627  BP: 130/72  Pulse: 86  Resp: 19  Temp: 98.6 F (37 C)    SpO2 Readings from Last 1 Encounters:  11/25/18 93%   Body mass index is 36.56 kg/m.     Physical Exam  GENERAL APPEARANCE: Alert, conversant, looks tired SKIN: No diaphoresis rash HEAD: Normocephalic, atraumatic  EYES: Conjunctiva/lids  clear. Pupils round, reactive. EOMs intact.  EARS: External exam WNL, canals clear. Hearing grossly normal.  NOSE: No deformity or discharge.  MOUTH/THROAT: Lips w/o lesions  RESPIRATORY: Breathing is even, unlabored. Lung sounds are decreased breath sounds on the left, no rails no wheezes CARDIOVASCULAR: Heart RRR no murmurs, rubs or gallops. No peripheral edema.   GASTROINTESTINAL: Abdomen is soft, non-tender, not distended w/ normal bowel sounds. GENITOURINARY: Bladder non tender, not distended  MUSCULOSKELETAL: No abnormal joints or musculature NEUROLOGIC:  Cranial nerves 2-12 grossly intact. Moves all extremities  PSYCHIATRIC: Mood and affect appropriate to situation, no behavioral issues  Patient Active Problem List   Diagnosis Date Noted  . Insomnia 12/02/2018  . Memory disorder 11/23/2018  . GERD (gastroesophageal reflux disease) 09/05/2018  . Viral pneumonia 07/03/2018  . Acute respiratory failure with hypoxia (HCC) 07/03/2018  . Hypokalemia 07/03/2018  . Diabetes mellitus type II, controlled (HCC) 07/03/2018  . Depression 07/03/2018  . Essential hypertension   . Sepsis (HCC)   . Altered mental status   . Metabolic encephalopathy 06/17/2018  . Aspiration pneumonitis (HCC) 06/17/2018  . Tachycardia 08/12/2015  . Abnormality of gait 02/05/2015  . Palpitations  05/28/2014  . Cerebrovascular disease 05/25/2014  . Hyperlipidemia associated with type 2 diabetes mellitus (HCC) 05/25/2014  . Parkinson disease (HCC) 05/25/2014  . H/O: CVA (cerebrovascular accident) 05/25/2014  . Paralysis agitans (HCC) 08/31/2013  . Dizziness and giddiness 08/31/2013      Labs reviewed: Basic Metabolic Panel:    Component Value Date/Time   NA 143 08/15/2018   K 4.0 08/15/2018   CL 101 06/20/2018 0541   CO2 29 06/20/2018 0541   GLUCOSE 109 (H) 06/20/2018 0541   BUN 24 (A) 08/15/2018   CREATININE 0.7 08/15/2018   CREATININE 0.79 06/20/2018 0541   CALCIUM 10.2 06/20/2018 0541   PROT 7.4 06/17/2018 1554   ALBUMIN 3.9 06/17/2018 1554   AST 22 08/08/2018   ALT 26 08/08/2018   ALKPHOS 91 08/08/2018   BILITOT 0.8 06/17/2018 1554   GFRNONAA >60 06/20/2018 0541   GFRAA >60 06/20/2018 0541    Recent Labs    06/18/18 0528 06/19/18 0524 06/20/18 0541 08/08/18 08/15/18  NA 140 141 140 142 143  K 3.1* 3.6 3.7 4.0 4.0  CL 103 102 101  --   --   CO2 30 30 29   --   --   GLUCOSE 114* 123* 109*  --   --   BUN 21 20 19 20  24*  CREATININE 0.74 0.80 0.79 0.7 0.7  CALCIUM 9.9 10.0 10.2  --   --    Liver Function Tests: Recent Labs    06/17/18 1554 08/08/18  AST 34 22  ALT 26 26  ALKPHOS 71 91  BILITOT 0.8  --   PROT 7.4  --   ALBUMIN 3.9  --    No results for input(s): LIPASE, AMYLASE in the last 8760 hours. No results for input(s): AMMONIA in the last 8760 hours. CBC: Recent Labs    06/17/18 1554 06/18/18 0528 06/19/18 0524 06/20/18 0541 08/08/18  WBC 13.7* 10.0 7.3 7.3 8.5  8.5  NEUTROABS 10.7*  --  3.8 3.5  --   HGB 12.9 11.2* 11.3* 12.2 11.4*  11.4*  HCT 39.9 34.9* 35.3* 38.0 34*  34*  MCV 93.0 92.1 92.4 92.9  --   PLT 315 314 330 373 354  354   Lipid Recent Labs    08/08/18  CHOL 167  HDL 41  LDLCALC 71  TRIG 277*    Cardiac Enzymes: No results for input(s): CKTOTAL, CKMB, CKMBINDEX, TROPONINI in the last 8760 hours. BNP: No  results for input(s): BNP in the last 8760 hours. No results found for: [facility] Lab Results  Component Value Date   HGBA1C 6.2 (A) 08/08/2018   Lab Results  Component Value Date   TSH 3.09 08/08/2018   Lab Results  Component Value Date   VITAMINB12 813 05/26/2014   No results found for: FOLATE No results found for: IRON, TIBC, FERRITIN  Imaging and Procedures obtained prior to SNF admission: Dg Chest 2 View  Result Date: 06/18/2018 CLINICAL DATA:  Dyspnea EXAM: CHEST - 2 VIEW COMPARISON:  06/17/2018 FINDINGS: Mild cardiac enlargement.  Cardiac loop recorder. Progression of bibasilar airspace disease which may be atelectasis or pneumonia. No effusion or edema IMPRESSION: Progression bibasilar airspace disease. Electronically Signed   By: Marlan Palau M.D.   On: 06/18/2018 09:32   Dg Chest 2 View  Result Date: 06/17/2018 CLINICAL DATA:  Patient BIB per EMS from [facility]. Pt has had altered mental status for x 2 days and a concussion 1 week ago. Pt has had SOB on and off with increased lethargy and tachypnea per EMS. Hx of UTI-being treated with antibiotics.*comment was truncated* EXAM: CHEST - 2 VIEW COMPARISON:  08/12/2015 FINDINGS: Normal cardiac silhouette. No effusion, infiltrate pneumothorax. Mild central venous congestion. Remote RIGHT humerus fracture. Degenerative osteophytosis of the spine. IMPRESSION: No acute cardiopulmonary process. Electronically Signed   By: Genevive Bi M.D.   On: 06/17/2018 16:27   Dg Swallowing Func-speech Pathology  Result Date: 06/18/2018 Objective Swallowing Evaluation: Type of Study: MBS-Modified Barium Swallow Study  Patient Details Name: KAYLANN BEHNER MRN: [id number] Date of Birth: [birth year] Today's Date: 06/18/2018 Time: SLP Start Time (ACUTE ONLY): 1115 -SLP Stop Time (ACUTE ONLY): 1145 SLP Time Calculation (min) (ACUTE ONLY): 30 min Past Medical History: Past Medical History: Diagnosis Date . Abnormality of gait 02/05/2015 . Anxiety   . Arthritis  . Asthma  . Cerebrovascular disease   CVA '03 . Depression  . Dyslipidemia  . Endometriosis  . GERD (gastroesophageal reflux disease)  . Glaucoma  . Headache(784.0)  . Hypertension  . Macular degeneration   bilateral . Obesity  . Paralysis agitans (HCC) 08/31/2013 . Parkinson disease (HCC)  . TIA (transient ischemic attack)   Multiple Past Surgical History: Past Surgical History: Procedure Laterality Date . ABDOMINAL HYSTERECTOMY   . CATARACT EXTRACTION, BILATERAL   . GALLBLADDER SURGERY   . LOOP RECORDER IMPLANT  05-28-14  MDT LINQ implanted by Dr Graciela Husbands for cryptogenic stroke/palpitations . LOOP RECORDER IMPLANT N/A 05/28/2014  Procedure: LOOP RECORDER IMPLANT;  Surgeon: Duke Salvia, MD;  Location: [facility] CATH LAB;  Service: Cardiovascular;  Laterality: N/A; . PILONIDAL CYST RESECTION   . TILT TABLE STUDY  05-28-14  orthostatic hypertension . TILT TABLE STUDY N/A 05/28/2014  Procedure: TILT TABLE STUDY;  Surgeon: Duke Salvia, MD;  Location: [facility] CATH LAB;  Service: Cardiovascular;  Laterality: N/A; . TONSILLECTOMY   HPI: Samanthanicole Ibay Robertsonis a 85 y.o.femalewith medical history significant ofParkinson's disease and ambulatory dysfunction, wheelchair-bound. She was noted to have generalized weakness, malaise and poor appetite over the last 5 days. She was diagnosed with urine tract infection 24 hours after symptoms started and received ciprofloxacin 48 hours after her symptoms started.Despite antibiotic therapy she continued to deteriorate andtoday she was found febrile, dyspneic and ill looking appearing. She was transferred to the hospital for furtherevaluation. On direct questioning  patient mentions experiencing worsening dysphagia for the last 5 days, persistent, associated with severe coughing and choking mainly postprandial,no improving or worsening factors. Denies dyspnea but positive wheezing. She has been in a wheelchair for last few years due to her Parkinson's disease. In the  outpatient clinic itwas suggested that she hasswallow dysfunction and a swallow evaluation was planned.  Most recent CXR is showing progression of bibasilar airspace disease.   Subjective: The patient was seen in radiology for MBS. Assessment / Plan / Recommendation CHL IP CLINICAL IMPRESSIONS 06/18/2018 Clinical Impression MBS was completed using thin liquids, nectar thick liquids, pureed material and dry solids.  The patient presented with a mild  oropharyngeal dysphagia.  The oral phase was marked by decreased lingual motion that led to delayed oral transit and oral residue.  She generally displayed good oral control of the bolus but intermittently had some issues with material falling into the buccal area.  The pharyngeal phase was marked by decreased hyo-laryngeal elevation and anterior movement, decreased laryngeal vestibule closure, mildly reduced base of tongue retraction and prominence of the cricopharyngeus.  She had intermittent mild residue at the vallecula.  Penetration into the laryngeal vestibule was seen prior to the swallow given self fed cup sips of thin liquids.   Cued cough and reswallow were successful to clear material.  Chin tuck was also successful to prevent.  Esophageal sweep did not reveal overt issues.  Recommend a regular diet with thin liquids.  Patient should tuck her chin with liquids.  Use of a straw facilitated use of chin tuck.  If patient is unable to use a chin tuck recommend using nectar thick liquids.  ST will follow for therapeutic diet tolerance and to initiate swallowing therapy.  She would benefit from ST follow up at next level of care.   SLP Visit Diagnosis Dysphagia, oropharyngeal phase (R13.12) Attention and concentration deficit following -- Frontal lobe and executive function deficit following -- Impact on safety and function Mild aspiration risk   CHL IP TREATMENT RECOMMENDATION 06/18/2018 Treatment Recommendations Therapy as outlined in treatment plan below    Prognosis 06/18/2018 Prognosis for Safe Diet Advancement Fair Barriers to Reach Goals -- Barriers/Prognosis Comment -- CHL IP DIET RECOMMENDATION 06/18/2018 SLP Diet Recommendations Regular solids;Thin liquid Liquid Administration via Straw Medication Administration Crushed with puree Compensations Slow rate;Small sips/bites;Chin tuck Postural Changes Remain semi-upright after after feeds/meals (Comment)   CHL IP OTHER RECOMMENDATIONS 06/18/2018 Recommended Consults -- Oral Care Recommendations Oral care BID Other Recommendations --   CHL IP FOLLOW UP RECOMMENDATIONS 06/18/2018 Follow up Recommendations (No Data)   CHL IP FREQUENCY AND DURATION 06/18/2018 Speech Therapy Frequency (ACUTE ONLY) min 2x/week Treatment Duration 2 weeks      CHL IP ORAL PHASE 06/18/2018 Oral Phase Impaired Oral - Pudding Teaspoon -- Oral - Pudding Cup -- Oral - Honey Teaspoon -- Oral - Honey Cup -- Oral - Nectar Teaspoon -- Oral - Nectar Cup Delayed oral transit;Lingual/palatal residue Oral - Nectar Straw -- Oral - Thin Teaspoon Delayed oral transit;Lingual/palatal residue Oral - Thin Cup Lingual/palatal residue;Delayed oral transit Oral - Thin Straw -- Oral - Puree Lingual/palatal residue;Delayed oral transit Oral - Mech Soft -- Oral - Regular Delayed oral transit;Lingual/palatal residue Oral - Multi-Consistency -- Oral - Pill -- Oral Phase - Comment --  CHL IP PHARYNGEAL PHASE 06/18/2018 Pharyngeal Phase Impaired Pharyngeal- Pudding Teaspoon -- Pharyngeal -- Pharyngeal- Pudding Cup -- Pharyngeal -- Pharyngeal- Honey Teaspoon -- Pharyngeal -- Pharyngeal- Honey Cup -- Pharyngeal -- Pharyngeal- Nectar Teaspoon --  Pharyngeal -- Pharyngeal- Nectar Cup Delayed swallow initiation-vallecula Pharyngeal -- Pharyngeal- Nectar Straw -- Pharyngeal -- Pharyngeal- Thin Teaspoon Delayed swallow initiation-vallecula;Reduced laryngeal elevation;Reduced anterior laryngeal mobility Pharyngeal -- Pharyngeal- Thin Cup Delayed swallow initiation-pyriform  sinuses;Reduced laryngeal elevation;Reduced anterior laryngeal mobility;Penetration/Aspiration before swallow;Reduced airway/laryngeal closure;Pharyngeal residue - valleculae Pharyngeal Material enters airway, remains ABOVE vocal cords and not ejected out Pharyngeal- Thin Straw -- Pharyngeal -- Pharyngeal- Puree Delayed swallow initiation-vallecula Pharyngeal -- Pharyngeal- Mechanical Soft -- Pharyngeal -- Pharyngeal- Regular Delayed swallow initiation-vallecula Pharyngeal -- Pharyngeal- Multi-consistency -- Pharyngeal -- Pharyngeal- Pill -- Pharyngeal -- Pharyngeal Comment --  No flowsheet data found. Dimas Aguas, MA, CCC-SLP Acute Rehab SLP [phone] Fleet Contras 06/18/2018, 12:35 PM                Not all labs, radiology exams or other studies done during hospitalization come through on my EPIC note; however they are reviewed by me.    Assessment and Plan  Cough/bronchitis versus pneumonia- patient is allergic to sulfa and and Cipro- will treat with IM Rocephin 1 g daily until chest x-ray returns, then may change treatment accordingly    Thurston Hole D. Lyn Hollingshead, MD

## 2018-12-06 ENCOUNTER — Non-Acute Institutional Stay (SKILLED_NURSING_FACILITY): Payer: Medicare Other | Admitting: Internal Medicine

## 2018-12-06 ENCOUNTER — Encounter: Payer: Self-pay | Admitting: Internal Medicine

## 2018-12-06 DIAGNOSIS — J40 Bronchitis, not specified as acute or chronic: Secondary | ICD-10-CM

## 2018-12-06 NOTE — Progress Notes (Signed)
Opened in error

## 2018-12-06 NOTE — Progress Notes (Signed)
:  Location:  [facility] Room Number: 317P Place of Service:  SNF (31)  Jasmine Herber D. Lyn Hollingshead, MD  Patient Care Team: Manus Gunning, FNP as PCP - General (Nurse Practitioner) Herschel Senegal, MD as Referring Physician (Internal Medicine)  Extended Emergency Contact Information Primary Emergency Contact: Dyke Maes [place] Home Phone: [phone] Mobile Phone: [phone] Relation: Son Secondary Emergency Contact: Miller,Laurie Mobile Phone: [phone] Relation: Daughter     Allergies: Comtan [entacapone]; Mirapex [pramipexole dihydrochloride]; Sulfa antibiotics; Ciprofloxacin; and Seroquel [quetiapine fumarate]  Chief Complaint  Patient presents with  . Acute Visit    HPI: Patient is 85 y.o. female who is being seen in follow-up to visit yesterday.  Patient's chest x-ray was negative-likely need to change her antibiotic to something less aggressive than Rocephin IM.  Also she needs prevention, as her daughter states that she has a problem with bronchitis and respiratory infections but it is not documented whether she has asthma or COPD.  Patient has the same complaints as yesterday.  Past Medical History:  Diagnosis Date  . Abnormality of gait 02/05/2015  . Anxiety   . Arthritis   . Asthma   . Cerebrovascular disease    CVA '03  . Depression   . Dyslipidemia   . Endometriosis   . GERD (gastroesophageal reflux disease)   . Glaucoma   . Headache(784.0)   . Hypertension   . Macular degeneration    bilateral  . Memory disorder 11/23/2018  . Obesity   . Paralysis agitans (HCC) 08/31/2013  . Parkinson disease (HCC)   . TIA (transient ischemic attack)    Multiple    Past Surgical History:  Procedure Laterality Date  . ABDOMINAL HYSTERECTOMY    . CATARACT EXTRACTION, BILATERAL    . GALLBLADDER SURGERY    . LOOP RECORDER IMPLANT  05-28-14   MDT LINQ implanted by Dr Graciela Husbands for cryptogenic stroke/palpitations  .  LOOP RECORDER IMPLANT N/A 05/28/2014   Procedure: LOOP RECORDER IMPLANT;  Surgeon: Duke Salvia, MD;  Location: [facility] CATH LAB;  Service: Cardiovascular;  Laterality: N/A;  . PILONIDAL CYST RESECTION    . TILT TABLE STUDY  05-28-14   orthostatic hypertension  . TILT TABLE STUDY N/A 05/28/2014   Procedure: TILT TABLE STUDY;  Surgeon: Duke Salvia, MD;  Location: [facility] CATH LAB;  Service: Cardiovascular;  Laterality: N/A;  . TONSILLECTOMY      Allergies as of 12/06/2018      Reactions   Comtan [entacapone] Other (See Comments)   Reaction:  Unknown    Mirapex [pramipexole Dihydrochloride] Other (See Comments)   Reaction:  Unknown    Sulfa Antibiotics Nausea And Vomiting   Ciprofloxacin Other (See Comments)   Reaction:  Made pt feel crazy    Seroquel [quetiapine Fumarate]    Excessive sleepyness      Medication List       Accurate as of December 06, 2018  3:41 PM. Always use your most recent med list.        Acetaminophen 500 MG coapsule Take 500 mg by mouth 3 (three) times daily.   ALPRAZolam 0.5 MG tablet Commonly known as:  XANAX Take 1 tablet (0.5 mg total) by mouth 3 (three) times daily.   atorvastatin 10 MG tablet Commonly known as:  LIPITOR Take 10 mg by mouth at bedtime.   BIOFREEZE 4 % Gel Generic drug:  Menthol (Topical Analgesic) Apply 1 application topically 3 (three) times daily.   busPIRone 5  MG tablet Commonly known as:  BUSPAR Take 5 mg by mouth 2 (two) times daily.   carbidopa-levodopa 25-100 MG tablet Commonly known as:  SINEMET IR Take 1 tablet by mouth every 8 (eight) hours.   cefTRIAXone 1 g injection Commonly known as:  ROCEPHIN Inject into the muscle once. GIVE 1 GM, RECONSTITUED WITH 2.1 ML LIDOCAINE, IM DAILY FOR 7 DAYS FOR PNA   clopidogrel 75 MG tablet Commonly known as:  PLAVIX Take 75 mg by mouth daily at 12 noon.   diltiazem 60 MG tablet Commonly known as:  CARDIZEM Take 1 tablet (60 mg total) by mouth every 6 (six) hours.     DULoxetine 60 MG capsule Commonly known as:  CYMBALTA Take 60 mg by mouth daily.   escitalopram 5 MG tablet Commonly known as:  LEXAPRO Take 5 mg by mouth daily.   glucose blood test strip 1 each by Other route as needed for other (ACCU-CHECKS MONDAY AND WEDNSDAY BEFORE BREAKFAST). Use as instructed   losartan 25 MG tablet Commonly known as:  COZAAR Take 25 mg by mouth at bedtime.   Melatonin 3 MG Caps Take 3 mg by mouth at bedtime.   metFORMIN 500 MG tablet Commonly known as:  GLUCOPHAGE Take 500 mg by mouth 2 (two) times daily before a meal.   montelukast 10 MG tablet Commonly known as:  SINGULAIR Take 10 mg by mouth daily at 12 noon.   oxymetazoline 0.05 % nasal spray Commonly known as:  AFRIN Place 1 spray into both nostrils as needed (for nose bleeds).   pantoprazole sodium 40 mg/20 mL Pack Commonly known as:  PROTONIX Take 20 mLs (40 mg total) by mouth daily.   QUEtiapine 25 MG tablet Commonly known as:  SEROQUEL Take 25 mg by mouth at bedtime.   TAB-A-VITE Tabs Take 1 tablet by mouth daily at 12 noon.   Travoprost (BAK Free) 0.004 % Soln ophthalmic solution Commonly known as:  TRAVATAN Place 1 drop into both eyes at bedtime.   VENTOLIN HFA 108 (90 Base) MCG/ACT inhaler Generic drug:  albuterol Inhale 2 puffs into the lungs every 6 (six) hours as needed for wheezing or shortness of breath.       No orders of the defined types were placed in this encounter.    There is no immunization history on file for this patient.  Social History   Tobacco Use  . Smoking status: Never Smoker  . Smokeless tobacco: Never Used  Substance Use Topics  . Alcohol use: No    Family history is   Family History  Problem Relation Age of Onset  . Heart attack Mother   . Arthritis Brother   . Skin cancer Brother       Review of Systems  DATA OBTAINED: from patient, nurse GENERAL:  no fevers, fatigue, appetite changes SKIN: No itching, or rash EYES: No  eye pain, redness, discharge EARS: No earache, tinnitus, change in hearing NOSE: No congestion, drainage or bleeding  MOUTH/THROAT: No mouth or tooth pain, No sore throat RESPIRATORY: + cough, no wheezing, SOB CARDIAC: No chest pain, palpitations, lower extremity edema  GI: No abdominal pain, No N/V/D or constipation, No heartburn or reflux  GU: No dysuria, frequency or urgency, or incontinence  MUSCULOSKELETAL: No unrelieved bone/joint pain NEUROLOGIC: No headache, dizziness or focal weakness PSYCHIATRIC: No c/o anxiety or sadness   Vitals:   12/06/18 1537  BP: 130/72  Pulse: 86  Resp: 19  Temp: 98.6 F (37 C)  SpO2 Readings from Last 1 Encounters:  11/25/18 93%   Body mass index is 36.56 kg/m.     Physical Exam  GENERAL APPEARANCE: Alert, conversant,  No acute distress.  SKIN: No diaphoresis rash HEAD: Normocephalic, atraumatic  EYES: Conjunctiva/lids clear. Pupils round, reactive. EOMs intact.  EARS: External exam WNL, canals clear. Hearing grossly normal.  NOSE: No deformity or discharge.  MOUTH/THROAT: Lips w/o lesions  RESPIRATORY: Breathing is even, unlabored. Lung sounds are slightly coarse no wheezes or rails; cough CARDIOVASCULAR: Heart RRR no murmurs, rubs or gallops. No peripheral edema.   GASTROINTESTINAL: Abdomen is soft, non-tender, not distended w/ normal bowel sounds. GENITOURINARY: Bladder non tender, not distended  MUSCULOSKELETAL: No abnormal joints or musculature NEUROLOGIC:  Cranial nerves 2-12 grossly intact. Moves all extremities  PSYCHIATRIC: Mood and affect appropriate to situation, no behavioral issues  Patient Active Problem List   Diagnosis Date Noted  . Insomnia 12/02/2018  . Memory disorder 11/23/2018  . GERD (gastroesophageal reflux disease) 09/05/2018  . Viral pneumonia 07/03/2018  . Acute respiratory failure with hypoxia (HCC) 07/03/2018  . Hypokalemia 07/03/2018  . Diabetes mellitus type II, controlled (HCC) 07/03/2018  .  Depression 07/03/2018  . Essential hypertension   . Sepsis (HCC)   . Altered mental status   . Metabolic encephalopathy 06/17/2018  . Aspiration pneumonitis (HCC) 06/17/2018  . Tachycardia 08/12/2015  . Abnormality of gait 02/05/2015  . Palpitations 05/28/2014  . Cerebrovascular disease 05/25/2014  . Hyperlipidemia associated with type 2 diabetes mellitus (HCC) 05/25/2014  . Parkinson disease (HCC) 05/25/2014  . H/O: CVA (cerebrovascular accident) 05/25/2014  . Paralysis agitans (HCC) 08/31/2013  . Dizziness and giddiness 08/31/2013      Labs reviewed: Basic Metabolic Panel:    Component Value Date/Time   NA 143 08/15/2018   K 4.0 08/15/2018   CL 101 06/20/2018 0541   CO2 29 06/20/2018 0541   GLUCOSE 109 (H) 06/20/2018 0541   BUN 24 (A) 08/15/2018   CREATININE 0.7 08/15/2018   CREATININE 0.79 06/20/2018 0541   CALCIUM 10.2 06/20/2018 0541   PROT 7.4 06/17/2018 1554   ALBUMIN 3.9 06/17/2018 1554   AST 22 08/08/2018   ALT 26 08/08/2018   ALKPHOS 91 08/08/2018   BILITOT 0.8 06/17/2018 1554   GFRNONAA >60 06/20/2018 0541   GFRAA >60 06/20/2018 0541    Recent Labs    06/18/18 0528 06/19/18 0524 06/20/18 0541 08/08/18 08/15/18  NA 140 141 140 142 143  K 3.1* 3.6 3.7 4.0 4.0  CL 103 102 101  --   --   CO2 30 30 29   --   --   GLUCOSE 114* 123* 109*  --   --   BUN 21 20 19 20  24*  CREATININE 0.74 0.80 0.79 0.7 0.7  CALCIUM 9.9 10.0 10.2  --   --    Liver Function Tests: Recent Labs    06/17/18 1554 08/08/18  AST 34 22  ALT 26 26  ALKPHOS 71 91  BILITOT 0.8  --   PROT 7.4  --   ALBUMIN 3.9  --    No results for input(s): LIPASE, AMYLASE in the last 8760 hours. No results for input(s): AMMONIA in the last 8760 hours. CBC: Recent Labs    06/17/18 1554 06/18/18 0528 06/19/18 0524 06/20/18 0541 08/08/18  WBC 13.7* 10.0 7.3 7.3 8.5  8.5  NEUTROABS 10.7*  --  3.8 3.5  --   HGB 12.9 11.2* 11.3* 12.2 11.4*  11.4*  HCT 39.9 34.9* 35.3*  38.0 34*  34*    MCV 93.0 92.1 92.4 92.9  --   PLT 315 314 330 373 354  354   Lipid Recent Labs    08/08/18  CHOL 167  HDL 41  LDLCALC 71  TRIG 277*    Cardiac Enzymes: No results for input(s): CKTOTAL, CKMB, CKMBINDEX, TROPONINI in the last 8760 hours. BNP: No results for input(s): BNP in the last 8760 hours. No results found for: [facility] Lab Results  Component Value Date   HGBA1C 6.2 (A) 08/08/2018   Lab Results  Component Value Date   TSH 3.09 08/08/2018   Lab Results  Component Value Date   VITAMINB12 813 05/26/2014   No results found for: FOLATE No results found for: IRON, TIBC, FERRITIN  Imaging and Procedures obtained prior to SNF admission: Dg Chest 2 View  Result Date: 06/18/2018 CLINICAL DATA:  Dyspnea EXAM: CHEST - 2 VIEW COMPARISON:  06/17/2018 FINDINGS: Mild cardiac enlargement.  Cardiac loop recorder. Progression of bibasilar airspace disease which may be atelectasis or pneumonia. No effusion or edema IMPRESSION: Progression bibasilar airspace disease. Electronically Signed   By: Marlan Palau M.D.   On: 06/18/2018 09:32   Dg Chest 2 View  Result Date: 06/17/2018 CLINICAL DATA:  Patient BIB per EMS from [facility]. Pt has had altered mental status for x 2 days and a concussion 1 week ago. Pt has had SOB on and off with increased lethargy and tachypnea per EMS. Hx of UTI-being treated with antibiotics.*comment was truncated* EXAM: CHEST - 2 VIEW COMPARISON:  08/12/2015 FINDINGS: Normal cardiac silhouette. No effusion, infiltrate pneumothorax. Mild central venous congestion. Remote RIGHT humerus fracture. Degenerative osteophytosis of the spine. IMPRESSION: No acute cardiopulmonary process. Electronically Signed   By: Genevive Bi M.D.   On: 06/17/2018 16:27   Dg Swallowing Func-speech Pathology  Result Date: 06/18/2018 Objective Swallowing Evaluation: Type of Study: MBS-Modified Barium Swallow Study  Patient Details Name: [name] MRN: [id number] Date of  Birth: [birth year] Today's Date: 06/18/2018 Time: SLP Start Time (ACUTE ONLY): 1115 -SLP Stop Time (ACUTE ONLY): 1145 SLP Time Calculation (min) (ACUTE ONLY): 30 min Past Medical History: Past Medical History: Diagnosis Date . Abnormality of gait 02/05/2015 . Anxiety  . Arthritis  . Asthma  . Cerebrovascular disease   CVA '03 . Depression  . Dyslipidemia  . Endometriosis  . GERD (gastroesophageal reflux disease)  . Glaucoma  . Headache(784.0)  . Hypertension  . Macular degeneration   bilateral . Obesity  . Paralysis agitans (HCC) 08/31/2013 . Parkinson disease (HCC)  . TIA (transient ischemic attack)   Multiple Past Surgical History: Past Surgical History: Procedure Laterality Date . ABDOMINAL HYSTERECTOMY   . CATARACT EXTRACTION, BILATERAL   . GALLBLADDER SURGERY   . LOOP RECORDER IMPLANT  05-28-14  MDT LINQ implanted by Dr Graciela Husbands for cryptogenic stroke/palpitations . LOOP RECORDER IMPLANT N/A 05/28/2014  Procedure: LOOP RECORDER IMPLANT;  Surgeon: Duke Salvia, MD;  Location: [facility] CATH LAB;  Service: Cardiovascular;  Laterality: N/A; . PILONIDAL CYST RESECTION   . TILT TABLE STUDY  05-28-14  orthostatic hypertension . TILT TABLE STUDY N/A 05/28/2014  Procedure: TILT TABLE STUDY;  Surgeon: Duke Salvia, MD;  Location: [facility] CATH LAB;  Service: Cardiovascular;  Laterality: N/A; . TONSILLECTOMY   HPI: Sallie Rossiter Robertsonis a 85 y.o.femalewith medical history significant ofParkinson's disease and ambulatory dysfunction, wheelchair-bound. She was noted to have generalized weakness, malaise and poor appetite over the last 5 days. She was diagnosed with urine tract  infection 24 hours after symptoms started and received ciprofloxacin 48 hours after her symptoms started.Despite antibiotic therapy she continued to deteriorate andtoday she was found febrile, dyspneic and ill looking appearing. She was transferred to the hospital for furtherevaluation. On direct questioning patient mentions experiencing worsening dysphagia  for the last 5 days, persistent, associated with severe coughing and choking mainly postprandial,no improving or worsening factors. Denies dyspnea but positive wheezing. She has been in a wheelchair for last few years due to her Parkinson's disease. In the outpatient clinic itwas suggested that she hasswallow dysfunction and a swallow evaluation was planned.  Most recent CXR is showing progression of bibasilar airspace disease.   Subjective: The patient was seen in radiology for MBS. Assessment / Plan / Recommendation CHL IP CLINICAL IMPRESSIONS 06/18/2018 Clinical Impression MBS was completed using thin liquids, nectar thick liquids, pureed material and dry solids.  The patient presented with a mild  oropharyngeal dysphagia.  The oral phase was marked by decreased lingual motion that led to delayed oral transit and oral residue.  She generally displayed good oral control of the bolus but intermittently had some issues with material falling into the buccal area.  The pharyngeal phase was marked by decreased hyo-laryngeal elevation and anterior movement, decreased laryngeal vestibule closure, mildly reduced base of tongue retraction and prominence of the cricopharyngeus.  She had intermittent mild residue at the vallecula.  Penetration into the laryngeal vestibule was seen prior to the swallow given self fed cup sips of thin liquids.   Cued cough and reswallow were successful to clear material.  Chin tuck was also successful to prevent.  Esophageal sweep did not reveal overt issues.  Recommend a regular diet with thin liquids.  Patient should tuck her chin with liquids.  Use of a straw facilitated use of chin tuck.  If patient is unable to use a chin tuck recommend using nectar thick liquids.  ST will follow for therapeutic diet tolerance and to initiate swallowing therapy.  She would benefit from ST follow up at next level of care.   SLP Visit Diagnosis Dysphagia, oropharyngeal phase (R13.12) Attention and  concentration deficit following -- Frontal lobe and executive function deficit following -- Impact on safety and function Mild aspiration risk   CHL IP TREATMENT RECOMMENDATION 06/18/2018 Treatment Recommendations Therapy as outlined in treatment plan below   Prognosis 06/18/2018 Prognosis for Safe Diet Advancement Fair Barriers to Reach Goals -- Barriers/Prognosis Comment -- CHL IP DIET RECOMMENDATION 06/18/2018 SLP Diet Recommendations Regular solids;Thin liquid Liquid Administration via Straw Medication Administration Crushed with puree Compensations Slow rate;Small sips/bites;Chin tuck Postural Changes Remain semi-upright after after feeds/meals (Comment)   CHL IP OTHER RECOMMENDATIONS 06/18/2018 Recommended Consults -- Oral Care Recommendations Oral care BID Other Recommendations --   CHL IP FOLLOW UP RECOMMENDATIONS 06/18/2018 Follow up Recommendations (No Data)   CHL IP FREQUENCY AND DURATION 06/18/2018 Speech Therapy Frequency (ACUTE ONLY) min 2x/week Treatment Duration 2 weeks      CHL IP ORAL PHASE 06/18/2018 Oral Phase Impaired Oral - Pudding Teaspoon -- Oral - Pudding Cup -- Oral - Honey Teaspoon -- Oral - Honey Cup -- Oral - Nectar Teaspoon -- Oral - Nectar Cup Delayed oral transit;Lingual/palatal residue Oral - Nectar Straw -- Oral - Thin Teaspoon Delayed oral transit;Lingual/palatal residue Oral - Thin Cup Lingual/palatal residue;Delayed oral transit Oral - Thin Straw -- Oral - Puree Lingual/palatal residue;Delayed oral transit Oral - Mech Soft -- Oral - Regular Delayed oral transit;Lingual/palatal residue Oral - Multi-Consistency -- Oral - Pill --  Oral Phase - Comment --  CHL IP PHARYNGEAL PHASE 06/18/2018 Pharyngeal Phase Impaired Pharyngeal- Pudding Teaspoon -- Pharyngeal -- Pharyngeal- Pudding Cup -- Pharyngeal -- Pharyngeal- Honey Teaspoon -- Pharyngeal -- Pharyngeal- Honey Cup -- Pharyngeal -- Pharyngeal- Nectar Teaspoon -- Pharyngeal -- Pharyngeal- Nectar Cup Delayed swallow initiation-vallecula  Pharyngeal -- Pharyngeal- Nectar Straw -- Pharyngeal -- Pharyngeal- Thin Teaspoon Delayed swallow initiation-vallecula;Reduced laryngeal elevation;Reduced anterior laryngeal mobility Pharyngeal -- Pharyngeal- Thin Cup Delayed swallow initiation-pyriform sinuses;Reduced laryngeal elevation;Reduced anterior laryngeal mobility;Penetration/Aspiration before swallow;Reduced airway/laryngeal closure;Pharyngeal residue - valleculae Pharyngeal Material enters airway, remains ABOVE vocal cords and not ejected out Pharyngeal- Thin Straw -- Pharyngeal -- Pharyngeal- Puree Delayed swallow initiation-vallecula Pharyngeal -- Pharyngeal- Mechanical Soft -- Pharyngeal -- Pharyngeal- Regular Delayed swallow initiation-vallecula Pharyngeal -- Pharyngeal- Multi-consistency -- Pharyngeal -- Pharyngeal- Pill -- Pharyngeal -- Pharyngeal Comment --  No flowsheet data found. Dimas Aguas, MA, CCC-SLP Acute Rehab SLP [phone] [address] [place] 06/18/2018, 12:35 PM                Not all labs, radiology exams or other studies done during hospitalization come through on my EPIC note; however they are reviewed by me.    Assessment and Plan  Bronchitis- will change Rocephin to Zithromax, Z-Pak for 5 days; have asked for consult to pulmonology for diagnosis of COPD versus asthma    Jasmine Kole D. Lyn Hollingshead, MD

## 2018-12-07 ENCOUNTER — Telehealth: Payer: Self-pay | Admitting: Neurology

## 2018-12-07 NOTE — Telephone Encounter (Signed)
I called the rehab facility, talk with the patient's nurse.  The patient was taken off of Seroquel because of increased drowsiness, she had 2 falls on the medication.  The patient still having troubles with confusion and hallucinations, I will see her back in the office, we may need to switch her to a Nuplazid.  The walking issue may be more difficult to control, some of the patient's gait instability is related to extensive white matter disease, not just Parkinson's disease.

## 2018-12-07 NOTE — Telephone Encounter (Signed)
I contacted [employer] and spoke with secretary Izora Gala and was able to schedule f/u for 12/19/18. Izora Gala advised appt time would be at 12 pm but pt would need to arrive by 11:30 am. She verbalized understanding.

## 2018-12-07 NOTE — Telephone Encounter (Signed)
[facility] has called stating that Dr Serita Sheller wants pt to be seen by Dr Jannifer Franklin again because of an increase in falls and the QUEtiapine (SEROQUEL) 25 MG tablet is not helping.  If there will instead just be a change made to medication the Rx can be faxed to them @ [phone].  Please call

## 2018-12-08 ENCOUNTER — Ambulatory Visit (INDEPENDENT_AMBULATORY_CARE_PROVIDER_SITE_OTHER): Payer: Medicare Other | Admitting: Pulmonary Disease

## 2018-12-08 ENCOUNTER — Encounter: Payer: Self-pay | Admitting: Pulmonary Disease

## 2018-12-08 VITALS — BP 114/76 | HR 77

## 2018-12-08 DIAGNOSIS — J410 Simple chronic bronchitis: Secondary | ICD-10-CM | POA: Diagnosis not present

## 2018-12-08 DIAGNOSIS — R1312 Dysphagia, oropharyngeal phase: Secondary | ICD-10-CM

## 2018-12-08 NOTE — Patient Instructions (Signed)
Simple Chronic Bronchitis - START Xopenex nebulizer q6h as needed for shortness of breath and wheezing - No indication for steroid or antibiotic therapy - Continue supportive care   Follow-up as needed

## 2018-12-08 NOTE — Progress Notes (Signed)
Synopsis: Referred in 12/2018 for chronic bronchitis.  Subjective:   PATIENT ID: [name] GENDER: female DOB: 10-May-1930, MRN: [id number]   HPI  Chief Complaint  Patient presents with  . Consult    pts states she's been wheezing, cough w/ white mucus   Ms. Brandy Hale. Delfosse is an 85 year old female never smoker with Parkinson's disease with memory, hallucinations and gait disorder.  Referral placed by Dr. Merrilee Seashore, MD Geriatric Medicine for persistent cough associated with chest pain. She was treated with Rocephin for possible bronchitis vs pneumonia on 12/05/18. CXR was ordered but not available in our EMR. Imaging reportedly negative so Rocephin was changed to z-pack. Patient reports long standing hx of daily productive cough with white/clear sputum that is unchanged for many years associated with occasional wheeze. Denies shortness of breath. Will worsen with illness but otherwise no identified allergens. Most recently took antibiotics which she feels helped. She does not find the cough bothersome and states she is used to it. She has tried albuterol nebulizer in the past but does not like it as it worsens her tremors. She reports difficulty using inhalers due to her Parkinsons. Also reports difficulty swallowing but denies worsening cough when eating or choking on her food. Mobilizes with wheelchair.  Social History: Never smoker  Environmental exposures: Denies any known exposures  I have personally reviewed patient's past medical/family/social history, allergies, current medications.  Past Medical History:  Diagnosis Date  . Abnormality of gait 02/05/2015  . Anxiety   . Arthritis   . Asthma   . Cerebrovascular disease    CVA '03  . Depression   . Dyslipidemia   . Endometriosis   . GERD (gastroesophageal reflux disease)   . Glaucoma   . Headache(784.0)   . Hypertension   . Macular degeneration    bilateral  . Memory disorder 11/23/2018  . Obesity   .  Paralysis agitans (HCC) 08/31/2013  . Parkinson disease (HCC)   . TIA (transient ischemic attack)    Multiple     Family History  Problem Relation Age of Onset  . Heart attack Mother   . Arthritis Brother   . Skin cancer Brother      Social History   Occupational History  . Occupation: Retired  Tobacco Use  . Smoking status: Never Smoker  . Smokeless tobacco: Never Used  Substance and Sexual Activity  . Alcohol use: No  . Drug use: No  . Sexual activity: Never    Birth control/protection: Other-see comments    Allergies  Allergen Reactions  . Comtan [Entacapone] Other (See Comments)    Reaction:  Unknown   . Mirapex [Pramipexole Dihydrochloride] Other (See Comments)    Reaction:  Unknown   . Sulfa Antibiotics Nausea And Vomiting  . Ciprofloxacin Other (See Comments)    Reaction:  Made pt feel crazy   . Seroquel [Quetiapine Fumarate]     Excessive sleepyness     Outpatient Medications Prior to Visit  Medication Sig Dispense Refill  . Acetaminophen 500 MG coapsule Take 500 mg by mouth 3 (three) times daily.     Marland Kitchen albuterol (VENTOLIN HFA) 108 (90 Base) MCG/ACT inhaler Inhale 2 puffs into the lungs every 6 (six) hours as needed for wheezing or shortness of breath.    . ALPRAZolam (XANAX) 0.5 MG tablet Take 1 tablet (0.5 mg total) by mouth 3 (three) times daily. 180 tablet 2  . atorvastatin (LIPITOR) 10 MG tablet Take 10 mg by mouth  at bedtime.     . busPIRone (BUSPAR) 5 MG tablet Take 5 mg by mouth 2 (two) times daily.    . carbidopa-levodopa (SINEMET IR) 25-100 MG tablet Take 1 tablet by mouth every 8 (eight) hours. 90 tablet 0  . cefTRIAXone (ROCEPHIN) 1 g injection Inject into the muscle once. GIVE 1 GM, RECONSTITUED WITH 2.1 ML LIDOCAINE, IM DAILY FOR 7 DAYS FOR PNA    . clopidogrel (PLAVIX) 75 MG tablet Take 75 mg by mouth daily at 12 noon.     . diltiazem (CARDIZEM) 60 MG tablet Take 1 tablet (60 mg total) by mouth every 6 (six) hours. 120 tablet 0  . DULoxetine  (CYMBALTA) 60 MG capsule Take 60 mg by mouth daily.    Marland Kitchen escitalopram (LEXAPRO) 5 MG tablet Take 5 mg by mouth daily.    Marland Kitchen glucose blood test strip 1 each by Other route as needed for other (ACCU-CHECKS MONDAY AND WEDNSDAY BEFORE BREAKFAST). Use as instructed    . losartan (COZAAR) 25 MG tablet Take 25 mg by mouth at bedtime.     . Melatonin 3 MG CAPS Take 3 mg by mouth at bedtime.    . Menthol, Topical Analgesic, (BIOFREEZE) 4 % GEL Apply 1 application topically 3 (three) times daily.    . metFORMIN (GLUCOPHAGE) 500 MG tablet Take 500 mg by mouth 2 (two) times daily before a meal.     . montelukast (SINGULAIR) 10 MG tablet Take 10 mg by mouth daily at 12 noon.     . Multiple Vitamin (TAB-A-VITE) TABS Take 1 tablet by mouth daily at 12 noon.     Marland Kitchen oxymetazoline (AFRIN) 0.05 % nasal spray Place 1 spray into both nostrils as needed (for nose bleeds).    . pantoprazole sodium (PROTONIX) 40 mg/20 mL PACK Take 20 mLs (40 mg total) by mouth daily. 30 each 0  . QUEtiapine (SEROQUEL) 25 MG tablet Take 25 mg by mouth at bedtime.    . Travoprost, BAK Free, (TRAVATAN) 0.004 % SOLN ophthalmic solution Place 1 drop into both eyes at bedtime.     No facility-administered medications prior to visit.     Review of Systems  Constitutional: Negative for chills, diaphoresis, fever, malaise/fatigue and weight loss.  HENT: Negative for congestion, ear pain, sinus pain and sore throat.   Respiratory: Positive for cough, sputum production and wheezing. Negative for hemoptysis and shortness of breath.   Cardiovascular: Positive for chest pain and palpitations. Negative for leg swelling.  Gastrointestinal: Negative for abdominal pain, heartburn and nausea.  Genitourinary: Negative for frequency.  Musculoskeletal: Negative for joint pain and myalgias.  Skin: Negative for itching and rash.  Neurological: Positive for tremors. Negative for dizziness, focal weakness, weakness and headaches.  Endo/Heme/Allergies: Does  not bruise/bleed easily.  Psychiatric/Behavioral: Negative for depression. The patient is not nervous/anxious.     Objective:   Vitals:   12/08/18 1054  BP: 114/76  Pulse: 77  SpO2: 97%   SpO2: 97 %  Physical Exam General: Well-appearing, no acute distress HENT: [place], AT, OP clear, MMM Eyes: EOMI, no scleral icterus Respiratory: Clear to auscultation bilaterally.  No crackles, wheezing or rales Cardiovascular: RRR, -M/R/G, no JVD GI: BS+, soft, nontender Extremities:-Edema,-tenderness Neuro: AAO x4, follows commands Skin: Intact, no rashes or bruising Psych: Normal mood, normal affect, no active hallucinations  Chest imaging:  PFT: None on file  Imaging, labs and tests noted above have been reviewed independently by me.    Assessment & Plan:  Discussion: 85 year old female never smoker with Parkinson's disease with reported history consistent with chronic bronchitis. In the absence of a smoking history, childhood diagnoses/symptoms of asthma and environmental exposures, I have a low suspicion for any underlying obstructive lung disease. I do worry about aspiration in patients with Parkinsons and on review of EMR, she has swallow evaluation confirming dysphagia.   Patient states she has difficulty using handheld inhalers. She also self-discontinued albuterol nebulizers due to worsening tremors and difficulty using as well. Due to patient's inability to handle inhalers, I do not believe additional testing such as pulmonary function tests will be accurate or beneficial for her.   I would recommend starting with Xopenex nebulizer to see if this would provide any symptom relief. In the future, we can consider adding scheduled atrovent inhaler.   Simple Chronic Bronchitis - START Xopenex nebulizer q6h as needed for shortness of breath and wheezing - No indication for steroid or antibiotic therapy - Aspiration precautions - Continue supportive care   No orders of the defined  types were placed in this encounter. No orders of the defined types were placed in this encounter.   Return if symptoms worsen or fail to improve.  Avien Taha Mechele Collin, MD [facility] 12/11/2018 10:22 AM  Personal pager: [phone] If unanswered, please page CCM On-call: #[phone]

## 2018-12-10 ENCOUNTER — Encounter: Payer: Self-pay | Admitting: Internal Medicine

## 2018-12-11 DIAGNOSIS — J410 Simple chronic bronchitis: Secondary | ICD-10-CM | POA: Insufficient documentation

## 2018-12-11 DIAGNOSIS — R1310 Dysphagia, unspecified: Secondary | ICD-10-CM | POA: Insufficient documentation

## 2018-12-19 ENCOUNTER — Ambulatory Visit: Payer: Medicare Other | Admitting: Neurology

## 2018-12-19 ENCOUNTER — Encounter: Payer: Self-pay | Admitting: Neurology

## 2018-12-19 VITALS — BP 110/76 | HR 83

## 2018-12-19 DIAGNOSIS — I679 Cerebrovascular disease, unspecified: Secondary | ICD-10-CM

## 2018-12-19 DIAGNOSIS — R269 Unspecified abnormalities of gait and mobility: Secondary | ICD-10-CM

## 2018-12-19 DIAGNOSIS — G20 Parkinson's disease: Secondary | ICD-10-CM | POA: Diagnosis not present

## 2018-12-19 MED ORDER — PIMAVANSERIN TARTRATE 34 MG PO CAPS: 34.0000 mg | ORAL_CAPSULE | Freq: Every day | ORAL | 3 refills | Status: DC

## 2018-12-19 NOTE — Progress Notes (Signed)
Reason for visit: Parkinson's disease, cerebrovascular disease, dementia, hallucinations  [name] is an 85 y.o. female  History of present illness:  [name] is an 85 year old right-handed white female with a history of extensive white matter changes consistent with cerebrovascular disease, she has a gait disorder and a history of Parkinson's disease.  The patient remains on low-dose Sinemet taking the 25/100 mg tablets 3 times daily.  The patient has had hallucinations off and on, she was placed on Seroquel but this resulted in too much sedation during the day.  The patient has been taken off of Seroquel and comes back to the office today for an evaluation.  The patient is nonambulatory, she requires a Hoyer lift for transfer from the bed to the chair.  The patient has minimal use of the right arm secondary to a prior fracture.  The patient uses her left hand for feeding herself.  She indicates that the hallucinations are usually not threatening to her.  She returns for an evaluation.  Past Medical History:  Diagnosis Date  . Abnormality of gait 02/05/2015  . Anxiety   . Arthritis   . Asthma   . Cerebrovascular disease    CVA '03  . Depression   . Dyslipidemia   . Endometriosis   . GERD (gastroesophageal reflux disease)   . Glaucoma   . Headache(784.0)   . Hypertension   . Macular degeneration    bilateral  . Memory disorder 11/23/2018  . Obesity   . Paralysis agitans ([place]) 08/31/2013  . Parkinson disease ([place])   . TIA (transient ischemic attack)    Multiple    Past Surgical History:  Procedure Laterality Date  . ABDOMINAL HYSTERECTOMY    . CATARACT EXTRACTION, BILATERAL    . GALLBLADDER SURGERY    . LOOP RECORDER IMPLANT  05-28-14   MDT LINQ implanted by Dr Caryl Comes for cryptogenic stroke/palpitations  . LOOP RECORDER IMPLANT N/A 05/28/2014   Procedure: LOOP RECORDER IMPLANT;  Surgeon: Deboraha Sprang, MD;  Location: [facility] CATH LAB;  Service: Cardiovascular;   Laterality: N/A;  . PILONIDAL CYST RESECTION    . TILT TABLE STUDY  05-28-14   orthostatic hypertension  . TILT TABLE STUDY N/A 05/28/2014   Procedure: TILT TABLE STUDY;  Surgeon: Deboraha Sprang, MD;  Location: [facility] CATH LAB;  Service: Cardiovascular;  Laterality: N/A;  . TONSILLECTOMY      Family History  Problem Relation Age of Onset  . Heart attack Mother   . Arthritis Brother   . Skin cancer Brother     Social history:  reports that she has never smoked. She has never used smokeless tobacco. She reports that she does not drink alcohol or use drugs.    Allergies  Allergen Reactions  . Comtan [Entacapone] Other (See Comments)    Reaction:  Unknown   . Mirapex [Pramipexole Dihydrochloride] Other (See Comments)    Reaction:  Unknown   . Sulfa Antibiotics Nausea And Vomiting  . Ciprofloxacin Other (See Comments)    Reaction:  Made pt feel crazy   . Seroquel [Quetiapine Fumarate]     Excessive sleepyness    Medications:  Prior to Admission medications   Medication Sig Start Date End Date Taking? Authorizing Provider  Acetaminophen 500 MG coapsule Take 500 mg by mouth 3 (three) times daily.    Yes [provider]  albuterol (VENTOLIN HFA) 108 (90 Base) MCG/ACT inhaler Inhale 2 puffs into the lungs every 6 (six) hours as needed  for wheezing or shortness of breath.   Yes [provider]  ALPRAZolam (XANAX) 0.5 MG tablet Take 1 tablet (0.5 mg total) by mouth 3 (three) times daily. 11/08/18  Yes Hennie Duos, MD  atorvastatin (LIPITOR) 10 MG tablet Take 10 mg by mouth at bedtime.    Yes [provider]  busPIRone (BUSPAR) 5 MG tablet Take 5 mg by mouth 2 (two) times daily.   Yes [provider]  cefTRIAXone (ROCEPHIN) 1 g injection Inject into the muscle once. GIVE 1 GM, RECONSTITUED WITH 2.1 ML LIDOCAINE, IM DAILY FOR 7 DAYS FOR PNA   Yes [provider]  clopidogrel (PLAVIX) 75 MG tablet Take 75 mg by mouth daily at 12 noon.    Yes  [provider]  DULoxetine (CYMBALTA) 60 MG capsule Take 60 mg by mouth daily.   Yes [provider]  escitalopram (LEXAPRO) 5 MG tablet Take 5 mg by mouth daily.   Yes [provider]  glucose blood test strip 1 each by Other route as needed for other ([place]). Use as instructed   Yes [provider]  losartan (COZAAR) 25 MG tablet Take 25 mg by mouth at bedtime.    Yes [provider]  Melatonin 3 MG CAPS Take 3 mg by mouth at bedtime.   Yes [provider]  Menthol, Topical Analgesic, (BIOFREEZE) 4 % GEL Apply 1 application topically 3 (three) times daily.   Yes [provider]  metFORMIN (GLUCOPHAGE) 500 MG tablet Take 500 mg by mouth 2 (two) times daily before a meal.    Yes [provider]  montelukast (SINGULAIR) 10 MG tablet Take 10 mg by mouth daily at 12 noon.    Yes [provider]  Multiple Vitamin (TAB-A-VITE) TABS Take 1 tablet by mouth daily at 12 noon.    Yes [provider]  oxymetazoline (AFRIN) 0.05 % nasal spray Place 1 spray into both nostrils as needed (for nose bleeds).   Yes [provider]  QUEtiapine (SEROQUEL) 25 MG tablet Take 25 mg by mouth at bedtime.   Yes [provider]  Travoprost, BAK Free, (TRAVATAN) 0.004 % SOLN ophthalmic solution Place 1 drop into both eyes at bedtime.   Yes [provider]  carbidopa-levodopa (SINEMET IR) 25-100 MG tablet Take 1 tablet by mouth every 8 (eight) hours. 06/21/18 11/30/18  Arrien, Jimmy Picket, MD  diltiazem (CARDIZEM) 60 MG tablet Take 1 tablet (60 mg total) by mouth every 6 (six) hours. 06/21/18 12/05/18  Arrien, Jimmy Picket, MD  pantoprazole sodium (PROTONIX) 40 mg/20 mL PACK Take 20 mLs (40 mg total) by mouth daily. 06/22/18 11/30/18  Arrien, Jimmy Picket, MD    ROS:  Out of a complete 14 system review of symptoms, the patient complains only of the following  symptoms, and all other reviewed systems are negative.  Memory problems Hallucinations  Blood pressure 110/76, pulse 83, SpO2 94 %.  Physical Exam  General: The patient is alert and cooperative at the time of the examination.  The patient is moderately obese.  Skin: No significant peripheral edema is noted.   Neurologic Exam  Mental status: The patient is alert and oriented x 3 at the time of the examination. The Mini-Mental status examination done today shows a total score of 22/30.   Cranial nerves: Facial symmetry is present. Speech is normal, no aphasia or dysarthria is noted. Extraocular movements are full. Visual fields are full.  Motor: The  patient has good strength in all 4 extremities, the patient has significant pain with use of the right arm, cannot lift the arm.  Sensory examination: Soft touch sensation is symmetric on the face, arms, and legs.  Coordination: The patient has good finger-nose-finger on the left and heel-to-shin bilaterally.  The patient is unable to perform finger-nose-finger with the right arm.  Gait and station: The patient is nonambulatory, wheelchair-bound  Reflexes: Deep tendon reflexes are symmetric.   Assessment/Plan:  1.  Parkinson's disease  2.  Cerebrovascular disease  3.  Dementia  4.  Hallucinations  5.  Gait disturbance  The patient could not tolerate the Seroquel for the hallucinations, we will start Nuplazid taking 34 mg daily.  Full benefit of the medication may take 4 to 6 weeks.  The patient will contact our office if she is not tolerating the drug.  She will follow-up for the next scheduled visit in July.  Jill Alexanders MD 12/19/2018 1:04 PM  [facility] [address] [place], [place] [address]  Phone [phone] Fax [phone]

## 2018-12-19 NOTE — Patient Instructions (Signed)
We will start Nuplazid for the hallucinations.

## 2018-12-20 ENCOUNTER — Telehealth: Payer: Self-pay | Admitting: Neurology

## 2018-12-20 NOTE — Telephone Encounter (Signed)
The form has been signed.

## 2018-12-20 NOTE — Telephone Encounter (Signed)
PA for Nuplazid has been completed and placed in MD's office to review/sign if PA is appropriate.

## 2018-12-20 NOTE — Telephone Encounter (Signed)
Jay advised pt's insurance will not cover Pimavanserin Tartrate (NUPLAZID) 34 MG CAPS. Is there an alternative? Please call to advise

## 2018-12-21 ENCOUNTER — Telehealth: Payer: Self-pay

## 2018-12-21 NOTE — Telephone Encounter (Signed)
PA for nuplazid has been approved. Effective dates 12/20/18-11/02/19. Caren Griffins at [facility] has been notified verbally of approval.

## 2018-12-21 NOTE — Telephone Encounter (Signed)
PA approval received for Nuplazid 34 mg. Effective dates 12/20/18-11/02/19 PA Ref # [id number].

## 2018-12-21 NOTE — Telephone Encounter (Signed)
PA for Nuplazid has been submitted to [facility] via fax. F # [id number]. Confirmation received.

## 2018-12-22 ENCOUNTER — Non-Acute Institutional Stay (SKILLED_NURSING_FACILITY): Payer: Medicare Other | Admitting: Internal Medicine

## 2018-12-22 DIAGNOSIS — G20 Parkinson's disease: Secondary | ICD-10-CM | POA: Diagnosis not present

## 2018-12-22 DIAGNOSIS — F28 Other psychotic disorder not due to a substance or known physiological condition: Secondary | ICD-10-CM | POA: Diagnosis not present

## 2018-12-23 ENCOUNTER — Encounter: Payer: Self-pay | Admitting: Internal Medicine

## 2018-12-23 NOTE — Progress Notes (Signed)
Location:  [facility] Room Number: 235T Place of Service:  SNF (31)  [name], MD  Patient Care Team: Jacklyn Shell, FNP as PCP - General (Nurse Practitioner) Marylynn Pearson, MD as Referring Physician (Internal Medicine)  Extended Emergency Contact Information Primary Emergency Contact: Macky [place] Phone: [phone] Mobile Phone: [phone] Relation: Son Secondary Emergency Contact: [place] Mobile Phone: [phone] Relation: Daughter    Allergies: Comtan [entacapone]; Mirapex [pramipexole dihydrochloride]; Sulfa antibiotics; Ciprofloxacin; and Seroquel [quetiapine fumarate]  Chief Complaint  Patient presents with  . Acute Visit    HPI: Patient is 85 y.o. female who nursing asked me to see because she is wandering the building in her wheelchair looking for her husband.  Patient's husband died over a year ago and patient has had normal grief for someone who was married for 46 years.  Patient told me she is in her husband last night and he did not try to talk to her or make contact with her and she has cried for hours.  Past Medical History:  Diagnosis Date  . Abnormality of gait 02/05/2015  . Anxiety   . Arthritis   . Asthma   . Cerebrovascular disease    CVA '03  . Depression   . Dyslipidemia   . Endometriosis   . GERD (gastroesophageal reflux disease)   . Glaucoma   . Headache(784.0)   . Hypertension   . Macular degeneration    bilateral  . Memory disorder 11/23/2018  . Obesity   . Paralysis agitans ([place]) 08/31/2013  . Parkinson disease ([place])   . TIA (transient ischemic attack)    Multiple    Past Surgical History:  Procedure Laterality Date  . ABDOMINAL HYSTERECTOMY    . CATARACT EXTRACTION, BILATERAL    . GALLBLADDER SURGERY    . LOOP RECORDER IMPLANT  05-28-14   MDT LINQ implanted by Dr Caryl Comes for cryptogenic stroke/palpitations  . LOOP RECORDER IMPLANT N/A  05/28/2014   Procedure: LOOP RECORDER IMPLANT;  Surgeon: Deboraha Sprang, MD;  Location: [facility] CATH LAB;  Service: Cardiovascular;  Laterality: N/A;  . PILONIDAL CYST RESECTION    . TILT TABLE STUDY  05-28-14   orthostatic hypertension  . TILT TABLE STUDY N/A 05/28/2014   Procedure: TILT TABLE STUDY;  Surgeon: Deboraha Sprang, MD;  Location: [facility] CATH LAB;  Service: Cardiovascular;  Laterality: N/A;  . TONSILLECTOMY      Allergies as of 12/22/2018      Reactions   Comtan [entacapone] Other (See Comments)   Reaction:  Unknown    Mirapex [pramipexole Dihydrochloride] Other (See Comments)   Reaction:  Unknown    Sulfa Antibiotics Nausea And Vomiting   Ciprofloxacin Other (See Comments)   Reaction:  Made pt feel crazy    Seroquel [quetiapine Fumarate]    Excessive sleepyness      Medication List       Accurate as of December 22, 2018 11:59 PM. Always use your most recent med list.        Acetaminophen 500 MG coapsule Take 500 mg by mouth 3 (three) times daily.   ALPRAZolam 0.5 MG tablet Commonly known as:  XANAX Take 1 tablet (0.5 mg total) by mouth 3 (three) times daily.   atorvastatin 10 MG tablet Commonly known as:  LIPITOR Take 10 mg by mouth at bedtime.   BIOFREEZE 4 % Gel Generic drug:  Menthol (Topical Analgesic) Apply 1 application topically 3 (three) times daily.  busPIRone 5 MG tablet Commonly known as:  BUSPAR Take 5 mg by mouth 2 (two) times daily.   carbidopa-levodopa 25-100 MG tablet Commonly known as:  SINEMET IR Take 1 tablet by mouth every 8 (eight) hours.   clopidogrel 75 MG tablet Commonly known as:  PLAVIX Take 75 mg by mouth daily at 12 noon.   diltiazem 60 MG tablet Commonly known as:  CARDIZEM Take 1 tablet (60 mg total) by mouth every 6 (six) hours.   DULoxetine 60 MG capsule Commonly known as:  CYMBALTA Take 60 mg by mouth daily.   escitalopram 5 MG tablet Commonly known as:  LEXAPRO Take 5 mg by mouth daily.   glucose blood test  strip 1 each by Other route as needed for other ([place]). Use as instructed   levalbuterol 0.63 MG/3ML nebulizer solution Commonly known as:  XOPENEX Take 0.63 mg by nebulization every 6 (six) hours as needed for wheezing or shortness of breath.   losartan 25 MG tablet Commonly known as:  COZAAR Take 25 mg by mouth at bedtime.   Melatonin 3 MG Caps Take 3 mg by mouth at bedtime.   metFORMIN 500 MG tablet Commonly known as:  GLUCOPHAGE Take 500 mg by mouth 2 (two) times daily before a meal.   montelukast 10 MG tablet Commonly known as:  SINGULAIR Take 10 mg by mouth daily at 12 noon.   oxymetazoline 0.05 % nasal spray Commonly known as:  AFRIN Place 1 spray into both nostrils as needed (for nose bleeds).   pantoprazole sodium 40 mg/20 mL Pack Commonly known as:  PROTONIX Take 20 mLs (40 mg total) by mouth daily.   TAB-A-VITE Tabs Take 1 tablet by mouth daily at 12 noon.   Travoprost (BAK Free) 0.004 % Soln ophthalmic solution Commonly known as:  TRAVATAN Place 1 drop into both eyes at bedtime.   VENTOLIN HFA 108 (90 Base) MCG/ACT inhaler Generic drug:  albuterol Inhale 2 puffs into the lungs every 6 (six) hours as needed for wheezing or shortness of breath.       No orders of the defined types were placed in this encounter.   Immunization History  Administered Date(s) Administered  . Influenza Split 07/08/2018    Social History   Tobacco Use  . Smoking status: Never Smoker  . Smokeless tobacco: Never Used  Substance Use Topics  . Alcohol use: No    Review of Systems  DATA OBTAINED: from patient GENERAL:  no fevers, fatigue, appetite changes SKIN: No itching, rash HEENT: No complaint RESPIRATORY: No cough, wheezing, SOB CARDIAC: No chest pain, palpitations, lower extremity edema  GI: No abdominal pain, No N/V/D or constipation, No heartburn or reflux  GU: No dysuria, frequency or urgency, or incontinence   MUSCULOSKELETAL: No unrelieved bone/joint pain NEUROLOGIC: No headache, dizziness  PSYCHIATRIC: No overt anxiety or sadness  Vitals:   12/23/18 1613  BP: 130/74  Pulse: 86  Resp: 18  Temp: 97.9 F (36.6 C)   Body mass index is 36.4 kg/m. Physical Exam  GENERAL APPEARANCE: Alert, conversant, No acute distress  SKIN: No diaphoresis rash HEENT: Unremarkable RESPIRATORY: Breathing is even, unlabored. Lung sounds are clear   CARDIOVASCULAR: Heart RRR no murmurs, rubs or gallops. No peripheral edema  GASTROINTESTINAL: Abdomen is soft, non-tender, not distended w/ normal bowel sounds.  GENITOURINARY: Bladder non tender, not distended  MUSCULOSKELETAL: No abnormal joints or musculature NEUROLOGIC: Cranial nerves 2-12 grossly intact. Moves all extremities PSYCHIATRIC: Mood and  affect appropriate to situation, dementia, no behavioral issues  Patient Active Problem List   Diagnosis Date Noted  . Dysphagia 12/11/2018  . Simple chronic bronchitis ([place]) 12/11/2018  . Insomnia 12/02/2018  . Memory disorder 11/23/2018  . GERD (gastroesophageal reflux disease) 09/05/2018  . Diabetes mellitus type II, controlled ([place]) 07/03/2018  . Depression 07/03/2018  . Essential hypertension   . Hx of aspiration pneumonitis 06/17/2018  . Abnormality of gait 02/05/2015  . Palpitations 05/28/2014  . Cerebrovascular disease 05/25/2014  . Hyperlipidemia associated with type 2 diabetes mellitus ([place]) 05/25/2014  . Parkinson disease ([place]) 05/25/2014  . H/O: CVA (cerebrovascular accident) 05/25/2014  . Paralysis agitans ([place]) 08/31/2013  . Dizziness and giddiness 08/31/2013    CMP     Component Value Date/Time   NA 143 08/15/2018   K 4.0 08/15/2018   CL 101 06/20/2018 0541   CO2 29 06/20/2018 0541   GLUCOSE 109 (H) 06/20/2018 0541   BUN 24 (A) 08/15/2018   CREATININE 0.7 08/15/2018   CREATININE 0.79 06/20/2018 0541   CALCIUM 10.2 06/20/2018 0541   PROT 7.4 06/17/2018 1554   ALBUMIN 3.9  06/17/2018 1554   AST 22 08/08/2018   ALT 26 08/08/2018   ALKPHOS 91 08/08/2018   BILITOT 0.8 06/17/2018 1554   GFRNONAA >60 06/20/2018 0541   GFRAA >60 06/20/2018 0541   Recent Labs    06/18/18 0528 06/19/18 0524 06/20/18 0541 08/08/18 08/15/18  NA 140 141 140 142 143  K 3.1* 3.6 3.7 4.0 4.0  CL 103 102 101  --   --   CO2 30 30 29   --   --   GLUCOSE 114* 123* 109*  --   --   BUN 21 20 19 20  24*  CREATININE 0.74 0.80 0.79 0.7 0.7  CALCIUM 9.9 10.0 10.2  --   --    Recent Labs    06/17/18 1554 08/08/18  AST 34 22  ALT 26 26  ALKPHOS 71 91  BILITOT 0.8  --   PROT 7.4  --   ALBUMIN 3.9  --    Recent Labs    06/17/18 1554 06/18/18 0528 06/19/18 0524 06/20/18 0541 08/08/18  WBC 13.7* 10.0 7.3 7.3 8.5  8.5  NEUTROABS 10.7*  --  3.8 3.5  --   HGB 12.9 11.2* 11.3* 12.2 11.4*  11.4*  HCT 39.9 34.9* 35.3* 38.0 34*  34*  MCV 93.0 92.1 92.4 92.9  --   PLT 315 314 330 373 354  354   Recent Labs    08/08/18  CHOL 167  LDLCALC 71  TRIG 277*   No results found for: [facility] Lab Results  Component Value Date   TSH 3.09 08/08/2018   Lab Results  Component Value Date   HGBA1C 6.2 (A) 08/08/2018   Lab Results  Component Value Date   CHOL 167 08/08/2018   HDL 41 08/08/2018   LDLCALC 71 08/08/2018   TRIG 277 (A) 08/08/2018   CHOLHDL 5.3 05/26/2014    Significant Diagnostic Results in last 30 days:  No results found.  Assessment and Plan  Psychosis, Parkinson's disease associated- patient is undoubtedly having hallucinations of her husband; the only reason they were distressing to her is because he did not speak to her or try to engage with her; I spoke with her at length to reorient her which was not difficult-she remembers that her husband died and that he is in heaven with God; then we spoke of particular parts of her hallucination  what they may or may not mean; patient has been tried on Seroquel twice which causes excessive sleepiness that goes on for  several days; patient was prescribed a new antipsychotic by neurology,,Neuplazid, which is horrendously expensive but the practice has been able to get her the medication without cost; we will start this medication hopefully on Monday.  In the meantime patient is calm and content after her talk.   Time spent greater than 35 minutes Cindra Austad D. [name], MD

## 2018-12-24 ENCOUNTER — Encounter: Payer: Self-pay | Admitting: Internal Medicine

## 2018-12-30 ENCOUNTER — Non-Acute Institutional Stay (SKILLED_NURSING_FACILITY): Payer: Medicare Other | Admitting: Internal Medicine

## 2018-12-30 ENCOUNTER — Encounter: Payer: Self-pay | Admitting: Internal Medicine

## 2018-12-30 DIAGNOSIS — E119 Type 2 diabetes mellitus without complications: Secondary | ICD-10-CM | POA: Diagnosis not present

## 2018-12-30 DIAGNOSIS — E785 Hyperlipidemia, unspecified: Secondary | ICD-10-CM

## 2018-12-30 DIAGNOSIS — I10 Essential (primary) hypertension: Secondary | ICD-10-CM

## 2018-12-30 DIAGNOSIS — E1169 Type 2 diabetes mellitus with other specified complication: Secondary | ICD-10-CM

## 2018-12-30 NOTE — Progress Notes (Signed)
Location:  [place] Room Number: 366Y Place of Service:  SNF (31)  Jasmine Delaine. [name], MD  Patient Care Team: Jacklyn Shell, FNP as PCP - General (Nurse Practitioner) Marylynn Pearson, MD as Referring Physician (Internal Medicine)  Extended Emergency Contact Information Primary Emergency Contact: Macky [place] Phone: [phone] Mobile Phone: [phone] Relation: Son Secondary Emergency Contact: [place] Mobile Phone: [phone] Relation: Daughter    Allergies: Comtan [entacapone]; Mirapex [pramipexole dihydrochloride]; Sulfa antibiotics; Ciprofloxacin; and Seroquel [quetiapine fumarate]  Chief Complaint  Patient presents with  . Medical Management of Chronic Issues    Routine visit    HPI: Patient is 85 y.o. female who is being seen for routine issues of hyperlipidemia, diabetes mellitus type 2, and hypertension.  Past Medical History:  Diagnosis Date  . Abnormality of gait 02/05/2015  . Anxiety   . Arthritis   . Asthma   . Cerebrovascular disease    CVA '03  . Depression   . Dyslipidemia   . Endometriosis   . GERD (gastroesophageal reflux disease)   . Glaucoma   . Headache(784.0)   . Hypertension   . Macular degeneration    bilateral  . Memory disorder 11/23/2018  . Obesity   . Paralysis agitans ([place]) 08/31/2013  . Parkinson disease ([place])   . TIA (transient ischemic attack)    Multiple    Past Surgical History:  Procedure Laterality Date  . ABDOMINAL HYSTERECTOMY    . CATARACT EXTRACTION, BILATERAL    . GALLBLADDER SURGERY    . LOOP RECORDER IMPLANT  05-28-14   MDT LINQ implanted by Dr Caryl Comes for cryptogenic stroke/palpitations  . LOOP RECORDER IMPLANT N/A 05/28/2014   Procedure: LOOP RECORDER IMPLANT;  Surgeon: Deboraha Sprang, MD;  Location: [facility] CATH LAB;  Service: Cardiovascular;  Laterality: N/A;  . PILONIDAL CYST RESECTION    . TILT TABLE STUDY  05-28-14   orthostatic  hypertension  . TILT TABLE STUDY N/A 05/28/2014   Procedure: TILT TABLE STUDY;  Surgeon: Deboraha Sprang, MD;  [facility] LAB;  Service: Cardiovascular;  Laterality: N/A;  . TONSILLECTOMY      Allergies as of 12/30/2018      Reactions   Comtan [entacapone] Other (See Comments)   Reaction:  Unknown    Mirapex [pramipexole Dihydrochloride] Other (See Comments)   Reaction:  Unknown    Sulfa Antibiotics Nausea And Vomiting   Ciprofloxacin Other (See Comments)   Reaction:  Made pt feel crazy    Seroquel [quetiapine Fumarate]    Excessive sleepyness      Medication List       Accurate as of December 30, 2018 11:59 PM. Always use your most recent med list.        Acetaminophen 500 MG coapsule Take 500 mg by mouth 3 (three) times daily.   ALPRAZolam 0.5 MG tablet Commonly known as:  XANAX Take 1 tablet (0.5 mg total) by mouth 3 (three) times daily.   atorvastatin 10 MG tablet Commonly known as:  LIPITOR Take 10 mg by mouth at bedtime.   BIOFREEZE 4 % Gel Generic drug:  Menthol (Topical Analgesic) Apply 1 application topically 3 (three) times daily.   busPIRone 5 MG tablet Commonly known as:  BUSPAR Take 5 mg by mouth 2 (two) times daily.   carbidopa-levodopa 25-100 MG tablet Commonly known as:  SINEMET IR Take 1 tablet by mouth every 8 (eight) hours.   clopidogrel 75 MG tablet Commonly known  as:  PLAVIX Take 75 mg by mouth daily at 12 noon.   diltiazem 60 MG tablet Commonly known as:  CARDIZEM Take 1 tablet (60 mg total) by mouth every 6 (six) hours.   DULoxetine 60 MG capsule Commonly known as:  CYMBALTA Take 60 mg by mouth daily.   escitalopram 5 MG tablet Commonly known as:  LEXAPRO Take 5 mg by mouth daily.   glucose blood test strip 1 each by Other route as needed for other ([place]). Use as instructed   levalbuterol 0.63 MG/3ML nebulizer solution Commonly known as:  XOPENEX Take 0.63 mg by nebulization  every 6 (six) hours as needed for wheezing or shortness of breath.   losartan 25 MG tablet Commonly known as:  COZAAR Take 25 mg by mouth at bedtime.   Melatonin 3 MG Caps Take 3 mg by mouth at bedtime.   metFORMIN 500 MG tablet Commonly known as:  GLUCOPHAGE Take 500 mg by mouth 2 (two) times daily before a meal.   montelukast 10 MG tablet Commonly known as:  SINGULAIR Take 10 mg by mouth daily at 12 noon.   oxymetazoline 0.05 % nasal spray Commonly known as:  AFRIN Place 1 spray into both nostrils as needed (for nose bleeds).   pantoprazole sodium 40 mg/20 mL Pack Commonly known as:  PROTONIX Take 20 mLs (40 mg total) by mouth daily.   TAB-A-VITE Tabs Take 1 tablet by mouth daily at 12 noon.   Travoprost (BAK Free) 0.004 % Soln ophthalmic solution Commonly known as:  TRAVATAN Place 1 drop into both eyes at bedtime.   VENTOLIN HFA 108 (90 Base) MCG/ACT inhaler Generic drug:  albuterol Inhale 2 puffs into the lungs every 6 (six) hours as needed for wheezing or shortness of breath.       No orders of the defined types were placed in this encounter.   Immunization History  Administered Date(s) Administered  . Influenza Split 07/08/2018    Social History   Tobacco Use  . Smoking status: Never Smoker  . Smokeless tobacco: Never Used  Substance Use Topics  . Alcohol use: No    Review of Systems  DATA OBTAINED: from patient, nurse GENERAL:  no fevers, fatigue, appetite changes SKIN: No itching, rash HEENT: No complaint RESPIRATORY: No cough, wheezing, SOB CARDIAC: No chest pain, palpitations, lower extremity edema  GI: No abdominal pain, No N/V/D or constipation, No heartburn or reflux  GU: No dysuria, frequency or urgency, or incontinence  MUSCULOSKELETAL: No unrelieved bone/joint pain NEUROLOGIC: No headache, dizziness  PSYCHIATRIC: No overt anxiety or sadness  Vitals:   12/30/18 0850  BP: (!) 141/79  Pulse: 87  Resp: 20  Temp: 98.4 F (36.9 C)     Body mass index is 36.4 kg/m. Physical Exam  GENERAL APPEARANCE: Alert, conversant, No acute distress  SKIN: No diaphoresis rash HEENT: Unremarkable RESPIRATORY: Breathing is even, unlabored. Lung sounds are clear   CARDIOVASCULAR: Heart RRR no murmurs, rubs or gallops. No peripheral edema  GASTROINTESTINAL: Abdomen is soft, non-tender, not distended w/ normal bowel sounds.  GENITOURINARY: Bladder non tender, not distended  MUSCULOSKELETAL: No abnormal joints or musculature NEUROLOGIC: Cranial nerves 2-12 grossly intact. Moves all extremities PSYCHIATRIC: Mood and affect appropriate to situation, patient displays intermittent psychosis  Patient Active Problem List   Diagnosis Date Noted  . Dysphagia 12/11/2018  . Simple chronic bronchitis ([place]) 12/11/2018  . Insomnia 12/02/2018  . Memory disorder 11/23/2018  . GERD (gastroesophageal reflux disease)  09/05/2018  . Diabetes mellitus type II, controlled ([place]) 07/03/2018  . Depression 07/03/2018  . Essential hypertension   . Hx of aspiration pneumonitis 06/17/2018  . Abnormality of gait 02/05/2015  . Palpitations 05/28/2014  . Cerebrovascular disease 05/25/2014  . Hyperlipidemia associated with type 2 diabetes mellitus ([place]) 05/25/2014  . Parkinson disease ([place]) 05/25/2014  . H/O: CVA (cerebrovascular accident) 05/25/2014  . Paralysis agitans ([place]) 08/31/2013  . Dizziness and giddiness 08/31/2013    CMP     Component Value Date/Time   NA 143 08/15/2018   K 4.0 08/15/2018   CL 101 06/20/2018 0541   CO2 29 06/20/2018 0541   GLUCOSE 109 (H) 06/20/2018 0541   BUN 24 (A) 08/15/2018   CREATININE 0.7 08/15/2018   CREATININE 0.79 06/20/2018 0541   CALCIUM 10.2 06/20/2018 0541   PROT 7.4 06/17/2018 1554   ALBUMIN 3.9 06/17/2018 1554   AST 22 08/08/2018   ALT 26 08/08/2018   ALKPHOS 91 08/08/2018   BILITOT 0.8 06/17/2018 1554   GFRNONAA >60 06/20/2018 0541   GFRAA >60 06/20/2018 0541   Recent Labs    06/18/18 0528  06/19/18 0524 06/20/18 0541 08/08/18 08/15/18  NA 140 141 140 142 143  K 3.1* 3.6 3.7 4.0 4.0  CL 103 102 101  --   --   CO2 30 30 29   --   --   GLUCOSE 114* 123* 109*  --   --   BUN 21 20 19 20  24*  CREATININE 0.74 0.80 0.79 0.7 0.7  CALCIUM 9.9 10.0 10.2  --   --    Recent Labs    06/17/18 1554 08/08/18  AST 34 22  ALT 26 26  ALKPHOS 71 91  BILITOT 0.8  --   PROT 7.4  --   ALBUMIN 3.9  --    Recent Labs    06/17/18 1554 06/18/18 0528 06/19/18 0524 06/20/18 0541 08/08/18  WBC 13.7* 10.0 7.3 7.3 8.5  8.5  NEUTROABS 10.7*  --  3.8 3.5  --   HGB 12.9 11.2* 11.3* 12.2 11.4*  11.4*  HCT 39.9 34.9* 35.3* 38.0 34*  34*  MCV 93.0 92.1 92.4 92.9  --   PLT 315 314 330 373 354  354   Recent Labs    08/08/18  CHOL 167  LDLCALC 71  TRIG 277*   No results found for: [facility] Lab Results  Component Value Date   TSH 3.09 08/08/2018   Lab Results  Component Value Date   HGBA1C 6.2 (A) 08/08/2018   Lab Results  Component Value Date   CHOL 167 08/08/2018   HDL 41 08/08/2018   LDLCALC 71 08/08/2018   TRIG 277 (A) 08/08/2018   CHOLHDL 5.3 05/26/2014    Significant Diagnostic Results in last 30 days:  No results found.  Assessment and Plan  Hyperlipidemia associated with type 2 diabetes mellitus ([place]) Good control; continue Lipitor 10 mg daily  Diabetes mellitus type II, controlled ([place]) Good control with most recent hemoglobin A1c 5.6; continue Glucophage 500 mg twice daily; patient is on ARB, patient is on statin  Essential hypertension Controlled; continue diltiazem 60 mg every 6 and Cozaar 25 mg nightly     [name] D. [name], MD

## 2019-01-01 ENCOUNTER — Encounter: Payer: Self-pay | Admitting: Internal Medicine

## 2019-01-01 NOTE — Assessment & Plan Note (Signed)
Good control; continue Lipitor 10 mg daily

## 2019-01-01 NOTE — Assessment & Plan Note (Signed)
Good control with most recent hemoglobin A1c 5.6; continue Glucophage 500 mg twice daily; patient is on ARB, patient is on statin

## 2019-01-01 NOTE — Assessment & Plan Note (Signed)
Controlled; continue diltiazem 60 mg every 6 and Cozaar 25 mg nightly

## 2019-01-12 ENCOUNTER — Other Ambulatory Visit: Payer: Self-pay

## 2019-01-16 MED ORDER — ALPRAZOLAM 0.5 MG PO TABS: 0.5000 mg | ORAL_TABLET | Freq: Three times a day (TID) | ORAL | 2 refills | Status: DC

## 2019-01-25 ENCOUNTER — Encounter: Payer: Self-pay | Admitting: Adult Health

## 2019-01-25 ENCOUNTER — Non-Acute Institutional Stay (SKILLED_NURSING_FACILITY): Payer: Medicare Other | Admitting: Adult Health

## 2019-01-25 DIAGNOSIS — I679 Cerebrovascular disease, unspecified: Secondary | ICD-10-CM | POA: Diagnosis not present

## 2019-01-25 DIAGNOSIS — F339 Major depressive disorder, recurrent, unspecified: Secondary | ICD-10-CM

## 2019-01-25 DIAGNOSIS — H40053 Ocular hypertension, bilateral: Secondary | ICD-10-CM

## 2019-01-25 DIAGNOSIS — E1169 Type 2 diabetes mellitus with other specified complication: Secondary | ICD-10-CM

## 2019-01-25 DIAGNOSIS — R52 Pain, unspecified: Secondary | ICD-10-CM

## 2019-01-25 DIAGNOSIS — J410 Simple chronic bronchitis: Secondary | ICD-10-CM

## 2019-01-25 DIAGNOSIS — G8929 Other chronic pain: Secondary | ICD-10-CM

## 2019-01-25 DIAGNOSIS — G20 Parkinson's disease: Secondary | ICD-10-CM

## 2019-01-25 DIAGNOSIS — K219 Gastro-esophageal reflux disease without esophagitis: Secondary | ICD-10-CM

## 2019-01-25 DIAGNOSIS — E1149 Type 2 diabetes mellitus with other diabetic neurological complication: Secondary | ICD-10-CM

## 2019-01-25 DIAGNOSIS — E785 Hyperlipidemia, unspecified: Secondary | ICD-10-CM

## 2019-01-25 NOTE — Progress Notes (Signed)
Location:    [facility] Room Number: 941D Place of Service:  SNF (31)   CODE STATUS: full code   Allergies  Allergen Reactions  . Comtan [Entacapone] Other (See Comments)    Reaction:  Unknown   . Mirapex [Pramipexole Dihydrochloride] Other (See Comments)    Reaction:  Unknown   . Sulfa Antibiotics Nausea And Vomiting  . Ciprofloxacin Other (See Comments)    Reaction:  Made pt feel crazy   . Seroquel [Quetiapine Fumarate]     Excessive sleepyness    Chief Complaint  Patient presents with  . Medical Management of Chronic Issues    Cerebrovascular disease; simple chronic bronchitis; gastroesophageal reflux disease without esophagitis.     HPI:  She is a 85 year old long term resident of this facility being seen for the management of her chronic illnesses: cva; bronchitis; gerd. She denies any uncontrolled pain; no changes in appetite; no anxiety or depressive thoughts.   Past Medical History:  Diagnosis Date  . Abnormality of gait 02/05/2015  . Anxiety   . Arthritis   . Asthma   . Cerebrovascular disease    CVA '03  . Depression   . Dyslipidemia   . Endometriosis   . GERD (gastroesophageal reflux disease)   . Glaucoma   . Headache(784.0)   . Hypertension   . Macular degeneration    bilateral  . Memory disorder 11/23/2018  . Obesity   . Paralysis agitans ([place]) 08/31/2013  . Parkinson disease ([place])   . TIA (transient ischemic attack)    Multiple    Past Surgical History:  Procedure Laterality Date  . ABDOMINAL HYSTERECTOMY    . CATARACT EXTRACTION, BILATERAL    . GALLBLADDER SURGERY    . LOOP RECORDER IMPLANT  05-28-14   MDT LINQ implanted by Dr Caryl Comes for cryptogenic stroke/palpitations  . LOOP RECORDER IMPLANT N/A 05/28/2014   Procedure: LOOP RECORDER IMPLANT;  Surgeon: Deboraha Sprang, MD;  Location: [facility] CATH LAB;  Service: Cardiovascular;  Laterality: N/A;  . PILONIDAL CYST RESECTION    . TILT TABLE STUDY  05-28-14   orthostatic hypertension  . TILT TABLE STUDY N/A 05/28/2014   Procedure: TILT TABLE STUDY;  Surgeon: Deboraha Sprang, MD;  Location: [facility] CATH LAB;  Service: Cardiovascular;  Laterality: N/A;  . TONSILLECTOMY      Social History   Socioeconomic History  . Marital status: Married    Spouse name: Not on file  . Number of children: 4  . Years of education: HS  . Highest education level: Not on file  Occupational History  . Occupation: Retired  Scientific laboratory technician  . Financial resource strain: Not hard at all  . Food insecurity:    Worry: Never true    Inability: Never true  . Transportation needs:    Medical: No    Non-medical: No  Tobacco Use  . Smoking status: Never Smoker  . Smokeless tobacco: Never Used  Substance and Sexual Activity  . Alcohol use: No  . Drug use: No  . Sexual activity: Never    Birth control/protection: Other-see comments  Lifestyle  . Physical activity:    Days per week: 0 days    Minutes per session: 0 min  . Stress: Not on file  Relationships  . Social connections:    Talks on phone: Not on file    Gets together: Not on file    Attends religious service: Not on file  Active member of club or organization: Not on file    Attends meetings of clubs or organizations: Not on file    Relationship status: Not on file  . Intimate partner violence:    Fear of current or ex partner: Not on file    Emotionally abused: Not on file    Physically abused: Not on file    Forced sexual activity: Not on file  Other Topics Concern  . Not on file  Social History Narrative   Lives at [facility]   Patient is right handed.   Caffeine use: Tea daily   Family History  Problem Relation Age of Onset  . Heart attack Mother   . Arthritis Brother   . Skin cancer Brother       VITAL SIGNS BP 134/76   Pulse 86   Temp 97.9 F (36.6 C)   Resp 18   Ht 5' (1.524 m)   Wt 183 lb (83 kg)   BMI 35.74 kg/m   Outpatient Encounter Medications as of 01/25/2019   Medication Sig  . Acetaminophen 500 MG coapsule Take 500 mg by mouth 3 (three) times daily.   Marland Kitchen albuterol (VENTOLIN HFA) 108 (90 Base) MCG/ACT inhaler Inhale 2 puffs into the lungs every 6 (six) hours as needed for wheezing or shortness of breath.  . ALPRAZolam (XANAX) 0.5 MG tablet Take 1 tablet (0.5 mg total) by mouth 3 (three) times daily.  Marland Kitchen atorvastatin (LIPITOR) 10 MG tablet Take 10 mg by mouth at bedtime.   . bisacodyl (DULCOLAX) 10 MG suppository Place 10 mg rectally as needed for moderate constipation.  . busPIRone (BUSPAR) 5 MG tablet Take 5 mg by mouth 2 (two) times daily.  . carbidopa-levodopa (SINEMET IR) 25-100 MG tablet Take 1 tablet by mouth every 8 (eight) hours.  . clopidogrel (PLAVIX) 75 MG tablet Take 75 mg by mouth daily at 12 noon.   . diltiazem (CARDIZEM) 60 MG tablet Take 1 tablet (60 mg total) by mouth every 6 (six) hours.  . DULoxetine (CYMBALTA) 60 MG capsule Take 60 mg by mouth daily.  Marland Kitchen escitalopram (LEXAPRO) 5 MG tablet Take 5 mg by mouth daily.  Marland Kitchen glucose blood test strip 1 each by Other route as needed for other (Page). Use as instructed  . levalbuterol (XOPENEX) 0.63 MG/3ML nebulizer solution Take 0.63 mg by nebulization every 6 (six) hours as needed for wheezing or shortness of breath.  . losartan (COZAAR) 25 MG tablet Take 25 mg by mouth at bedtime.   . magnesium hydroxide (MILK OF MAGNESIA) 400 MG/5ML suspension Take by mouth daily as needed for mild constipation.  . Melatonin 3 MG CAPS Take 3 mg by mouth at bedtime.  . Menthol, Topical Analgesic, (BIOFREEZE) 4 % GEL Apply 1 application topically 3 (three) times daily.  . metFORMIN (GLUCOPHAGE) 500 MG tablet Take 500 mg by mouth 2 (two) times daily before a meal.   . montelukast (SINGULAIR) 10 MG tablet Take 10 mg by mouth daily at 12 noon.   . Multiple Vitamin (TAB-A-VITE) TABS Take 1 tablet by mouth daily at 12 noon.   Marland Kitchen oxymetazoline (AFRIN) 0.05 % nasal spray  Place 1 spray into both nostrils as needed (for nose bleeds).  . pantoprazole sodium (PROTONIX) 40 mg/20 mL PACK Take 20 mLs (40 mg total) by mouth daily.  . Travoprost, BAK Free, (TRAVATAN) 0.004 % SOLN ophthalmic solution Place 1 drop into both eyes at bedtime.   No facility-administered encounter  medications on file as of 01/25/2019.      SIGNIFICANT DIAGNOSTIC EXAMS  LABS REVIEWED TODAY:   08-08-18 : wbc 8.5; hgb 11.4; hct 34; plt 354  glucose 104; bun 23; creat 0.70; k+ 4.0; na++ 143; ca 10.6 liver normal  chol 167; ldl 71; trig 277; hdl 41; vit D >60; tsh 3.09; hgb a1c 6.2   Review of Systems  Constitutional: Negative for malaise/fatigue.  Respiratory: Negative for cough and shortness of breath.   Cardiovascular: Negative for chest pain, palpitations and leg swelling.  Gastrointestinal: Negative for abdominal pain, constipation and heartburn.  Musculoskeletal: Negative for back pain, joint pain and myalgias.  Skin: Negative.   Neurological: Negative for dizziness.  Psychiatric/Behavioral: The patient is not nervous/anxious.     Physical Exam Constitutional:      General: She is not in acute distress.    Appearance: She is well-developed. She is obese. She is not diaphoretic.  Neck:     Musculoskeletal: Neck supple.     Thyroid: No thyromegaly.  Cardiovascular:     Rate and Rhythm: Normal rate and regular rhythm.     Pulses: Normal pulses.     Heart sounds: Normal heart sounds.  Pulmonary:     Effort: Pulmonary effort is normal. No respiratory distress.     Breath sounds: Normal breath sounds.  Abdominal:     General: Bowel sounds are normal. There is no distension.     Palpations: Abdomen is soft.     Tenderness: There is no abdominal tenderness.  Musculoskeletal:     Right lower leg: No edema.     Left lower leg: No edema.     Comments: Is able to move all extremities   Lymphadenopathy:     Cervical: No cervical adenopathy.  Skin:    General: Skin is warm and  dry.  Neurological:     Mental Status: She is alert. Mental status is at baseline.  Psychiatric:        Mood and Affect: Mood normal.      ASSESSMENT/ PLAN:  TODAY;  1. Essential hypertension: 134/76; will continue cozaar 25 mg daily cardizem 60 mg every 6 hours   2. Cerebrovascular disease; is neurologically stable: will continue plavix 75 mg daily   3. Simple chronic bronchitis: is stable will continue singulair 10 mg daily albuterol 2 puffs every 6 hours as needed   4. GERD without esophagitis: is stable will continue protonix 40 mg daily   5. Hyperlipidemia associated with type 2 diabetes mellitus: is stable ldl 71 will continue lipitor 10 mg daily   6.  Type 2 diabetes mellitus with neurological manifestations controlled: is stable hgb a1c 6.2 will continue metformin 500 mg twice daily   7. Parkinson's disease: is without change: will continue sinemet 25/100 mg every 8 hours the nuplazid has been stopped  8. Chronic generalized pain; is stable will continue tylenol 500 gm three times daily   9.  Chronic recurrent major depressive disorder: : is stable will continue cymbalta 60 mg daily xanax 0.5 mg three times daily and buspar 5 mg twice daily melatonin 3 mg nightly for sleep    10. Increased intraocular pressure: is stable will continue travatan to both eyes nightly        MD is aware of resident's narcotic use and is in agreement with current plan of care. We will attempt to wean resident as apropriate   Ok Edwards NP [facility]  Contact [phone] Monday through Friday 8am-  5pm  After hours call [phone]

## 2019-01-29 DIAGNOSIS — R52 Pain, unspecified: Secondary | ICD-10-CM

## 2019-01-29 DIAGNOSIS — G8929 Other chronic pain: Secondary | ICD-10-CM | POA: Insufficient documentation

## 2019-01-29 DIAGNOSIS — H40053 Ocular hypertension, bilateral: Secondary | ICD-10-CM | POA: Insufficient documentation

## 2019-01-29 DIAGNOSIS — F339 Major depressive disorder, recurrent, unspecified: Secondary | ICD-10-CM | POA: Insufficient documentation

## 2019-01-31 ENCOUNTER — Other Ambulatory Visit: Payer: Self-pay | Admitting: Adult Health

## 2019-02-16 ENCOUNTER — Encounter: Payer: Self-pay | Admitting: Internal Medicine

## 2019-02-16 ENCOUNTER — Non-Acute Institutional Stay (SKILLED_NURSING_FACILITY): Payer: Medicare Other | Admitting: Internal Medicine

## 2019-02-16 DIAGNOSIS — F02818 Dementia in other diseases classified elsewhere, unspecified severity, with other behavioral disturbance: Secondary | ICD-10-CM

## 2019-02-16 DIAGNOSIS — F0281 Dementia in other diseases classified elsewhere with behavioral disturbance: Secondary | ICD-10-CM

## 2019-02-16 DIAGNOSIS — G20 Parkinson's disease: Secondary | ICD-10-CM | POA: Diagnosis not present

## 2019-02-16 MED ORDER — ALPRAZOLAM 0.5 MG PO TABS: 0.5000 mg | ORAL_TABLET | Freq: Three times a day (TID) | ORAL | 2 refills | Status: DC

## 2019-02-16 NOTE — Progress Notes (Signed)
Location:  [facility] Room Number: 295J Place of Service:  SNF [phone]) Provider: Hennie Duos MD   Patient Care Team: Jacklyn Shell, FNP as PCP - General (Nurse Practitioner) Marylynn Pearson, MD as Referring Physician (Internal Medicine)  Extended Emergency Contact Information Primary Emergency Contact: Macky [place] Phone: [phone] Mobile Phone: [phone] Relation: Son Secondary Emergency Contact: [place] Mobile Phone: [phone] Relation: Daughter    Allergies: Comtan [entacapone]; Mirapex [pramipexole dihydrochloride]; Sulfa antibiotics; Ciprofloxacin; and Seroquel [quetiapine fumarate]  Chief Complaint  Patient presents with  . Acute Visit    HPI: Patient is 85 y.o. female whoIs being seen today for behaviors.  Patient has been telling staff that she needs an extra bed made up for her husband who she sees nightly and she recounts many other things that she has seen.  She has been calling her daughter in the middle the night talking about what she seen.  Nursing and patient's daughter would like for me to review patient's medications to see if we can change anything to improve the situation.  Patient herself is in no acute distress but does have delusions.  Patient's husband has been dead for several years.  Past Medical History:  Diagnosis Date  . Abnormality of gait 02/05/2015  . Anxiety   . Arthritis   . Asthma   . Cerebrovascular disease    CVA '03  . Depression   . Dyslipidemia   . Endometriosis   . GERD (gastroesophageal reflux disease)   . Glaucoma   . Headache(784.0)   . Hypertension   . Macular degeneration    bilateral  . Memory disorder 11/23/2018  . Obesity   . Paralysis agitans ([place]) 08/31/2013  . Parkinson disease ([place])   . TIA (transient ischemic attack)    Multiple    Past Surgical History:  Procedure Laterality Date  . ABDOMINAL HYSTERECTOMY    . CATARACT  EXTRACTION, BILATERAL    . GALLBLADDER SURGERY    . LOOP RECORDER IMPLANT  05-28-14   MDT LINQ implanted by Dr Caryl Comes for cryptogenic stroke/palpitations  . LOOP RECORDER IMPLANT N/A 05/28/2014   Procedure: LOOP RECORDER IMPLANT;  Surgeon: Deboraha Sprang, MD;  Location: [facility] CATH LAB;  Service: Cardiovascular;  Laterality: N/A;  . PILONIDAL CYST RESECTION    . TILT TABLE STUDY  05-28-14   orthostatic hypertension  . TILT TABLE STUDY N/A 05/28/2014   Procedure: TILT TABLE STUDY;  Surgeon: Deboraha Sprang, MD;  Location: [facility] CATH LAB;  Service: Cardiovascular;  Laterality: N/A;  . TONSILLECTOMY      Allergies as of 02/16/2019      Reactions   Comtan [entacapone] Other (See Comments)   Reaction:  Unknown    Mirapex [pramipexole Dihydrochloride] Other (See Comments)   Reaction:  Unknown    Sulfa Antibiotics Nausea And Vomiting   Ciprofloxacin Other (See Comments)   Reaction:  Made pt feel crazy    Seroquel [quetiapine Fumarate]    Excessive sleepyness      Medication List       Accurate as of February 16, 2019 11:59 PM. Always use your most recent med list.        Acetaminophen 500 MG coapsule Take 500 mg by mouth 3 (three) times daily.   ALPRAZolam 0.5 MG tablet Commonly known as:  XANAX Take 1 tablet (0.5 mg total) by mouth 3 (three) times daily.   atorvastatin 10 MG tablet Commonly known as:  LIPITOR  Take 10 mg by mouth at bedtime.   Biofreeze 4 % Gel Generic drug:  Menthol (Topical Analgesic) Apply 1 application topically 3 (three) times daily.   bisacodyl 10 MG suppository Commonly known as:  DULCOLAX Place 10 mg rectally as needed for moderate constipation.   busPIRone 5 MG tablet Commonly known as:  BUSPAR Take 5 mg by mouth 2 (two) times daily.   carbidopa-levodopa 25-100 MG tablet Commonly known as:  SINEMET IR Take 1 tablet by mouth every 8 (eight) hours.   clopidogrel 75 MG tablet Commonly known as:  PLAVIX Take 75 mg by mouth daily at 12 noon.   diltiazem  60 MG tablet Commonly known as:  CARDIZEM Take 1 tablet (60 mg total) by mouth every 6 (six) hours.   DULoxetine 60 MG capsule Commonly known as:  CYMBALTA Take 60 mg by mouth daily.   escitalopram 5 MG tablet Commonly known as:  LEXAPRO Take 5 mg by mouth daily.   levalbuterol 0.63 MG/3ML nebulizer solution Commonly known as:  XOPENEX Take 0.63 mg by nebulization. INHALE 1 VIAL VIA NEBULIZER EVERY 6 HOURS AS NEEDED FOR WHEEZING/SOB   losartan 25 MG tablet Commonly known as:  COZAAR Take 25 mg by mouth at bedtime.   magnesium hydroxide 400 MG/5ML suspension Commonly known as:  MILK OF MAGNESIA Take by mouth daily as needed for mild constipation.   Melatonin 3 MG Caps Take 3 mg by mouth at bedtime.   metFORMIN 500 MG tablet Commonly known as:  GLUCOPHAGE Take 500 mg by mouth 2 (two) times daily before a meal.   montelukast 10 MG tablet Commonly known as:  SINGULAIR Take 10 mg by mouth daily at 12 noon.   oxymetazoline 0.05 % nasal spray Commonly known as:  AFRIN Place 1 spray into both nostrils as needed (for nose bleeds).   pantoprazole sodium 40 mg/20 mL Pack Commonly known as:  PROTONIX Take 20 mLs (40 mg total) by mouth daily.   Tab-A-Vite Tabs Take 1 tablet by mouth daily at 12 noon.   Travoprost (BAK Free) 0.004 % Soln ophthalmic solution Commonly known as:  TRAVATAN Place 1 drop into both eyes at bedtime.   Ventolin HFA 108 (90 Base) MCG/ACT inhaler Generic drug:  albuterol Inhale 2 puffs into the lungs every 6 (six) hours as needed for wheezing or shortness of breath.       Meds ordered this encounter  Medications  . ALPRAZolam (XANAX) 0.5 MG tablet    Sig: Take 1 tablet (0.5 mg total) by mouth 3 (three) times daily.    Dispense:  180 tablet    Refill:  2    Immunization History  Administered Date(s) Administered  . Influenza [place] 07/08/2018    Social History   Tobacco Use  . Smoking status: Never Smoker  . Smokeless tobacco: Never  Used  Substance Use Topics  . Alcohol use: No    Review of Systems  DATA OBTAINED: from patient, nurse GENERAL:  no fevers, fatigue, appetite changes SKIN: No itching, rash HEENT: No complaint RESPIRATORY: No cough, wheezing, SOB CARDIAC: No chest pain, palpitations, lower extremity edema  GI: No abdominal pain, No N/V/D or constipation, No heartburn or reflux  GU: No dysuria, frequency or urgency, or incontinence  MUSCULOSKELETAL: No unrelieved bone/joint pain NEUROLOGIC: No headache, dizziness  PSYCHIATRIC: No overt anxiety or sadness, just people and things that she sees  Vitals:   02/16/19 1559  BP: (!) 148/86  Pulse: 70  Resp: 18  Temp: (!)  97.3 F (36.3 C)   Body mass index is 35.51 kg/m. Physical Exam  GENERAL APPEARANCE: Alert, conversant, No acute distress  SKIN: No diaphoresis rash HEENT: Unremarkable RESPIRATORY: Breathing is even, unlabored. Lung sounds are clear   CARDIOVASCULAR: Heart RRR no murmurs, rubs or gallops. No peripheral edema  GASTROINTESTINAL: Abdomen is soft, non-tender, not distended w/ normal bowel sounds.  GENITOURINARY: Bladder non tender, not distended  MUSCULOSKELETAL: No abnormal joints or musculature NEUROLOGIC: Cranial nerves 2-12 grossly intact. Moves all extremities PSYCHIATRIC: Mood and affect appropriate to situation with dementia, at times patient can be reoriented to the fact that her husband is dead but then 15 to 20 minutes later she forgets that again  Patient Active Problem List   Diagnosis Date Noted  . Chronic generalized pain 01/29/2019  . Chronic recurrent major depressive disorder ([place]) 01/29/2019  . Increased intraocular pressure, bilateral 01/29/2019  . Dysphagia 12/11/2018  . Simple chronic bronchitis ([place]) 12/11/2018  . Insomnia 12/02/2018  . Memory disorder 11/23/2018  . GERD (gastroesophageal reflux disease) 09/05/2018  . Type 2 diabetes mellitus with neurological manifestations, controlled ([place]) 07/03/2018   . Depression 07/03/2018  . Essential hypertension   . Hx of aspiration pneumonitis 06/17/2018  . Abnormality of gait 02/05/2015  . Palpitations 05/28/2014  . Cerebrovascular disease 05/25/2014  . Hyperlipidemia associated with type 2 diabetes mellitus ([place]) 05/25/2014  . Parkinson disease ([place]) 05/25/2014  . H/O: CVA (cerebrovascular accident) 05/25/2014  . Paralysis agitans ([place]) 08/31/2013  . Dizziness and giddiness 08/31/2013    CMP     Component Value Date/Time   NA 143 08/15/2018   K 4.0 08/15/2018   CL 101 06/20/2018 0541   CO2 29 06/20/2018 0541   GLUCOSE 109 (H) 06/20/2018 0541   BUN 24 (A) 08/15/2018   CREATININE 0.7 08/15/2018   CREATININE 0.79 06/20/2018 0541   CALCIUM 10.2 06/20/2018 0541   PROT 7.4 06/17/2018 1554   ALBUMIN 3.9 06/17/2018 1554   AST 22 08/08/2018   ALT 26 08/08/2018   ALKPHOS 91 08/08/2018   BILITOT 0.8 06/17/2018 1554   GFRNONAA >60 06/20/2018 0541   GFRAA >60 06/20/2018 0541   Recent Labs    06/18/18 0528 06/19/18 0524 06/20/18 0541 08/08/18 08/15/18  NA 140 141 140 142 143  K 3.1* 3.6 3.7 4.0 4.0  CL 103 102 101  --   --   CO2 30 30 29   --   --   GLUCOSE 114* 123* 109*  --   --   BUN 21 20 19 20  24*  CREATININE 0.74 0.80 0.79 0.7 0.7  CALCIUM 9.9 10.0 10.2  --   --    Recent Labs    06/17/18 1554 08/08/18  AST 34 22  ALT 26 26  ALKPHOS 71 91  BILITOT 0.8  --   PROT 7.4  --   ALBUMIN 3.9  --    Recent Labs    06/17/18 1554 06/18/18 0528 06/19/18 0524 06/20/18 0541 08/08/18  WBC 13.7* 10.0 7.3 7.3 8.5  8.5  NEUTROABS 10.7*  --  3.8 3.5  --   HGB 12.9 11.2* 11.3* 12.2 11.4*  11.4*  HCT 39.9 34.9* 35.3* 38.0 34*  34*  MCV 93.0 92.1 92.4 92.9  --   PLT 315 314 330 373 354  354   Recent Labs    08/08/18  CHOL 167  LDLCALC 71  TRIG 277*   No results found for: MICROALBUR Lab Results  Component Value Date   TSH 3.09  08/08/2018   Lab Results  Component Value Date   HGBA1C 6.2 (A) 08/08/2018   Lab  Results  Component Value Date   CHOL 167 08/08/2018   HDL 41 08/08/2018   LDLCALC 71 08/08/2018   TRIG 277 (A) 08/08/2018   CHOLHDL 5.3 05/26/2014    Significant Diagnostic Results in last 30 days:  No results found.  Assessment and Plan  Dementia with behaviors- patient is on Xanax 0.5 mg 3 times daily, which does not make her sleepy and she is on BuSpar 6 5 mg twice daily.  Patient is BuSpar can be increased to 7.5 mg twice daily with no downside as it rarely makes people sleepy.  Patient has been tried on Seroquel and it makes her excessively sleepy for several days.  Neurology has placed her on a new antipsychotic but the co-pay is $500 which is too much for the family to bear.  Therefore we will start a trial of Depakote sprinkles 125 mg twice daily, starting with low-dose secondary to the history of antipsychotics making patient so sleepy.  I did speak with patient's daughter Margarita Grizzle who was very open to the idea of taking her mother's phone away in the evening and returning it the next morning as well as to proposed med changes.  We did discuss how very difficult it is to deal with a parent to is having delusions and hallucinations as well as dementia.;  Will monitor her responses.    Time spent greater than 35 minutes;> 50% of time with patient was spent reviewing records, labs, tests and studies, counseling and developing plan of care  Inocencio Homes, MD

## 2019-02-18 ENCOUNTER — Encounter: Payer: Self-pay | Admitting: Internal Medicine

## 2019-02-18 DIAGNOSIS — F0391 Unspecified dementia with behavioral disturbance: Secondary | ICD-10-CM | POA: Insufficient documentation

## 2019-02-18 DIAGNOSIS — F03918 Unspecified dementia, unspecified severity, with other behavioral disturbance: Secondary | ICD-10-CM | POA: Insufficient documentation

## 2019-02-27 ENCOUNTER — Non-Acute Institutional Stay (SKILLED_NURSING_FACILITY): Payer: Medicare Other | Admitting: Internal Medicine

## 2019-02-27 ENCOUNTER — Encounter: Payer: Self-pay | Admitting: Internal Medicine

## 2019-02-27 DIAGNOSIS — G20 Parkinson's disease: Secondary | ICD-10-CM | POA: Diagnosis not present

## 2019-02-27 DIAGNOSIS — F0281 Dementia in other diseases classified elsewhere with behavioral disturbance: Secondary | ICD-10-CM

## 2019-02-27 DIAGNOSIS — G20A1 Parkinson's disease without dyskinesia, without mention of fluctuations: Secondary | ICD-10-CM

## 2019-02-27 DIAGNOSIS — K219 Gastro-esophageal reflux disease without esophagitis: Secondary | ICD-10-CM

## 2019-02-27 NOTE — Progress Notes (Signed)
Location:  [place] Room Number: [place]:  SNF (31)  [name], MD  Patient Care Team: Jacklyn Shell, FNP as PCP - General (Nurse Practitioner) Marylynn Pearson, MD as Referring Physician (Internal Medicine)  Extended Emergency Contact Information Primary Emergency Contact: Mack[place] Phone: [phone] Mobile Phone: [phone] Relation: Son Secondary Emergency Contact: [place] Mobile Phone: [phone] Relation: Daughter    Allergies: Comtan [entacapone]; Mirapex [pramipexole dihydrochloride]; Sulfa antibiotics; Ciprofloxacin; and Seroquel [quetiapine fumarate]  Chief Complaint  Patient presents with  . Medical Management of Chronic Issues    Routine Visit    HPI: Patient is 85 y.o. female who who is being seen for routine issues of GERD, Parkinson's, and dementia.  Past Medical History:  Diagnosis Date  . Abnormality of gait 02/05/2015  . Anxiety   . Arthritis   . Asthma   . Cerebrovascular disease    CVA '03  . Depression   . Dyslipidemia   . Endometriosis   . GERD (gastroesophageal reflux disease)   . Glaucoma   . Headache(784.0)   . Hypertension   . Macular degeneration    bilateral  . Memory disorder 11/23/2018  . Obesity   . Paralysis agitans ([place]) 08/31/2013  . Parkinson disease ([place])   . TIA (transient ischemic attack)    Multiple    Past Surgical History:  Procedure Laterality Date  . ABDOMINAL HYSTERECTOMY    . CATARACT EXTRACTION, BILATERAL    . GALLBLADDER SURGERY    . LOOP RECORDER IMPLANT  05-28-14   MDT LINQ implanted by Dr Caryl Comes for cryptogenic stroke/palpitations  . LOOP RECORDER IMPLANT N/A 05/28/2014   Procedure: LOOP RECORDER IMPLANT;  Surgeon: Deboraha Sprang, MD;  Location: [facility] CATH LAB;  Service: Cardiovascular;  Laterality: N/A;  . PILONIDAL CYST RESECTION    . TILT TABLE STUDY  05-28-14   orthostatic hypertension  . TILT TABLE  STUDY N/A 05/28/2014   Procedure: TILT TABLE STUDY;  Surgeon: Deboraha Sprang, MD;  Location: [facility] CATH LAB;  Service: Cardiovascular;  Laterality: N/A;  . TONSILLECTOMY      Allergies as of 02/27/2019      Reactions   Comtan [entacapone] Other (See Comments)   Reaction:  Unknown    Mirapex [pramipexole Dihydrochloride] Other (See Comments)   Reaction:  Unknown    Sulfa Antibiotics Nausea And Vomiting   Ciprofloxacin Other (See Comments)   Reaction:  Made pt feel crazy    Seroquel [quetiapine Fumarate]    Excessive sleepyness      Medication List       Accurate as of February 27, 2019 11:59 PM. Always use your most recent med list.        ALPRAZolam 0.5 MG tablet Commonly known as:  XANAX Take 1 tablet (0.5 mg total) by mouth 3 (three) times daily.   atorvastatin 10 MG tablet Commonly known as:  LIPITOR Take 10 mg by mouth at bedtime.   bisacodyl 10 MG suppository Commonly known as:  DULCOLAX Place 10 mg rectally as needed for moderate constipation.   busPIRone 7.5 MG tablet Commonly known as:  BUSPAR Take 7.5 mg by mouth 2 (two) times daily.   carbidopa-levodopa 25-100 MG tablet Commonly known as:  SINEMET IR Take 1 tablet by mouth 3 (three) times daily.   clopidogrel 75 MG tablet Commonly known as:  PLAVIX Take 75 mg by mouth daily at 12 noon.   diltiazem 60 MG tablet  Commonly known as:  CARDIZEM Take 60 mg by mouth every 6 (six) hours.   divalproex 125 MG capsule Commonly known as:  DEPAKOTE SPRINKLE Take 125 mg by mouth 2 (two) times daily.   DULoxetine 60 MG capsule Commonly known as:  CYMBALTA Take 60 mg by mouth daily.   escitalopram 5 MG tablet Commonly known as:  LEXAPRO Take 5 mg by mouth daily.   levalbuterol 0.63 MG/3ML nebulizer solution Commonly known as:  XOPENEX Take 0.63 mg by nebulization. INHALE 1 VIAL VIA NEBULIZER EVERY 6 HOURS AS NEEDED FOR WHEEZING/SOB   losartan 25 MG tablet Commonly known as:  COZAAR Take 25 mg by mouth at  bedtime.   magnesium hydroxide 400 MG/5ML suspension Commonly known as:  MILK OF MAGNESIA Take by mouth daily as needed for mild constipation.   Melatonin 3 MG Caps Take 3 mg by mouth at bedtime.   metFORMIN 500 MG tablet Commonly known as:  GLUCOPHAGE Take 500 mg by mouth 2 (two) times daily before a meal.   montelukast 10 MG tablet Commonly known as:  SINGULAIR Take 10 mg by mouth daily at 12 noon.   NUTRITIONAL SUPPLEMENT PO Take 4 oz by mouth daily. Medpass   oxymetazoline 0.05 % nasal spray Commonly known as:  AFRIN Place 1 spray into both nostrils as needed (for nose bleeds).   pantoprazole sodium 40 mg/20 mL Pack Commonly known as:  PROTONIX Take 20 mg by mouth daily.   Tab-A-Vite Tabs Take 1 tablet by mouth daily at 12 noon.   Travoprost (BAK Free) 0.004 % Soln ophthalmic solution Commonly known as:  TRAVATAN Place 1 drop into both eyes at bedtime.   Ventolin HFA 108 (90 Base) MCG/ACT inhaler Generic drug:  albuterol Inhale 2 puffs into the lungs every 6 (six) hours as needed for wheezing or shortness of breath.       No orders of the defined types were placed in this encounter.   Immunization History  Administered Date(s) Administered  . Influenza Split 07/08/2018    Social History   Tobacco Use  . Smoking status: Never Smoker  . Smokeless tobacco: Never Used  Substance Use Topics  . Alcohol use: No    Review of Systems  DATA OBTAINED: from patient, nurse GENERAL:  no fevers, fatigue, appetite changes SKIN: No itching, rash HEENT: No complaint RESPIRATORY: No cough, wheezing, SOB CARDIAC: No chest pain, palpitations, lower extremity edema  GI: No abdominal pain, No N/V/D or constipation, No heartburn or reflux  GU: No dysuria, frequency or urgency, or incontinence  MUSCULOSKELETAL: No unrelieved bone/joint pain NEUROLOGIC: No headache, dizziness  PSYCHIATRIC: No overt anxiety;+sadness, still seeing her dead husband in her room  Vitals:    02/27/19 1355  BP: (!) 149/75  Pulse: 84  Resp: 20  Temp: 98.1 F (36.7 C)  SpO2: 93%   Body mass index is 35.51 kg/m. Physical Exam  GENERAL APPEARANCE: Alert, conversant, No acute distress  SKIN: No diaphoresis rash HEENT: Unremarkable RESPIRATORY: Breathing is even, unlabored. Lung sounds are clear   CARDIOVASCULAR: Heart RRR no murmurs, rubs or gallops. No peripheral edema  GASTROINTESTINAL: Abdomen is soft, non-tender, not distended w/ normal bowel sounds.  GENITOURINARY: Bladder non tender, not distended  MUSCULOSKELETAL: No abnormal joints or musculature NEUROLOGIC: Cranial nerves 2-12 grossly intact. Moves all extremities PSYCHIATRIC: Mood and affect-patient has been failing mentally in the past few months, no behavioral issues  Patient Active Problem List   Diagnosis Date Noted  . Dementia with behavioral  disturbance ([place]) 02/18/2019  . Chronic generalized pain 01/29/2019  . Chronic recurrent major depressive disorder ([place]) 01/29/2019  . Increased intraocular pressure, bilateral 01/29/2019  . Dysphagia 12/11/2018  . Simple chronic bronchitis ([place]) 12/11/2018  . Insomnia 12/02/2018  . Memory disorder 11/23/2018  . GERD (gastroesophageal reflux disease) 09/05/2018  . Type 2 diabetes mellitus with neurological manifestations, controlled ([place]) 07/03/2018  . Depression 07/03/2018  . Essential hypertension   . Hx of aspiration pneumonitis 06/17/2018  . Abnormality of gait 02/05/2015  . Palpitations 05/28/2014  . Cerebrovascular disease 05/25/2014  . Hyperlipidemia associated with type 2 diabetes mellitus ([place]) 05/25/2014  . Parkinson disease ([place]) 05/25/2014  . H/O: CVA (cerebrovascular accident) 05/25/2014  . Paralysis agitans ([place]) 08/31/2013  . Dizziness and giddiness 08/31/2013    CMP     Component Value Date/Time   NA 143 08/15/2018   K 4.0 08/15/2018   CL 101 06/20/2018 0541   CO2 29 06/20/2018 0541   GLUCOSE 109 (H) 06/20/2018 0541   BUN 24 (A)  08/15/2018   CREATININE 0.7 08/15/2018   CREATININE 0.79 06/20/2018 0541   CALCIUM 10.2 06/20/2018 0541   PROT 7.4 06/17/2018 1554   ALBUMIN 3.9 06/17/2018 1554   AST 22 08/08/2018   ALT 26 08/08/2018   ALKPHOS 91 08/08/2018   BILITOT 0.8 06/17/2018 1554   GFRNONAA >60 06/20/2018 0541   GFRAA >60 06/20/2018 0541   Recent Labs    06/18/18 0528 06/19/18 0524 06/20/18 0541 08/08/18 08/15/18  NA 140 141 140 142 143  K 3.1* 3.6 3.7 4.0 4.0  CL 103 102 101  --   --   CO2 30 30 29   --   --   GLUCOSE 114* 123* 109*  --   --   BUN 21 20 19 20  24*  CREATININE 0.74 0.80 0.79 0.7 0.7  CALCIUM 9.9 10.0 10.2  --   --    Recent Labs    06/17/18 1554 08/08/18  AST 34 22  ALT 26 26  ALKPHOS 71 91  BILITOT 0.8  --   PROT 7.4  --   ALBUMIN 3.9  --    Recent Labs    06/17/18 1554 06/18/18 0528 06/19/18 0524 06/20/18 0541 08/08/18  WBC 13.7* 10.0 7.3 7.3 8.5  8.5  NEUTROABS 10.7*  --  3.8 3.5  --   HGB 12.9 11.2* 11.3* 12.2 11.4*  11.4*  HCT 39.9 34.9* 35.3* 38.0 34*  34*  MCV 93.0 92.1 92.4 92.9  --   PLT 315 314 330 373 354  354   Recent Labs    08/08/18  CHOL 167  LDLCALC 71  TRIG 277*   No results found for: [facility] Lab Results  Component Value Date   TSH 3.09 08/08/2018   Lab Results  Component Value Date   HGBA1C 6.2 (A) 08/08/2018   Lab Results  Component Value Date   CHOL 167 08/08/2018   HDL 41 08/08/2018   LDLCALC 71 08/08/2018   TRIG 277 (A) 08/08/2018   CHOLHDL 5.3 05/26/2014    Significant Diagnostic Results in last 30 days:  No results found.  Assessment and Plan  GERD (gastroesophageal reflux disease) No reported problems; continue Protonix 40 mg daily  Parkinson disease Chronic and stable; continue Sinemet 100-25 p.o. every 8 hours; fall seem to be occurring when patient is transferring from bed or chair to wheelchair because she is not calling for assistance  Dementia with behavioral disturbance [facility]) Patient does not score  dementia  on her BIMs but in her daily life she appears to have been failing for the last several months; patient is due for new BIMs in May; will continue supportive care    Eural Holzschuh D. [name], MD

## 2019-02-28 ENCOUNTER — Encounter: Payer: Self-pay | Admitting: Internal Medicine

## 2019-02-28 NOTE — Assessment & Plan Note (Signed)
No reported problems; continue Protonix 40 mg daily 

## 2019-02-28 NOTE — Assessment & Plan Note (Signed)
Chronic and stable; continue Sinemet 100-25 p.o. every 8 hours; fall seem to be occurring when patient is transferring from bed or chair to wheelchair because she is not calling for assistance

## 2019-02-28 NOTE — Assessment & Plan Note (Signed)
Patient does not score dementia on her BIMs but in her daily life she appears to have been failing for the last several months; patient is due for new BIMs in May; will continue supportive care

## 2019-03-14 ENCOUNTER — Non-Acute Institutional Stay (SKILLED_NURSING_FACILITY): Payer: Medicare Other | Admitting: Internal Medicine

## 2019-03-14 ENCOUNTER — Other Ambulatory Visit: Payer: Self-pay | Admitting: Internal Medicine

## 2019-03-14 DIAGNOSIS — R350 Frequency of micturition: Secondary | ICD-10-CM | POA: Diagnosis not present

## 2019-03-14 DIAGNOSIS — R300 Dysuria: Secondary | ICD-10-CM

## 2019-03-14 DIAGNOSIS — G20 Parkinson's disease: Secondary | ICD-10-CM | POA: Diagnosis not present

## 2019-03-14 DIAGNOSIS — F0281 Dementia in other diseases classified elsewhere with behavioral disturbance: Secondary | ICD-10-CM | POA: Diagnosis not present

## 2019-03-14 MED ORDER — ALPRAZOLAM 0.5 MG PO TABS: 0.5000 mg | ORAL_TABLET | Freq: Three times a day (TID) | ORAL | 2 refills | Status: DC

## 2019-03-14 NOTE — Progress Notes (Signed)
Location:  [place] Room Number: [address] Place of Service:  SNF (31)  Hennie Duos, MD  [name], MD as PCP - General (Internal Medicine) Marylynn Pearson, MD as Referring Physician (Internal Medicine)  Extended Emergency Contact Information Primary Emergency Contact: [place] Phone: [phone] Mobile Phone: [phone] Relation: Son Secondary Emergency Contact: [place] Mobile Phone: [phone] Relation: Daughter    Allergies: Comtan [entacapone]; Mirapex [pramipexole dihydrochloride]; Sulfa antibiotics; Ciprofloxacin; and Seroquel [quetiapine fumarate]  Chief Complaint  Patient presents with  . Acute Visit    Patient is seen for behaviors.      HPI: Patient is an 85 y.o. female who nursing asked me to see for increased behaviors.  Patient will only eat half of her meals because she 72 and the rest for her dead husband.  She sees her dead husband frequently.  She is at times difficult with the staff and lashes out at them.  She also is complaining of hurting down there when she pees.  She has had no fever no nausea vomiting or chills or any other systemic symptom.  Past Medical History:  Diagnosis Date  . Abnormality of gait 02/05/2015  . Anxiety   . Arthritis   . Asthma   . Cerebrovascular disease    CVA '03  . Depression   . Dyslipidemia   . Endometriosis   . GERD (gastroesophageal reflux disease)   . Glaucoma   . Headache(784.0)   . Hypertension   . Macular degeneration    bilateral  . Memory disorder 11/23/2018  . Obesity   . Paralysis agitans ([place]) 08/31/2013  . Parkinson disease ([place])   . TIA (transient ischemic attack)    Multiple    Past Surgical History:  Procedure Laterality Date  . ABDOMINAL HYSTERECTOMY    . CATARACT EXTRACTION, BILATERAL    . GALLBLADDER SURGERY    . LOOP RECORDER IMPLANT  05-28-14   MDT LINQ implanted by Dr Caryl Comes  for cryptogenic stroke/palpitations  . LOOP RECORDER IMPLANT N/A 05/28/2014   Procedure: LOOP RECORDER IMPLANT;  Surgeon: Deboraha Sprang, MD;  Location: [facility] CATH LAB;  Service: Cardiovascular;  Laterality: N/A;  . PILONIDAL CYST RESECTION    . TILT TABLE STUDY  05-28-14   orthostatic hypertension  . TILT TABLE STUDY N/A 05/28/2014   Procedure: TILT TABLE STUDY;  Surgeon: Deboraha Sprang, MD;  Location: [facility] CATH LAB;  Service: Cardiovascular;  Laterality: N/A;  . TONSILLECTOMY      Allergies as of 03/14/2019      Reactions   Comtan [entacapone] Other (See Comments)   Reaction:  Unknown    Mirapex [pramipexole Dihydrochloride] Other (See Comments)   Reaction:  Unknown    Sulfa Antibiotics Nausea And Vomiting   Ciprofloxacin Other (See Comments)   Reaction:  Made pt feel crazy    Seroquel [quetiapine Fumarate]    Excessive sleepyness      Medication List       Accurate as of Mar 14, 2019  4:59 PM. If you have any questions, ask your nurse or doctor.        STOP taking these medications   levalbuterol 0.63 MG/3ML nebulizer solution Commonly known as:  XOPENEX Stopped by:  Inocencio Homes, MD   oxymetazoline 0.05 % nasal spray Commonly known as:  AFRIN Stopped by:  Inocencio Homes, MD   Ventolin HFA 108 [phone] Base) MCG/ACT inhaler Generic drug:  albuterol Stopped  by:  Inocencio Homes, MD     TAKE these medications   ALPRAZolam 0.5 MG tablet Commonly known as:  XANAX Take 1 tablet (0.5 mg total) by mouth 3 (three) times daily.   atorvastatin 10 MG tablet Commonly known as:  LIPITOR Take 10 mg by mouth at bedtime.   bisacodyl 10 MG suppository Commonly known as:  DULCOLAX Place 10 mg rectally as needed for moderate constipation.   busPIRone 7.5 MG tablet Commonly known as:  BUSPAR Take 7.5 mg by mouth 2 (two) times daily.   carbidopa-levodopa 25-100 MG tablet Commonly known as:  SINEMET IR Take 1 tablet by mouth 3 (three) times daily.   clopidogrel 75 MG tablet  Commonly known as:  PLAVIX Take 75 mg by mouth daily at 12 noon.   diltiazem 60 MG tablet Commonly known as:  CARDIZEM Take 60 mg by mouth every 6 (six) hours.   divalproex 125 MG capsule Commonly known as:  DEPAKOTE SPRINKLE Take 125 mg by mouth 2 (two) times daily.   DULoxetine 60 MG capsule Commonly known as:  CYMBALTA Take 60 mg by mouth daily.   escitalopram 5 MG tablet Commonly known as:  LEXAPRO Take 5 mg by mouth daily.   losartan 25 MG tablet Commonly known as:  COZAAR Take 25 mg by mouth at bedtime.   magnesium hydroxide 400 MG/5ML suspension Commonly known as:  MILK OF MAGNESIA Take by mouth daily as needed for mild constipation.   Melatonin 3 MG Caps Take 3 mg by mouth at bedtime.   metFORMIN 500 MG tablet Commonly known as:  GLUCOPHAGE Take 500 mg by mouth 2 (two) times daily before a meal.   montelukast 10 MG tablet Commonly known as:  SINGULAIR Take 10 mg by mouth daily at 12 noon.   NUTRITIONAL SUPPLEMENT PO Take 4 oz by mouth daily. Medpass   pantoprazole 40 MG tablet Commonly known as:  PROTONIX Take 40 mg by mouth daily. What changed:  Another medication with the same name was removed. Continue taking this medication, and follow the directions you see here. Changed by:  Inocencio Homes, MD   Tab-A-Vite Tabs Take 1 tablet by mouth daily at 12 noon.   Travoprost (BAK Free) 0.004 % Soln ophthalmic solution Commonly known as:  TRAVATAN Place 1 drop into both eyes at bedtime.       No orders of the defined types were placed in this encounter.   Immunization History  Administered Date(s) Administered  . Influenza Split 07/08/2018    Social History   Tobacco Use  . Smoking status: Never Smoker  . Smokeless tobacco: Never Used  Substance Use Topics  . Alcohol use: No    Review of Systems  DATA OBTAINED: from patient, nurse GENERAL:  no fevers, fatigue, appetite changes SKIN: No itching, rash HEENT: No complaint RESPIRATORY: No  cough, wheezing, SOB CARDIAC: No chest pain, palpitations, lower extremity edema  GI: No abdominal pain, No N/V/D or constipation, No heartburn or reflux  GU: + dysuria, frequency or urgency,  incontinence  MUSCULOSKELETAL: No unrelieved bone/joint pain NEUROLOGIC: No headache, dizziness  PSYCHIATRIC; seeing her husband who is dead  Vitals:   Mar 29, 2019 1646  BP: 136/80  Pulse: 81  Resp: 18  Temp: (!) 97.4 F (36.3 C)  SpO2: 94%   Body mass index is 35.51 kg/m. Physical Exam  GENERAL APPEARANCE: Alert, conversant, No acute distress  SKIN: No diaphoresis rash HEENT: Unremarkable RESPIRATORY: Breathing is even, unlabored. Lung sounds are clear  CARDIOVASCULAR: Heart RRR no murmurs, rubs or gallops. No peripheral edema  GASTROINTESTINAL: Abdomen is soft, non-tender, not distended w/ normal bowel sounds.  GENITOURINARY: Bladder non tender, not distended  MUSCULOSKELETAL: No abnormal joints or musculature NEUROLOGIC: Cranial nerves 2-12 grossly intact. Moves all extremities PSYCHIATRIC: Mood and affect ,Behavioral issues Recently Patient Active Problem List   Diagnosis Date Noted  . Dementia with behavioral disturbance ([place]) 02/18/2019  . Chronic generalized pain 01/29/2019  . Chronic recurrent major depressive disorder ([place]) 01/29/2019  . Increased intraocular pressure, bilateral 01/29/2019  . Dysphagia 12/11/2018  . Simple chronic bronchitis ([place]) 12/11/2018  . Insomnia 12/02/2018  . Memory disorder 11/23/2018  . GERD (gastroesophageal reflux disease) 09/05/2018  . Type 2 diabetes mellitus with neurological manifestations, controlled ([place]) 07/03/2018  . Depression 07/03/2018  . Essential hypertension   . Hx of aspiration pneumonitis 06/17/2018  . Abnormality of gait 02/05/2015  . Palpitations 05/28/2014  . Cerebrovascular disease 05/25/2014  . Hyperlipidemia associated with type 2 diabetes mellitus ([place]) 05/25/2014  . Parkinson disease ([place]) 05/25/2014  . H/O: CVA  (cerebrovascular accident) 05/25/2014  . Paralysis agitans ([place]) 08/31/2013  . Dizziness and giddiness 08/31/2013    CMP     Component Value Date/Time   NA 143 08/15/2018   K 4.0 08/15/2018   CL 101 06/20/2018 0541   CO2 29 06/20/2018 0541   GLUCOSE 109 (H) 06/20/2018 0541   BUN 24 (A) 08/15/2018   CREATININE 0.7 08/15/2018   CREATININE 0.79 06/20/2018 0541   CALCIUM 10.2 06/20/2018 0541   PROT 7.4 06/17/2018 1554   ALBUMIN 3.9 06/17/2018 1554   AST 22 08/08/2018   ALT 26 08/08/2018   ALKPHOS 91 08/08/2018   BILITOT 0.8 06/17/2018 1554   GFRNONAA >60 06/20/2018 0541   GFRAA >60 06/20/2018 0541   Recent Labs    06/18/18 0528 06/19/18 0524 06/20/18 0541 08/08/18 08/15/18  NA 140 141 140 142 143  K 3.1* 3.6 3.7 4.0 4.0  CL 103 102 101  --   --   CO2 30 30 29   --   --   GLUCOSE 114* 123* 109*  --   --   BUN 21 20 19 20  24*  CREATININE 0.74 0.80 0.79 0.7 0.7  CALCIUM 9.9 10.0 10.2  --   --    Recent Labs    06/17/18 1554 08/08/18  AST 34 22  ALT 26 26  ALKPHOS 71 91  BILITOT 0.8  --   PROT 7.4  --   ALBUMIN 3.9  --    Recent Labs    06/17/18 1554 06/18/18 0528 06/19/18 0524 06/20/18 0541 08/08/18  WBC 13.7* 10.0 7.3 7.3 8.5  8.5  NEUTROABS 10.7*  --  3.8 3.5  --   HGB 12.9 11.2* 11.3* 12.2 11.4*  11.4*  HCT 39.9 34.9* 35.3* 38.0 34*  34*  MCV 93.0 92.1 92.4 92.9  --   PLT 315 314 330 373 354  354   Recent Labs    08/08/18  CHOL 167  LDLCALC 71  TRIG 277*   No results found for: [facility] Lab Results  Component Value Date   TSH 3.09 08/08/2018   Lab Results  Component Value Date   HGBA1C 6.2 (A) 08/08/2018   Lab Results  Component Value Date   CHOL 167 08/08/2018   HDL 41 08/08/2018   LDLCALC 71 08/08/2018   TRIG 277 (A) 08/08/2018   CHOLHDL 5.3 05/26/2014    Significant Diagnostic Results in last 30 days:  No results found.  Assessment and Plan  Patient with behaviors- I did not really think patient had dementia before but  unfortunately the isolation that is necessary because of COVID and the lack of socialization is hurting everyone and is most certainly impacting on this patient; her sightings of her husband are daily but he is to be just occasional, and her behaviors toward staff are starting to escalate; we have put her on the medications that we can, unfortunately we cannot use an antipsychotics because they make patient excessively sleepy; will monitor her behaviors  Dysuria/frequency-and behaviors as well worsening- about 3 weeks ago patient had a urinalysis that had 80,000 of E. coli which was not treated but will now get a new urine, since patient has symptoms and follow-up  No problem-specific Assessment & Plan notes found for this encounter.   Labs/tests ordered:    [name], MD

## 2019-03-16 ENCOUNTER — Other Ambulatory Visit: Payer: Self-pay

## 2019-03-16 ENCOUNTER — Emergency Department (HOSPITAL_COMMUNITY)
Admission: EM | Admit: 2019-03-16 | Discharge: 2019-03-17 | Disposition: A | Discharge status: Other Acute Inpatient Hospital | Payer: Medicare Other | Attending: Emergency Medicine | Admitting: Emergency Medicine

## 2019-03-16 ENCOUNTER — Encounter (HOSPITAL_COMMUNITY): Payer: Self-pay | Admitting: Pharmacy Technician

## 2019-03-16 DIAGNOSIS — Z7984 Long term (current) use of oral hypoglycemic drugs: Secondary | ICD-10-CM | POA: Diagnosis not present

## 2019-03-16 DIAGNOSIS — J45909 Unspecified asthma, uncomplicated: Secondary | ICD-10-CM | POA: Insufficient documentation

## 2019-03-16 DIAGNOSIS — E119 Type 2 diabetes mellitus without complications: Secondary | ICD-10-CM | POA: Insufficient documentation

## 2019-03-16 DIAGNOSIS — R4589 Other symptoms and signs involving emotional state: Secondary | ICD-10-CM | POA: Insufficient documentation

## 2019-03-16 DIAGNOSIS — Z79899 Other long term (current) drug therapy: Secondary | ICD-10-CM | POA: Diagnosis not present

## 2019-03-16 DIAGNOSIS — I10 Essential (primary) hypertension: Secondary | ICD-10-CM | POA: Diagnosis not present

## 2019-03-16 DIAGNOSIS — Z1159 Encounter for screening for other viral diseases: Secondary | ICD-10-CM | POA: Insufficient documentation

## 2019-03-16 DIAGNOSIS — Z8673 Personal history of transient ischemic attack (TIA), and cerebral infarction without residual deficits: Secondary | ICD-10-CM | POA: Insufficient documentation

## 2019-03-16 DIAGNOSIS — Z7902 Long term (current) use of antithrombotics/antiplatelets: Secondary | ICD-10-CM | POA: Insufficient documentation

## 2019-03-16 DIAGNOSIS — G20 Parkinson's disease: Secondary | ICD-10-CM | POA: Insufficient documentation

## 2019-03-16 DIAGNOSIS — F0390 Unspecified dementia without behavioral disturbance: Secondary | ICD-10-CM | POA: Diagnosis not present

## 2019-03-16 DIAGNOSIS — R4689 Other symptoms and signs involving appearance and behavior: Secondary | ICD-10-CM

## 2019-03-16 LAB — CBC
HCT: 36.6 % (ref 36.0–46.0)
Hemoglobin: 11.8 g/dL — ABNORMAL LOW (ref 12.0–15.0)
MCH: 29.6 pg (ref 26.0–34.0)
MCHC: 32.2 g/dL (ref 30.0–36.0)
MCV: 91.7 fL (ref 80.0–100.0)
Platelets: 348 10*3/uL (ref 150–400)
RBC: 3.99 MIL/uL (ref 3.87–5.11)
RDW: 13.1 % (ref 11.5–15.5)
WBC: 10.8 10*3/uL — ABNORMAL HIGH (ref 4.0–10.5)
nRBC: 0 % (ref 0.0–0.2)

## 2019-03-16 LAB — URINALYSIS, ROUTINE W REFLEX MICROSCOPIC
Bacteria, UA: NONE SEEN
Bilirubin Urine: NEGATIVE
Glucose, UA: NEGATIVE mg/dL
Ketones, ur: NEGATIVE mg/dL
Leukocytes,Ua: NEGATIVE
Nitrite: NEGATIVE
Protein, ur: NEGATIVE mg/dL
Specific Gravity, Urine: 1.015 (ref 1.005–1.030)
pH: 6 (ref 5.0–8.0)

## 2019-03-16 LAB — VALPROIC ACID LEVEL: Valproic Acid Lvl: 20 ug/mL — ABNORMAL LOW (ref 50.0–100.0)

## 2019-03-16 LAB — RAPID URINE DRUG SCREEN, HOSP PERFORMED
Amphetamines: NOT DETECTED
Barbiturates: NOT DETECTED
Benzodiazepines: POSITIVE — AB
Cocaine: NOT DETECTED
Opiates: NOT DETECTED
Tetrahydrocannabinol: NOT DETECTED

## 2019-03-16 LAB — COMPREHENSIVE METABOLIC PANEL
ALT: 14 U/L (ref 0–44)
AST: 20 U/L (ref 15–41)
Albumin: 3.2 g/dL — ABNORMAL LOW (ref 3.5–5.0)
Alkaline Phosphatase: 81 U/L (ref 38–126)
Anion gap: 12 (ref 5–15)
BUN: 24 mg/dL — ABNORMAL HIGH (ref 8–23)
CO2: 27 mmol/L (ref 22–32)
Calcium: 9.8 mg/dL (ref 8.9–10.3)
Chloride: 102 mmol/L (ref 98–111)
Creatinine, Ser: 1.03 mg/dL — ABNORMAL HIGH (ref 0.44–1.00)
GFR calc Af Amer: 57 mL/min — ABNORMAL LOW (ref >60)
GFR calc non Af Amer: 49 mL/min — ABNORMAL LOW (ref >60)
Glucose, Bld: 150 mg/dL — ABNORMAL HIGH (ref 70–99)
Potassium: 3.5 mmol/L (ref 3.5–5.1)
Sodium: 141 mmol/L (ref 135–145)
Total Bilirubin: 0.4 mg/dL (ref 0.3–1.2)
Total Protein: 6.5 g/dL (ref 6.5–8.1)

## 2019-03-16 LAB — SALICYLATE LEVEL: Salicylate Lvl: <7 mg/dL (ref 2.8–30.0)

## 2019-03-16 LAB — ETHANOL: Alcohol, Ethyl (B): <10 mg/dL (ref <10)

## 2019-03-16 LAB — ACETAMINOPHEN LEVEL: Acetaminophen (Tylenol), Serum: <10 ug/mL — ABNORMAL LOW (ref 10–30)

## 2019-03-16 MED ORDER — CARBIDOPA-LEVODOPA 25-100 MG PO TABS
1.0000 | ORAL_TABLET | Freq: Three times a day (TID) | ORAL | Status: DC
  Administered 2019-03-16: 19:00:00 1 via ORAL
  Filled 2019-03-16 (×5): qty 1

## 2019-03-16 MED ORDER — ATORVASTATIN CALCIUM 10 MG PO TABS: 10.0000 mg | ORAL_TABLET | Freq: Every day | ORAL | Status: DC

## 2019-03-16 MED ORDER — ESCITALOPRAM OXALATE 10 MG PO TABS: 5.0000 mg | ORAL_TABLET | Freq: Every day | ORAL | Status: DC

## 2019-03-16 MED ORDER — DULOXETINE HCL 60 MG PO CPEP: 60.0000 mg | ORAL_CAPSULE | Freq: Every day | ORAL | Status: DC

## 2019-03-16 MED ORDER — DILTIAZEM HCL 60 MG PO TABS
60.0000 mg | ORAL_TABLET | Freq: Four times a day (QID) | ORAL | Status: DC
  Administered 2019-03-16 – 2019-03-17 (×2): 60 mg via ORAL
  Filled 2019-03-16 (×5): qty 1

## 2019-03-16 MED ORDER — CLOPIDOGREL BISULFATE 75 MG PO TABS
75.0000 mg | ORAL_TABLET | Freq: Every day | ORAL | Status: DC
  Filled 2019-03-16 (×2): qty 1

## 2019-03-16 MED ORDER — LORAZEPAM 2 MG/ML IJ SOLN
1.0000 mg | Freq: Once | INTRAMUSCULAR | Status: AC
  Administered 2019-03-16: 20:00:00 1 mg via INTRAMUSCULAR
  Filled 2019-03-16: qty 1

## 2019-03-16 MED ORDER — PANTOPRAZOLE SODIUM 40 MG PO TBEC
40.0000 mg | DELAYED_RELEASE_TABLET | Freq: Every day | ORAL | Status: DC
  Administered 2019-03-16: 18:00:00 40 mg via ORAL
  Filled 2019-03-16: qty 1

## 2019-03-16 MED ORDER — METFORMIN HCL 500 MG PO TABS
500.0000 mg | ORAL_TABLET | Freq: Two times a day (BID) | ORAL | Status: DC
  Administered 2019-03-16 – 2019-03-17 (×2): 500 mg via ORAL
  Filled 2019-03-16 (×2): qty 1

## 2019-03-16 MED ORDER — ALPRAZOLAM 0.5 MG PO TABS
0.5000 mg | ORAL_TABLET | Freq: Three times a day (TID) | ORAL | Status: DC
  Administered 2019-03-16: 18:00:00 0.5 mg via ORAL
  Filled 2019-03-16: qty 1

## 2019-03-16 MED ORDER — MONTELUKAST SODIUM 10 MG PO TABS
10.0000 mg | ORAL_TABLET | Freq: Every day | ORAL | Status: DC
  Filled 2019-03-16: qty 1

## 2019-03-16 MED ORDER — BUSPIRONE HCL 15 MG PO TABS
7.5000 mg | ORAL_TABLET | Freq: Two times a day (BID) | ORAL | Status: DC
  Filled 2019-03-16 (×3): qty 1

## 2019-03-16 MED ORDER — LORAZEPAM 1 MG PO TABS: 1.0000 mg | ORAL_TABLET | Freq: Once | ORAL | Status: DC

## 2019-03-16 MED ORDER — LOSARTAN POTASSIUM 50 MG PO TABS: 25.0000 mg | ORAL_TABLET | Freq: Every day | ORAL | Status: DC

## 2019-03-16 MED ORDER — DIVALPROEX SODIUM 125 MG PO CSDR: 125.0000 mg | DELAYED_RELEASE_CAPSULE | Freq: Two times a day (BID) | ORAL | Status: DC

## 2019-03-16 NOTE — ED Notes (Signed)
Gray IVC bractlet placed on pt rt wrist

## 2019-03-16 NOTE — ED Notes (Addendum)
Pt purposely incontinent of urine. After urinating in the bed, pt said to me "I peed in the bed". When asked why she did not state she needed to go, pt stated" figured you'd like the smell" and then laughed. Pt washed with assistance of NT [facility]

## 2019-03-16 NOTE — ED Provider Notes (Signed)
[facility] EMERGENCY DEPARTMENT Provider Note   CSN: [id number] Arrival date & time: 03/16/19  1505    History   Chief Complaint Chief Complaint  Patient presents with  . Psychiatric Evaluation    HPI [name] is a 85 y.o. female.     HPI Patient is a nursing home patient with history of depression, anxiety and Parkinson disease.  Had IVC paperwork filed by staff due to patient becoming increasingly delusional regarding her deceased husband and accused staff of trying to poison her. Patient also has been intermittently taking her medications.  Accompanied by police.  Transported by EMS.  Patient vital signs stable in route.  Per staff patient mental status reportedly decompensated rapidly over the last few days.  Patient is calm and currently has no complaints.  She does not know why she is here. Past Medical History:  Diagnosis Date  . Abnormality of gait 02/05/2015  . Anxiety   . Arthritis   . Asthma   . Cerebrovascular disease    CVA '03  . Depression   . Dyslipidemia   . Endometriosis   . GERD (gastroesophageal reflux disease)   . Glaucoma   . Headache(784.0)   . Hypertension   . Macular degeneration    bilateral  . Memory disorder 11/23/2018  . Obesity   . Paralysis agitans ([place]) 08/31/2013  . Parkinson disease ([place])   . TIA (transient ischemic attack)    Multiple    Patient Active Problem List   Diagnosis Date Noted  . Dysuria 03/17/2019  . Dementia with behavioral problem ([place]) 02/18/2019  . Chronic generalized pain 01/29/2019  . Chronic recurrent major depressive disorder ([place]) 01/29/2019  . Increased intraocular pressure, bilateral 01/29/2019  . Dysphagia 12/11/2018  . Simple chronic bronchitis ([place]) 12/11/2018  . Insomnia 12/02/2018  . Memory disorder 11/23/2018  . GERD (gastroesophageal reflux disease) 09/05/2018  . Type 2 diabetes mellitus with neurological manifestations, controlled ([place]) 07/03/2018  . Depression  07/03/2018  . Essential hypertension   . Hx of aspiration pneumonitis 06/17/2018  . Abnormality of gait 02/05/2015  . Palpitations 05/28/2014  . Cerebrovascular disease 05/25/2014  . Hyperlipidemia associated with type 2 diabetes mellitus ([place]) 05/25/2014  . Parkinson disease ([place]) 05/25/2014  . H/O: CVA (cerebrovascular accident) 05/25/2014  . Paralysis agitans ([place]) 08/31/2013  . Dizziness and giddiness 08/31/2013    Past Surgical History:  Procedure Laterality Date  . ABDOMINAL HYSTERECTOMY    . CATARACT EXTRACTION, BILATERAL    . GALLBLADDER SURGERY    . LOOP RECORDER IMPLANT  05-28-14   MDT LINQ implanted by Dr Caryl Comes for cryptogenic stroke/palpitations  . LOOP RECORDER IMPLANT N/A 05/28/2014   Procedure: LOOP RECORDER IMPLANT;  Surgeon: Deboraha Sprang, MD;  Location[facility] LAB;  Service: Cardiovascular;  Laterality: N/A;  . PILONIDAL CYST RESECTION    . TILT TABLE STUDY  05-28-14   orthostatic hypertension  . TILT TABLE STUDY N/A 05/28/2014   Procedure: TILT TABLE STUDY;  Surgeon: Deboraha Sprang, MD;  Location: [facility] CATH LAB;  Service: Cardiovascular;  Laterality: N/A;  . TONSILLECTOMY       OB History   No obstetric history on file.      Home Medications    Prior to Admission medications   Medication Sig Start Date End Date Taking? Authorizing Provider  ALPRAZolam Duanne Moron) 0.5 MG tablet Take 1 tablet (0.5 mg total) by mouth 3 (three) times daily. 03/14/19  Yes Hennie Duos, MD  atorvastatin (LIPITOR)  10 MG tablet Take 10 mg by mouth at bedtime.    Yes [provider]  bisacodyl (DULCOLAX) 10 MG suppository Place 10 mg rectally daily as needed (for constipation not relieved by milk of magnesia).    Yes [provider]  busPIRone (BUSPAR) 7.5 MG tablet Take 7.5 mg by mouth 2 (two) times daily.    Yes [provider]  carbidopa-levodopa (SINEMET IR) 25-100 MG tablet Take 1 tablet by mouth every 8 (eight) hours.    Yes [provider]   clopidogrel (PLAVIX) 75 MG tablet Take 75 mg by mouth daily at 12 noon.    Yes [provider]  diltiazem (CARDIZEM) 60 MG tablet Take 60 mg by mouth every 6 (six) hours.    Yes [provider]  divalproex (DEPAKOTE SPRINKLE) 125 MG capsule Take 125 mg by mouth 3 (three) times daily.    Yes [provider]  DULoxetine (CYMBALTA) 60 MG capsule Take 60 mg by mouth daily.   Yes [provider]  escitalopram (LEXAPRO) 5 MG tablet Take 5 mg by mouth daily.    Yes [provider]  guaiFENesin (ROBITUSSIN) 100 MG/5ML liquid Take 200 mg by mouth every 4 (four) hours as needed for cough (for 2 days).   Yes [provider]  losartan (COZAAR) 25 MG tablet Take 25 mg by mouth at bedtime.    Yes [provider]  magnesium hydroxide (MILK OF MAGNESIA) 400 MG/5ML suspension Take 30 mLs by mouth daily as needed (if no bowel movement in 3 days).    Yes [provider]  Melatonin 3 MG CAPS Take 3 mg by mouth at bedtime.   Yes [provider]  metFORMIN (GLUCOPHAGE) 500 MG tablet Take 500 mg by mouth 2 (two) times daily before a meal.    Yes [provider]  montelukast (SINGULAIR) 10 MG tablet Take 10 mg by mouth daily at 12 noon.    Yes [provider]  Multiple Vitamin (TAB-A-VITE) TABS Take 1 tablet by mouth daily at 12 noon.    Yes [provider]  OVER THE COUNTER MEDICATION Take 120 mLs by mouth See admin instructions. MED PASS  NSA: Drink 120 ml's by mouth once a day   Yes [provider]  pantoprazole (PROTONIX) 40 MG tablet Take 40 mg by mouth daily before breakfast.   Yes [provider]  Sodium Phosphates (RA SALINE ENEMA RE) Place 1 enema rectally daily as needed (for constipation not relieved by Dulcolax suppository and contact MD if no relief from enema).    Yes [provider]  Travoprost, BAK Free, (TRAVATAN Z) 0.004 % SOLN ophthalmic solution Place 1 drop into both  eyes at bedtime.   Yes [provider]    Family History Family History  Problem Relation Age of Onset  . Heart attack Mother   . Arthritis Brother   . Skin cancer Brother     Social History Social History   Tobacco Use  . Smoking status: Never Smoker  . Smokeless tobacco: Never Used  Substance Use Topics  . Alcohol use: No  . Drug use: No     Allergies   Comtan [entacapone]; Mirapex [pramipexole dihydrochloride]; Sulfa antibiotics; Ciprofloxacin; and Seroquel [quetiapine fumarate]   Review of Systems Review of Systems  Constitutional: Negative for chills and fever.  Respiratory: Negative for cough and shortness of breath.   Cardiovascular: Negative for chest pain.  Gastrointestinal: Negative for abdominal pain, constipation, diarrhea, nausea and  vomiting.  Genitourinary: Negative for dysuria, flank pain and frequency.  Musculoskeletal: Negative for back pain, myalgias and neck pain.  Skin: Negative for rash and wound.  Neurological: Negative for dizziness, weakness, light-headedness, numbness and headaches.  Psychiatric/Behavioral: Positive for agitation and confusion.  All other systems reviewed and are negative.    Physical Exam Updated Vital Signs BP (!) 154/87 (BP Location: Left Arm)   Pulse (!) 106   Temp 99.1 F (37.3 C) (Oral)   Resp 18   SpO2 96%   Physical Exam Vitals signs and nursing note reviewed.  Constitutional:      Appearance: Normal appearance. She is well-developed.  HENT:     Head: Normocephalic and atraumatic.     Comments: Cranial nerves II through XII intact.    Right Ear: There is no impacted cerumen.     Left Ear: There is no impacted cerumen.     Mouth/Throat:     Mouth: Mucous membranes are moist.  Eyes:     Extraocular Movements: Extraocular movements intact.     Pupils: Pupils are equal, round, and reactive to light.  Neck:     Musculoskeletal: Normal range of motion and neck supple. No neck rigidity or muscular  tenderness.     Comments: No meningismus Cardiovascular:     Rate and Rhythm: Normal rate and regular rhythm.     Heart sounds: No murmur. No friction rub. No gallop.   Pulmonary:     Effort: Pulmonary effort is normal. No respiratory distress.     Breath sounds: Normal breath sounds. No stridor. No wheezing, rhonchi or rales.  Chest:     Chest wall: No tenderness.  Abdominal:     General: Bowel sounds are normal.     Palpations: Abdomen is soft.     Tenderness: There is no abdominal tenderness. There is no right CVA tenderness, left CVA tenderness, guarding or rebound.  Musculoskeletal: Normal range of motion.        General: No swelling, tenderness, deformity or signs of injury.     Right lower leg: No edema.     Left lower leg: No edema.  Lymphadenopathy:     Cervical: No cervical adenopathy.  Skin:    General: Skin is warm and dry.     Capillary Refill: Capillary refill takes less than 2 seconds.     Findings: No erythema or rash.  Neurological:     General: No focal deficit present.     Mental Status: She is alert and oriented to person, place, and time.     Motor: No weakness.     Comments: Moving all extremities without focal deficit.  Sensation intact.  Psychiatric:        Behavior: Behavior normal.     Comments: Cooperative      ED Treatments / Results  Labs (all labs ordered are listed, but only abnormal results are displayed) Labs Reviewed  COMPREHENSIVE METABOLIC PANEL - Abnormal; Notable for the following components:      Result Value   Glucose, Bld 150 (*)    BUN 24 (*)    Creatinine, Ser 1.03 (*)    Albumin 3.2 (*)    GFR calc non Af Amer 49 (*)    GFR calc Af Amer 57 (*)    All other components within normal limits  ACETAMINOPHEN LEVEL - Abnormal; Notable for the following components:   Acetaminophen (Tylenol), Serum <10 (*)    All other components within normal limits  CBC -  Abnormal; Notable for the following components:   WBC 10.8 (*)     Hemoglobin 11.8 (*)    All other components within normal limits  RAPID URINE DRUG SCREEN, HOSP PERFORMED - Abnormal; Notable for the following components:   Benzodiazepines POSITIVE (*)    All other components within normal limits  URINALYSIS, ROUTINE W REFLEX MICROSCOPIC - Abnormal; Notable for the following components:   Hgb urine dipstick SMALL (*)    All other components within normal limits  VALPROIC ACID LEVEL - Abnormal; Notable for the following components:   Valproic Acid Lvl 20 (*)    All other components within normal limits  CBG MONITORING, ED - Abnormal; Notable for the following components:   Glucose-Capillary 118 (*)    All other components within normal limits  SARS CORONAVIRUS 2 (HOSPITAL ORDER, [facility])  ETHANOL  SALICYLATE LEVEL  CBG MONITORING, ED    EKG EKG Interpretation  Date/Time:  Friday Mar 17 2019 09:38:19 EDT Ventricular Rate:  98 PR Interval:  230 QRS Duration: 90 QT Interval:  394 QTC Calculation: 503 R Axis:   -71 Text Interpretation:  Sinus rhythm with 1st degree A-V block with Premature supraventricular complexes and with occasional Premature ventricular complexes Left axis deviation Low voltage QRS Cannot rule out Anterior infarct , age undetermined Abnormal ECG Confirmed by Lennice Sites [phone]) on 03/17/2019 9:42:56 AM   Radiology Dg Chest Portable 1 View  Result Date: 03/17/2019 CLINICAL DATA:  Behavioral health admission. EXAM: PORTABLE CHEST 1 VIEW COMPARISON:  06/18/2018 FINDINGS: Loop recorder in place. Chronic left ventricular prominence and aortic atherosclerosis. Poor inspiration. Allowing for that, no evidence active heart failure, infiltrate, collapse or effusion. Chronic right humeral fracture. IMPRESSION: Poor inspiration.  No active disease suspected. Electronically Signed   By: Nelson Chimes M.D.   On: 03/17/2019 10:19    Procedures Procedures (including critical care time)  Medications Ordered  in ED Medications  LORazepam (ATIVAN) injection 1 mg (1 mg Intramuscular Given 03/16/19 2008)  LORazepam (ATIVAN) injection 1 mg (1 mg Intramuscular Given 03/17/19 0850)  haloperidol lactate (HALDOL) injection 2.5 mg (2.5 mg Intramuscular Given 03/17/19 1039)     Initial Impression / Assessment and Plan / ED Course  I have reviewed the triage vital signs and the nursing notes.  Pertinent labs & imaging results that were available during my care of the patient were reviewed by me and considered in my medical decision making (see chart for details).       Medically cleared for psychiatric evaluation.   Final Clinical Impressions(s) / ED Diagnoses   Final diagnoses:  Aggressive behavior    ED Discharge Orders    None       Julianne Rice, MD 03/18/19 [phone]

## 2019-03-16 NOTE — ED Notes (Signed)
TTS in process 

## 2019-03-16 NOTE — BH Assessment (Addendum)
Tele Assessment Note   Patient Name: [name] MRN: [id number] Referring Physician: Julianne Rice, MD Location of Patient: [facility] Location of Provider: Behavioral Health TTS Department  [name] is a widowed 85 y.o. female who presents involuntarily to [facility]. Pt was petitioned by her assisted living facility staff for sudden increase in delusional behavior with physical acting out and accusing staff of poisoning her. Pt has a history of Depression. She reports a suicide attempt 1 X when she was in her 35's. Later in conversation, pt changed that to her being in her 51's since she knew she had the children. Pt states she can't relate to what made her so unhappy at that time that she would attempt suicide by overdosing on xanax. Pt denies hx of alcohol or drug use. She states she was raised by an uncle who had a severe alcohol addiction that prevented her from wanting to use alcohol.  Pt reports medication compliance. Pt admits at times she has AVH, and relates it to her Parkinson's dx. Pt denies current suicidal ideation.  She acknowledges symptoms of Depression including: isolating, feelings of guilt & worthlessness, sadness, tearfulness and anhedonia. Pt denies homicidal ideation/ history of violence. Pt states current stressors include continued grieving of her husband's death last Christmas & her Parkinson's dx.   Pt lives at an [facility] in [place]), and supports include dtr and son. Pt appeared especially fond of her son who visited recently from the beach. Pt has fair insight and judgment. Pt's memory is impaired- it seems to come & go. Pt stated she was going to remarry, then she reported she was still married to to [place], who she was married to for 75 years. She even stated she may need to get permission from her spouse to remarry. Pt later appeared to realize the mistake & stated Parkinson's causes problems with her memory.?  MSE: Pt is dressed in hospital  gown, looking somewhat disheveled lying in hosptial bed. Pt is alert, oriented to self, place and year with soft, partially aphasic speech and normal motor behavior. Eye contact is good. Pt's mood is depressed and affect is constricted. Affect is congruent with mood. Thought process is mostly coherent and relevant. There is no indication pt is currently responding to internal stimuli or experiencing delusional thought content. Pt was cooperative throughout assessment.    Diagnosis: Parkinson's Disease Disposition: Dr.Cobos, MD recommends geropsych hospitalization    Past Medical History:  Past Medical History:  Diagnosis Date  . Abnormality of gait 02/05/2015  . Anxiety   . Arthritis   . Asthma   . Cerebrovascular disease    CVA '03  . Depression   . Dyslipidemia   . Endometriosis   . GERD (gastroesophageal reflux disease)   . Glaucoma   . Headache(784.0)   . Hypertension   . Macular degeneration    bilateral  . Memory disorder 11/23/2018  . Obesity   . Paralysis agitans ([place]) 08/31/2013  . Parkinson disease ([place])   . TIA (transient ischemic attack)    Multiple    Past Surgical History:  Procedure Laterality Date  . ABDOMINAL HYSTERECTOMY    . CATARACT EXTRACTION, BILATERAL    . GALLBLADDER SURGERY    . LOOP RECORDER IMPLANT  05-28-14   MDT LINQ implanted by Dr Caryl Comes for cryptogenic stroke/palpitations  . LOOP RECORDER IMPLANT N/A 05/28/2014   Procedure: LOOP RECORDER IMPLANT;  Surgeon: Deboraha Sprang, MD;  Location: [facility] CATH LAB;  Service: Cardiovascular;  Laterality: N/A;  . PILONIDAL CYST RESECTION    . TILT TABLE STUDY  05-28-14   orthostatic hypertension  . TILT TABLE STUDY N/A 05/28/2014   Procedure: TILT TABLE STUDY;  Surgeon: Deboraha Sprang, MD;  Location: [facility] CATH LAB;  Service: Cardiovascular;  Laterality: N/A;  . TONSILLECTOMY      Family History:  Family History  Problem Relation Age of Onset  . Heart attack Mother   . Arthritis Brother   . Skin cancer  Brother     Social History:  reports that she has never smoked. She has never used smokeless tobacco. She reports that she does not drink alcohol or use drugs.  Additional Social History:     CIWA: CIWA-Ar BP: 133/74 Pulse Rate: 88 COWS:    Allergies:  Allergies  Allergen Reactions  . Comtan [Entacapone] Other (See Comments)    "Allergic," per MAR  . Mirapex [Pramipexole Dihydrochloride] Other (See Comments)    "Allergic," per MAR  . Sulfa Antibiotics Nausea And Vomiting  . Ciprofloxacin Other (See Comments)    Made the patient "feel crazy" and "Allergic," per MAR  . Seroquel [Quetiapine Fumarate] Other (See Comments)    Excessive sleepiness     Home Medications: (Not in a hospital admission)   OB/GYN Status:  No LMP recorded. Patient has had a hysterectomy.  General Assessment Data TTS Assessment: In system Is this a Tele or Face-to-Face Assessment?: Tele Assessment Is this an Initial Assessment or a Re-assessment for this encounter?: Initial Assessment Patient Accompanied by:: N/A Language Other than English: No Living Arrangements: In Assisted Living/Nursing Home (Comment: Name of Nursing Home What gender do you identify as?: Female Marital status: Widowed Living Arrangements: Other (Comment)(Assisted living) Can pt return to current living arrangement?: Yes Admission Status: Involuntary Is patient capable of signing voluntary admission?: Yes Referral Source: MD Insurance type: medicare     Crisis Care Plan Living Arrangements: Other (Comment)(Assisted living) Name of Psychiatrist: years ago Name of Therapist: no  Education Status Is patient currently in school?: No Is the patient employed, unemployed or receiving disability?: Unemployed  Risk to self with the past 6 months Suicidal Ideation: No Has patient been a risk to self within the past 6 months prior to admission? : No Suicidal Intent: No Has patient had any suicidal intent within the past 6  months prior to admission? : No Is patient at risk for suicide?: No Suicidal Plan?: No Has patient had any suicidal plan within the past 6 months prior to admission? : No Access to Means: No What has been your use of drugs/alcohol within the last 12 months?: denies current or problem by hx Previous Attempts/Gestures: Yes How many times?: 1 Other Self Harm Risks: previous attempt;  Triggers for Past Attempts: Unknown Intentional Self Injurious Behavior: None Family Suicide History: No Recent stressful life event(s): Loss (Comment)(spouse of 73 years passed away last Christmas) Persecutory voices/beliefs?: Yes(pt states her Parkinson's causes occasional AVH) Depression: Yes Depression Symptoms: Despondent, Tearfulness, Isolating, Guilt, Loss of interest in usual pleasures, Feeling worthless/self pity, Feeling angry/irritable Suicide prevention information given to non-admitted patients: Not applicable  Risk to Others within the past 6 months Homicidal Ideation: No Does patient have any lifetime risk of violence toward others beyond the six months prior to admission? : No Thoughts of Harm to Others: No Current Homicidal Intent: No Current Homicidal Plan: No Access to Homicidal Means: No History of harm to others?: No Assessment of Violence: None Noted Criminal Charges Pending?: No Does  patient have a court date: No Is patient on probation?: No  Psychosis Hallucinations: Auditory, Visual(at times due to Parkinsons, not currently) Delusions: (at times pt believes her husband is still alive)  Mental Status Report Appearance/Hygiene: Disheveled, In hospital gown Eye Contact: Good Motor Activity: Freedom of movement Speech: Logical/coherent Level of Consciousness: Alert Mood: Depressed, Pleasant Affect: Constricted Anxiety Level: Minimal Thought Processes: Coherent, Relevant Judgement: Partial Orientation: Person, Place Obsessive Compulsive Thoughts/Behaviors: None  Cognitive  Functioning Concentration: Good Memory: Recent Impaired, Remote Impaired Is patient IDD: No Insight: Fair Impulse Control: Good Appetite: Good Have you had any weight changes? : Loss Sleep: Decreased Total Hours of Sleep: 6  ADLScreening [facility] Assessment Services) Patient's cognitive ability adequate to safely complete daily activities?: Yes Patient able to express need for assistance with ADLs?: Yes Independently performs ADLs?: No  Prior Inpatient Therapy Prior Inpatient Therapy: No  Prior Outpatient Therapy Prior Outpatient Therapy: No Does patient have an ACCT team?: No Does patient have Intensive In-House Services?  : No Does patient have [facility] services? : No Does patient have P4CC services?: No  ADL Screening (condition at time of admission) Patient's cognitive ability adequate to safely complete daily activities?: Yes Is the patient deaf or have difficulty hearing?: No Does the patient have difficulty seeing, even when wearing glasses/contacts?: No Does the patient have difficulty concentrating, remembering, or making decisions?: Yes Patient able to express need for assistance with ADLs?: Yes Does the patient have difficulty dressing or bathing?: Yes Independently performs ADLs?: No Does the patient have difficulty walking or climbing stairs?: Yes Weakness of Legs: Both Weakness of Arms/Hands: Both  Home Assistive Devices/Equipment Home Assistive Devices/Equipment: Wheelchair  Therapy Consults (therapy consults require a physician order) PT Evaluation Needed: No OT Evalulation Needed: No SLP Evaluation Needed: No Abuse/Neglect Assessment (Assessment to be complete while patient is alone) Abuse/Neglect Assessment Can Be Completed: Yes Physical Abuse: Denies Verbal Abuse: Yes, past (Comment) Sexual Abuse: Denies Exploitation of patient/patient's resources: Denies Self-Neglect: Denies Values / Beliefs Cultural Requests During Hospitalization: None Spiritual  Requests During Hospitalization: None Consults Spiritual Care Consult Needed: No Social Work Consult Needed: No Regulatory affairs officer (For Healthcare) Does Patient Have a Medical Advance Directive?: No Would patient like information on creating a medical advance directive?: No - Patient declined          Disposition: Dr.Cobos recommends geropsych hospitalization Disposition Initial Assessment Completed for this Encounter: Yes  This service was provided via telemedicine using a 2-way, interactive audio and Radiographer, therapeutic.  Names of all persons participating in this telemedicine service and their role in this encounter. Name: Brekyn Huntoon Role: pt  Name: Ignacia Marvel, lcsw Role: TTS    [place] 03/16/2019 6:34 PM
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
## 2019-03-16 NOTE — ED Notes (Signed)
Patient in room yelling out "let me out of this room!"-Monique,RN

## 2019-03-16 NOTE — Progress Notes (Signed)
Pt meets inpatient criteria per Dr. Parke Poisson. Referral information has been sent to the following hospitals for review:  [facility]   Disposition will continue to assist with inpatient placement needs.   Audree Camel, LCSW, [place] Disposition [place] [facility]/TTS [phone]

## 2019-03-16 NOTE — ED Notes (Signed)
Patient attempting to get out off bed. States she is ready to go home. Reoriented at this time.

## 2019-03-16 NOTE — ED Notes (Signed)
House Coverage called. Per [employer] there are no safety sitters available at this time.

## 2019-03-16 NOTE — ED Notes (Addendum)
Patient refusing meal at this time.  

## 2019-03-17 ENCOUNTER — Encounter: Payer: Self-pay | Admitting: Internal Medicine

## 2019-03-17 ENCOUNTER — Emergency Department (HOSPITAL_COMMUNITY): Payer: Medicare Other

## 2019-03-17 DIAGNOSIS — R300 Dysuria: Secondary | ICD-10-CM | POA: Insufficient documentation

## 2019-03-17 LAB — CBG MONITORING, ED
Glucose-Capillary: 118 mg/dL — ABNORMAL HIGH (ref 70–99)
Glucose-Capillary: 96 mg/dL (ref 70–99)

## 2019-03-17 LAB — SARS CORONAVIRUS 2 BY RT PCR (HOSPITAL ORDER, PERFORMED IN ~~LOC~~ HOSPITAL LAB): SARS Coronavirus 2: NEGATIVE

## 2019-03-17 LAB — HEMOGLOBIN A1C: Hemoglobin A1C: 6.2

## 2019-03-17 MED ORDER — LORAZEPAM 2 MG/ML IJ SOLN
1.0000 mg | Freq: Once | INTRAMUSCULAR | Status: AC
  Administered 2019-03-17: 09:00:00 1 mg via INTRAMUSCULAR
  Filled 2019-03-17: qty 1

## 2019-03-17 MED ORDER — HALOPERIDOL LACTATE 5 MG/ML IJ SOLN
2.5000 mg | Freq: Once | INTRAMUSCULAR | Status: AC
  Administered 2019-03-17: 11:00:00 2.5 mg via INTRAMUSCULAR
  Filled 2019-03-17: qty 1

## 2019-03-17 NOTE — ED Notes (Signed)
Pt visual hallucination. Husband( deceased) sitting in the room with her. Their having a conversation.

## 2019-03-17 NOTE — Progress Notes (Signed)
Pt accepted to [facility] Geanie Kenning, MD is the accepting/attending provider.  Call report to [phone]  [facility] ED notified.   Pt is IVC .  Pt may be transported by [employer] Pt scheduled  to arrive at [facility] as soon as transport can be arranged.  Areatha Keas. Judi Cong, MSW, [place] Disposition Clinical Social Work [phone] (cell) [phone] (office)

## 2019-03-17 NOTE — ED Notes (Signed)
Pt becoming increasingly agitated, combative. Now cupping hands around face and screaming at the top of her lungs, doing so apparently to irritate staff. Encouraged to display appropriate behavior, refuses. Pt demanding to see family members, when informed they are not here looks at RN and says, "You'd better find them, or you'll be sacrificed for them."  Pt attempting to get out of bed by swinging legs over the side of the bed and when this RN attempts to reposition feet, pt looks at her foot and says, "into the stomach it goes."  Haldol 2.5mg  ordered and given. During administration pt kicked, sratched and drew blood on this RN's arm, and tried to bite Therapist, sports.  Will continue to monitor.

## 2019-03-17 NOTE — ED Notes (Signed)
Pt resting comfortably at this time.

## 2019-03-17 NOTE — ED Notes (Signed)
Pt crying out again, able to be redirected by sitter at this time

## 2019-03-17 NOTE — ED Provider Notes (Signed)
Emergency Medicine Observation Re-evaluation Note  [name] is a 85 y.o. female, seen on rounds today.  Pt initially presented to the ED for complaints of Psychiatric Evaluation Currently, the patient is sitting in bed without complaints. States she slept well. She did have purposeful incontinence of urine overnight. Stating to nursing staff "Figured you'd like the smell." Nursing states that she had been trying to get out of bed overnight however has been able to be redirected.  Physical Exam  BP 138/71 (BP Location: Left Arm)   Pulse 87   Temp 97.6 F (36.4 C) (Oral)   Resp 16   SpO2 96%  Physical Exam Vitals signs and nursing note reviewed.  Constitutional:      General: She is not in acute distress.    Appearance: She is not ill-appearing, toxic-appearing or diaphoretic.  HENT:     Head: Normocephalic and atraumatic.     Jaw: No trismus.  Neck:     Musculoskeletal: Normal range of motion.     Trachea: Phonation normal.     Comments: Moves neck spontaneously without difficulty. Cardiovascular:     Rate and Rhythm: Normal rate.  Pulmonary:     Effort: Pulmonary effort is normal. No tachypnea, bradypnea, accessory muscle usage, respiratory distress or retractions.     Breath sounds: Normal air entry.  Musculoskeletal:     Comments: Moves all 4 extremities without difficulty.  Neurological:     General: No focal deficit present.     Mental Status: She is alert.     Comments: No facial droop. CN 2-12 grossly intact. No dysphagia.  Psychiatric:        Attention and Perception: Attention normal.        Mood and Affect: Mood is depressed.        Speech: Speech normal.     Comments: Normal speech and behavior. No active hallucinations at this time. Endorses feelings of depression however cannot tell me why she feels depressed.    ED Course / MDM  HYI:FOYD   I have reviewed the labs performed to date as well as medications administered while in observation.  Recent  changes in the last 24 hours include intermittent hallucinations.  Today's Vitals   03/16/19 1530 03/16/19 1734 03/16/19 2208 03/17/19 0600  BP: (!) 142/79 133/74 140/73 138/71  Pulse: 92 88 90 87  Resp:  16 16 16   Temp:   98.7 F (37.1 C) 97.6 F (36.4 C)  TempSrc:   Oral Oral  SpO2: 95% 97% 95% 96%  PainSc:       There is no height or weight on file to calculate BMI. Plan  Current plan is for inpatient geripsych placement. Patient is under full IVC at this time. Patient sitting in bed without complaints on evaluation. Moves all 4 extremities without difficulty. She has been trying to get out of bed with intermittent hallucinations over night however was able to be redirected. VS have remained stable over night. Will continue to monitor   Conrado Nance A, PA-C 03/17/19 [place], [place], DO 03/17/19 [phone]

## 2019-03-17 NOTE — ED Notes (Signed)
Pt screaming with look of fear on face pointing inconsolable. Ativan given, tolerated with assistance from sitter and by singing

## 2019-03-17 NOTE — Progress Notes (Signed)
[facility] is reviewing patient information for possible admission. Test results for EKG, Chest X-ray, COVID-19 and IVC faxed to [facility].  Areatha Keas. Judi Cong, MSW, [place] Disposition Clinical Social Work [phone] (cell) [phone] (office)

## 2019-03-17 NOTE — ED Provider Notes (Signed)
IVC filled.   Lennice Sites, DO 03/17/19 [phone]

## 2019-03-17 NOTE — ED Provider Notes (Addendum)
Patient with increased agitation and trying to get out of bed. Unable to be redirected. Has been given Ativan 1 mg. Patient continues to be agitated. EKG obtained with QTc at 502. Discussed with attending provider Dr. Ronnald Nian who recommend Haldol 2.5 mg IM.  On reevaluation patient resting in bed quietly. No further agitation. Will continue to monitor.  Patient transferred to Dr. Ronnald Ramp.  Stable throughout my care.  Transferred for mental health care.   [place], Britni A, PA-C 03/17/19 1345    Lennice Sites, DO 03/17/19 [place], [place], DO 03/17/19 1547

## 2019-03-30 ENCOUNTER — Other Ambulatory Visit: Payer: Self-pay | Admitting: Internal Medicine

## 2019-03-30 MED ORDER — ALPRAZOLAM 0.5 MG PO TABS: 0.5000 mg | ORAL_TABLET | Freq: Two times a day (BID) | ORAL | 2 refills | Status: DC

## 2019-03-31 ENCOUNTER — Non-Acute Institutional Stay (SKILLED_NURSING_FACILITY): Payer: Medicare Other | Admitting: Internal Medicine

## 2019-03-31 ENCOUNTER — Encounter: Payer: Self-pay | Admitting: Internal Medicine

## 2019-03-31 DIAGNOSIS — F32A Depression, unspecified: Secondary | ICD-10-CM

## 2019-03-31 DIAGNOSIS — E1169 Type 2 diabetes mellitus with other specified complication: Secondary | ICD-10-CM

## 2019-03-31 DIAGNOSIS — E1149 Type 2 diabetes mellitus with other diabetic neurological complication: Secondary | ICD-10-CM

## 2019-03-31 DIAGNOSIS — G20 Parkinson's disease: Secondary | ICD-10-CM

## 2019-03-31 DIAGNOSIS — F02818 Dementia in other diseases classified elsewhere, unspecified severity, with other behavioral disturbance: Secondary | ICD-10-CM

## 2019-03-31 DIAGNOSIS — E785 Hyperlipidemia, unspecified: Secondary | ICD-10-CM

## 2019-03-31 DIAGNOSIS — F329 Major depressive disorder, single episode, unspecified: Secondary | ICD-10-CM

## 2019-03-31 DIAGNOSIS — F0281 Dementia in other diseases classified elsewhere with behavioral disturbance: Secondary | ICD-10-CM

## 2019-03-31 DIAGNOSIS — I10 Essential (primary) hypertension: Secondary | ICD-10-CM

## 2019-03-31 NOTE — Progress Notes (Signed)
: Provider:  Hennie Duos, MD Location:  [place] Room Number: 433-I Place of Service:  SNF ([phone])  PCP: Hennie Duos, MD [name], MD as PCP - General (Internal Medicine) Jasmine Pearson, MD as Referring Physician (Internal Medicine)  Extended Emergency Contact Information Primary Emergency Contact: [name] Secondary Emergency Contact: [name]     Allergies: Comtan [entacapone]; Mirapex [pramipexole dihydrochloride]; Sulfa antibiotics; Ciprofloxacin; and Seroquel [quetiapine fumarate]  Chief Complaint  Patient presents with  . Readmit To SNF    Readmission to [facility] SNF    HPI: Patient is 85 y.o. female with depression, generalized anxiety, psychosis with Parkinson's disease who presented to [facility] emergency department from SNF with reports of acting out physically with staff and accusing them of poisoning then, hallucinations of her deceased husband, verbally abusive, threatening to cut staff scratching staff hitting staff with her wheelchair and trying to leave the facility.  Patient talked about Jesus and the devil in 1 breath.  Refused her meds and was given Ativan IM in the emergency department for agitation and then 2.5 mg of Haldol IM.  She believes that staff is poisoning her and thinking of ways to kill her.  Patient was admitted to [facility] psychiatric ward where she show signs of moderate dementia confusion disorientation and falls patient had failed Seroquel and was started on Zyprexa which she tolerated well.  Her Depakote was increased to 250 3 times daily and BuSpar was increased to 10 mg twice daily.  Patient appearing back to baseline she is admitted back to SNF for residential care.  While at SNF patient will be followed for Parkinson's treated with Sinemet, hypertension treated  with Cardizem Cozaar and diabetes mellitus treated with metformin.  Past Medical History:  Diagnosis Date  . Abnormality of gait 02/05/2015  . Anxiety   . Arthritis   . Asthma   . Cerebrovascular disease    CVA '03  . Depression   . Dyslipidemia   . Endometriosis   . GERD (gastroesophageal reflux disease)   . Glaucoma   . Headache(784.0)   . Hypertension   . Macular degeneration    bilateral  . Memory disorder 11/23/2018  . Obesity   . Paralysis agitans ([place]) 08/31/2013  . Parkinson disease ([place])   . TIA (transient ischemic attack)    Multiple    Past Surgical History:  Procedure Laterality Date  . ABDOMINAL HYSTERECTOMY    . CATARACT EXTRACTION, BILATERAL    . GALLBLADDER SURGERY    . LOOP RECORDER IMPLANT  05-28-14   MDT LINQ implanted by Dr Caryl Comes for cryptogenic stroke/palpitations  . LOOP RECORDER IMPLANT N/A 05/28/2014   Procedure: LOOP RECORDER IMPLANT;  Surgeon: Deboraha Sprang, MD;  Location: [facility] CATH LAB;  Service: Cardiovascular;  Laterality: N/A;  . PILONIDAL CYST RESECTION    . TILT TABLE STUDY  05-28-14   orthostatic hypertension  . TILT TABLE STUDY N/A 05/28/2014   Procedure: TILT TABLE STUDY;  Surgeon: Deboraha Sprang, MD;  Location: [facility] CATH LAB;  Service: Cardiovascular;  Laterality: N/A;  . TONSILLECTOMY      Allergies as of 03/31/2019      Reactions   Comtan [entacapone] Other (See Comments)   "Allergic," per [facility]   Mirapex [pramipexole Dihydrochloride] Other (See Comments)   "Allergic," per [facility]   Sulfa Antibiotics Nausea And Vomiting   Ciprofloxacin Other (  See Comments)   Made the patient "feel crazy" and "Allergic," per [facility]   Seroquel [quetiapine Fumarate] Other (See Comments)   Excessive sleepiness      Medication List       Accurate as of Mar 31, 2019  8:46 AM. If you have any questions, ask your nurse or doctor.        STOP taking these medications   escitalopram 5 MG tablet Commonly known as:  LEXAPRO Stopped by:  Inocencio Homes, MD    guaiFENesin 100 MG/5ML liquid Commonly known as:  ROBITUSSIN Stopped by:  Inocencio Homes, MD   Melatonin 3 MG Caps Stopped by:  Inocencio Homes, MD   OVER THE COUNTER MEDICATION Stopped by:  Inocencio Homes, MD   Tab-A-Vite Tabs Stopped by:  Inocencio Homes, MD   Travatan Z 0.004 % Soln ophthalmic solution Generic drug:  Travoprost (BAK Free) Stopped by:  Inocencio Homes, MD     TAKE these medications   albuterol 108 (90 Base) MCG/ACT inhaler Commonly known as:  VENTOLIN HFA Inhale 2 puffs into the lungs every 6 (six) hours as needed for wheezing or shortness of breath.   ALPRAZolam 0.5 MG tablet Commonly known as:  XANAX Take 0.5 mg by mouth 3 (three) times daily. What changed:  Another medication with the same name was removed. Continue taking this medication, and follow the directions you see here. Changed by:  Inocencio Homes, MD   ALPRAZolam 0.25 MG tablet Commonly known as:  XANAX Take 0.25 mg by mouth 2 (two) times daily. What changed:  Another medication with the same name was removed. Continue taking this medication, and follow the directions you see here. Changed by:  Inocencio Homes, MD   atorvastatin 10 MG tablet Commonly known as:  LIPITOR Take 10 mg by mouth at bedtime.   bisacodyl 10 MG suppository Commonly known as:  DULCOLAX Place 10 mg rectally daily as needed (for constipation not relieved by milk of magnesia).   busPIRone 10 MG tablet Commonly known as:  BUSPAR Take 10 mg by mouth 2 (two) times daily. What changed:  Another medication with the same name was removed. Continue taking this medication, and follow the directions you see here. Changed by:  Inocencio Homes, MD   carbidopa-levodopa 25-100 MG tablet Commonly known as:  SINEMET IR Take 1 tablet by mouth every 8 (eight) hours.   clopidogrel 75 MG tablet Commonly known as:  PLAVIX Take 75 mg by mouth daily at 12 noon.   diltiazem 60 MG tablet Commonly known as:  CARDIZEM Take 60 mg by  mouth every 6 (six) hours.   divalproex 125 MG capsule Commonly known as:  DEPAKOTE SPRINKLE Take 125 mg by mouth 3 (three) times daily.   DULoxetine 60 MG capsule Commonly known as:  CYMBALTA Take 60 mg by mouth daily.   losartan 25 MG tablet Commonly known as:  COZAAR Take 25 mg by mouth at bedtime.   magnesium hydroxide 400 MG/5ML suspension Commonly known as:  MILK OF MAGNESIA Take 30 mLs by mouth daily as needed (if no bowel movement in 3 days).   metFORMIN 500 MG tablet Commonly known as:  GLUCOPHAGE Take 500 mg by mouth 2 (two) times daily before a meal.   montelukast 10 MG tablet Commonly known as:  SINGULAIR Take 10 mg by mouth daily at 12 noon.   Nutritional Supplement Liqd Take 4 oz by mouth daily. MedPass   OLANZapine 10 MG tablet Commonly known as:  ZYPREXA Take 10  mg by mouth at bedtime.   pantoprazole 40 MG tablet Commonly known as:  PROTONIX Take 40 mg by mouth daily before breakfast.   RA SALINE ENEMA RE Place 1 enema rectally daily as needed (for constipation not relieved by Dulcolax suppository and contact MD if no relief from enema).   traZODone 50 MG tablet Commonly known as:  DESYREL Take 50 mg by mouth at bedtime as needed for sleep.       No orders of the defined types were placed in this encounter.   Immunization History  Administered Date(s) Administered  . Influenza-Unspecified 09/06/2018    Social History   Tobacco Use  . Smoking status: Never Smoker  . Smokeless tobacco: Never Used  Substance Use Topics  . Alcohol use: No    Family history is   Family History  Problem Relation Age of Onset  . Heart attack Mother   . Arthritis Brother   . Skin cancer Brother       Review of Systems  DATA OBTAINED: from patient GENERAL:  no fevers, fatigue, appetite changes SKIN: No itching, or rash EYES: No eye pain, redness, discharge EARS: No earache, tinnitus, change in hearing NOSE: No congestion, drainage or bleeding   MOUTH/THROAT: No mouth or tooth pain, No sore throat RESPIRATORY: No cough, wheezing, SOB CARDIAC: No chest pain, palpitations, lower extremity edema  GI: No abdominal pain, No N/V/D or constipation, No heartburn or reflux  GU: No dysuria, frequency or urgency, or incontinence  MUSCULOSKELETAL: No unrelieved bone/joint pain NEUROLOGIC: No headache, dizziness or focal weakness PSYCHIATRIC: No c/o anxiety or sadness   Vitals:   03/31/19 0824  BP: 110/67  Pulse: 74  Resp: 18  Temp: (!) 96.8 F (36 C)  SpO2: 95%    SpO2 Readings from Last 1 Encounters:  03/31/19 95%   Body mass index is 35.51 kg/m.     Physical Exam  GENERAL APPEARANCE: Alert, conversant,  No acute distress.  SKIN: No diaphoresis rash HEAD: Normocephalic, atraumatic  EYES: Conjunctiva/lids clear. Pupils round, reactive. EOMs intact.  EARS: External exam WNL, canals clear. Hearing grossly normal.  NOSE: No deformity or discharge.  MOUTH/THROAT: Lips w/o lesions  RESPIRATORY: Breathing is even, unlabored. Lung sounds are clear   CARDIOVASCULAR: Heart RRR no murmurs, rubs or gallops. No peripheral edema.   GASTROINTESTINAL: Abdomen is soft, non-tender, not distended w/ normal bowel sounds. GENITOURINARY: Bladder non tender, not distended  MUSCULOSKELETAL: No abnormal joints or musculature NEUROLOGIC:  Cranial nerves 2-12 grossly intact. Moves all extremities  PSYCHIATRIC: Mood and affect appropriate to situation, no behavioral issues  Patient Active Problem List   Diagnosis Date Noted  . Dysuria 03/17/2019  . Dementia with behavioral problem ([place]) 02/18/2019  . Chronic generalized pain 01/29/2019  . Chronic recurrent major depressive disorder ([place]) 01/29/2019  . Increased intraocular pressure, bilateral 01/29/2019  . Dysphagia 12/11/2018  . Simple chronic bronchitis ([place]) 12/11/2018  . Insomnia 12/02/2018  . Memory disorder 11/23/2018  . GERD (gastroesophageal reflux disease) 09/05/2018  . Type 2  diabetes mellitus with neurological manifestations, controlled ([place]) 07/03/2018  . Depression 07/03/2018  . Essential hypertension   . Hx of aspiration pneumonitis 06/17/2018  . Abnormality of gait 02/05/2015  . Palpitations 05/28/2014  . Cerebrovascular disease 05/25/2014  . Hyperlipidemia associated with type 2 diabetes mellitus ([place]) 05/25/2014  . Parkinson disease ([place]) 05/25/2014  . H/O: CVA (cerebrovascular accident) 05/25/2014  . Paralysis agitans ([place]) 08/31/2013  . Dizziness and giddiness 08/31/2013  Labs reviewed: Basic Metabolic Panel:    Component Value Date/Time   NA 141 03/16/2019 1534   NA 143 08/15/2018   K 3.5 03/16/2019 1534   CL 102 03/16/2019 1534   CO2 27 03/16/2019 1534   GLUCOSE 150 (H) 03/16/2019 1534   BUN 24 (H) 03/16/2019 1534   BUN 24 (A) 08/15/2018   CREATININE 1.03 (H) 03/16/2019 1534   CALCIUM 9.8 03/16/2019 1534   PROT 6.5 03/16/2019 1534   ALBUMIN 3.2 (L) 03/16/2019 1534   AST 20 03/16/2019 1534   ALT 14 03/16/2019 1534   ALKPHOS 81 03/16/2019 1534   BILITOT 0.4 03/16/2019 1534   GFRNONAA 49 (L) 03/16/2019 1534   GFRAA 57 (L) 03/16/2019 1534    Recent Labs    06/19/18 0524 06/20/18 0541 08/08/18 08/15/18 03/16/19 1534  NA 141 140 142 143 141  K 3.6 3.7 4.0 4.0 3.5  CL 102 101  --   --  102  CO2 30 29  --   --  27  GLUCOSE 123* 109*  --   --  150*  BUN 20 19 20  24* 24*  CREATININE 0.80 0.79 0.7 0.7 1.03*  CALCIUM 10.0 10.2  --   --  9.8   Liver Function Tests: Recent Labs    06/17/18 1554 08/08/18 03/16/19 1534  AST 34 22 20  ALT 26 26 14   ALKPHOS 71 91 81  BILITOT 0.8  --  0.4  PROT 7.4  --  6.5  ALBUMIN 3.9  --  3.2*   No results for input(s): LIPASE, AMYLASE in the last 8760 hours. No results for input(s): AMMONIA in the last 8760 hours. CBC: Recent Labs    06/17/18 1554  06/19/18 0524 06/20/18 0541 08/08/18 03/16/19 1534  WBC 13.7*   < > 7.3 7.3 8.5  8.5 10.8*  NEUTROABS 10.7*  --  3.8 3.5  --   --    HGB 12.9   < > 11.3* 12.2 11.4*  11.4* 11.8*  HCT 39.9   < > 35.3* 38.0 34*  34* 36.6  MCV 93.0   < > 92.4 92.9  --  91.7  PLT 315   < > 330 373 354  354 348   < > = values in this interval not displayed.   Lipid Recent Labs    08/08/18  CHOL 167  HDL 41  LDLCALC 71  TRIG 277*    Cardiac Enzymes: No results for input(s): CKTOTAL, CKMB, CKMBINDEX, TROPONINI in the last 8760 hours. BNP: No results for input(s): BNP in the last 8760 hours. No results found for: [facility] Lab Results  Component Value Date   HGBA1C 6.2 (A) 08/08/2018   Lab Results  Component Value Date   TSH 3.09 08/08/2018   Lab Results  Component Value Date   [id number] 643 05/26/2014   No results found for: FOLATE No results found for: IRON, TIBC, FERRITIN  Imaging and Procedures obtained prior to SNF admission: Dg Chest Portable 1 View  Result Date: 03/17/2019 CLINICAL DATA:  Behavioral health admission. EXAM: PORTABLE CHEST 1 VIEW COMPARISON:  06/18/2018 FINDINGS: Loop recorder in place. Chronic left ventricular prominence and aortic atherosclerosis. Poor inspiration. Allowing for that, no evidence active heart failure, infiltrate, collapse or effusion. Chronic right humeral fracture. IMPRESSION: Poor inspiration.  No active disease suspected. Electronically Signed   By: Nelson Chimes M.D.   On: 03/17/2019 10:19     Not all labs, radiology exams or other studies done during hospitalization come  through on my EPIC note; however they are reviewed by me.    Assessment and Plan  Parkinson's disease/Parkinson psychosis/dementia with behaviors- patient failed Seroquel secondary to extremely excessive sleepiness, was started on Zyprexa at bedtime which she tolerated well.  Depakote was increased to 250 mg 3 times daily and BuSpar was increased to 10 mg twice daily SNF- admitted back for residential care; continue Zyprexa 10 mg at bedtime with 2.5 mg p.o. as needed, continue Depakote 250 mg 3 times daily,  BuSpar 10 mg twice daily, Xanax 0.25 mg twice daily; continue usual Sinemet 25- 101 p.o. 3 times daily  Hypertension SNF-continue diltiazem 60 mg 4 times daily and losartan 25 mg nightly  Diabetes mellitus SNF-continue Glucophage 500 mg twice daily  Depression SNF-continue 60 mg daily of Cymbalta    Time spent greater than 35 minutes;> 50% of time with patient was spent reviewing records, labs, tests and studies, counseling and developing plan of care  Webb Silversmith D. [name], MD

## 2019-04-02 ENCOUNTER — Encounter: Payer: Self-pay | Admitting: Internal Medicine

## 2019-04-02 DIAGNOSIS — G20A1 Parkinson's disease without dyskinesia, without mention of fluctuations: Secondary | ICD-10-CM | POA: Insufficient documentation

## 2019-04-02 DIAGNOSIS — G20 Parkinson's disease: Secondary | ICD-10-CM | POA: Insufficient documentation

## 2019-04-27 ENCOUNTER — Other Ambulatory Visit: Payer: Self-pay | Admitting: Internal Medicine

## 2019-04-27 MED ORDER — ALPRAZOLAM 0.25 MG PO TABS: 0.2500 mg | ORAL_TABLET | Freq: Two times a day (BID) | ORAL | 1 refills | Status: AC

## 2019-04-28 ENCOUNTER — Non-Acute Institutional Stay (SKILLED_NURSING_FACILITY): Payer: Medicare Other | Admitting: Internal Medicine

## 2019-04-28 ENCOUNTER — Encounter: Payer: Self-pay | Admitting: Internal Medicine

## 2019-04-28 DIAGNOSIS — R031 Nonspecific low blood-pressure reading: Secondary | ICD-10-CM | POA: Diagnosis not present

## 2019-04-28 DIAGNOSIS — F329 Major depressive disorder, single episode, unspecified: Secondary | ICD-10-CM | POA: Diagnosis not present

## 2019-04-28 DIAGNOSIS — R17 Unspecified jaundice: Secondary | ICD-10-CM

## 2019-04-28 DIAGNOSIS — F32A Depression, unspecified: Secondary | ICD-10-CM

## 2019-04-28 NOTE — Progress Notes (Addendum)
Location:  [facility] Room Number: 306-W Place of Service:  SNF (31)  Jasmine Duos, MD  Patient Care Team: Jasmine Duos, MD as PCP - General (Internal Medicine) Marylynn Pearson, MD as Referring Physician (Internal Medicine)  Extended Emergency Contact Information Primary Emergency Contact: [facility] Mobile Phone: [phone] Relation: Daughter Secondary Emergency Contact: [place] Mobile Phone: [phone] Relation: Son    Allergies: Comtan [entacapone], Mirapex [pramipexole dihydrochloride], Sulfa antibiotics, Ciprofloxacin, and Seroquel [quetiapine fumarate]  Chief Complaint  Patient presents with  . Acute Visit    HPI: Patient is an 85 y.o. female who nursing asked me to see because she had blood pressure in the 90s this morning and felt faint and was saying that it is time for her to go.  Patient is not suicidal she is just like everyone in the skilled nursing facility she is bored and has nothing to do and cannot see family and is depressed.  My visit with her this afternoon she is awake and talking to me like she normally does she does not have any suicidal ideation she does say that she is not hungry and she is nauseated and I asked her if she wanted to just drink her meals and she said yes. I noticed her skin had a yellow hue, she denied abdominal pain and I didn't feel a liver edge and and there was no discomfort with palpation.  Past Medical History:  Diagnosis Date  . Abnormality of gait 02/05/2015  . Anxiety   . Arthritis   . Asthma   . Cerebrovascular disease    CVA '03  . Depression   . Dyslipidemia   . Endometriosis   . GERD (gastroesophageal reflux disease)   . Glaucoma   . Headache(784.0)   . Hypertension   . Macular degeneration    bilateral  . Memory disorder 11/23/2018  . Obesity   . Paralysis agitans ([place]) 08/31/2013  . Parkinson disease ([place])   . TIA (transient ischemic attack)    Multiple    Past Surgical History:  Procedure Laterality Date  . ABDOMINAL HYSTERECTOMY    . CATARACT EXTRACTION, BILATERAL    . GALLBLADDER SURGERY    . LOOP RECORDER IMPLANT  05-28-14   MDT LINQ implanted by Dr Caryl Comes for cryptogenic stroke/palpitations  . LOOP RECORDER IMPLANT N/A 05/28/2014   Procedure: LOOP RECORDER IMPLANT;  Surgeon: Deboraha Sprang, MD;  Location: [facility] CATH LAB;  Service: Cardiovascular;  Laterality: N/A;  . PILONIDAL CYST RESECTION    . TILT TABLE STUDY  05-28-14   orthostatic hypertension  . TILT TABLE STUDY N/A 05/28/2014   Procedure: TILT TABLE STUDY;  Surgeon: Deboraha Sprang, MD;  Location: [facility] CATH LAB;  Service: Cardiovascular;  Laterality: N/A;  . TONSILLECTOMY      Allergies as of 04/28/2019      Reactions   Comtan [entacapone] Other (See Comments)   "Allergic," per [facility]   Mirapex [pramipexole Dihydrochloride] Other (See Comments)   "Allergic," per [facility]   Sulfa Antibiotics Nausea And Vomiting   Ciprofloxacin Other (See Comments)   Made the patient "feel crazy" and "Allergic," per [facility]   Seroquel [quetiapine Fumarate] Other (See Comments)   Excessive sleepiness      Medication List       Accurate as of April 28, 2019 12:21 PM. If you have any questions, ask your nurse or doctor.        STOP taking these medications   albuterol 108 (90  Base) MCG/ACT inhaler Commonly known as: VENTOLIN HFA Stopped by: Inocencio Homes, MD   traZODone 50 MG tablet Commonly known as: DESYREL Stopped by: Inocencio Homes, MD     TAKE these medications   ALPRAZolam 0.25 MG tablet Commonly known as: XANAX Take 1 tablet (0.25 mg total) by mouth 2 (two) times daily. What changed: Another medication with the same name was removed. Continue taking this medication, and follow the directions you see here. Changed by: Inocencio Homes, MD   atorvastatin 10 MG tablet Commonly known as: LIPITOR Take 10 mg by mouth at bedtime.   bisacodyl 10 MG suppository Commonly known as: DULCOLAX Place  10 mg rectally daily as needed (for constipation not relieved by milk of magnesia).   busPIRone 10 MG tablet Commonly known as: BUSPAR Take 10 mg by mouth 2 (two) times daily.   carbidopa-levodopa 25-100 MG tablet Commonly known as: SINEMET IR Take 1 tablet by mouth every 8 (eight) hours.   clopidogrel 75 MG tablet Commonly known as: PLAVIX Take 75 mg by mouth daily at 12 noon.   diltiazem 60 MG tablet Commonly known as: CARDIZEM Take 60 mg by mouth every 6 (six) hours.   divalproex 125 MG capsule Commonly known as: DEPAKOTE SPRINKLE Take 125 mg by mouth 3 (three) times daily.   DULoxetine 60 MG capsule Commonly known as: CYMBALTA Take 60 mg by mouth daily.   losartan 25 MG tablet Commonly known as: COZAAR Take 25 mg by mouth at bedtime.   magnesium hydroxide 400 MG/5ML suspension Commonly known as: MILK OF MAGNESIA Take 30 mLs by mouth daily as needed (if no bowel movement in 3 days).   metFORMIN 500 MG tablet Commonly known as: GLUCOPHAGE Take 500 mg by mouth 2 (two) times daily before a meal.   montelukast 10 MG tablet Commonly known as: SINGULAIR Take 10 mg by mouth daily at 12 noon.   Nutritional Supplement Liqd Take 4 oz by mouth daily. Med[employer]   OLANZapine 10 MG tablet Commonly known as: ZYPREXA Take 10 mg by mouth at bedtime.   pantoprazole 40 MG tablet Commonly known as: PROTONIX Take 40 mg by mouth daily before breakfast.   RA SALINE ENEMA RE Place 1 enema rectally daily as needed (for constipation not relieved by Dulcolax suppository and contact MD if no relief from enema).       No orders of the defined types were placed in this encounter.   Immunization History  Administered Date(s) Administered  . Influenza Split 07/08/2018  . Influenza-Unspecified 09/06/2018    Social History   Tobacco Use  . Smoking status: Never Smoker  . Smokeless tobacco: Never Used  Substance Use Topics  . Alcohol use: No    Review of Systems  DATA  OBTAINED: from patient, nurse GENERAL:  no fevers, fatigue, +appetite changes SKIN: No itching, rash HEENT: No complaint RESPIRATORY: No cough, wheezing, SOB CARDIAC: No chest pain, palpitations, lower extremity edema  GI: No abdominal pain, No N/V/D or constipation, No heartburn or reflux  GU: No dysuria, frequency or urgency, or incontinence  MUSCULOSKELETAL: No unrelieved bone/joint pain NEUROLOGIC: No headache, dizziness earlier today PSYCHIATRIC: No overt anxiety or sadness  Vitals:   04/28/19 0847  BP: 105/61  Pulse: 92  Resp: 18  Temp: (!) 97.5 F (36.4 C)  SpO2: 99%   Body mass index is 33.71 kg/m. Physical Exam  GENERAL APPEARANCE: Alert, conversant, No acute distress  SKIN: yellow hue HEENT: Unremarkable; no icterus RESPIRATORY: Breathing is even, unlabored.  Lung sounds are clear   CARDIOVASCULAR: Heart RRR no murmurs, rubs or gallops. No peripheral edema  GASTROINTESTINAL: Abdomen is soft, non-tender, not distended w/ normal bowel sounds; liver edge not appteciated, no discomfort.  GENITOURINARY: Bladder non tender, not distended  MUSCULOSKELETAL: No abnormal joints or musculature NEUROLOGIC: Cranial nerves 2-12 grossly intact. Moves all extremities PSYCHIATRIC: Mood and affect appropriate to situation with some flat affect, no behavioral issues  Patient Active Problem List   Diagnosis Date Noted  . 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 Dysuria 03/17/2019  . Dementia with behavioral problem ([place]) 02/18/2019  . Chronic generalized pain 01/29/2019  . Chronic recurrent major depressive disorder ([place]) 01/29/2019  . Increased intraocular pressure, bilateral 01/29/2019  . Dysphagia 12/11/2018  . Simple chronic bronchitis ([place]) 12/11/2018  . Insomnia 12/02/2018  . Memory disorder 11/23/2018  . GERD (gastroesophageal reflux disease) 09/05/2018  . Type 2 diabetes mellitus with neurological manifestations, controlled ([place]) 07/03/2018  . Depression  07/03/2018  . Essential hypertension   . Hx of aspiration pneumonitis 06/17/2018  . Abnormality of gait 02/05/2015  . Palpitations 05/28/2014  . Cerebrovascular disease 05/25/2014  . Hyperlipidemia associated with type 2 diabetes mellitus ([place]) 05/25/2014  . Parkinson disease ([place]) 05/25/2014  . H/O: CVA (cerebrovascular accident) 05/25/2014  . Paralysis agitans ([place]) 08/31/2013  . Dizziness and giddiness 08/31/2013    CMP     Component Value Date/Time   NA 141 03/16/2019 1534   NA 143 08/15/2018   K 3.5 03/16/2019 1534   CL 102 03/16/2019 1534   CO2 27 03/16/2019 1534   GLUCOSE 150 (H) 03/16/2019 1534   BUN 24 (H) 03/16/2019 1534   BUN 24 (A) 08/15/2018   CREATININE 1.03 (H) 03/16/2019 1534   CALCIUM 9.8 03/16/2019 1534   PROT 6.5 03/16/2019 1534   ALBUMIN 3.2 (L) 03/16/2019 1534   AST 20 03/16/2019 1534   ALT 14 03/16/2019 1534   ALKPHOS 81 03/16/2019 1534   BILITOT 0.4 03/16/2019 1534   GFRNONAA 49 (L) 03/16/2019 1534   GFRAA 57 (L) 03/16/2019 1534   Recent Labs    06/19/18 0524 06/20/18 0541 08/08/18 08/15/18 03/16/19 1534  NA 141 140 142 143 141  K 3.6 3.7 4.0 4.0 3.5  CL 102 101  --   --  102  CO2 30 29  --   --  27  GLUCOSE 123* 109*  --   --  150*  BUN 20 19 20  24* 24*  CREATININE 0.80 0.79 0.7 0.7 1.03*  CALCIUM 10.0 10.2  --   --  9.8   Recent Labs    06/17/18 1554 08/08/18 03/16/19 1534  AST 34 22 20  ALT 26 26 14   ALKPHOS 71 91 81  BILITOT 0.8  --  0.4  PROT 7.4  --  6.5  ALBUMIN 3.9  --  3.2*   Recent Labs    06/17/18 1554  06/19/18 0524 06/20/18 0541 08/08/18 03/16/19 1534  WBC 13.7*   < > 7.3 7.3 8.5  8.5 10.8*  NEUTROABS 10.7*  --  3.8 3.5  --   --   HGB 12.9   < > 11.3* 12.2 11.4*  11.4* 11.8*  HCT 39.9   < > 35.3* 38.0 34*  34* 36.6  MCV 93.0   < > 92.4 92.9  --  91.7  PLT 315   < > 330 373 354  354 348   < > = values in this interval not displayed.  Recent Labs    08/08/18  CHOL 167  LDLCALC 71  TRIG 277*   No  results found for: [facility] Lab Results  Component Value Date   TSH 3.09 08/08/2018   Lab Results  Component Value Date   HGBA1C 6.2 03/17/2019   Lab Results  Component Value Date   CHOL 167 08/08/2018   HDL 41 08/08/2018   LDLCALC 71 08/08/2018   TRIG 277 (A) 08/08/2018   CHOLHDL 5.3 05/26/2014    Significant Diagnostic Results in last 30 days:  No results found.  Assessment and Plan  Low blood pressure- patient is on diltiazem 60 mg 4 times daily and losartan 25 mg daily; will decrease diltiazem to 30 mg 4 times daily and leave the losartan unchanged; will monitor response  Icterus of skin- have ordered a CMP and CBC  Depression- patient has been depressed but appears at baseline; patient's new antipsychotic, Zyprexa, is working well; will continue to monitor    Time spent with patient greater than 35 minutes Jasmine Duos, MD

## 2019-04-29 ENCOUNTER — Encounter: Payer: Self-pay | Admitting: Internal Medicine

## 2019-04-30 ENCOUNTER — Encounter: Payer: Self-pay | Admitting: Internal Medicine

## 2019-04-30 DIAGNOSIS — R031 Nonspecific low blood-pressure reading: Secondary | ICD-10-CM | POA: Insufficient documentation

## 2019-05-01 ENCOUNTER — Emergency Department (HOSPITAL_COMMUNITY): Payer: Medicare Other

## 2019-05-01 ENCOUNTER — Emergency Department (HOSPITAL_COMMUNITY)
Admission: EM | Admit: 2019-05-01 | Discharge: 2019-05-01 | Disposition: A | Discharge status: Home / Self Care | Payer: Medicare Other | Attending: Emergency Medicine | Admitting: Emergency Medicine

## 2019-05-01 ENCOUNTER — Encounter (HOSPITAL_COMMUNITY): Payer: Self-pay | Admitting: Radiology

## 2019-05-01 ENCOUNTER — Other Ambulatory Visit: Payer: Self-pay

## 2019-05-01 DIAGNOSIS — Z8673 Personal history of transient ischemic attack (TIA), and cerebral infarction without residual deficits: Secondary | ICD-10-CM | POA: Diagnosis not present

## 2019-05-01 DIAGNOSIS — Z20828 Contact with and (suspected) exposure to other viral communicable diseases: Secondary | ICD-10-CM | POA: Insufficient documentation

## 2019-05-01 DIAGNOSIS — G20 Parkinson's disease: Secondary | ICD-10-CM | POA: Diagnosis not present

## 2019-05-01 DIAGNOSIS — Z79899 Other long term (current) drug therapy: Secondary | ICD-10-CM | POA: Insufficient documentation

## 2019-05-01 DIAGNOSIS — Z7984 Long term (current) use of oral hypoglycemic drugs: Secondary | ICD-10-CM | POA: Diagnosis not present

## 2019-05-01 DIAGNOSIS — I10 Essential (primary) hypertension: Secondary | ICD-10-CM | POA: Insufficient documentation

## 2019-05-01 DIAGNOSIS — C259 Malignant neoplasm of pancreas, unspecified: Secondary | ICD-10-CM | POA: Insufficient documentation

## 2019-05-01 DIAGNOSIS — R17 Unspecified jaundice: Secondary | ICD-10-CM | POA: Diagnosis not present

## 2019-05-01 DIAGNOSIS — L299 Pruritus, unspecified: Secondary | ICD-10-CM | POA: Diagnosis present

## 2019-05-01 DIAGNOSIS — J45909 Unspecified asthma, uncomplicated: Secondary | ICD-10-CM | POA: Insufficient documentation

## 2019-05-01 LAB — CBC WITH DIFFERENTIAL/PLATELET
Abs Immature Granulocytes: 0.11 10*3/uL — ABNORMAL HIGH (ref 0.00–0.07)
Basophils Absolute: 0.1 10*3/uL (ref 0.0–0.1)
Basophils Relative: 1 %
Eosinophils Absolute: 0.3 10*3/uL (ref 0.0–0.5)
Eosinophils Relative: 3 %
HCT: 37.7 % (ref 36.0–46.0)
Hemoglobin: 12.1 g/dL (ref 12.0–15.0)
Immature Granulocytes: 1 %
Lymphocytes Relative: 18 %
Lymphs Abs: 2 10*3/uL (ref 0.7–4.0)
MCH: 30.4 pg (ref 26.0–34.0)
MCHC: 32.1 g/dL (ref 30.0–36.0)
MCV: 94.7 fL (ref 80.0–100.0)
Monocytes Absolute: 0.8 10*3/uL (ref 0.1–1.0)
Monocytes Relative: 7 %
Neutro Abs: 7.6 10*3/uL (ref 1.7–7.7)
Neutrophils Relative %: 70 %
Platelets: 382 10*3/uL (ref 150–400)
RBC: 3.98 MIL/uL (ref 3.87–5.11)
RDW: 15.9 % — ABNORMAL HIGH (ref 11.5–15.5)
WBC: 10.9 10*3/uL — ABNORMAL HIGH (ref 4.0–10.5)
nRBC: 0 % (ref 0.0–0.2)

## 2019-05-01 LAB — POCT I-STAT EG7
Acid-base deficit: 3 mmol/L — ABNORMAL HIGH (ref 0.0–2.0)
Bicarbonate: 22.5 mmol/L (ref 20.0–28.0)
Calcium, Ion: 1.27 mmol/L (ref 1.15–1.40)
HCT: 39 % (ref 36.0–46.0)
Hemoglobin: 13.3 g/dL (ref 12.0–15.0)
O2 Saturation: 85 %
Potassium: 3.5 mmol/L (ref 3.5–5.1)
Sodium: 138 mmol/L (ref 135–145)
TCO2: 24 mmol/L (ref 22–32)
pCO2, Ven: 39.8 mmHg — ABNORMAL LOW (ref 44.0–60.0)
pH, Ven: 7.361 (ref 7.250–7.430)
pO2, Ven: 52 mmHg — ABNORMAL HIGH (ref 32.0–45.0)

## 2019-05-01 LAB — COMPREHENSIVE METABOLIC PANEL
ALT: 111 U/L — ABNORMAL HIGH (ref 0–44)
AST: 472 U/L — ABNORMAL HIGH (ref 15–41)
Albumin: 2.4 g/dL — ABNORMAL LOW (ref 3.5–5.0)
Alkaline Phosphatase: 1519 U/L — ABNORMAL HIGH (ref 38–126)
Anion gap: 17 — ABNORMAL HIGH (ref 5–15)
BUN: 25 mg/dL — ABNORMAL HIGH (ref 8–23)
CO2: 17 mmol/L — ABNORMAL LOW (ref 22–32)
Calcium: 9.3 mg/dL (ref 8.9–10.3)
Chloride: 101 mmol/L (ref 98–111)
Creatinine, Ser: 0.8 mg/dL (ref 0.44–1.00)
GFR calc Af Amer: >60 mL/min (ref >60)
GFR calc non Af Amer: >60 mL/min (ref >60)
Glucose, Bld: 154 mg/dL — ABNORMAL HIGH (ref 70–99)
Potassium: 3.6 mmol/L (ref 3.5–5.1)
Sodium: 135 mmol/L (ref 135–145)
Total Bilirubin: 12.7 mg/dL — ABNORMAL HIGH (ref 0.3–1.2)
Total Protein: 7 g/dL (ref 6.5–8.1)

## 2019-05-01 LAB — PHOSPHORUS: Phosphorus: 4.6 mg/dL (ref 2.5–4.6)

## 2019-05-01 LAB — PROTIME-INR
INR: 1.1 (ref 0.8–1.2)
Prothrombin Time: 14.3 seconds (ref 11.4–15.2)

## 2019-05-01 LAB — VALPROIC ACID LEVEL: Valproic Acid Lvl: 36 ug/mL — ABNORMAL LOW (ref 50.0–100.0)

## 2019-05-01 LAB — LACTIC ACID, PLASMA
Lactic Acid, Venous: 3.3 mmol/L (ref 0.5–1.9)
Lactic Acid, Venous: 2.3 mmol/L (ref 0.5–1.9)

## 2019-05-01 LAB — SARS CORONAVIRUS 2 BY RT PCR (HOSPITAL ORDER, PERFORMED IN ~~LOC~~ HOSPITAL LAB): SARS Coronavirus 2: NEGATIVE

## 2019-05-01 LAB — TSH: TSH: 1.662 u[IU]/mL (ref 0.350–4.500)

## 2019-05-01 LAB — MAGNESIUM: Magnesium: 1.7 mg/dL (ref 1.7–2.4)

## 2019-05-01 LAB — AMMONIA: Ammonia: 73 umol/L — ABNORMAL HIGH (ref 9–35)

## 2019-05-01 LAB — LIPASE, BLOOD: Lipase: 130 U/L — ABNORMAL HIGH (ref 11–51)

## 2019-05-01 MED ORDER — SODIUM CHLORIDE 0.9 % IV SOLN
Freq: Once | INTRAVENOUS | Status: AC
  Administered 2019-05-01: 13:00:00 via INTRAVENOUS

## 2019-05-01 MED ORDER — IOHEXOL 300 MG/ML  SOLN
100.0000 mL | Freq: Once | INTRAMUSCULAR | Status: AC | PRN
  Administered 2019-05-01: 100 mL via INTRAVENOUS

## 2019-05-01 NOTE — ED Triage Notes (Signed)
Pt with generalized weakness, jaundice since Friday. PT awake, alert, oriented x4. 114/57, hr 92 SR, cbg 151. 20g left hand.

## 2019-05-01 NOTE — ED Notes (Signed)
Updated patients daughter Margarita Grizzle that patient had just arrived to ED. Please call her with results/dispo/plan

## 2019-05-01 NOTE — ED Provider Notes (Signed)
Prior to admission.  Decision was made between family, Dr. Lorin Mercy and patient to make transition to hospice.  Was arranged for hospice today and discharged to hospice.  This chart was dictated using voice recognition software.  Despite best efforts to proofread,  errors can occur which can change the documentation meaning.     Lennice Sites, DO 05/01/19 1850

## 2019-05-01 NOTE — ED Notes (Signed)
DNR form filled out by Dr. Lorin Mercy and will go with pt to [facility]

## 2019-05-01 NOTE — Consult Note (Signed)
Consult Note   [name] QIW:[id number] DOB: [birth year] DOA: 05/01/2019  PCP: Hennie Duos, MD Consultants:  Jannifer Franklin - neurology Patient coming from: [facility]; NOK: Daughter, [name], [phone]  Chief Complaint: Weakness and jaundice  HPI: [name] is a 85 y.o. female with medical history significant of CVA; Parkinson's; dementia; obesity (BMI 34); macular degeneration; and HLD presenting with weakness and jaundice.  She reports presenting because she is having trouble with her liver and she thought she would have to have it removed.  She turned yellow with severe itching.  Symptoms developed within the last week.  Intermittent abdominal pain.  +unintentional weight loss, about 70 pounds in maybe a month (?).  +anorexia.    She was in the ER on 5/14 for increasingly delusional behavior.  She was admitted to [facility], and readmitted to [facility] on 5/29.  The patient is A&O x 3 and prefers palliative care only.  Daughter acknowledges tumor in the pancreas causing jaundice.  Understands EGD with palliative stent but overall poor prognosis.  Her daughter does not think she would tolerate it.   ED Course:  New onset jaundice, elevated bilirubin to 12, elevated AP.  No h/o prior liver issues.  GI has been consulted.  CT with apparent pancreatic CA.  Review of Systems: As per HPI; otherwise review of systems reviewed and negative.   Ambulatory Status:  Ambulates with a walker  Past Medical History:  Diagnosis Date  . Abnormality of gait 02/05/2015  . Anxiety   . Arthritis   . Asthma   . Cerebrovascular disease    CVA '03  . Depression   . Dyslipidemia   . Endometriosis   . GERD (gastroesophageal reflux disease)   . Glaucoma   . Headache(784.0)   . Hypertension   . Macular degeneration    bilateral  . Memory disorder 11/23/2018  . Obesity   . Paralysis agitans ([place]) 08/31/2013  . Parkinson disease ([place])   . TIA (transient  ischemic attack)    Multiple    Past Surgical History:  Procedure Laterality Date  . ABDOMINAL HYSTERECTOMY    . CATARACT EXTRACTION, BILATERAL    . GALLBLADDER SURGERY    . LOOP RECORDER IMPLANT  05-28-14   MDT LINQ implanted by Dr Caryl Comes for cryptogenic stroke/palpitations  . LOOP RECORDER IMPLANT N/A 05/28/2014   Procedure: LOOP RECORDER IMPLANT;  Surgeon: Deboraha Sprang, MD;  Location: [facility] CATH LAB;  Service: Cardiovascular;  Laterality: N/A;  . PILONIDAL CYST RESECTION    . TILT TABLE STUDY  05-28-14   orthostatic hypertension  . TILT TABLE STUDY N/A 05/28/2014   Procedure: TILT TABLE STUDY;  Surgeon: Deboraha Sprang, MD;  Location: [facility] CATH LAB;  Service: Cardiovascular;  Laterality: N/A;  . TONSILLECTOMY      Social History   Socioeconomic History  . Marital status: Widowed    Spouse name: Not on file  . Number of children: 4  . Years of education: HS  . Highest education level: Not on file  Occupational History  . Occupation: Retired  Scientific laboratory technician  . Financial resource strain: Not hard at all  . Food insecurity    Worry: Never true    Inability: Never true  . Transportation needs    Medical: No    Non-medical: No  Tobacco Use  . Smoking status: Never Smoker  . Smokeless tobacco: Never Used  Substance and Sexual Activity  . Alcohol use: No  .  Drug use: No  . Sexual activity: Never    Birth control/protection: Other-see comments  Lifestyle  . Physical activity    Days per week: 0 days    Minutes per session: 0 min  . Stress: Not on file  Relationships  . Social Herbalist on phone: Not on file    Gets together: Not on file    Attends religious service: Not on file    Active member of club or organization: Not on file    Attends meetings of clubs or organizations: Not on file    Relationship status: Not on file  . Intimate partner violence    Fear of current or ex partner: Not on file    Emotionally abused: Not on file    Physically abused: Not on  file    Forced sexual activity: Not on file  Other Topics Concern  . Not on file  Social History Narrative   Lived at [facility] prior to admission to [facility]   Patient is right handed.   Caffeine use: Tea daily    Allergies  Allergen Reactions  . Comtan [Entacapone] Other (See Comments)    "Allergic," per MAR  . Mirapex [Pramipexole Dihydrochloride] Other (See Comments)    "Allergic," per MAR  . Sulfa Antibiotics Nausea And Vomiting  . Ciprofloxacin Other (See Comments)    Made the patient "feel crazy" and "Allergic," per MAR  . Seroquel [Quetiapine Fumarate] Other (See Comments)    Excessive sleepiness     Family History  Problem Relation Age of Onset  . Heart attack Mother   . Arthritis Brother   . Skin cancer Brother     Prior to Admission medications   Medication Sig Start Date End Date Taking? Authorizing Provider  ALPRAZolam (XANAX) 0.25 MG tablet Take 1 tablet (0.25 mg total) by mouth 2 (two) times daily. 04/27/19  Yes Hennie Duos, MD  atorvastatin (LIPITOR) 10 MG tablet Take 10 mg by mouth at bedtime.    Yes [provider]  bisacodyl (DULCOLAX) 10 MG suppository Place 10 mg rectally daily as needed (for constipation not relieved by milk of magnesia).    Yes [provider]  busPIRone (BUSPAR) 10 MG tablet Take 10 mg by mouth 2 (two) times daily.    Yes [provider]  carbidopa-levodopa (SINEMET IR) 25-100 MG tablet Take 1 tablet by mouth every 8 (eight) hours.    Yes [provider]  clopidogrel (PLAVIX) 75 MG tablet Take 75 mg by mouth daily at 12 noon.    Yes [provider]  diltiazem (CARDIZEM) 60 MG tablet Take 60 mg by mouth every 6 (six) hours.    Yes [provider]  divalproex (DEPAKOTE) 250 MG DR tablet Take 250 mg by mouth 3 (three) times daily.   Yes [provider]  DULoxetine (CYMBALTA) 60 MG capsule Take 60 mg by mouth daily.   Yes [provider]  losartan  (COZAAR) 25 MG tablet Take 25 mg by mouth at bedtime.    Yes [provider]  magnesium hydroxide (MILK OF MAGNESIA) 400 MG/5ML suspension Take 30 mLs by mouth daily as needed (if no bowel movement in 3 days).    Yes [provider]  metFORMIN (GLUCOPHAGE) 500 MG tablet Take 500 mg by mouth 2 (two) times daily before a meal.    Yes [provider]  montelukast (SINGULAIR) 10 MG tablet Take 10 mg by mouth daily at 12  noon.    Yes [provider]  Nutritional Supplement LIQD Take 4 oz by mouth daily. Med[employer]   Yes [provider]  OLANZapine (ZYPREXA) 10 MG tablet Take 10 mg by mouth at bedtime.   Yes [provider]  pantoprazole (PROTONIX) 40 MG tablet Take 40 mg by mouth daily before breakfast.   Yes [provider]  Sodium Phosphates (RA SALINE ENEMA RE) Place 1 enema rectally daily as needed (for constipation not relieved by Dulcolax suppository and contact MD if no relief from enema).    Yes [provider]    Physical Exam: Vitals:   05/01/19 1145 05/01/19 1245 05/01/19 1415 05/01/19 1500  BP: 122/62 120/61 128/64 129/61  Pulse: 78 77 79 76  Resp: (!) 25 (!) 25 20 (!) 24  Temp:      TempSrc:      SpO2: 95% 99% 98% 97%     . General:  Appears calm and comfortable and is NAD; she is extremely jaundiced . Eyes:  PERRL, EOMI, normal lids, iris, +scleral icterus . ENT:  grossly normal hearing, lips & tongue, mmm . Neck:  no LAD, masses or thyromegaly . Cardiovascular:  RRR, no m/r/g. No LE edema.  Marland Kitchen Respiratory:   CTA bilaterally with no wheezes/rales/rhonchi.  Mildly increased respiratory effort. . Abdomen:  soft, RUQ TTP, ND, NABS . Skin:  Very jaundiced . Musculoskeletal:  grossly normal tone BUE/BLE, good ROM, no bony abnormality . Lower extremity:  No LE edema.  Limited foot exam with no ulcerations.  2+ distal pulses. Marland Kitchen Psychiatric:  grossly normal mood and affect, speech fluent and appropriate, AOx3 .  Neurologic:  CN 2-12 grossly intact, moves all extremities in coordinated fashion, sensation intact    Radiological Exams on Admission: Dg Chest 1 View  Result Date: 05/01/2019 CLINICAL DATA:  Jaundice. EXAM: CHEST  1 VIEW COMPARISON:  Chest x-ray dated Mar 17, 2019. FINDINGS: Unchanged loop recorder. Stable cardiomediastinal silhouette. Atherosclerotic calcification of the aortic arch. Normal pulmonary vascularity. Persistent low lung volumes. No focal consolidation, pleural effusion, or pneumothorax. No acute osseous abnormality. Chronic deformity of the right proximal humerus. IMPRESSION: No active disease. Electronically Signed   By: Titus Dubin M.D.   On: 05/01/2019 11:09   Ct Abdomen Pelvis W Contrast  Result Date: 05/01/2019 CLINICAL DATA:  Increasing jaundice with weakness EXAM: CT ABDOMEN AND PELVIS WITH CONTRAST TECHNIQUE: Multidetector CT imaging of the abdomen and pelvis was performed using the standard protocol following bolus administration of intravenous contrast. CONTRAST:  119mL OMNIPAQUE IOHEXOL 300 MG/ML  SOLN COMPARISON:  None. FINDINGS: Lower chest: Lung bases are free of acute infiltrate or sizable effusion Hepatobiliary: Gallbladder has been surgically removed. There is significant intra and extrahepatic biliary ductal dilatation identified which extends to the level of the pancreatic head where a area of peripheral enhancement with central decreased attenuation likely related to necrosis is identified. This measures 3.4 x 2.6 cm in greatest dimension and is consistent with a pancreatic neoplasm till proven otherwise. Pancreas: 3.4 x 2.6 cm mass lesion with central necrosis identified in the head of the pancreas. Both dilatation of the common bile duct and pancreatic duct is noted and these changes are consistent with pancreatic neoplasm till proven otherwise. The pancreas is somewhat atrophic peripherally. Spleen: Normal in size without focal abnormality. Adrenals/Urinary  Tract: Adrenal glands are within normal limits. Kidneys demonstrate a normal enhancement pattern bilaterally. Excretion of contrast material is noted. Small renal cysts are seen. No obstructive changes are  identified. The bladder is partially distended. Stomach/Bowel: Scattered diverticular change of the colon is noted. No evidence of diverticulitis is seen. The appendix is not well visualized although no inflammatory changes to suggest appendicitis are noted. Vascular/Lymphatic: Atherosclerotic calcifications of the abdominal aorta are noted. No aneurysmal dilatation is seen. Small periaortic lymph nodes are noted not significant by size criteria at this time although suspicious given the adjacent pancreatic mass. Portacaval lymph node is seen which appears contiguous with the mass lesion and may represent some localized extension. This is best visualized on image number 27 of series 3. Reproductive: Uterus has been surgically removed. No adnexal mass is seen. Other: No abdominal wall hernia or abnormality. No abdominopelvic ascites. Musculoskeletal: Degenerative changes of lumbar spine are noted. IMPRESSION: Centrally necrotic mass lesion in the region of the head of the pancreas causing dilatation of the pancreatic and common bile ducts consistent with pancreatic neoplasm till proven otherwise. Scattered lymph nodes are noted in the periaortic region which are suspicious despite their small size given the nearby pancreatic lesion. Additionally a portacaval lymph node is seen which appears contiguous with the mass lesion. Electronically Signed   By: Inez Catalina M.D.   On: 05/01/2019 13:59    EKG: Independently reviewed.  NSR with rate 90; nonspecific ST changes with no evidence of acute ischemia   Labs on Admission: I have personally reviewed the available labs and imaging studies at the time of the admission.  Pertinent labs:   VBG: 7.361/39.8/52 CO2 17 Glucose 154 BUN 25/Creatinine 0.80/GFR >60  Anion gap 17; 12 on 5/14 AP 1519 Albumin 2.4 Lipase 130 AST 472/ALT 111/Bili 12.7; normal on 5/14 Lactate 3.3 WBC 10.9 INR 1.1 TSH 1.662 COVID negative now and 1 month ago  Assessment/Plan Principal Problem:   Primary pancreatic cancer of unknown cell type ([place])   -Patient presenting with painless jaundice, markedly elevated LFTs which were completely normal 6 weeks ago, and imaging concerning for advanced pancreatic malignancy -Overall poor prognosis, particularly when considering her age and the apparent rapidity of onset of signs and symptoms -After discussion with the patient and her family (I spoke via conference call with the daughter and 2 of the 3 sons), all are in agreement with residential hospice -The patient initially preferred discharge back to SNF but the family was clear that her husband died at [facility] and they definitely preferred this for her, as well -After further discussion, the patient is in agreement with this plan -I then called and spoke with the hospice liaison, Erling Conte -There is a bed at [facility] available today -It appears that the paperwork will be able to be completed in a timely manner and that discharge to [facility] will be facilitated this afternoon -I have notified Dr. Alessandra Bevels, who is in agreement with this plan.   Time for evaluation of this plan and coordination with family and hospice >75 minutes.   Note: This patient has been tested and is negative for the novel coronavirus COVID-19.   Karmen Bongo MD [facility]   How to contact the [facility] Attending or Consulting provider [place] or covering provider during after hours [place], for this patient?  1. Check the care team in [facility] and look for a) attending/consulting [facility] provider listed and b) the [facility] team listed 2. Log into [email or web address] and use [place]'s universal password to access. If you do not have the password, please contact the hospital operator. 3. Locate  the [facility] provider you are  looking for under [facility] and page to a number that you can be directly reached. 4. If you still have difficulty reaching the provider, please page the [facility] (Director on Call) for the Hospitalists listed on amion for assistance.   05/01/2019, 5:16 PM

## 2019-05-01 NOTE — ED Provider Notes (Signed)
[facility] EMERGENCY DEPARTMENT Provider Note   CSN: [id number] Arrival date & time: 05/01/19  0109    History   Chief Complaint Chief Complaint  Patient presents with   Weakness   Jaundice    HPI [name] is a 85 y.o. female.     HPI Patient reports she feels drugged and tired.  She reports that she feels very itchy as well.  Her skin has turned yellow.  He does have some discomfort in her right upper quadrant.  She does not qualify it is severe.  She is not really sure how long it has been there.  She endorses feeling slightly short of breath but not notably so.  Denies chest pain or cough.  No fever or vomiting.  Reports she has had a lot of diarrhea.  Not quantify for how long this is been present. Past Medical History:  Diagnosis Date   Abnormality of gait 02/05/2015   Anxiety    Arthritis    Asthma    Cerebrovascular disease    CVA '03   Depression    Dyslipidemia    Endometriosis    GERD (gastroesophageal reflux disease)    Glaucoma    Headache(784.0)    Hypertension    Macular degeneration    bilateral   Memory disorder 11/23/2018   Obesity    Paralysis agitans ([place]) 08/31/2013   Parkinson disease ([place])    TIA (transient ischemic attack)    Multiple    Patient Active Problem List   Diagnosis Date Noted   Icterus 05/01/2019   Yellow skin 05/01/2019   Low blood pressure reading 04/30/2019   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   Dysuria 03/17/2019   Dementia with behavioral problem ([place]) 02/18/2019   Chronic generalized pain 01/29/2019   Chronic recurrent major depressive disorder ([place]) 01/29/2019   Increased intraocular pressure, bilateral 01/29/2019   Dysphagia 12/11/2018   Simple chronic bronchitis ([place]) 12/11/2018   Insomnia 12/02/2018   Memory disorder 11/23/2018   GERD (gastroesophageal reflux disease) 09/05/2018   Type 2 diabetes mellitus with neurological  manifestations, controlled ([place]) 07/03/2018   Depression 07/03/2018   Essential hypertension    Hx of aspiration pneumonitis 06/17/2018   Abnormality of gait 02/05/2015   Palpitations 05/28/2014   Cerebrovascular disease 05/25/2014   Hyperlipidemia associated with type 2 diabetes mellitus ([place]) 05/25/2014   Parkinson disease ([place]) 05/25/2014   H/O: CVA (cerebrovascular accident) 05/25/2014   Paralysis agitans ([place]) 08/31/2013   Dizziness and giddiness 08/31/2013    Past Surgical History:  Procedure Laterality Date   ABDOMINAL HYSTERECTOMY     CATARACT EXTRACTION, BILATERAL     GALLBLADDER SURGERY     LOOP RECORDER IMPLANT  05-28-14   MDT LINQ implanted by Dr Caryl Comes for cryptogenic stroke/palpitations   LOOP RECORDER IMPLANT N/A 05/28/2014   Procedure: LOOP RECORDER IMPLANT;  Surgeon: Deboraha Sprang, MD;  Location: [facility] CATH LAB;  Service: Cardiovascular;  Laterality: N/A;   PILONIDAL CYST RESECTION     TILT TABLE STUDY  05-28-14   orthostatic hypertension   TILT TABLE STUDY N/A 05/28/2014   Procedure: TILT TABLE STUDY;  Surgeon: Deboraha Sprang, MD;  Location: [facility] CATH LAB;  Service: Cardiovascular;  Laterality: N/A;   TONSILLECTOMY       OB History   No obstetric history on file.      Home Medications    Prior to Admission medications   Medication Sig Start Date End Date Taking? Authorizing  Provider  ALPRAZolam (XANAX) 0.25 MG tablet Take 1 tablet (0.25 mg total) by mouth 2 (two) times daily. 04/27/19  Yes Hennie Duos, MD  atorvastatin (LIPITOR) 10 MG tablet Take 10 mg by mouth at bedtime.    Yes [provider]  bisacodyl (DULCOLAX) 10 MG suppository Place 10 mg rectally daily as needed (for constipation not relieved by milk of magnesia).    Yes [provider]  busPIRone (BUSPAR) 10 MG tablet Take 10 mg by mouth 2 (two) times daily.    Yes [provider]  carbidopa-levodopa (SINEMET IR) 25-100 MG tablet Take 1 tablet by mouth  every 8 (eight) hours.    Yes [provider]  clopidogrel (PLAVIX) 75 MG tablet Take 75 mg by mouth daily at 12 noon.    Yes [provider]  diltiazem (CARDIZEM) 60 MG tablet Take 60 mg by mouth every 6 (six) hours.    Yes [provider]  divalproex (DEPAKOTE) 250 MG DR tablet Take 250 mg by mouth 3 (three) times daily.   Yes [provider]  DULoxetine (CYMBALTA) 60 MG capsule Take 60 mg by mouth daily.   Yes [provider]  losartan (COZAAR) 25 MG tablet Take 25 mg by mouth at bedtime.    Yes [provider]  magnesium hydroxide (MILK OF MAGNESIA) 400 MG/5ML suspension Take 30 mLs by mouth daily as needed (if no bowel movement in 3 days).    Yes [provider]  metFORMIN (GLUCOPHAGE) 500 MG tablet Take 500 mg by mouth 2 (two) times daily before a meal.    Yes [provider]  montelukast (SINGULAIR) 10 MG tablet Take 10 mg by mouth daily at 12 noon.    Yes [provider]  Nutritional Supplement LIQD Take 4 oz by mouth daily. [employer]   Yes [provider]  OLANZapine (ZYPREXA) 10 MG tablet Take 10 mg by mouth at bedtime.   Yes [provider]  pantoprazole (PROTONIX) 40 MG tablet Take 40 mg by mouth daily before breakfast.   Yes [provider]  Sodium Phosphates (RA SALINE ENEMA RE) Place 1 enema rectally daily as needed (for constipation not relieved by Dulcolax suppository and contact MD if no relief from enema).    Yes [provider]    Family History Family History  Problem Relation Age of Onset   Heart attack Mother    Arthritis Brother    Skin cancer Brother     Social History Social History   Tobacco Use   Smoking status: Never Smoker   Smokeless tobacco: Never Used  Substance Use Topics   Alcohol use: No   Drug use: No     Allergies   Comtan [entacapone], Mirapex [pramipexole dihydrochloride], Sulfa antibiotics, Ciprofloxacin, and Seroquel  [quetiapine fumarate]   Review of Systems Review of Systems 10 Systems reviewed and are negative for acute change except as noted in the HPI.  Physical Exam Updated Vital Signs BP 120/61    Pulse 77    Temp 97.6 F (36.4 C) (Oral)    Resp (!) 25    SpO2 99%   Physical Exam Constitutional:      Comments: Patient is alert and interactive with me.  She seems slightly drowsy.  She is situationally oriented and answering questions.  He does not require repeated prompting and is not falling asleep during the exam.  Respiration nonlabored.  Patient is clearly jaundiced.  HENT:     Mouth/Throat:  Mouth: Mucous membranes are moist.     Pharynx: Oropharynx is clear.  Eyes:     General: Scleral icterus present.     Extraocular Movements: Extraocular movements intact.     Pupils: Pupils are equal, round, and reactive to light.  Neck:     Musculoskeletal: Neck supple.  Cardiovascular:     Rate and Rhythm: Normal rate and regular rhythm.  Pulmonary:     Effort: Pulmonary effort is normal.     Breath sounds: Normal breath sounds.  Abdominal:     Palpations: Abdomen is soft.     Comments: Abdomen soft.  No significant ascites.  Mild discomfort to palpation of the right upper quadrant.  No guarding.  Musculoskeletal: Normal range of motion.        General: No swelling or tenderness.     Comments: No peripheral edema.  Calves are soft and nontender.  Skin condition of lower legs is good.  Skin:    General: Skin is warm and dry.     Comments: Patient is jaundiced in the face and trunk.  Neurological:     Comments: Patient is alert and appropriate.  She does follow commands appropriately.  No focal motor deficits.  Psychiatric:        Mood and Affect: Mood normal.      ED Treatments / Results  Labs (all labs ordered are listed, but only abnormal results are displayed) Labs Reviewed  COMPREHENSIVE METABOLIC PANEL - Abnormal; Notable for the following components:      Result Value    CO2 17 (*)    Glucose, Bld 154 (*)    BUN 25 (*)    Albumin 2.4 (*)    AST 472 (*)    ALT 111 (*)    Alkaline Phosphatase 1,519 (*)    Total Bilirubin 12.7 (*)    Anion gap 17 (*)    All other components within normal limits  LIPASE, BLOOD - Abnormal; Notable for the following components:   Lipase 130 (*)    All other components within normal limits  LACTIC ACID, PLASMA - Abnormal; Notable for the following components:   Lactic Acid, Venous 3.3 (*)    All other components within normal limits  CBC WITH DIFFERENTIAL/PLATELET - Abnormal; Notable for the following components:   WBC 10.9 (*)    RDW 15.9 (*)    Abs Immature Granulocytes 0.11 (*)    All other components within normal limits  VALPROIC ACID LEVEL - Abnormal; Notable for the following components:   Valproic Acid Lvl 36 (*)    All other components within normal limits  AMMONIA - Abnormal; Notable for the following components:   Ammonia 73 (*)    All other components within normal limits  POCT I-STAT EG7 - Abnormal; Notable for the following components:   pCO2, Ven 39.8 (*)    pO2, Ven 52.0 (*)    Acid-base deficit 3.0 (*)    All other components within normal limits  SARS CORONAVIRUS 2 (HOSPITAL ORDER, [place] [facility])  PROTIME-INR  TSH  MAGNESIUM  PHOSPHORUS  LACTIC ACID, PLASMA  URINALYSIS, ROUTINE W REFLEX MICROSCOPIC  RAPID URINE DRUG SCREEN, HOSP PERFORMED  BLOOD GAS, VENOUS  HEPATITIS PANEL, ACUTE    EKG EKG Interpretation  Date/Time:  Monday May 01 2019 10:05:37 EDT Ventricular Rate:  90 PR Interval:    QRS Duration: 105 QT Interval:  390 QTC Calculation: 478 R Axis:   -69 Text Interpretation:  Sinus  rhythm Inferior infarct, old Consider anterior infarct no sig change from previous Confirmed by Charlesetta Shanks [phone]) on 05/01/2019 3:10:37 PM   Radiology Dg Chest 1 View  Result Date: 05/01/2019 CLINICAL DATA:  Jaundice. EXAM: CHEST  1 VIEW COMPARISON:  Chest x-ray dated  Mar 17, 2019. FINDINGS: Unchanged loop recorder. Stable cardiomediastinal silhouette. Atherosclerotic calcification of the aortic arch. Normal pulmonary vascularity. Persistent low lung volumes. No focal consolidation, pleural effusion, or pneumothorax. No acute osseous abnormality. Chronic deformity of the right proximal humerus. IMPRESSION: No active disease. Electronically Signed   By: Titus Dubin M.D.   On: 05/01/2019 11:09   Ct Abdomen Pelvis W Contrast  Result Date: 05/01/2019 CLINICAL DATA:  Increasing jaundice with weakness EXAM: CT ABDOMEN AND PELVIS WITH CONTRAST TECHNIQUE: Multidetector CT imaging of the abdomen and pelvis was performed using the standard protocol following bolus administration of intravenous contrast. CONTRAST:  127mL OMNIPAQUE IOHEXOL 300 MG/ML  SOLN COMPARISON:  None. FINDINGS: Lower chest: Lung bases are free of acute infiltrate or sizable effusion Hepatobiliary: Gallbladder has been surgically removed. There is significant intra and extrahepatic biliary ductal dilatation identified which extends to the level of the pancreatic head where a area of peripheral enhancement with central decreased attenuation likely related to necrosis is identified. This measures 3.4 x 2.6 cm in greatest dimension and is consistent with a pancreatic neoplasm till proven otherwise. Pancreas: 3.4 x 2.6 cm mass lesion with central necrosis identified in the head of the pancreas. Both dilatation of the common bile duct and pancreatic duct is noted and these changes are consistent with pancreatic neoplasm till proven otherwise. The pancreas is somewhat atrophic peripherally. Spleen: Normal in size without focal abnormality. Adrenals/Urinary Tract: Adrenal glands are within normal limits. Kidneys demonstrate a normal enhancement pattern bilaterally. Excretion of contrast material is noted. Small renal cysts are seen. No obstructive changes are identified. The bladder is partially distended.  Stomach/Bowel: Scattered diverticular change of the colon is noted. No evidence of diverticulitis is seen. The appendix is not well visualized although no inflammatory changes to suggest appendicitis are noted. Vascular/Lymphatic: Atherosclerotic calcifications of the abdominal aorta are noted. No aneurysmal dilatation is seen. Small periaortic lymph nodes are noted not significant by size criteria at this time although suspicious given the adjacent pancreatic mass. Portacaval lymph node is seen which appears contiguous with the mass lesion and may represent some localized extension. This is best visualized on image number 27 of series 3. Reproductive: Uterus has been surgically removed. No adnexal mass is seen. Other: No abdominal wall hernia or abnormality. No abdominopelvic ascites. Musculoskeletal: Degenerative changes of lumbar spine are noted. IMPRESSION: Centrally necrotic mass lesion in the region of the head of the pancreas causing dilatation of the pancreatic and common bile ducts consistent with pancreatic neoplasm till proven otherwise. Scattered lymph nodes are noted in the periaortic region which are suspicious despite their small size given the nearby pancreatic lesion. Additionally a portacaval lymph node is seen which appears contiguous with the mass lesion. Electronically Signed   By: Inez Catalina M.D.   On: 05/01/2019 13:59    Procedures Procedures (including critical care time)  Medications Ordered in ED Medications  0.9 %  sodium chloride infusion ( Intravenous New Bag/Given 05/01/19 1301)  iohexol (OMNIPAQUE) 300 MG/ML solution 100 mL (100 mLs Intravenous Contrast Given 05/01/19 1327)     Initial Impression / Assessment and Plan / ED Course  I have reviewed the triage vital signs and the nursing notes.  Pertinent labs &  imaging results that were available during my care of the patient were reviewed by me and considered in my medical decision making (see chart for  details).  Clinical Course as of May 01 1511  Mon May 01, 2019  1316 Dr. Alessandra Bevels GI has been consulted.  Will evaluate the patient consultation.   [MP]  1511 Bicarbonate: 22.5 [MP]    Clinical Course User Index [MP] Charlesetta Shanks, MD      Consult: Dr. Lorin Mercy for admission.  Patient presents with new onset of jaundice.  CT is suggestive of pancreatic neoplasm.  Patient is alert and appropriate at this time.  Plan is for admission.  Final Clinical Impressions(s) / ED Diagnoses   Final diagnoses:  Jaundice  Malignant neoplasm of pancreas, unspecified location of malignancy [facility])    ED Discharge Orders    None       Charlesetta Shanks, MD 05/01/19 1512

## 2019-05-01 NOTE — Consult Note (Signed)
Referring Provider:  EDP Primary Care Physician:  Hennie Duos, MD Primary Gastroenterologist: Althia Forts  Reason for Consultation: Abnormal LFTs  HPI: [name] is a 85 y.o. female with past medical history of CVA currently on Plavix, history of Parkinson's disease was brought into the hospital for jaundice of 1 week duration.  Patient has been complaining of worsening itching for last 7 to 10 days along with discoloration of the skin and urine.  Also complaining of 15 to 20 pound weight loss and decreased appetite for last 1 to 2 weeks.  Complaining of loose stools but denies any blood in the stool.  Denies nausea vomiting.  Also complaining of mild right upper quadrant discomfort for 1 week duration.  Initial blood work showed significantly elevated total bilirubin of 12.7 with alkaline phosphatase of 1519, AST 472 and ALT 111.  Her LFTs were normal last month.  GI was consulted for further evaluation.  Abdominal imaging was requested.   CT abdomen pelvis with IV contrast showed 3.4 x 2.6 cm mass lesion with central necrosis in the pancreatic head with dilation of common bile duct and pancreatic duct, suspicious for pancreatic neoplasm until proven otherwise.  Past Medical History:  Diagnosis Date  . Abnormality of gait 02/05/2015  . Anxiety   . Arthritis   . Asthma   . Cerebrovascular disease    CVA '03  . Depression   . Dyslipidemia   . Endometriosis   . GERD (gastroesophageal reflux disease)   . Glaucoma   . Headache(784.0)   . Hypertension   . Macular degeneration    bilateral  . Memory disorder 11/23/2018  . Obesity   . Paralysis agitans ([place]) 08/31/2013  . Parkinson disease ([place])   . TIA (transient ischemic attack)    Multiple    Past Surgical History:  Procedure Laterality Date  . ABDOMINAL HYSTERECTOMY    . CATARACT EXTRACTION, BILATERAL    . GALLBLADDER SURGERY    . LOOP RECORDER IMPLANT  05-28-14   MDT LINQ implanted by Dr Caryl Comes for cryptogenic  stroke/palpitations  . LOOP RECORDER IMPLANT N/A 05/28/2014   Procedure: LOOP RECORDER IMPLANT;  Surgeon: Deboraha Sprang, MD;  Location: [facility] CATH LAB;  Service: Cardiovascular;  Laterality: N/A;  . PILONIDAL CYST RESECTION    . TILT TABLE STUDY  05-28-14   orthostatic hypertension  . TILT TABLE STUDY N/A 05/28/2014   Procedure: TILT TABLE STUDY;  Surgeon: Deboraha Sprang, MD;  Location: [facility] CATH LAB;  Service: Cardiovascular;  Laterality: N/A;  . TONSILLECTOMY      Prior to Admission medications   Medication Sig Start Date End Date Taking? Authorizing Provider  ALPRAZolam (XANAX) 0.25 MG tablet Take 1 tablet (0.25 mg total) by mouth 2 (two) times daily. 04/27/19  Yes Hennie Duos, MD  atorvastatin (LIPITOR) 10 MG tablet Take 10 mg by mouth at bedtime.    Yes [provider]  bisacodyl (DULCOLAX) 10 MG suppository Place 10 mg rectally daily as needed (for constipation not relieved by milk of magnesia).    Yes [provider]  busPIRone (BUSPAR) 10 MG tablet Take 10 mg by mouth 2 (two) times daily.    Yes [provider]  carbidopa-levodopa (SINEMET IR) 25-100 MG tablet Take 1 tablet by mouth every 8 (eight) hours.    Yes [provider]  clopidogrel (PLAVIX) 75 MG tablet Take 75 mg by mouth daily at 12 noon.    Yes [provider]  diltiazem (CARDIZEM) 60  MG tablet Take 60 mg by mouth every 6 (six) hours.    Yes [provider]  divalproex (DEPAKOTE) 250 MG DR tablet Take 250 mg by mouth 3 (three) times daily.   Yes [provider]  DULoxetine (CYMBALTA) 60 MG capsule Take 60 mg by mouth daily.   Yes [provider]  losartan (COZAAR) 25 MG tablet Take 25 mg by mouth at bedtime.    Yes [provider]  magnesium hydroxide (MILK OF MAGNESIA) 400 MG/5ML suspension Take 30 mLs by mouth daily as needed (if no bowel movement in 3 days).    Yes [provider]  metFORMIN (GLUCOPHAGE) 500 MG tablet Take 500 mg by  mouth 2 (two) times daily before a meal.    Yes [provider]  montelukast (SINGULAIR) 10 MG tablet Take 10 mg by mouth daily at 12 noon.    Yes [provider]  Nutritional Supplement LIQD Take 4 oz by mouth daily. MedPass   Yes [provider]  OLANZapine (ZYPREXA) 10 MG tablet Take 10 mg by mouth at bedtime.   Yes [provider]  pantoprazole (PROTONIX) 40 MG tablet Take 40 mg by mouth daily before breakfast.   Yes [provider]  Sodium Phosphates (RA SALINE ENEMA RE) Place 1 enema rectally daily as needed (for constipation not relieved by Dulcolax suppository and contact MD if no relief from enema).    Yes [provider]    Scheduled Meds: Continuous Infusions: PRN Meds:.  Allergies as of 05/01/2019 - Review Complete 05/01/2019  Allergen Reaction Noted  . Comtan [entacapone] Other (See Comments) 08/25/2013  . Mirapex [pramipexole dihydrochloride] Other (See Comments) 08/25/2013  . Sulfa antibiotics Nausea And Vomiting 08/25/2013  . Ciprofloxacin Other (See Comments) 08/22/2015  . Seroquel [quetiapine fumarate] Other (See Comments) 11/24/2018    Family History  Problem Relation Age of Onset  . Heart attack Mother   . Arthritis Brother   . Skin cancer Brother     Social History   Socioeconomic History  . Marital status: Widowed    Spouse name: Not on file  . Number of children: 4  . Years of education: HS  . Highest education level: Not on file  Occupational History  . Occupation: Retired  Scientific laboratory technician  . Financial resource strain: Not hard at all  . Food insecurity    Worry: Never true    Inability: Never true  . Transportation needs    Medical: No    Non-medical: No  Tobacco Use  . Smoking status: Never Smoker  . Smokeless tobacco: Never Used  Substance and Sexual Activity  . Alcohol use: No  . Drug use: No  . Sexual activity: Never    Birth control/protection: Other-see comments  Lifestyle  .  Physical activity    Days per week: 0 days    Minutes per session: 0 min  . Stress: Not on file  Relationships  . Social Herbalist on phone: Not on file    Gets together: Not on file    Attends religious service: Not on file    Active member of club or organization: Not on file    Attends meetings of clubs or organizations: Not on file    Relationship status: Not on file  . Intimate partner violence    Fear of current or ex partner: Not on file    Emotionally abused: Not on file    Physically abused: Not on file  Forced sexual activity: Not on file  Other Topics Concern  . Not on file  Social History Narrative   Lived at [place] prior to admission to [facility]   Patient is right handed.   Caffeine use: Tea daily    Review of Systems: Review of Systems  Constitutional: Positive for malaise/fatigue and weight loss. Negative for chills and fever.  HENT: Negative for hearing loss and tinnitus.   Eyes: Negative for blurred vision and double vision.  Respiratory: Positive for shortness of breath. Negative for cough and hemoptysis.   Cardiovascular: Negative for chest pain and palpitations.  Gastrointestinal: Positive for abdominal pain and diarrhea. Negative for blood in stool, constipation, heartburn, nausea and vomiting.  Genitourinary: Negative for dysuria and urgency.  Musculoskeletal: Positive for joint pain. Negative for myalgias.  Skin: Positive for itching.  Neurological: Negative for seizures and loss of consciousness.  Endo/Heme/Allergies: Does not bruise/bleed easily.  Psychiatric/Behavioral: Negative for hallucinations and substance abuse.    Physical Exam: Vital signs: Vitals:   05/01/19 1145 05/01/19 1245  BP: 122/62 120/61  Pulse: 78 77  Resp: (!) 25 (!) 25  Temp:    SpO2: 95% 99%     Physical Exam  Constitutional: She is oriented to person, place, and time. She appears well-nourished. No distress.  HENT:  Head: Normocephalic and  atraumatic.  Eyes: EOM are normal. Scleral icterus is present.  Neck: Neck supple.  Cardiovascular: Normal rate and regular rhythm.  Murmur heard. Pulmonary/Chest: Effort normal and breath sounds normal. No respiratory distress.  Anterior exam only  Abdominal: Soft. Bowel sounds are normal. She exhibits no distension. There is abdominal tenderness. There is no rebound and no guarding.  Mild right upper quadrant discomfort on palpation, bowel sounds present, no peritoneal signs  Musculoskeletal: Normal range of motion.        General: No edema.  Neurological: She is alert and oriented to person, place, and time.  Skin: Skin is warm. No erythema.  Psychiatric: She has a normal mood and affect. Thought content normal.  Vitals reviewed.   GI:  Lab Results: Recent Labs    05/01/19 1043 05/01/19 1120  WBC 10.9*  --   HGB 12.1 13.3  HCT 37.7 39.0  PLT 382  --    BMET Recent Labs    05/01/19 1043 05/01/19 1120  NA 135 138  K 3.6 3.5  CL 101  --   CO2 17*  --   GLUCOSE 154*  --   BUN 25*  --   CREATININE 0.80  --   CALCIUM 9.3  --    LFT Recent Labs    05/01/19 1043  PROT 7.0  ALBUMIN 2.4*  AST 472*  ALT 111*  ALKPHOS 1,519*  BILITOT 12.7*   PT/INR Recent Labs    05/01/19 1043  LABPROT 14.3  INR 1.1     Studies/Results: Dg Chest 1 View  Result Date: 05/01/2019 CLINICAL DATA:  Jaundice. EXAM: CHEST  1 VIEW COMPARISON:  Chest x-ray dated Mar 17, 2019. FINDINGS: Unchanged loop recorder. Stable cardiomediastinal silhouette. Atherosclerotic calcification of the aortic arch. Normal pulmonary vascularity. Persistent low lung volumes. No focal consolidation, pleural effusion, or pneumothorax. No acute osseous abnormality. Chronic deformity of the right proximal humerus. IMPRESSION: No active disease. Electronically Signed   By: Titus Dubin M.D.   On: 05/01/2019 11:09   Ct Abdomen Pelvis W Contrast  Result Date: 05/01/2019 CLINICAL DATA:  Increasing jaundice  with weakness EXAM: CT ABDOMEN AND PELVIS  WITH CONTRAST TECHNIQUE: Multidetector CT imaging of the abdomen and pelvis was performed using the standard protocol following bolus administration of intravenous contrast. CONTRAST:  182mL OMNIPAQUE IOHEXOL 300 MG/ML  SOLN COMPARISON:  None. FINDINGS: Lower chest: Lung bases are free of acute infiltrate or sizable effusion Hepatobiliary: Gallbladder has been surgically removed. There is significant intra and extrahepatic biliary ductal dilatation identified which extends to the level of the pancreatic head where a area of peripheral enhancement with central decreased attenuation likely related to necrosis is identified. This measures 3.4 x 2.6 cm in greatest dimension and is consistent with a pancreatic neoplasm till proven otherwise. Pancreas: 3.4 x 2.6 cm mass lesion with central necrosis identified in the head of the pancreas. Both dilatation of the common bile duct and pancreatic duct is noted and these changes are consistent with pancreatic neoplasm till proven otherwise. The pancreas is somewhat atrophic peripherally. Spleen: Normal in size without focal abnormality. Adrenals/Urinary Tract: Adrenal glands are within normal limits. Kidneys demonstrate a normal enhancement pattern bilaterally. Excretion of contrast material is noted. Small renal cysts are seen. No obstructive changes are identified. The bladder is partially distended. Stomach/Bowel: Scattered diverticular change of the colon is noted. No evidence of diverticulitis is seen. The appendix is not well visualized although no inflammatory changes to suggest appendicitis are noted. Vascular/Lymphatic: Atherosclerotic calcifications of the abdominal aorta are noted. No aneurysmal dilatation is seen. Small periaortic lymph nodes are noted not significant by size criteria at this time although suspicious given the adjacent pancreatic mass. Portacaval lymph node is seen which appears contiguous with the mass  lesion and may represent some localized extension. This is best visualized on image number 27 of series 3. Reproductive: Uterus has been surgically removed. No adnexal mass is seen. Other: No abdominal wall hernia or abnormality. No abdominopelvic ascites. Musculoskeletal: Degenerative changes of lumbar spine are noted. IMPRESSION: Centrally necrotic mass lesion in the region of the head of the pancreas causing dilatation of the pancreatic and common bile ducts consistent with pancreatic neoplasm till proven otherwise. Scattered lymph nodes are noted in the periaortic region which are suspicious despite their small size given the nearby pancreatic lesion. Additionally a portacaval lymph node is seen which appears contiguous with the mass lesion. Electronically Signed   By: Inez Catalina M.D.   On: 05/01/2019 13:59    Impression/Plan: -Obstructive jaundice.  CT scan showing 3.4 cm pancreatic head mass with CBD and PD dilation.  Concerning for pancreatic neoplasm. -History of CVA.  Was on Plavix. -Parkinson's disease  Recommendations ------------------------ -Finding concerning for pancreatic neoplasm.  May need palliative biliary stent placement for biliary drainage.  I will discuss with Dr. Watt Climes tomorrow.  -Hold Plavix for now. -Called and discussed with patient's daughter over the phone.  Approximately 9 minutes spent over the phone.  She is not sure about future care plan.  They may consider comfort care/hospice.  She will need to discuss with her brother Shanon Brow and she will update [place] tomorrow. -Okay to do diet from GI standpoint.  GI will follow tomorrow.    LOS: 0 days   Otis Brace  MD, FACP 05/01/2019, 2:08 PM  Contact #  [phone]

## 2019-05-01 NOTE — Progress Notes (Signed)
[facility]  Received request from Dr. Lorin Mercy to transfer patient to [facility]. Chart reviewed and spoke with family by phone. Eligibility confirmed and paper work completed for transfer to [facility] today. Spoke with RN and provided number to call report. Number to fax discharge paper work is [phone].   Thank you for your help getting patient to [facility].   Erling Conte, [place]

## 2019-05-01 NOTE — ED Notes (Signed)
Dr. Yates in to assess pt for admission 

## 2019-05-02 LAB — HEPATITIS PANEL, ACUTE
HCV Ab: <0.1 s/co ratio (ref 0.0–0.9)
Hep A IgM: NEGATIVE
Hep B C IgM: NEGATIVE
Hepatitis B Surface Ag: NEGATIVE

## 2019-05-02 NOTE — Telephone Encounter (Signed)
Pt is already in hospital when you sent call over and she has a diagnosis and is going to [facility]. You can call daughter to find out if she needs anything now.- I'm sure her concerns have probabaly all been answered.

## 2019-05-24 ENCOUNTER — Ambulatory Visit: Payer: Medicare Other | Admitting: Neurology

## 2019-07-04 DEATH — deceased
